# Patient Record
Sex: Female | Born: 1950 | Hispanic: Yes | Marital: Single | Smoking: Never smoker
Health system: Southern US, Community
[De-identification: ages and names within clinical notes are randomized; demographics above are authoritative.]

## PROBLEM LIST (undated history)

## (undated) DIAGNOSIS — E119 Type 2 diabetes mellitus without complications: Secondary | ICD-10-CM

## (undated) DIAGNOSIS — J9611 Chronic respiratory failure with hypoxia: Secondary | ICD-10-CM

## (undated) DIAGNOSIS — I776 Arteritis, unspecified: Secondary | ICD-10-CM

## (undated) DIAGNOSIS — I7782 Antineutrophilic cytoplasmic antibody (ANCA) vasculitis: Secondary | ICD-10-CM

## (undated) DIAGNOSIS — N189 Chronic kidney disease, unspecified: Secondary | ICD-10-CM

## (undated) HISTORY — DX: Chronic kidney disease, unspecified: N18.9

## (undated) HISTORY — DX: Arteritis, unspecified: I77.6

## (undated) HISTORY — DX: Antineutrophilic cytoplasmic antibody (ANCA) vasculitis: I77.82

## (undated) HISTORY — DX: Chronic respiratory failure with hypoxia: J96.11

## (undated) HISTORY — DX: Type 2 diabetes mellitus without complications: E11.9

---

## 2017-07-03 DIAGNOSIS — K625 Hemorrhage of anus and rectum: Secondary | ICD-10-CM | POA: Insufficient documentation

## 2017-07-03 DIAGNOSIS — K641 Second degree hemorrhoids: Secondary | ICD-10-CM

## 2017-07-03 HISTORY — DX: Second degree hemorrhoids: K64.1

## 2017-07-04 LAB — HM COLONOSCOPY

## 2018-02-09 DIAGNOSIS — R05 Cough: Secondary | ICD-10-CM | POA: Diagnosis not present

## 2018-03-06 DIAGNOSIS — Z9181 History of falling: Secondary | ICD-10-CM | POA: Diagnosis not present

## 2018-03-06 DIAGNOSIS — Z23 Encounter for immunization: Secondary | ICD-10-CM | POA: Diagnosis not present

## 2018-03-06 DIAGNOSIS — J019 Acute sinusitis, unspecified: Secondary | ICD-10-CM | POA: Diagnosis not present

## 2018-06-02 DIAGNOSIS — R188 Other ascites: Secondary | ICD-10-CM | POA: Diagnosis not present

## 2018-06-02 DIAGNOSIS — R10812 Left upper quadrant abdominal tenderness: Secondary | ICD-10-CM | POA: Diagnosis not present

## 2018-06-02 DIAGNOSIS — R10811 Right upper quadrant abdominal tenderness: Secondary | ICD-10-CM | POA: Diagnosis not present

## 2018-06-03 DIAGNOSIS — K760 Fatty (change of) liver, not elsewhere classified: Secondary | ICD-10-CM | POA: Diagnosis not present

## 2018-06-03 DIAGNOSIS — R188 Other ascites: Secondary | ICD-10-CM | POA: Diagnosis not present

## 2018-06-03 DIAGNOSIS — R109 Unspecified abdominal pain: Secondary | ICD-10-CM | POA: Diagnosis not present

## 2018-06-03 DIAGNOSIS — R10811 Right upper quadrant abdominal tenderness: Secondary | ICD-10-CM | POA: Diagnosis not present

## 2018-06-03 DIAGNOSIS — J90 Pleural effusion, not elsewhere classified: Secondary | ICD-10-CM | POA: Diagnosis not present

## 2018-06-03 DIAGNOSIS — R911 Solitary pulmonary nodule: Secondary | ICD-10-CM | POA: Diagnosis not present

## 2018-06-03 DIAGNOSIS — R10812 Left upper quadrant abdominal tenderness: Secondary | ICD-10-CM | POA: Diagnosis not present

## 2018-06-05 DIAGNOSIS — R109 Unspecified abdominal pain: Secondary | ICD-10-CM | POA: Diagnosis not present

## 2018-06-05 DIAGNOSIS — J90 Pleural effusion, not elsewhere classified: Secondary | ICD-10-CM | POA: Diagnosis not present

## 2018-06-05 DIAGNOSIS — R918 Other nonspecific abnormal finding of lung field: Secondary | ICD-10-CM | POA: Diagnosis not present

## 2018-06-05 DIAGNOSIS — R1084 Generalized abdominal pain: Secondary | ICD-10-CM | POA: Diagnosis not present

## 2018-06-05 DIAGNOSIS — R0789 Other chest pain: Secondary | ICD-10-CM | POA: Diagnosis not present

## 2018-06-05 DIAGNOSIS — D1809 Hemangioma of other sites: Secondary | ICD-10-CM | POA: Diagnosis not present

## 2018-06-05 DIAGNOSIS — R112 Nausea with vomiting, unspecified: Secondary | ICD-10-CM | POA: Diagnosis not present

## 2018-06-05 DIAGNOSIS — M546 Pain in thoracic spine: Secondary | ICD-10-CM | POA: Diagnosis not present

## 2018-06-05 DIAGNOSIS — R079 Chest pain, unspecified: Secondary | ICD-10-CM | POA: Diagnosis not present

## 2018-06-09 ENCOUNTER — Other Ambulatory Visit: Payer: Self-pay

## 2018-06-09 ENCOUNTER — Inpatient Hospital Stay (HOSPITAL_COMMUNITY)
Admission: AD | Admit: 2018-06-09 | Discharge: 2018-06-26 | DRG: 871 | Disposition: A | Discharge status: Home Health | Payer: Medicare HMO | Source: Other Acute Inpatient Hospital | Attending: Internal Medicine | Admitting: Internal Medicine

## 2018-06-09 ENCOUNTER — Encounter (HOSPITAL_COMMUNITY): Payer: Self-pay

## 2018-06-09 DIAGNOSIS — R609 Edema, unspecified: Secondary | ICD-10-CM | POA: Diagnosis not present

## 2018-06-09 DIAGNOSIS — R600 Localized edema: Secondary | ICD-10-CM

## 2018-06-09 DIAGNOSIS — R918 Other nonspecific abnormal finding of lung field: Secondary | ICD-10-CM | POA: Diagnosis not present

## 2018-06-09 DIAGNOSIS — J40 Bronchitis, not specified as acute or chronic: Secondary | ICD-10-CM | POA: Diagnosis not present

## 2018-06-09 DIAGNOSIS — K5900 Constipation, unspecified: Secondary | ICD-10-CM | POA: Diagnosis not present

## 2018-06-09 DIAGNOSIS — R52 Pain, unspecified: Secondary | ICD-10-CM | POA: Diagnosis not present

## 2018-06-09 DIAGNOSIS — Z880 Allergy status to penicillin: Secondary | ICD-10-CM

## 2018-06-09 DIAGNOSIS — Z6832 Body mass index (BMI) 32.0-32.9, adult: Secondary | ICD-10-CM | POA: Diagnosis not present

## 2018-06-09 DIAGNOSIS — K760 Fatty (change of) liver, not elsewhere classified: Secondary | ICD-10-CM | POA: Diagnosis present

## 2018-06-09 DIAGNOSIS — T502X5A Adverse effect of carbonic-anhydrase inhibitors, benzothiadiazides and other diuretics, initial encounter: Secondary | ICD-10-CM | POA: Diagnosis not present

## 2018-06-09 DIAGNOSIS — A419 Sepsis, unspecified organism: Principal | ICD-10-CM | POA: Diagnosis present

## 2018-06-09 DIAGNOSIS — I5031 Acute diastolic (congestive) heart failure: Secondary | ICD-10-CM | POA: Diagnosis not present

## 2018-06-09 DIAGNOSIS — K5730 Diverticulosis of large intestine without perforation or abscess without bleeding: Secondary | ICD-10-CM | POA: Diagnosis not present

## 2018-06-09 DIAGNOSIS — J181 Lobar pneumonia, unspecified organism: Secondary | ICD-10-CM | POA: Diagnosis present

## 2018-06-09 DIAGNOSIS — R590 Localized enlarged lymph nodes: Secondary | ICD-10-CM | POA: Diagnosis not present

## 2018-06-09 DIAGNOSIS — M5489 Other dorsalgia: Secondary | ICD-10-CM | POA: Diagnosis not present

## 2018-06-09 DIAGNOSIS — R140 Abdominal distension (gaseous): Secondary | ICD-10-CM | POA: Diagnosis not present

## 2018-06-09 DIAGNOSIS — J9601 Acute respiratory failure with hypoxia: Secondary | ICD-10-CM | POA: Diagnosis present

## 2018-06-09 DIAGNOSIS — R509 Fever, unspecified: Secondary | ICD-10-CM | POA: Diagnosis not present

## 2018-06-09 DIAGNOSIS — R911 Solitary pulmonary nodule: Secondary | ICD-10-CM | POA: Diagnosis present

## 2018-06-09 DIAGNOSIS — R042 Hemoptysis: Secondary | ICD-10-CM | POA: Diagnosis not present

## 2018-06-09 DIAGNOSIS — R51 Headache: Secondary | ICD-10-CM | POA: Diagnosis not present

## 2018-06-09 DIAGNOSIS — R21 Rash and other nonspecific skin eruption: Secondary | ICD-10-CM | POA: Diagnosis not present

## 2018-06-09 DIAGNOSIS — R222 Localized swelling, mass and lump, trunk: Secondary | ICD-10-CM

## 2018-06-09 DIAGNOSIS — E6609 Other obesity due to excess calories: Secondary | ICD-10-CM | POA: Diagnosis not present

## 2018-06-09 DIAGNOSIS — R0602 Shortness of breath: Secondary | ICD-10-CM | POA: Diagnosis not present

## 2018-06-09 DIAGNOSIS — G062 Extradural and subdural abscess, unspecified: Secondary | ICD-10-CM | POA: Diagnosis not present

## 2018-06-09 DIAGNOSIS — G959 Disease of spinal cord, unspecified: Secondary | ICD-10-CM | POA: Diagnosis not present

## 2018-06-09 DIAGNOSIS — M549 Dorsalgia, unspecified: Secondary | ICD-10-CM | POA: Diagnosis not present

## 2018-06-09 DIAGNOSIS — E669 Obesity, unspecified: Secondary | ICD-10-CM | POA: Diagnosis not present

## 2018-06-09 DIAGNOSIS — Z419 Encounter for procedure for purposes other than remedying health state, unspecified: Secondary | ICD-10-CM

## 2018-06-09 DIAGNOSIS — J209 Acute bronchitis, unspecified: Secondary | ICD-10-CM | POA: Diagnosis not present

## 2018-06-09 DIAGNOSIS — R0902 Hypoxemia: Secondary | ICD-10-CM | POA: Diagnosis not present

## 2018-06-09 DIAGNOSIS — R238 Other skin changes: Secondary | ICD-10-CM | POA: Diagnosis not present

## 2018-06-09 DIAGNOSIS — E079 Disorder of thyroid, unspecified: Secondary | ICD-10-CM | POA: Diagnosis not present

## 2018-06-09 DIAGNOSIS — Z789 Other specified health status: Secondary | ICD-10-CM | POA: Diagnosis not present

## 2018-06-09 DIAGNOSIS — K529 Noninfective gastroenteritis and colitis, unspecified: Secondary | ICD-10-CM | POA: Diagnosis not present

## 2018-06-09 DIAGNOSIS — R0600 Dyspnea, unspecified: Secondary | ICD-10-CM | POA: Diagnosis not present

## 2018-06-09 DIAGNOSIS — R197 Diarrhea, unspecified: Secondary | ICD-10-CM | POA: Diagnosis not present

## 2018-06-09 DIAGNOSIS — R6520 Severe sepsis without septic shock: Secondary | ICD-10-CM | POA: Diagnosis not present

## 2018-06-09 DIAGNOSIS — R740 Nonspecific elevation of levels of transaminase and lactic acid dehydrogenase [LDH]: Secondary | ICD-10-CM | POA: Diagnosis not present

## 2018-06-09 DIAGNOSIS — R1030 Lower abdominal pain, unspecified: Secondary | ICD-10-CM | POA: Diagnosis not present

## 2018-06-09 DIAGNOSIS — R011 Cardiac murmur, unspecified: Secondary | ICD-10-CM | POA: Diagnosis not present

## 2018-06-09 DIAGNOSIS — B371 Pulmonary candidiasis: Secondary | ICD-10-CM | POA: Diagnosis not present

## 2018-06-09 DIAGNOSIS — E873 Alkalosis: Secondary | ICD-10-CM | POA: Diagnosis not present

## 2018-06-09 DIAGNOSIS — G061 Intraspinal abscess and granuloma: Secondary | ICD-10-CM | POA: Diagnosis not present

## 2018-06-09 DIAGNOSIS — J918 Pleural effusion in other conditions classified elsewhere: Secondary | ICD-10-CM | POA: Diagnosis not present

## 2018-06-09 DIAGNOSIS — J9611 Chronic respiratory failure with hypoxia: Secondary | ICD-10-CM

## 2018-06-09 DIAGNOSIS — I5022 Chronic systolic (congestive) heart failure: Secondary | ICD-10-CM

## 2018-06-09 DIAGNOSIS — D72829 Elevated white blood cell count, unspecified: Secondary | ICD-10-CM | POA: Diagnosis not present

## 2018-06-09 DIAGNOSIS — R109 Unspecified abdominal pain: Secondary | ICD-10-CM

## 2018-06-09 DIAGNOSIS — R079 Chest pain, unspecified: Secondary | ICD-10-CM | POA: Diagnosis not present

## 2018-06-09 DIAGNOSIS — R846 Abnormal cytological findings in specimens from respiratory organs and thorax: Secondary | ICD-10-CM | POA: Diagnosis not present

## 2018-06-09 LAB — COMPREHENSIVE METABOLIC PANEL
ALT: 114 U/L — ABNORMAL HIGH (ref 0–44)
AST: 47 U/L — ABNORMAL HIGH (ref 15–41)
Albumin: 2.6 g/dL — ABNORMAL LOW (ref 3.5–5.0)
Alkaline Phosphatase: 126 U/L (ref 38–126)
Anion gap: 8 (ref 5–15)
BUN: <5 mg/dL — ABNORMAL LOW (ref 8–23)
CO2: 25 mmol/L (ref 22–32)
Calcium: 8.5 mg/dL — ABNORMAL LOW (ref 8.9–10.3)
Chloride: 102 mmol/L (ref 98–111)
Creatinine, Ser: 0.54 mg/dL (ref 0.44–1.00)
GFR calc Af Amer: >60 mL/min (ref >60)
GFR calc non Af Amer: >60 mL/min (ref >60)
Glucose, Bld: 150 mg/dL — ABNORMAL HIGH (ref 70–99)
Potassium: 3.9 mmol/L (ref 3.5–5.1)
Sodium: 135 mmol/L (ref 135–145)
Total Bilirubin: 0.4 mg/dL (ref 0.3–1.2)
Total Protein: 6.7 g/dL (ref 6.5–8.1)

## 2018-06-09 LAB — MRSA PCR SCREENING: MRSA by PCR: NEGATIVE

## 2018-06-09 MED ORDER — ZOLPIDEM TARTRATE 5 MG PO TABS
5.0000 mg | ORAL_TABLET | Freq: Every evening | ORAL | Status: DC | PRN
  Administered 2018-06-14 – 2018-06-17 (×2): 5 mg via ORAL
  Filled 2018-06-09 (×3): qty 1

## 2018-06-09 MED ORDER — ACETAMINOPHEN 325 MG PO TABS
650.0000 mg | ORAL_TABLET | Freq: Four times a day (QID) | ORAL | Status: DC | PRN
  Administered 2018-06-09 – 2018-06-22 (×8): 650 mg via ORAL
  Filled 2018-06-09 (×9): qty 2

## 2018-06-09 MED ORDER — LACTATED RINGERS IV SOLN
INTRAVENOUS | Status: DC
  Administered 2018-06-09 – 2018-06-12 (×4): via INTRAVENOUS

## 2018-06-09 MED ORDER — DOCUSATE SODIUM 100 MG PO CAPS
100.0000 mg | ORAL_CAPSULE | Freq: Two times a day (BID) | ORAL | Status: DC
  Administered 2018-06-09: 100 mg via ORAL
  Filled 2018-06-09: qty 1

## 2018-06-09 MED ORDER — SODIUM CHLORIDE 0.9% FLUSH
3.0000 mL | Freq: Two times a day (BID) | INTRAVENOUS | Status: DC
  Administered 2018-06-09 – 2018-06-26 (×32): 3 mL via INTRAVENOUS

## 2018-06-09 MED ORDER — ONDANSETRON HCL 4 MG/2ML IJ SOLN
4.0000 mg | Freq: Four times a day (QID) | INTRAMUSCULAR | Status: DC | PRN
  Administered 2018-06-10 – 2018-06-21 (×5): 4 mg via INTRAVENOUS
  Filled 2018-06-09 (×5): qty 2

## 2018-06-09 MED ORDER — ACETAMINOPHEN 650 MG RE SUPP: 650.0000 mg | Freq: Four times a day (QID) | RECTAL | Status: DC | PRN

## 2018-06-09 MED ORDER — ONDANSETRON HCL 4 MG PO TABS
4.0000 mg | ORAL_TABLET | Freq: Four times a day (QID) | ORAL | Status: DC | PRN
  Filled 2018-06-09: qty 1

## 2018-06-09 MED ORDER — OXYCODONE HCL 5 MG PO TABS
5.0000 mg | ORAL_TABLET | ORAL | Status: DC | PRN
  Administered 2018-06-09 – 2018-06-21 (×22): 5 mg via ORAL
  Filled 2018-06-09 (×25): qty 1

## 2018-06-09 NOTE — Progress Notes (Signed)
Dr. Kennon Holter on call for [facility] and text sent to see if a CMP can be done tonight because it is needed for the MRI.

## 2018-06-09 NOTE — H&P (Signed)
History and Physical    Julia Johns GYB:[id number] DOB: December 10, 1950 DOA: 06/09/2018  PCP: No primary care provider on file.  Patient coming from:   Home; NOK: Margarita Grizzle, [phone]  Chief Complaint: SOB  HPI: Julia Johns is a 68 y.o. female without significant medical history presenting with SOB, blood-tinged sputum, CP as a transfer from [facility].   She saw her PCP on last Monday.  She presented to [facility] 4 days ago for these symptoms and evaluation including MRI of the T-spine showed a paraspinal soft tissue thickening T6-10 concerning for malignancy.  Teleneurology was consulted and the patient was recommended to be admitted with plan for consults at that time with ortho, neurosurgery, and ID; MRI spine; PET scan.  The patient declined to stay and requested outpatient f/u.  Now with worsening SOB, does not feel like she can manage this outpatient and so she returned to [facility].  Vitals 89% on RA, improved to 93% on 2L [place] O2.  PE unremarkable at [facility].  Labs unremarkable other than CRP 180 (normal <10 on their scale).  Pre-BNP 142 -> 981.  Negative troponin, stable EKG.  CTA negative for PE but "prevertebral densities" - malignancy vs. infection - and LLL density that is 4.5 x 2.9 cm concerning for mass with surrounding infiltrate.  Hospitalist at [facility] was concerned about blood-tinged sputum and recommended transfer to [facility].  Repeat COVID test negative (initial was also negative).  The patient reports significant pain in the left thoracic back region and midsternal chest tightness.  She has SOB and cough with hemoptysis.  No apparent fever.  No tobacco history.   Review of Systems: As per HPI; otherwise review of systems reviewed and negative.   Ambulatory Status:  Ambulates without assistance  History reviewed. No pertinent past medical history.  History reviewed. No pertinent surgical history.  Social History   Socioeconomic History  . Marital status: Unknown    Spouse  name: Not on file  . Number of children: Not on file  . Years of education: Not on file  . Highest education level: Not on file  Occupational History  . Not on file  Social Needs  . Financial resource strain: Not on file  . Food insecurity:    Worry: Not on file    Inability: Not on file  . Transportation needs:    Medical: Not on file    Non-medical: Not on file  Tobacco Use  . Smoking status: Never Smoker  . Smokeless tobacco: Never Used  Substance and Sexual Activity  . Alcohol use: Never    Frequency: Never  . Drug use: Never  . Sexual activity: Not on file  Lifestyle  . Physical activity:    Days per week: Not on file    Minutes per session: Not on file  . Stress: Not on file  Relationships  . Social connections:    Talks on phone: Not on file    Gets together: Not on file    Attends religious service: Not on file    Active member of club or organization: Not on file    Attends meetings of clubs or organizations: Not on file    Relationship status: Not on file  . Intimate partner violence:    Fear of current or ex partner: Not on file    Emotionally abused: Not on file    Physically abused: Not on file    Forced sexual activity: Not on file  Other Topics Concern  .  Not on file  Social History Narrative  . Not on file    Allergies  Allergen Reactions  . Penicillins     History reviewed. No pertinent family history.  Prior to Admission medications   Not on File    Physical Exam: Vitals:   06/09/18 1545 06/09/18 1727  BP:  91/63  Pulse:  100  Resp:  20  Temp:  (!) 101.1 F (38.4 C)  TempSrc:  Axillary  SpO2:  90%  Weight: 93.1 kg   Height: 5\' 7"  (1.702 m)      . General:  Appears calm abut uncomfortable; she is obese . Eyes:   EOMI, normal lids, iris . ENT:  grossly normal hearing, lips & tongue, mmm . Neck:  no LAD, masses or thyromegaly . Cardiovascular:  RRR, no m/r/g. No LE edema.  Marland Kitchen Respiratory:   CTA bilaterally with no  wheezes/rales/rhonchi.  Normal respiratory effort. . Abdomen:  soft, NT, ND, NABS . Back:   normal alignment, no CVAT, no apparent deformity . Skin:  no rash or induration seen on limited exam . Musculoskeletal:  grossly normal tone BUE/BLE, good ROM, no bony abnormality . Psychiatric:  grossly normal mood and affect, speech fluent and appropriate, AOx3 . Neurologic:  CN 2-12 grossly intact, moves all extremities in coordinated fashion, sensation intact    Radiological Exams on Admission: No results found.  EKG: Independently reviewed.  NSR with no evidence of acute ischemia   Labs on Admission: I have personally reviewed the available labs and imaging studies at the time of the admission.  Pertinent labs from [facility]:   WBC 9.4 Hgb 12.2 Plt 405 Normal BMP Glucose 141 AST 88/ALT 172 INR 1.2 COVID negative x 2 CRP 180 (roughly 18 compared to our testing here) Pro-BNP 142 -> 981   Assessment/Plan Principal Problem:   Paravertebral mass Active Problems:   Mass of lower lobe of left lung    -Patient without known PMH or smoking history but who is Hispanic presenting with SOB, hemoptysis, and chest and back pain -Imaging showed a paravertebral mass from T6-10 which is concerning for malignancy vs. Infection (TB is particularly of concern) -She also has a 4.5 x 2.9 cm LLL density concerning for mass vs. Infection. -I personally reviewed the images from [facility] the MRI thoracic spine with Dr. Rory Percy from neurology - the paravertebral inflammation appears to be outside of spine itself.  Infection, malignancy are reasonable differentials.  The lesion appears to overlie the T6-10 ligament anterior to the vertebral column and may also surround the aorta.  There is also a clear lesion in the LLL that is concerning for malignancy vs. Infection - including TB. -I then spoke with Dr. Roxan Hockey from [place], who will see the patient.  However, he recommends that neurosurgery also be involved.  He  also suggested that ID might be a reasonable consult. -Next I spoke with NP Reinaldo Meeker and Dr. Trenton Gammon from neurosurgery Dr. Trenton Gammon said if there is not spinal canal involvement then it may be CT surgery or ID primarily.  He has reviewed the images and she does not need neurosurgery involvement at this time because there is no direct spinal involvement.   -I then called Dr. Tommy Medal from ID.  She will obviously need a biopsy - probably not tonight, likely by IR .  No antibiotics for now, blood cultures pending.  Sounds like malignancy vs. TB vs. Lung abscess. Quantiferon-gold and HIV can be added. -For now, will admit to  SDU for ongoing close monitoring. -Airborne precautions for TB for now. -Will place IR consult request. -Will hold antibiotics for now, even despite fever - need to obtain cultures first, if possible -MR C-spine and L-spine (T-spine was already done at [facility]) -Pain control with Oxy IR for now -Fever control with Tylenol  -Blood cultures pending     Note: This patient has been tested twice and is negative for the novel coronavirus COVID-19.  DVT prophylaxis:  SCDs Code Status:  Full  Family Communication: None present; I spoke with her son and sister-in-law by telephone  Disposition Plan:  Home once clinically improved Consults called: Neurology and neurosurgery by telephone; thoracic surgery, ID, and IR  Admission status: Admit - It is my clinical opinion that admission to INPATIENT is reasonable and necessary because of the expectation that this patient will require hospital care that crosses at least 2 midnights to treat this condition based on the medical complexity of the problems presented.  Given the aforementioned information, the predictability of an adverse outcome is felt to be significant.    Karmen Bongo MD [facility]   How to contact the [facility] Attending or Consulting provider [place] or covering provider during after hours [place], for this patient?  1. Check  the care team in [facility] and look for a) attending/consulting TRH provider listed and b) the [facility] team listed 2. Log into [email or web address] and use [place]'s universal password to access. If you do not have the password, please contact the hospital operator. 3. Locate the [facility] provider you are looking for under [facility] and page to a number that you can be directly reached. 4. If you still have difficulty reaching the provider, please page the [facility] (Director on Call) for the Hospitalists listed on amion for assistance.   06/09/2018, 5:45 PM

## 2018-06-09 NOTE — Consult Note (Signed)
Reason for Consult:paraspinal "mass" Referring Physician: Dr. Drucie Opitz Julia Johns is an 68 y.o. female.  HPI: 68 yo [name] presented to [place] on 5/15 with shortness of breath and hemoptysis. First became ill about 2 weeks before that. She had CT of the abdomen and chest and MR of the T spine. Found to have a prevertebral soft tissue density from t6-10 and a LLL lesion. Concerned for infection v malignancy. Refused admission. Today returned with worsening shortness of breath, and pain in left posterior chest wall. Noted to be febrile. Repeat CT showed no significant change in the paraspinous process but the left lower lobe "mass" was larger. Covid negative.   History reviewed. No pertinent past medical history.  History reviewed. No pertinent surgical history.  History reviewed. No pertinent family history.  Social History:  reports that she has never smoked. She has never used smokeless tobacco. She reports that she does not drink alcohol or use drugs.  Allergies:  Allergies  Allergen Reactions  . Penicillins     Medications:  Scheduled: . docusate sodium  100 mg Oral BID  . sodium chloride flush  3 mL Intravenous Q12H    Results for orders placed or performed during the hospital encounter of 06/09/18 (from the past 48 hour(s))  MRSA PCR Screening     Status: None   Collection Time: 06/09/18  3:45 PM  Result Value Ref Range   MRSA by PCR NEGATIVE NEGATIVE    Comment:        The GeneXpert MRSA Assay (FDA approved for NASAL specimens only), is one component of a comprehensive MRSA colonization surveillance program. It is not intended to diagnose MRSA infection nor to guide or monitor treatment for MRSA infections. Performed at [facility], [place] [address], [place], [place] [address]     No results found.  Review of Systems  Constitutional: Positive for fever and malaise/fatigue.  Respiratory: Positive for cough, hemoptysis and shortness of  breath.   Cardiovascular: Positive for chest pain.  Musculoskeletal: Positive for back pain (left lower chest).   Blood pressure 91/63, pulse 100, temperature (!) 101.1 F (38.4 C), temperature source Axillary, resp. rate 20, height 5\' 7"  (1.702 m), weight 93.1 kg, SpO2 90 %. Physical Exam  Vitals reviewed. Constitutional: She is oriented to person, place, and time.  Obese, anxious, no acute distress  HENT:  Head: Normocephalic and atraumatic.  Eyes: Conjunctivae and EOM are normal. No scleral icterus.  Neck: No tracheal deviation present.  Cardiovascular: Normal rate, regular rhythm and normal heart sounds.  Respiratory: Effort normal and breath sounds normal. No respiratory distress. She exhibits tenderness (left posterior).  GI: Soft. There is abdominal tenderness.  Neurological: She is alert and oriented to person, place, and time. No cranial nerve deficit.  Skin: Skin is warm and dry.    Assessment/Plan: 68 yo Hispanic woman presents with shortness of breath, hemoptysis and left sided posterior chest wall pain. She was evaluated at [place] 4 days ago but she declined admission. Today her shortness of breath worsened and she was transferred to [facility]. She also has a documented fever on admission.  I reviewed CT Abdomen/pelvis and chest from 5/15, MR from 5/15 and CT from 5/19 with Radiology. Reviewed reports as well, although they are not available in Epic currently. There is a soft tissue density mass v phlegmon along the anterior spine from T6-T10. The aorta is adjacent to this anatomically but is normal in appearance. There is no reason the  suspect the aorta is "involved." There is a left lower lobe lesion/ possible abscess that has increased in size significantly over the 4 days between CT scans.  Increased size of LLL lesion over a 4 day period strongly suggests infectious etiology. TB is a consideration and Quantiferon is pending.   There is no thoracic surgical issue currently.  Paravertebral findings are indeterminate for mass vs phlegmon, but there is not an abscess. That could potentially be approached by IR if necessary, but would not be the first option.   Could consider Pulmonology consult for possible bronch if Quantiferon doesn't provide an answer.  Melrose Nakayama 06/09/2018, 6:11 PM

## 2018-06-09 NOTE — Progress Notes (Signed)
Notified from MRI wanting to know how long the results take for the Quantative Feron Gold Study (TB test) and need for a CMP (specifically a Creat and GFR)

## 2018-06-09 NOTE — Progress Notes (Signed)
Dr.Blount wrote orders for CMP now. MRI notified.

## 2018-06-09 NOTE — Progress Notes (Signed)
MRI notified TB test is a send out so results not for a couple of days and that I was getting ready to text the doctor about the CMP

## 2018-06-10 ENCOUNTER — Inpatient Hospital Stay (HOSPITAL_COMMUNITY): Payer: Medicare HMO

## 2018-06-10 DIAGNOSIS — G062 Extradural and subdural abscess, unspecified: Secondary | ICD-10-CM

## 2018-06-10 DIAGNOSIS — Z6832 Body mass index (BMI) 32.0-32.9, adult: Secondary | ICD-10-CM

## 2018-06-10 DIAGNOSIS — R222 Localized swelling, mass and lump, trunk: Secondary | ICD-10-CM

## 2018-06-10 DIAGNOSIS — Z789 Other specified health status: Secondary | ICD-10-CM

## 2018-06-10 DIAGNOSIS — R918 Other nonspecific abnormal finding of lung field: Secondary | ICD-10-CM

## 2018-06-10 DIAGNOSIS — R079 Chest pain, unspecified: Secondary | ICD-10-CM

## 2018-06-10 DIAGNOSIS — R6520 Severe sepsis without septic shock: Secondary | ICD-10-CM

## 2018-06-10 DIAGNOSIS — E6609 Other obesity due to excess calories: Secondary | ICD-10-CM

## 2018-06-10 DIAGNOSIS — J181 Lobar pneumonia, unspecified organism: Secondary | ICD-10-CM

## 2018-06-10 DIAGNOSIS — J9601 Acute respiratory failure with hypoxia: Secondary | ICD-10-CM

## 2018-06-10 DIAGNOSIS — A419 Sepsis, unspecified organism: Principal | ICD-10-CM

## 2018-06-10 DIAGNOSIS — Z880 Allergy status to penicillin: Secondary | ICD-10-CM

## 2018-06-10 LAB — CBC WITH DIFFERENTIAL/PLATELET
Abs Immature Granulocytes: 0.07 10*3/uL (ref 0.00–0.07)
Basophils Absolute: 0 10*3/uL (ref 0.0–0.1)
Basophils Relative: 0 %
Eosinophils Absolute: 0.2 10*3/uL (ref 0.0–0.5)
Eosinophils Relative: 2 %
HCT: 38.2 % (ref 36.0–46.0)
Hemoglobin: 12.3 g/dL (ref 12.0–15.0)
Immature Granulocytes: 1 %
Lymphocytes Relative: 7 %
Lymphs Abs: 0.9 10*3/uL (ref 0.7–4.0)
MCH: 28.5 pg (ref 26.0–34.0)
MCHC: 32.2 g/dL (ref 30.0–36.0)
MCV: 88.4 fL (ref 80.0–100.0)
Monocytes Absolute: 0.5 10*3/uL (ref 0.1–1.0)
Monocytes Relative: 4 %
Neutro Abs: 10.5 10*3/uL — ABNORMAL HIGH (ref 1.7–7.7)
Neutrophils Relative %: 86 %
Platelets: 454 10*3/uL — ABNORMAL HIGH (ref 150–400)
RBC: 4.32 MIL/uL (ref 3.87–5.11)
RDW: 13.9 % (ref 11.5–15.5)
WBC: 12.2 10*3/uL — ABNORMAL HIGH (ref 4.0–10.5)
nRBC: 0 % (ref 0.0–0.2)

## 2018-06-10 LAB — HIV ANTIBODY (ROUTINE TESTING W REFLEX): HIV Screen 4th Generation wRfx: NONREACTIVE

## 2018-06-10 MED ORDER — SIMETHICONE 80 MG PO CHEW
80.0000 mg | CHEWABLE_TABLET | Freq: Four times a day (QID) | ORAL | Status: DC | PRN
  Administered 2018-06-10 – 2018-06-21 (×4): 80 mg via ORAL
  Filled 2018-06-10 (×4): qty 1

## 2018-06-10 MED ORDER — MORPHINE SULFATE (PF) 2 MG/ML IV SOLN
2.0000 mg | Freq: Once | INTRAVENOUS | Status: AC
  Administered 2018-06-10: 2 mg via INTRAVENOUS
  Filled 2018-06-10: qty 1

## 2018-06-10 MED ORDER — SENNOSIDES-DOCUSATE SODIUM 8.6-50 MG PO TABS
1.0000 | ORAL_TABLET | Freq: Two times a day (BID) | ORAL | Status: DC
  Administered 2018-06-10 – 2018-06-15 (×9): 1 via ORAL
  Filled 2018-06-10 (×10): qty 1

## 2018-06-10 MED ORDER — SODIUM CHLORIDE 0.9 % IV SOLN
1.0000 g | Freq: Three times a day (TID) | INTRAVENOUS | Status: DC
  Administered 2018-06-10 – 2018-06-20 (×31): 1 g via INTRAVENOUS
  Filled 2018-06-10 (×33): qty 1

## 2018-06-10 NOTE — Progress Notes (Signed)
Sent text to Dr.Blount earlier (approx 10 min ago) for pain meds since pt is NPO for BX today. Received order for Morphine 2mg  IV which was given at this time. Pt sitting up in a chair with call light at her side. Pt says she can't lay down r/t severe pain in her back. O2 at 2 L/M [place] intact, IVF resumed at 75 ml/hr, and pt connect to [facility] monitor. Tele notified earlier that pt was going back on the monitor and off tele and she was back in her room.

## 2018-06-10 NOTE — Consult Note (Signed)
[name] for Infectious Disease    Date of Admission:  06/09/2018   Total days of antibiotics 0               Reason for Consult: Pulmonary mass, suspect infectious etiology    Referring Provider: Dr. Berle Mull Primary Care Provider: N/A  Assessment: Given the history and imaging findings with the rapid increase in size over the interval demonstrated on CT/MRI chest, the mass is concerning for an infectious process. There is marked inflammation of the perivertebral pleura in addition to the mass. IR is unable to obtain a sample. Pulmonology has been consulted as per the primary team to obtain a sample. We appreciate their assistance with obtaining a tissue sample/culture. The patient remains febrile, tachycardic with soft BP requiring supplemental O2 at 3L via [place] to maintain O2 sats >92%. The patient endorses a severe pen allergy, unable to confirm. Given her recent treatment with ciprofloxacin, age and pen allergy we have opted for meropenum as treatment.  Blood cultures are pending from admission. I contacted [facility] and faxed a records request for the blood cultures to [phone], awaiting results in the [facility] inpatient office.   Plan: 1. AFB sputum smear/culture ordered 2. Meropenum empirically  3. Cultures pending   Principal Problem:   Mass of lower lobe of left lung Active Problems:   Pneumonia   Paravertebral mass   Scheduled Meds: . docusate sodium  100 mg Oral BID  . sodium chloride flush  3 mL Intravenous Q12H   Continuous Infusions: . lactated ringers 75 mL/hr at 06/10/18 0726  . meropenem (MERREM) IV     PRN Meds:.acetaminophen **OR** acetaminophen, ondansetron **OR** ondansetron (ZOFRAN) IV, oxyCODONE, zolpidem  A spanish interpreter was used in obtaining this history.   HPI: Julia Johns is a 68 y.o. female absent a notable PMHx who presented as a transfer from [facility] due to the presenting issues of SOB, blood  tinged sputum and central chest pain. Her symptoms began on Friday with a productive cough with the bloody sputum worsening the evening of 05/19. She initially visited the [facility] ER on the 15th for these symptoms where MRI spine indicated a paraspinal soft tissue thickening from T6-10 concerning for malignancy. Admission was advised but patient opted to return home. She returned again when her symptoms worsened and the pain became unbearable. On admission she was experiencing dyspnea with increased oxygen demand, negative troponins and EKG. CT was ordered 2/2 to concern for PE, which revealed enlargement of the LLL density and persistent prevertebral densities. COVID was reported as negative.  The patient denies recent travel after moving from [place] this past September. She does not recall any recent international travel.  She is a nonsmoker. Denied weight loss admitting to weight gain. Endorses a family history of cancers only, unspecified.   Review of Systems: Review of Systems  Constitutional: Positive for fever. Negative for chills and diaphoresis.  HENT: Negative for ear pain and sinus pain.   Respiratory: Negative for cough and shortness of breath.   Cardiovascular: Positive for chest pain ([place] since 06/05/18) and leg swelling.  Gastrointestinal: Negative for constipation, diarrhea, nausea and vomiting.  Genitourinary: Negative for flank pain and urgency.  Musculoskeletal: Negative for myalgias.  Skin: Negative for itching and rash.  Neurological: Negative for dizziness and headaches.  Psychiatric/Behavioral: Negative for depression. The patient is not nervous/anxious.     History reviewed. No pertinent past medical history.  Social  History   Tobacco Use  . Smoking status: Never Smoker  . Smokeless tobacco: Never Used  Substance Use Topics  . Alcohol use: Never    Frequency: Never  . Drug use: Never    History reviewed. No pertinent family history. Allergies  Allergen  Reactions  . Penicillins Anaphylaxis    Patient stated that within a few minutes of receiving a penicillin IM injection she rapidly became unconscious and required "additional" medication to recover. This occurred when she was ~68 years old and she does not recall what was given to assist in her recovery. She did not develop a rash.     OBJECTIVE: Blood pressure 101/78, pulse 87, temperature 100 F (37.8 C), temperature source Oral, resp. rate 18, height 5\' 7"  (1.702 m), weight 93.1 kg, SpO2 93 %.  Physical Exam Constitutional:      General: She is not in acute distress.    Appearance: She is well-developed. She is not diaphoretic.  HENT:     Head: Normocephalic and atraumatic.  Neck:     Musculoskeletal: Normal range of motion and neck supple.  Cardiovascular:     Rate and Rhythm: Normal rate and regular rhythm.     Heart sounds: No murmur.  Pulmonary:     Effort: Pulmonary effort is normal. No respiratory distress.     Breath sounds: Normal breath sounds. No rhonchi.  Abdominal:     General: Bowel sounds are normal. There is no distension.     Palpations: Abdomen is soft.     Tenderness: There is no abdominal tenderness.  Skin:    General: Skin is warm.     Capillary Refill: Capillary refill takes less than 2 seconds.  Neurological:     Mental Status: She is alert and oriented to person, place, and time.     Lab Results Lab Results  Component Value Date   WBC 12.2 (H) 06/10/2018   HGB 12.3 06/10/2018   HCT 38.2 06/10/2018   MCV 88.4 06/10/2018   PLT 454 (H) 06/10/2018    Lab Results  Component Value Date   CREATININE 0.54 06/09/2018   BUN <5 (L) 06/09/2018   NA 135 06/09/2018   K 3.9 06/09/2018   CL 102 06/09/2018   CO2 25 06/09/2018    Lab Results  Component Value Date   ALT 114 (H) 06/09/2018   AST 47 (H) 06/09/2018   ALKPHOS 126 06/09/2018   BILITOT 0.4 06/09/2018     Microbiology: Recent Results (from the past 240 hour(s))  MRSA PCR Screening      Status: None   Collection Time: 06/09/18  3:45 PM  Result Value Ref Range Status   MRSA by PCR NEGATIVE NEGATIVE Final    Comment:        The GeneXpert MRSA Assay (FDA approved for NASAL specimens only), is one component of a comprehensive MRSA colonization surveillance program. It is not intended to diagnose MRSA infection nor to guide or monitor treatment for MRSA infections. Performed at [facility], [place] [address], [place], [place] [address]     Kathi Ludwig, [place] [facility] [place] [phone] pager   [phone] cell 06/10/2018, 11:40 AM

## 2018-06-10 NOTE — Progress Notes (Signed)
NAME:  Julia Johns, MRN:  [id number], DOB:  [birth year], LOS: 1 ADMISSION DATE:  06/09/2018, CONSULTATION DATE:  06/10/2018  REFERRING MD:  Dr. Posey Pronto, CHIEF COMPLAINT:  Fever, abnormal CT    Brief History   This is a 68 year old female who presented as a transfer from [facility] with complaints of shortness of breath and blood-tinged sputum.  Patient underwent CT imaging which revealed a paraspinal soft tissue thickening from T6-T10 as well as a left lower lobe density within the CT which was enlarged from prior imaging concerning for an inflammatory/infectious etiology.  Patient was found to be febrile.  Patient was admitted to [facility] under airborne isolation, COVID negative, TB rule out pending, QuantiFERON pending.  Pulmonary was consulted for recommendations and management of the left lower lobe density.  History of present illness   68 year old female, she speaks Spanish however does seem to understand and speak English to obtain history.  She, as a transfer from [facility] presenting with hemoptysis chest pain and shortness of breath.  CT imaging revealed an abnormality within the paraspinal soft tissue in which surgery was consulted as well as interventional radiology for potential biopsy.  Pulmonary was consulted for recommendations regarding left lower lobe density.  CT review reveals a left lower lobe rounded area of atelectasis versus infiltrate.  There could very well be a mass within this.  Patient is a lifelong non-smoker nonalcohol use.  She denies weight loss.  Denies exposure to tuberculosis.  Patient was admitted to [facility] in a airborne isolation room, COVID negative, plans for TB rule out, QuantiFERON pending.  Past Medical History  Obesity  Significant Hospital Events   06/09/2018: Admission  Consults:  PCCM Cardiothoracic surgery Interventional radiology  Procedures:  06/09/2018 CT chest [facility] health Left lower lobe density, areas of  atelectasis, thickening along the anterior vertebra noted The patient's images have been independently reviewed by me.    Significant Diagnostic Tests:    Micro Data:  QuantiFERON: Pending AFB: Pending Blood cultures: No growth to date MRSA PCR: Negative  Antimicrobials:  Meropenem  Interim history/subjective:  Patient states that she feels better than she did when she presented to the hospital.  She does not really know what is going on with her.  She states that she has been feeling poorly for the past 4 to 5 days prior to coming to the hospital.  Objective   Blood pressure 101/78, pulse 87, temperature 100 F (37.8 C), temperature source Oral, resp. rate 18, height 5\' 7"  (1.702 m), weight 93.1 kg, SpO2 93 %.        Intake/Output Summary (Last 24 hours) at 06/10/2018 1716 Last data filed at 06/10/2018 1537 Gross per 24 hour  Intake 1704.54 ml  Output 2 ml  Net 1702.54 ml   Filed Weights   06/09/18 1545  Weight: 93.1 kg    Examination: General: Morbidly obese, Hispanic, elderly female, sitting up in chair, no acute distress HENT: NCAT, sclera clear, tracking appropriately Lungs: Diminished left lower lobe base, no crackles, no wheeze, some rhonchi in the left anterior chest cleared with coughing Cardiovascular: Regular rate and rhythm, S1-S2, no MRG Abdomen: Obese pannus, soft, nontender Extremities: No significant lower extremity edema, radial DP present Neuro: Alert, oriented, following commands, no focal deficit GU: Deferred  Resolved Hospital Problem list     Assessment & Plan:   Acute hypoxemic respiratory failure requiring nasal cannula O2 supplementation Left lower lobe rounded density, in the setting of fever cough  sputum production, scant hemoptysis, leukocytosis Therefore most presumed diagnosis would be community-acquired pneumonia - I believe she has a low risk for this being TB - However at this time will need to complete rule out -We will start  with sputum cultures as well as AFB smear, per infectious disease - I believe the best course of action would be to treat her with antimicrobials as she is already continuing to improve.  If she does not clinically improve with a course of antimicrobials we can consider bronchoscopy to the left lower lobe for cultures -However at this time will defer bronchoscopy and plan for repeat imaging in 4 to 6 weeks to ensure resolution of left lower lobe density -I agree with Dr. Megan Salon and our ID colleagues.  If we are unable to obtain a diagnosis and the patient has no clinical improvement we can proceed with bronchoscopy for cultures -Also if there is no resolution of the density on imaging after treatment she will need bronchoscopy  Right upper lobe pulmonary nodule She will need follow-up for the small subcentimeter right upper lobe pulmonary nodule, follow-up for this will be noncontrasted CT 9 to 12 months.  Pulmonary will follow peripherally, pending culture results.  Please call us if the decision has been made that bronchoscopy is desired for further culture and evaluation.  Labs   CBC: Recent Labs  Lab 06/10/18 0619  WBC 12.2*  NEUTROABS 10.5*  HGB 12.3  HCT 38.2  MCV 88.4  PLT 454*    Basic Metabolic Panel: Recent Labs  Lab 06/09/18 2121  NA 135  K 3.9  CL 102  CO2 25  GLUCOSE 150*  BUN <5*  CREATININE 0.54  CALCIUM 8.5*   GFR: Estimated Creatinine Clearance: 78.8 mL/min (by C-G formula based on SCr of 0.54 mg/dL). Recent Labs  Lab 06/10/18 0619  WBC 12.2*    Liver Function Tests: Recent Labs  Lab 06/09/18 2121  AST 47*  ALT 114*  ALKPHOS 126  BILITOT 0.4  PROT 6.7  ALBUMIN 2.6*   No results for input(s): LIPASE, AMYLASE in the last 168 hours. No results for input(s): AMMONIA in the last 168 hours.  ABG No results found for: PHART, PCO2ART, PO2ART, HCO3, TCO2, ACIDBASEDEF, O2SAT   Coagulation Profile: No results for input(s): INR, PROTIME in the  last 168 hours.  Cardiac Enzymes: No results for input(s): CKTOTAL, CKMB, CKMBINDEX, TROPONINI in the last 168 hours.  HbA1C: No results found for: HGBA1C  CBG: No results for input(s): GLUCAP in the last 168 hours.  Review of Systems:   Review of Systems  Constitutional: Negative for chills, fever, malaise/fatigue and weight loss.  HENT: Negative for hearing loss, sore throat and tinnitus.   Eyes: Negative for blurred vision and double vision.  Respiratory: Positive for cough, hemoptysis, sputum production and shortness of breath. Negative for wheezing and stridor.   Cardiovascular: Positive for chest pain. Negative for palpitations, orthopnea, leg swelling and PND.  Gastrointestinal: Negative for abdominal pain, constipation, diarrhea, heartburn, nausea and vomiting.  Genitourinary: Negative for dysuria, hematuria and urgency.  Musculoskeletal: Positive for back pain. Negative for joint pain and myalgias.  Skin: Negative for itching and rash.  Neurological: Negative for dizziness, tingling, weakness and headaches.  Endo/Heme/Allergies: Negative for environmental allergies. Does not bruise/bleed easily.  Psychiatric/Behavioral: Negative for depression. The patient is not nervous/anxious and does not have insomnia.   All other systems reviewed and are negative.    Past Medical History  She,  has no past  medical history on file.   Surgical History   History reviewed. No pertinent surgical history.   Social History   reports that she has never smoked. She has never used smokeless tobacco. She reports that she does not drink alcohol or use drugs.   Family History   Her family history is not on file.   Allergies Allergies  Allergen Reactions  . Penicillins Anaphylaxis    Patient stated that within a few minutes of receiving a penicillin IM injection she rapidly became unconscious and required "additional" medication to recover. This occurred when she was ~68 years old and she  does not recall what was given to assist in her recovery. She did not develop a rash.      Home Medications  Prior to Admission medications   Medication Sig Start Date End Date Taking? Authorizing Provider  ondansetron (ZOFRAN-ODT) 4 MG disintegrating tablet Take 4 mg by mouth every 6 (six) hours as needed for nausea or vomiting.   Yes [provider]  oxyCODONE (OXY IR/ROXICODONE) 5 MG immediate release tablet Take 5 mg by mouth every 4 (four) hours as needed for severe pain.   Yes [provider]      Garner Nash, DO [facility] 06/10/2018 5:17 PM  Personal pager: [phone] If unanswered, please page CCM On-call: [phone]

## 2018-06-10 NOTE — Progress Notes (Signed)
Approx at this time pt left per w/c with oxygen 2 L/M [place] for MRI. Pt spitting up scant amts of bld tinged mucus all the way down to MRI

## 2018-06-10 NOTE — Progress Notes (Signed)
MRI notified of pt ready to come to MRI and she has an established 22g in LFA. Was informed that was a large enough IV for IV Dye. Informed MRI that they would need to bring oxygen with them and this nurse would be going down with pt. Also that everybody would need to be wearing N95 masks

## 2018-06-10 NOTE — Progress Notes (Signed)
MRI never called back to say it was ok for pt to come down for MRI

## 2018-06-10 NOTE — Progress Notes (Signed)
Returned from MRI. Only approx 1/3 of procedure done and none of the MRI with dye was done r/t anxiety and severe back pain. Procedure stopped and pt brought back to unit.

## 2018-06-10 NOTE — Progress Notes (Signed)
IR requested by Dr. Lorin Mercy for possible image-guided lung mass versus paravertebral mass biopsy.  Images have been reviewed by Dr. Anselm Pancoast who does not recommend biopsy of either mass at this time. Regarding the lung mass- recommend pulmonary consultation for possible bronchoscopy.  Regarding the paravertebral mass- recommend GI consultation for possible EUS of paravertebral mass. Will delete order. Dr. Posey Pronto aware.  IR available in future if needed.  Bea Graff Sweet Jarvis, PA-C 06/10/2018, 8:33 AM

## 2018-06-10 NOTE — Progress Notes (Signed)
Unable to establish IV x2 by 2 different nurses. IV Team Consulted.

## 2018-06-10 NOTE — Progress Notes (Signed)
Pt changed to Tele for transport to MRI. Tele Tech [place] notified pt on Tele box 11 and going to MRI and when she returns I will put her back on Step Down monitor and will let her know.

## 2018-06-10 NOTE — Progress Notes (Signed)
MRI called and stated they are ready for pt. Msg sent to Dr.Blount to see if pt can travel off monitor and without a nurse. New IV was established at 0242 by IV Team.

## 2018-06-10 NOTE — Progress Notes (Signed)
[facility] Progress Note  Patient: Julia Johns [id number]:[id number]   PCP: Patient, No Pcp Per DOB: [birth year]   DOA: 06/09/2018   DOS: 06/10/2018   Date of Service: the patient was seen and examined on 06/10/2018  Brief hospital course: Pt. with no significant PMH; admitted on 06/09/2018, presented with complaint of cough and fever, was found to have CAP. Currently further plan is continue IV Antibiotics.  Subjective: reports chest pain and abdominal pain, about the same. Also has constipation and gas pain.   Assessment and Plan: Acute hypoxemic respiratory failure requiring nasal cannula O2 supplementation Left lower lobe rounded density, in the setting of fever cough sputum production, scant hemoptysis, leukocytosis community-acquired pneumonia start with sputum cultures as well as AFB smear, per infectious disease PCCM consulted, recommended defer bronchoscopy and plan for repeat imaging in 4 to 6 weeks to ensure resolution of left lower lobe density if there is no resolution of the density on imaging after treatment she will need bronchoscopy  Right upper lobe pulmonary nodule She will need follow-up for the small subcentimeter right upper lobe pulmonary nodule, follow-up for this will be noncontrasted CT 9 to 12 months.  Diet: Cardiac diet DVT Prophylaxis: subcutaneous Heparin  Advance goals of care discussion: Full code  Family Communication: no family was present at bedside, at the time of interview.   Disposition:  Discharge to be determined .  Consultants: Neurology, neurosurgery, pulmonary, cardiothoracic surgery, infectious disease Procedures: none  Scheduled Meds: . docusate sodium  100 mg Oral BID  . sodium chloride flush  3 mL Intravenous Q12H   Continuous Infusions: . lactated ringers 75 mL/hr at 06/10/18 0726   PRN Meds: acetaminophen **OR** acetaminophen, ondansetron **OR** ondansetron (ZOFRAN) IV, oxyCODONE, zolpidem Antibiotics:  Anti-infectives (From admission, onward)   None       Objective: Physical Exam: Vitals:   06/10/18 0400 06/10/18 0401 06/10/18 0402 06/10/18 0821  BP: (!) 107/44   101/78  Pulse: 87     Resp: 18     Temp: 99.3 F (37.4 C)   100 F (37.8 C)  TempSrc: Oral   Oral  SpO2: 94% 94% 94% 93%  Weight:      Height:        Intake/Output Summary (Last 24 hours) at 06/10/2018 5003 Last data filed at 06/10/2018 0130 Gross per 24 hour  Intake 1035 ml  Output 2 ml  Net 1033 ml   Filed Weights   06/09/18 1545  Weight: 93.1 kg   General: Alert, Awake and Oriented to Time, Place and Person. Appear in mild distress, affect appropriate Eyes: PERRL, Conjunctiva normal ENT: Oral Mucosa clear moist. Neck: no JVD, no Abnormal Mass Or lumps Cardiovascular: S1 and S2 Present, no Murmur, Peripheral Pulses Present Respiratory: normal respiratory effort, Bilateral Air entry equal and Decreased, no use of accessory muscle, Clear to Auscultation, no Crackles, no wheezes Abdomen: Bowel Sound present, Soft and no tenderness, no hernia Skin: no redness, no Rash, no induration Extremities: no Pedal edema, no calf tenderness Neurologic: Grossly no focal neuro deficit. Bilaterally Equal motor strength  Data Reviewed: CBC: Recent Labs  Lab 06/10/18 0619  WBC 12.2*  NEUTROABS 10.5*  HGB 12.3  HCT 38.2  MCV 88.4  PLT 704*   Basic Metabolic Panel: Recent Labs  Lab 06/09/18 2121  NA 135  K 3.9  CL 102  CO2 25  GLUCOSE 150*  BUN <5*  CREATININE 0.54  CALCIUM 8.5*    Liver Function Tests: Recent Labs  Lab 06/09/18 2121  AST 47*  ALT 114*  ALKPHOS 126  BILITOT 0.4  PROT 6.7  ALBUMIN 2.6*   No results for input(s): LIPASE, AMYLASE in the last 168 hours. No results for input(s): AMMONIA in the last 168 hours. Coagulation Profile: No results for input(s): INR, PROTIME in the last 168 hours. Cardiac Enzymes: No results for input(s): CKTOTAL, CKMB, CKMBINDEX, TROPONINI in the last  168 hours. BNP (last 3 results) No results for input(s): PROBNP in the last 8760 hours. CBG: No results for input(s): GLUCAP in the last 168 hours. Studies: No results found.   Time spent: 35 minutes  Author: Berle Mull, MD [facility] 06/10/2018 8:38 AM  To reach On-call, see care teams to locate the attending and reach out to them via [email or web address]. If 7PM-7AM, please contact night-coverage If you still have difficulty reaching the attending provider, please page the [facility] (Director on Call) for [facility] on amion for assistance.

## 2018-06-11 ENCOUNTER — Inpatient Hospital Stay (HOSPITAL_COMMUNITY): Payer: Medicare HMO

## 2018-06-11 LAB — BASIC METABOLIC PANEL
Anion gap: 8 (ref 5–15)
BUN: 11 mg/dL (ref 8–23)
CO2: 25 mmol/L (ref 22–32)
Calcium: 8.4 mg/dL — ABNORMAL LOW (ref 8.9–10.3)
Chloride: 103 mmol/L (ref 98–111)
Creatinine, Ser: 0.76 mg/dL (ref 0.44–1.00)
GFR calc Af Amer: >60 mL/min (ref >60)
GFR calc non Af Amer: >60 mL/min (ref >60)
Glucose, Bld: 165 mg/dL — ABNORMAL HIGH (ref 70–99)
Potassium: 4.2 mmol/L (ref 3.5–5.1)
Sodium: 136 mmol/L (ref 135–145)

## 2018-06-11 LAB — CBC WITH DIFFERENTIAL/PLATELET
Abs Immature Granulocytes: 0.08 10*3/uL — ABNORMAL HIGH (ref 0.00–0.07)
Basophils Absolute: 0 10*3/uL (ref 0.0–0.1)
Basophils Relative: 0 %
Eosinophils Absolute: 0.2 10*3/uL (ref 0.0–0.5)
Eosinophils Relative: 2 %
HCT: 36.6 % (ref 36.0–46.0)
Hemoglobin: 11.8 g/dL — ABNORMAL LOW (ref 12.0–15.0)
Immature Granulocytes: 1 %
Lymphocytes Relative: 6 %
Lymphs Abs: 0.6 10*3/uL — ABNORMAL LOW (ref 0.7–4.0)
MCH: 28.6 pg (ref 26.0–34.0)
MCHC: 32.2 g/dL (ref 30.0–36.0)
MCV: 88.8 fL (ref 80.0–100.0)
Monocytes Absolute: 0.5 10*3/uL (ref 0.1–1.0)
Monocytes Relative: 4 %
Neutro Abs: 10 10*3/uL — ABNORMAL HIGH (ref 1.7–7.7)
Neutrophils Relative %: 87 %
Platelets: 425 10*3/uL — ABNORMAL HIGH (ref 150–400)
RBC: 4.12 MIL/uL (ref 3.87–5.11)
RDW: 14 % (ref 11.5–15.5)
WBC: 11.4 10*3/uL — ABNORMAL HIGH (ref 4.0–10.5)
nRBC: 0 % (ref 0.0–0.2)

## 2018-06-11 LAB — ACID FAST SMEAR (AFB, MYCOBACTERIA): Acid Fast Smear: NEGATIVE

## 2018-06-11 MED ORDER — MORPHINE SULFATE (PF) 2 MG/ML IV SOLN
2.0000 mg | INTRAVENOUS | Status: DC | PRN
  Administered 2018-06-11 (×2): 2 mg via INTRAVENOUS
  Administered 2018-06-12 (×2): 4 mg via INTRAVENOUS
  Administered 2018-06-13 – 2018-06-19 (×8): 2 mg via INTRAVENOUS
  Filled 2018-06-11 (×2): qty 1
  Filled 2018-06-11: qty 2
  Filled 2018-06-11 (×2): qty 1
  Filled 2018-06-11: qty 2
  Filled 2018-06-11: qty 1
  Filled 2018-06-11: qty 2
  Filled 2018-06-11: qty 1
  Filled 2018-06-11: qty 2
  Filled 2018-06-11 (×4): qty 1

## 2018-06-11 MED ORDER — METHOCARBAMOL 500 MG PO TABS
500.0000 mg | ORAL_TABLET | Freq: Three times a day (TID) | ORAL | Status: DC
  Administered 2018-06-11 – 2018-06-24 (×39): 500 mg via ORAL
  Filled 2018-06-11 (×38): qty 1

## 2018-06-11 NOTE — Progress Notes (Signed)
[facility] Progress Note  Patient: Julia Johns KVQ:[id number]   PCP: Patient, No Pcp Per DOB: [birth year]   DOA: 06/09/2018   DOS: 06/11/2018   Date of Service: the patient was seen and examined on 06/11/2018  Brief hospital course: Pt. with no significant PMH; admitted on 06/09/2018, presented with complaint of cough and fever, was found to have CAP. Currently further plan is continue IV Antibiotics.  Subjective: Continues to report pain in the back as well as chest and abdomen.  No nausea no vomiting.  No diarrhea.  Spanish interpreter was used for interpretation  Assessment and Plan: Acute hypoxemic respiratory failure requiring nasal cannula O2 supplementation Left lower lobe rounded density, in the setting of fever cough sputum production, scant hemoptysis, leukocytosis community-acquired pneumonia start with sputum cultures as well as AFB smear, per infectious disease PCCM consulted, recommended defer bronchoscopy and plan for repeat imaging in 4 to 6 weeks to ensure resolution of left lower lobe density if there is no resolution of the density on imaging after treatment she will need bronchoscopy  Right upper lobe pulmonary nodule She will need follow-up for the small subcentimeter right upper lobe pulmonary nodule, follow-up for this will be noncontrasted CT 9 to 12 months.  Diet: Cardiac diet DVT Prophylaxis: subcutaneous Heparin  Advance goals of care discussion: Full code  Family Communication: no family was present at bedside, at the time of interview.   Disposition:  Discharge to be determined .  Consultants: Neurology, neurosurgery, pulmonary, cardiothoracic surgery, infectious disease Procedures: none  Scheduled Meds: . methocarbamol  500 mg Oral TID  . senna-docusate  1 tablet Oral BID  . sodium chloride flush  3 mL Intravenous Q12H   Continuous Infusions: . lactated ringers 75 mL/hr at 06/11/18 1146  . meropenem (MERREM) IV 1 g (06/11/18  1443)   PRN Meds: acetaminophen **OR** acetaminophen, morphine injection, ondansetron **OR** ondansetron (ZOFRAN) IV, oxyCODONE, simethicone, zolpidem Antibiotics: Anti-infectives (From admission, onward)   Start     Dose/Rate Route Frequency Ordered Stop   06/10/18 1400  meropenem (MERREM) 1 g in sodium chloride 0.9 % 100 mL IVPB     1 g 200 mL/hr over 30 Minutes Intravenous Every 8 hours 06/10/18 1120         Objective: Physical Exam: Vitals:   06/10/18 2338 06/11/18 0836 06/11/18 1354 06/11/18 1647  BP: 111/77 113/76 (!) 127/96 108/64  Pulse: (!) 122 (!) 106 (!) 115 96  Resp: 20     Temp: 98.7 F (37.1 C) 99.8 F (37.7 C) 100.2 F (37.9 C) 98.5 F (36.9 C)  TempSrc: Oral Oral Oral Oral  SpO2: 93% 94% 96% 95%  Weight:      Height:        Intake/Output Summary (Last 24 hours) at 06/11/2018 1921 Last data filed at 06/11/2018 1800 Gross per 24 hour  Intake 3066.14 ml  Output -  Net 3066.14 ml   Filed Weights   06/09/18 1545  Weight: 93.1 kg   General: Alert, Awake and Oriented to Time, Place and Person. Appear in mild distress, affect appropriate Eyes: PERRL, Conjunctiva normal ENT: Oral Mucosa clear moist. Neck: no JVD, no Abnormal Mass Or lumps Cardiovascular: S1 and S2 Present, no Murmur, Peripheral Pulses Present Respiratory: normal respiratory effort, Bilateral Air entry equal and Decreased, no use of accessory muscle, Clear to Auscultation, no Crackles, no wheezes Abdomen: Bowel Sound present, Soft and no tenderness, no hernia Skin: no redness, no Rash, no induration Extremities: no Pedal edema, no  calf tenderness Neurologic: Grossly no focal neuro deficit. Bilaterally Equal motor strength  Data Reviewed: CBC: Recent Labs  Lab 06/10/18 0619 06/11/18 0649  WBC 12.2* 11.4*  NEUTROABS 10.5* 10.0*  HGB 12.3 11.8*  HCT 38.2 36.6  MCV 88.4 88.8  PLT 454* 169*   Basic Metabolic Panel: Recent Labs  Lab 06/09/18 2121 06/11/18 0649  NA 135 136  K 3.9  4.2  CL 102 103  CO2 25 25  GLUCOSE 150* 165*  BUN <5* 11  CREATININE 0.54 0.76  CALCIUM 8.5* 8.4*    Liver Function Tests: Recent Labs  Lab 06/09/18 2121  AST 47*  ALT 114*  ALKPHOS 126  BILITOT 0.4  PROT 6.7  ALBUMIN 2.6*   No results for input(s): LIPASE, AMYLASE in the last 168 hours. No results for input(s): AMMONIA in the last 168 hours. Coagulation Profile: No results for input(s): INR, PROTIME in the last 168 hours. Cardiac Enzymes: No results for input(s): CKTOTAL, CKMB, CKMBINDEX, TROPONINI in the last 168 hours. BNP (last 3 results) No results for input(s): PROBNP in the last 8760 hours. CBG: No results for input(s): GLUCAP in the last 168 hours. Studies: Dg Abd Portable 1v  Result Date: 06/11/2018 CLINICAL DATA:  Abdominal pain and distension. EXAM: PORTABLE ABDOMEN - 1 VIEW COMPARISON:  CT abdomen and pelvis 06/03/2018 FINDINGS: Gas is present in scattered nondilated loops of bowel without evidence of obstruction. No gross intraperitoneal free air is identified on this supine study. Thoracolumbar spondylosis is noted. The lung bases are not well evaluated due to motion artifact. IMPRESSION: Nonobstructed bowel gas pattern. Electronically Signed   By: Logan Bores M.D.   On: 06/11/2018 11:38     Time spent: 35 minutes  Author: Berle Mull, MD [facility] 06/11/2018 7:21 PM  To reach On-call, see care teams to locate the attending and reach out to them via [email or web address]. If 7PM-7AM, please contact night-coverage If you still have difficulty reaching the attending provider, please page the [facility] (Director on Call) for [facility] on amion for assistance.

## 2018-06-11 NOTE — Progress Notes (Signed)
Text sent to [facility] about if pt is to continue the MRI that wasn't finished last night? If so she needs something for Anxiety.

## 2018-06-11 NOTE — Progress Notes (Signed)
Dr.Blount returned call and is unsure if the PCP on the case wants to continue the MRI started yesterday. Plan now is to defer the MRI until PCP comes in tomorrow to make the determination if he wants to continue the MRI. Debbie in MRI notified of discussion above.

## 2018-06-11 NOTE — Progress Notes (Signed)
[place] for Infectious Disease  Date of Admission:  06/09/2018   Total days of antibiotics 2        Day 2 of Meropenum         ASSESSMENT: Pneumonia with possible LLL pulmonary mass: Likely non healthcare associated pneumonia. Will need to complete TB rule out given risk factors but unlikely given the rapidly changing nature of the infiltrate on CT and complete history. Will continue current therapy until cultures return. Pulmonary team consulted for bronchoscopy who feel that if there is no clinical improvement and no diagnosis made they will consider bronchoscopy. For now, they agree with the antibiotics and sputum/blood cultures. If she improves they would recommend repeat CT chest in 4-6 weeks.   From [facility]: Blood culture from 05/15, no growth to date, final No sputum culture No cultures from 05/19  PLAN: 1. Continue Meropenum 2. Ordered sputum culture/stain 3. F/up AFB sputum culture/stain and blood culture 4. Blood cultures negative x 24 hours  Principal Problem:   Pneumonia Active Problems:   Mass of lower lobe of left lung   Paravertebral mass  Scheduled Meds: . senna-docusate  1 tablet Oral BID  . sodium chloride flush  3 mL Intravenous Q12H   Continuous Infusions: . lactated ringers 75 mL/hr at 06/11/18 0630  . meropenem (MERREM) IV 1 g (06/11/18 0519)   PRN Meds:.acetaminophen **OR** acetaminophen, ondansetron **OR** ondansetron (ZOFRAN) IV, oxyCODONE, simethicone, zolpidem   SUBJECTIVE: A Spanish interpreter system was used to assess the patient. The patient stated that she has feeling about the same as the prior day. Her pain continues and is worse with deep breaths. She endorses persistent blood tinged sputum. She is in agreement with the current treatment plan.  Review of Systems: Review of Systems  Constitutional: Negative for chills, diaphoresis and fever.  Respiratory: Positive for cough, hemoptysis, sputum  production and shortness of breath. Negative for wheezing.   Cardiovascular: Negative for chest pain and leg swelling.  Gastrointestinal: Negative for constipation, diarrhea, nausea and vomiting.  Genitourinary: Negative for flank pain and urgency.  Musculoskeletal: Negative for myalgias.  Neurological: Negative for dizziness and headaches.  Psychiatric/Behavioral: Negative for depression. The patient is nervous/anxious.     Allergies  Allergen Reactions  . Penicillins Anaphylaxis    Patient stated that within a few minutes of receiving a penicillin IM injection she rapidly became unconscious and required "additional" medication to recover. This occurred when she was ~68 years old and she does not recall what was given to assist in her recovery. She did not develop a rash.     OBJECTIVE: Vitals:   06/10/18 2041 06/10/18 2042 06/10/18 2338 06/11/18 0836  BP:   111/77 113/76  Pulse: (!) 128 (!) 129 (!) 122 (!) 106  Resp:   20   Temp:   98.7 F (37.1 C) 99.8 F (37.7 C)  TempSrc:   Oral Oral  SpO2: 93% 93% 93% 94%  Weight:      Height:       Body mass index is 32.15 kg/m.  Physical Exam Vitals signs reviewed.  Constitutional:      General: She is not in acute distress.    Appearance: She is well-developed. She is not diaphoretic.  Neck:     Musculoskeletal: Normal range of motion and neck supple.  Cardiovascular:     Rate and Rhythm: Normal rate and regular rhythm.     Heart sounds: No murmur.  Pulmonary:  Effort: Pulmonary effort is normal. No respiratory distress.     Breath sounds: No stridor. Wheezing present. No rhonchi.     Comments: Auscultation portion of exam limited by HEPA filtration system Abdominal:     General: Bowel sounds are normal. There is no distension.     Palpations: Abdomen is soft.     Tenderness: There is no abdominal tenderness.  Skin:    General: Skin is warm.     Capillary Refill: Capillary refill takes less than 2 seconds.   Neurological:     Mental Status: She is alert and oriented to person, place, and time.    Lab Results Lab Results  Component Value Date   WBC 11.4 (H) 06/11/2018   HGB 11.8 (L) 06/11/2018   HCT 36.6 06/11/2018   MCV 88.8 06/11/2018   PLT 425 (H) 06/11/2018    Lab Results  Component Value Date   CREATININE 0.76 06/11/2018   BUN 11 06/11/2018   NA 136 06/11/2018   K 4.2 06/11/2018   CL 103 06/11/2018   CO2 25 06/11/2018    Lab Results  Component Value Date   ALT 114 (H) 06/09/2018   AST 47 (H) 06/09/2018   ALKPHOS 126 06/09/2018   BILITOT 0.4 06/09/2018     Microbiology: Recent Results (from the past 240 hour(s))  MRSA PCR Screening     Status: None   Collection Time: 06/09/18  3:45 PM  Result Value Ref Range Status   MRSA by PCR NEGATIVE NEGATIVE Final    Comment:        The GeneXpert MRSA Assay (FDA approved for NASAL specimens only), is one component of a comprehensive MRSA colonization surveillance program. It is not intended to diagnose MRSA infection nor to guide or monitor treatment for MRSA infections. Performed at [facility], [place] [address] [place], [place] [address]   Culture, blood (routine x 2)     Status: None (Preliminary result)   Collection Time: 06/09/18  5:00 PM  Result Value Ref Range Status   Specimen Description BLOOD LEFT ANTECUBITAL  Final   Special Requests   Final    BOTTLES DRAWN AEROBIC ONLY Blood Culture adequate volume   Culture   Final    NO GROWTH < 24 HOURS Performed at [facility], [place] [address], [place], [place] [address]    Report Status PENDING  Incomplete  Culture, blood (routine x 2)     Status: None (Preliminary result)   Collection Time: 06/09/18  5:05 PM  Result Value Ref Range Status   Specimen Description BLOOD LEFT ANTECUBITAL  Final   Special Requests   Final    BOTTLES DRAWN AEROBIC ONLY Blood Culture adequate volume   Culture   Final    NO GROWTH < 24 HOURS Performed at [facility], [place] [address], [place], [place] [address]    Report Status PENDING  Incomplete    Kathi Ludwig, [place] for Infectious [place] [phone] pager   [phone] cell 06/11/2018, 10:40 AM

## 2018-06-11 NOTE — Progress Notes (Signed)
Went into pt's rm and she was rocking back and forth in the chair. Tearful and stated she hurt real bad. I attempted to explain to the pt a couple of times that she needed to call staff when pain first starts or starts to get bad so she can get pain meds before it gets this bad. Pt just kept saying 'yes'. Given Oxy IR per orders. Pt now grabbing at her lower right back, but still rubbing her upper left abd.

## 2018-06-12 DIAGNOSIS — G061 Intraspinal abscess and granuloma: Secondary | ICD-10-CM

## 2018-06-12 DIAGNOSIS — R0602 Shortness of breath: Secondary | ICD-10-CM

## 2018-06-12 DIAGNOSIS — R51 Headache: Secondary | ICD-10-CM

## 2018-06-12 LAB — QUANTIFERON-TB GOLD PLUS: QuantiFERON-TB Gold Plus: UNDETERMINED — AB

## 2018-06-12 LAB — EXPECTORATED SPUTUM ASSESSMENT W GRAM STAIN, RFLX TO RESP C

## 2018-06-12 LAB — QUANTIFERON-TB GOLD PLUS (RQFGPL)
QuantiFERON Mitogen Value: 0.34 IU/mL
QuantiFERON Nil Value: 0.02 IU/mL
QuantiFERON TB1 Ag Value: 0.03 IU/mL
QuantiFERON TB2 Ag Value: 0.02 IU/mL

## 2018-06-12 MED ORDER — HYDROCOD POLST-CPM POLST ER 10-8 MG/5ML PO SUER
5.0000 mL | Freq: Two times a day (BID) | ORAL | Status: DC
  Administered 2018-06-12 – 2018-06-23 (×22): 5 mL via ORAL
  Filled 2018-06-12 (×22): qty 5

## 2018-06-12 MED ORDER — BENZONATATE 100 MG PO CAPS
100.0000 mg | ORAL_CAPSULE | Freq: Three times a day (TID) | ORAL | Status: DC
  Administered 2018-06-12 – 2018-06-26 (×42): 100 mg via ORAL
  Filled 2018-06-12 (×41): qty 1

## 2018-06-12 MED ORDER — FUROSEMIDE 10 MG/ML IJ SOLN
40.0000 mg | Freq: Once | INTRAMUSCULAR | Status: AC
  Administered 2018-06-12: 40 mg via INTRAVENOUS
  Filled 2018-06-12: qty 4

## 2018-06-12 MED ORDER — GUAIFENESIN ER 600 MG PO TB12
600.0000 mg | ORAL_TABLET | Freq: Two times a day (BID) | ORAL | Status: DC
  Administered 2018-06-12 – 2018-06-26 (×28): 600 mg via ORAL
  Filled 2018-06-12 (×27): qty 1

## 2018-06-12 NOTE — Progress Notes (Signed)
AFB pending, PCCM will sign off, please call if a bronch is necessary  Rush Farmer, M.D. [facility] Pulmonary/Critical Care Medicine. Pager: [phone]. After hours pager: [phone].

## 2018-06-12 NOTE — Progress Notes (Signed)
[place] for Infectious Disease  Date of Admission:  06/09/2018   Total days of antibiotics 3        Day 3 of Meropenum         ASSESSMENT: The patient has remained slightly febrile with a T-max of 100.2 overnight and continues to require supplemental oxygen 2L via [place]. Vitals otherwise stable. CBC with improving. Pain continues but improved. Low probability of TB. Given the patients improvement on CAP coverage we would agree with discontinuing TB precautions if repeat AFB sputum negative x 2 and she continues to improve.  Would complete standard 5 days of antibiotic therapy for CAP coverage if she demonstrated continued improvement. Repeat Chest CT in 4-6 weeks for pulmonary LLL mass.  PLAN: 1. Continue Meropenum 2. Agree with repeat sputum AFB stain 3. Continue non-AFB sputum for gram stain and culture  Principal Problem:   Pneumonia Active Problems:   Mass of lower lobe of left lung   Paravertebral mass   Scheduled Meds: . methocarbamol  500 mg Oral TID  . senna-docusate  1 tablet Oral BID  . sodium chloride flush  3 mL Intravenous Q12H   Continuous Infusions: . lactated ringers 75 mL/hr at 06/12/18 0516  . meropenem (MERREM) IV 1 g (06/12/18 0519)   PRN Meds:.acetaminophen **OR** acetaminophen, morphine injection, ondansetron **OR** ondansetron (ZOFRAN) IV, oxyCODONE, simethicone, zolpidem   SUBJECTIVE: A spanish interpreter was used during the evaluation. The patient stated that she feels better with two thumbs up today. Has been going to the restroom regularly. She feels less short of breath with decreasing chest pain. Is in agreement with the plan to continue antibiotics over the weekend and likely discharge home early next week if she continues to improve.   Review of Systems: Review of Systems  Constitutional: Negative for fever.  Respiratory: Positive for cough, sputum production (blood tinged) and shortness of breath. Negative for wheezing.    Cardiovascular: Positive for chest pain (improving).  Musculoskeletal: Positive for back pain (left of center, ~T5-10).  Neurological: Positive for headaches.    Allergies  Allergen Reactions  . Penicillins Anaphylaxis    Patient stated that within a few minutes of receiving a penicillin IM injection she rapidly became unconscious and required "additional" medication to recover. This occurred when she was ~68 years old and she does not recall what was given to assist in her recovery. She did not develop a rash.     OBJECTIVE: Vitals:   06/11/18 0836 06/11/18 1354 06/11/18 1647 06/11/18 2239  BP: 113/76 (!) 127/96 108/64 131/90  Pulse: (!) 106 (!) 115 96 (!) 114  Resp:    (!) 24  Temp: 99.8 F (37.7 C) 100.2 F (37.9 C) 98.5 F (36.9 C) 99 F (37.2 C)  TempSrc: Oral Oral Oral Oral  SpO2: 94% 96% 95%   Weight:      Height:       Body mass index is 32.15 kg/m.  Physical Exam Constitutional:      General: She is not in acute distress.    Appearance: She is obese. She is not ill-appearing or diaphoretic.  Cardiovascular:     Rate and Rhythm: Regular rhythm. Tachycardia present.     Heart sounds: No murmur.  Pulmonary:     Effort: Pulmonary effort is normal. No respiratory distress.     Breath sounds: No stridor. Wheezing (faint upper airway) present.  Neurological:     Mental Status: She is alert and  oriented to person, place, and time.     Lab Results Lab Results  Component Value Date   WBC 11.4 (H) 06/11/2018   HGB 11.8 (L) 06/11/2018   HCT 36.6 06/11/2018   MCV 88.8 06/11/2018   PLT 425 (H) 06/11/2018    Lab Results  Component Value Date   CREATININE 0.76 06/11/2018   BUN 11 06/11/2018   NA 136 06/11/2018   K 4.2 06/11/2018   CL 103 06/11/2018   CO2 25 06/11/2018    Lab Results  Component Value Date   ALT 114 (H) 06/09/2018   AST 47 (H) 06/09/2018   ALKPHOS 126 06/09/2018   BILITOT 0.4 06/09/2018     Microbiology: Recent Results (from the past  240 hour(s))  MRSA PCR Screening     Status: None   Collection Time: 06/09/18  3:45 PM  Result Value Ref Range Status   MRSA by PCR NEGATIVE NEGATIVE Final    Comment:        The GeneXpert MRSA Assay (FDA approved for NASAL specimens only), is one component of a comprehensive MRSA colonization surveillance program. It is not intended to diagnose MRSA infection nor to guide or monitor treatment for MRSA infections. Performed at [facility], [place] [address] [place], [place] [address]   Culture, blood (routine x 2)     Status: None (Preliminary result)   Collection Time: 06/09/18  5:00 PM  Result Value Ref Range Status   Specimen Description BLOOD LEFT ANTECUBITAL  Final   Special Requests   Final    BOTTLES DRAWN AEROBIC ONLY Blood Culture adequate volume   Culture   Final    NO GROWTH 2 DAYS Performed at [facility], [place] [address], [place], [place] [address]    Report Status PENDING  Incomplete  Culture, blood (routine x 2)     Status: None (Preliminary result)   Collection Time: 06/09/18  5:05 PM  Result Value Ref Range Status   Specimen Description BLOOD LEFT ANTECUBITAL  Final   Special Requests   Final    BOTTLES DRAWN AEROBIC ONLY Blood Culture adequate volume   Culture   Final    NO GROWTH 2 DAYS Performed at [facility], [place] [address], [place], [place] [address]    Report Status PENDING  Incomplete  Acid Fast Smear (AFB)     Status: None   Collection Time: 06/10/18 10:38 AM  Result Value Ref Range Status   AFB Specimen Processing Concentration  Final   Acid Fast Smear Negative  Final    Comment: (NOTE) Performed At: [facility] [place], [place] [id number] Rush Farmer MD [id number]    Source (AFB) SPUTUM  Final    Comment: Performed at [facility], [place] [address] [place], [place] [address]    Kathi Ludwig, [place] for Infectious [place] [facility] [phone]  pager   [phone] cell 06/12/2018, 8:30 AM

## 2018-06-12 NOTE — Progress Notes (Addendum)
[facility] Progress Note  Patient: Julia Johns MSX:[id number]   PCP: Patient, No Pcp Per DOB: [birth year]   DOA: 06/09/2018   DOS: 06/12/2018   Date of Service: the patient was seen and examined on 06/12/2018  Brief hospital course: Pt. with no significant PMH; admitted on 06/09/2018, presented with complaint of cough and fever, was found to have CAP. Currently further plan is continue IV Antibiotics.  Subjective: Pain is somewhat improved with pain medication.  No nausea no vomiting.  Continues to have severe cough with blood.  Assessment and Plan: Acute hypoxemic respiratory failure requiring nasal cannula O2 supplementation Left lower lobe rounded density, in the setting of fever cough sputum production, scant hemoptysis, leukocytosis community-acquired pneumonia start with sputum cultures as well as AFB smear, per infectious disease PCCM consulted, recommended defer bronchoscopy and plan for repeat imaging in 4 to 6 weeks to ensure resolution of left lower lobe density if there is no resolution of the density on imaging after treatment she will need bronchoscopy QuantiFERON-TB gold is indeterminant. AFB smear x1-.  If negative x2 per ID patient can come off of isolation. Provide Tussionex and other cough suppressant.  Constipation. Provide stool softener.  Right upper lobe pulmonary nodule She will need follow-up for the small subcentimeter right upper lobe pulmonary nodule, follow-up for this will be noncontrasted CT 9 to 12 months.  Diet: Cardiac diet DVT Prophylaxis: subcutaneous Heparin  Advance goals of care discussion: Full code  Family Communication: no family was present at bedside, at the time of interview.   Disposition:  Discharge to be determined .  Consultants: Neurology, neurosurgery, pulmonary, cardiothoracic surgery, infectious disease Procedures: none  Scheduled Meds: . benzonatate  100 mg Oral TID  . chlorpheniramine-HYDROcodone  5 mL  Oral Q12H  . guaiFENesin  600 mg Oral BID  . methocarbamol  500 mg Oral TID  . senna-docusate  1 tablet Oral BID  . sodium chloride flush  3 mL Intravenous Q12H   Continuous Infusions: . meropenem (MERREM) IV 1 g (06/12/18 1623)   PRN Meds: acetaminophen **OR** acetaminophen, morphine injection, ondansetron **OR** ondansetron (ZOFRAN) IV, oxyCODONE, simethicone, zolpidem Antibiotics: Anti-infectives (From admission, onward)   Start     Dose/Rate Route Frequency Ordered Stop   06/10/18 1400  meropenem (MERREM) 1 g in sodium chloride 0.9 % 100 mL IVPB     1 g 200 mL/hr over 30 Minutes Intravenous Every 8 hours 06/10/18 1120         Objective: Physical Exam: Vitals:   06/11/18 1354 06/11/18 1647 06/11/18 2239 06/12/18 0849  BP: (!) 127/96 108/64 131/90 109/67  Pulse: (!) 115 96 (!) 114 (!) 106  Resp:   (!) 24   Temp: 100.2 F (37.9 C) 98.5 F (36.9 C) 99 F (37.2 C) 98.4 F (36.9 C)  TempSrc: Oral Oral Oral Oral  SpO2: 96% 95%  90%  Weight:      Height:        Intake/Output Summary (Last 24 hours) at 06/12/2018 1905 Last data filed at 06/12/2018 1500 Gross per 24 hour  Intake 1513.85 ml  Output -  Net 1513.85 ml   Filed Weights   06/09/18 1545  Weight: 93.1 kg   General: Alert, Awake and Oriented to Time, Place and Person. Appear in mild distress, affect appropriate Eyes: PERRL, Conjunctiva normal ENT: Oral Mucosa clear moist. Neck: no JVD, no Abnormal Mass Or lumps Cardiovascular: S1 and S2 Present, no Murmur, Peripheral Pulses Present Respiratory: normal respiratory effort, Bilateral Air  entry equal and Decreased, no use of accessory muscle, Clear to Auscultation, no Crackles, no wheezes Abdomen: Bowel Sound present, Soft and no tenderness, no hernia Skin: no redness, no Rash, no induration Extremities: no Pedal edema, no calf tenderness Neurologic: Grossly no focal neuro deficit. Bilaterally Equal motor strength  Data Reviewed: CBC: Recent Labs  Lab  06/10/18 0619 06/11/18 0649  WBC 12.2* 11.4*  NEUTROABS 10.5* 10.0*  HGB 12.3 11.8*  HCT 38.2 36.6  MCV 88.4 88.8  PLT 454* 102*   Basic Metabolic Panel: Recent Labs  Lab 06/09/18 2121 06/11/18 0649  NA 135 136  K 3.9 4.2  CL 102 103  CO2 25 25  GLUCOSE 150* 165*  BUN <5* 11  CREATININE 0.54 0.76  CALCIUM 8.5* 8.4*    Liver Function Tests: Recent Labs  Lab 06/09/18 2121  AST 47*  ALT 114*  ALKPHOS 126  BILITOT 0.4  PROT 6.7  ALBUMIN 2.6*   No results for input(s): LIPASE, AMYLASE in the last 168 hours. No results for input(s): AMMONIA in the last 168 hours. Coagulation Profile: No results for input(s): INR, PROTIME in the last 168 hours. Cardiac Enzymes: No results for input(s): CKTOTAL, CKMB, CKMBINDEX, TROPONINI in the last 168 hours. BNP (last 3 results) No results for input(s): PROBNP in the last 8760 hours. CBG: No results for input(s): GLUCAP in the last 168 hours. Studies: No results found.   Time spent: 35 minutes  Author: Berle Mull, MD [facility] 06/12/2018 7:05 PM  To reach On-call, see care teams to locate the attending and reach out to them via [email or web address]. If 7PM-7AM, please contact night-coverage If you still have difficulty reaching the attending provider, please page the [facility] (Director on Call) for [facility] on amion for assistance.

## 2018-06-13 LAB — BASIC METABOLIC PANEL
Anion gap: 12 (ref 5–15)
BUN: 12 mg/dL (ref 8–23)
CO2: 27 mmol/L (ref 22–32)
Calcium: 8.4 mg/dL — ABNORMAL LOW (ref 8.9–10.3)
Chloride: 95 mmol/L — ABNORMAL LOW (ref 98–111)
Creatinine, Ser: 0.59 mg/dL (ref 0.44–1.00)
GFR calc Af Amer: >60 mL/min (ref >60)
GFR calc non Af Amer: >60 mL/min (ref >60)
Glucose, Bld: 163 mg/dL — ABNORMAL HIGH (ref 70–99)
Potassium: 3.4 mmol/L — ABNORMAL LOW (ref 3.5–5.1)
Sodium: 134 mmol/L — ABNORMAL LOW (ref 135–145)

## 2018-06-13 LAB — CBC
HCT: 35.8 % — ABNORMAL LOW (ref 36.0–46.0)
Hemoglobin: 11.5 g/dL — ABNORMAL LOW (ref 12.0–15.0)
MCH: 28 pg (ref 26.0–34.0)
MCHC: 32.1 g/dL (ref 30.0–36.0)
MCV: 87.3 fL (ref 80.0–100.0)
Platelets: 466 10*3/uL — ABNORMAL HIGH (ref 150–400)
RBC: 4.1 MIL/uL (ref 3.87–5.11)
RDW: 13.7 % (ref 11.5–15.5)
WBC: 12.3 10*3/uL — ABNORMAL HIGH (ref 4.0–10.5)
nRBC: 0 % (ref 0.0–0.2)

## 2018-06-13 LAB — ACID FAST SMEAR (AFB, MYCOBACTERIA): Acid Fast Smear: NEGATIVE

## 2018-06-13 MED ORDER — SODIUM CHLORIDE 0.9 % IV BOLUS
500.0000 mL | Freq: Once | INTRAVENOUS | Status: AC
  Administered 2018-06-14: 500 mL via INTRAVENOUS

## 2018-06-13 NOTE — Progress Notes (Signed)
Placed patient on salter high flow cannula set at 8lpm. Sp02=95-97% at this time Breath Sounds diminished.

## 2018-06-13 NOTE — Progress Notes (Signed)
[facility] Progress Note  Patient: Julia Johns PPI:[id number]   PCP: Patient, No Pcp Per DOB: 07-Apr-1950   DOA: 06/09/2018   DOS: 06/13/2018   Date of Service: the patient was seen and examined on 06/13/2018  Brief hospital course: Pt. with no significant PMH; admitted on 06/09/2018, presented with complaint of cough and fever, was found to have CAP. Currently further plan is continue IV Antibiotics.  Subjective: Continues to report cough.  Also reports headache and abdominal pain with cough.  No nausea no vomiting no fever no chills.  Assessment and Plan: Acute hypoxemic respiratory failure requiring nasal cannula O2 supplementation Left lower lobe rounded density, in the setting of fever cough sputum production, scant hemoptysis, leukocytosis community-acquired pneumonia start with sputum cultures as well as AFB smear, per infectious disease PCCM consulted, recommended defer bronchoscopy and plan for repeat imaging in 4 to 6 weeks to ensure resolution of left lower lobe density if there is no resolution of the density on imaging after treatment she will need bronchoscopy QuantiFERON-TB gold is indeterminant. AFB smear x1-.  If negative x2 per ID patient can come off of isolation. Provide Tussionex and other cough suppressant.  Constipation. Provide stool softener.  Right upper lobe pulmonary nodule She will need follow-up for the small subcentimeter right upper lobe pulmonary nodule, follow-up for this will be noncontrasted CT 9 to 12 months.  Diet: Cardiac diet DVT Prophylaxis: subcutaneous Heparin  Advance goals of care discussion: Full code  Family Communication: no family was present at bedside, at the time of interview.   Disposition:  Discharge to be determined .  Consultants: Neurology, neurosurgery, pulmonary, cardiothoracic surgery, infectious disease Procedures: none  Scheduled Meds: . benzonatate  100 mg Oral TID  . chlorpheniramine-HYDROcodone   5 mL Oral Q12H  . guaiFENesin  600 mg Oral BID  . methocarbamol  500 mg Oral TID  . senna-docusate  1 tablet Oral BID  . sodium chloride flush  3 mL Intravenous Q12H   Continuous Infusions: . meropenem (MERREM) IV Stopped (06/13/18 1416)   PRN Meds: acetaminophen **OR** acetaminophen, morphine injection, ondansetron **OR** ondansetron (ZOFRAN) IV, oxyCODONE, simethicone, zolpidem Antibiotics: Anti-infectives (From admission, onward)   Start     Dose/Rate Route Frequency Ordered Stop   06/10/18 1400  meropenem (MERREM) 1 g in sodium chloride 0.9 % 100 mL IVPB     1 g 200 mL/hr over 30 Minutes Intravenous Every 8 hours 06/10/18 1120         Objective: Physical Exam: Vitals:   06/13/18 0649 06/13/18 0650 06/13/18 0811 06/13/18 1630  BP:   (!) 91/49 122/77  Pulse: (!) 131  (!) 125 (!) 129  Resp: 16     Temp: (!) 101.3 F (38.5 C)  98.9 F (37.2 C) 98.4 F (36.9 C)  TempSrc: Oral  Oral Oral  SpO2: (!) 89% 92% 93% 91%  Weight:      Height:        Intake/Output Summary (Last 24 hours) at 06/13/2018 1653 Last data filed at 06/13/2018 1423 Gross per 24 hour  Intake 560.07 ml  Output -  Net 560.07 ml   Filed Weights   06/09/18 1545  Weight: 93.1 kg   General: Alert, Awake and Oriented to Time, Place and Person. Appear in mild distress, affect appropriate Eyes: PERRL, Conjunctiva normal ENT: Oral Mucosa clear moist. Neck: no JVD, no Abnormal Mass Or lumps Cardiovascular: S1 and S2 Present, no Murmur, Peripheral Pulses Present Respiratory: normal respiratory effort, Bilateral Air entry  equal and Decreased, no use of accessory muscle, Clear to Auscultation, no Crackles, no wheezes Abdomen: Bowel Sound present, Soft and no tenderness, no hernia Skin: no redness, no Rash, no induration Extremities: no Pedal edema, no calf tenderness Neurologic: Grossly no focal neuro deficit. Bilaterally Equal motor strength  Data Reviewed: CBC: Recent Labs  Lab 06/10/18 0619 06/11/18  0649 06/13/18 0944  WBC 12.2* 11.4* 12.3*  NEUTROABS 10.5* 10.0*  --   HGB 12.3 11.8* 11.5*  HCT 38.2 36.6 35.8*  MCV 88.4 88.8 87.3  PLT 454* 425* 725*   Basic Metabolic Panel: Recent Labs  Lab 06/09/18 2121 06/11/18 0649 06/13/18 0944  NA 135 136 134*  K 3.9 4.2 3.4*  CL 102 103 95*  CO2 25 25 27   GLUCOSE 150* 165* 163*  BUN <5* 11 12  CREATININE 0.54 0.76 0.59  CALCIUM 8.5* 8.4* 8.4*    Liver Function Tests: Recent Labs  Lab 06/09/18 2121  AST 47*  ALT 114*  ALKPHOS 126  BILITOT 0.4  PROT 6.7  ALBUMIN 2.6*   No results for input(s): LIPASE, AMYLASE in the last 168 hours. No results for input(s): AMMONIA in the last 168 hours. Coagulation Profile: No results for input(s): INR, PROTIME in the last 168 hours. Cardiac Enzymes: No results for input(s): CKTOTAL, CKMB, CKMBINDEX, TROPONINI in the last 168 hours. BNP (last 3 results) No results for input(s): PROBNP in the last 8760 hours. CBG: No results for input(s): GLUCAP in the last 168 hours. Studies: No results found.   Time spent: 35 minutes  Author: Berle Mull, MD [facility] 06/13/2018 4:53 PM  To reach On-call, see care teams to locate the attending and reach out to them via [email or web address]. If 7PM-7AM, please contact night-coverage If you still have difficulty reaching the attending provider, please page the [facility] (Director on Call) for [facility] on amion for assistance.

## 2018-06-13 NOTE — Progress Notes (Signed)
MD paged to determine if any update needed of pt isolation status d/t speaking with lab and confirming only 1 quantiferon test resulting at this time as indeterminate and no other test noted as of this time. Pt remains on current precautions and staff aware.

## 2018-06-13 NOTE — Progress Notes (Signed)
MD paged:Pt hr elevated 120+ respirations-mid 20's-30's, temp 100.7.  No c/o pain. Respiratory called.

## 2018-06-14 ENCOUNTER — Inpatient Hospital Stay (HOSPITAL_COMMUNITY): Payer: Medicare HMO

## 2018-06-14 LAB — CBC
HCT: 38.1 % (ref 36.0–46.0)
Hemoglobin: 12 g/dL (ref 12.0–15.0)
MCH: 28.1 pg (ref 26.0–34.0)
MCHC: 31.5 g/dL (ref 30.0–36.0)
MCV: 89.2 fL (ref 80.0–100.0)
Platelets: 501 10*3/uL — ABNORMAL HIGH (ref 150–400)
RBC: 4.27 MIL/uL (ref 3.87–5.11)
RDW: 14 % (ref 11.5–15.5)
WBC: 14 10*3/uL — ABNORMAL HIGH (ref 4.0–10.5)
nRBC: 0 % (ref 0.0–0.2)

## 2018-06-14 LAB — CULTURE, BLOOD (ROUTINE X 2)
Culture: NO GROWTH
Culture: NO GROWTH
Special Requests: ADEQUATE
Special Requests: ADEQUATE

## 2018-06-14 LAB — CULTURE, RESPIRATORY W GRAM STAIN

## 2018-06-14 LAB — BASIC METABOLIC PANEL
Anion gap: 10 (ref 5–15)
BUN: 12 mg/dL (ref 8–23)
CO2: 27 mmol/L (ref 22–32)
Calcium: 8.5 mg/dL — ABNORMAL LOW (ref 8.9–10.3)
Chloride: 98 mmol/L (ref 98–111)
Creatinine, Ser: 0.53 mg/dL (ref 0.44–1.00)
GFR calc Af Amer: >60 mL/min (ref >60)
GFR calc non Af Amer: >60 mL/min (ref >60)
Glucose, Bld: 154 mg/dL — ABNORMAL HIGH (ref 70–99)
Potassium: 4.3 mmol/L (ref 3.5–5.1)
Sodium: 135 mmol/L (ref 135–145)

## 2018-06-14 MED ORDER — FUROSEMIDE 10 MG/ML IJ SOLN
40.0000 mg | Freq: Every day | INTRAMUSCULAR | Status: DC
  Administered 2018-06-14 – 2018-06-15 (×2): 40 mg via INTRAVENOUS
  Filled 2018-06-14 (×2): qty 4

## 2018-06-14 MED ORDER — IOHEXOL 300 MG/ML  SOLN
100.0000 mL | Freq: Once | INTRAMUSCULAR | Status: AC | PRN
  Administered 2018-06-14: 100 mL via INTRAVENOUS

## 2018-06-14 NOTE — Progress Notes (Signed)
[facility] Progress Note  Patient: Julia Johns AOZ:[id number]   PCP: Patient, No Pcp Per DOB: 31-Mar-1950   DOA: 06/09/2018   DOS: 06/14/2018   Date of Service: the patient was seen and examined on 06/14/2018  Brief hospital course: Pt. with no significant PMH; admitted on 06/09/2018, presented with complaint of cough and fever, was found to have CAP. Currently further plan is continue IV Antibiotics.  Subjective: Reports severe abdominal pain as well as nausea and episode of vomiting this morning.  Not passing gas.  Abdominal distention reported as well.  No fever no chills.  Continues to have right-sided back pain.  Assessment and Plan: Acute hypoxemic respiratory failure requiring nasal cannula O2 supplementation Left lower lobe rounded density, in the setting of fever cough sputum production, scant hemoptysis, leukocytosis community-acquired pneumonia start with sputum cultures as well as AFB smear, per infectious disease PCCM consulted, recommended defer bronchoscopy and plan for repeat imaging in 4 to 6 weeks to ensure resolution of left lower lobe density if there is no resolution of the density on imaging after treatment she will need bronchoscopy QuantiFERON-TB gold is indeterminant. AFB smear negative x2. per ID patient can come off of isolation. Provide Tussionex and other cough suppressant.  Constipation. Provide stool softener.  Right upper lobe pulmonary nodule She will need follow-up for the small subcentimeter right upper lobe pulmonary nodule, follow-up for this will be noncontrasted CT 9 to 12 months.  Abdominal pain. Colitis. Nausea and vomiting. Due to her worsening severe abdominal pain as well as nausea and vomiting this morning a CT abdomen pelvis with contrast was performed. Evidence of persistent colitis without any worsening. No other acute abnormality.  Diet: Cardiac diet DVT Prophylaxis: subcutaneous Heparin  Advance goals of care  discussion: Full code  Family Communication: no family was present at bedside, at the time of interview.   Disposition:  Discharge to be determined .  Consultants: Neurology, neurosurgery, pulmonary, cardiothoracic surgery, infectious disease Procedures: none  Scheduled Meds: . benzonatate  100 mg Oral TID  . chlorpheniramine-HYDROcodone  5 mL Oral Q12H  . furosemide  40 mg Intravenous Daily  . guaiFENesin  600 mg Oral BID  . methocarbamol  500 mg Oral TID  . senna-docusate  1 tablet Oral BID  . sodium chloride flush  3 mL Intravenous Q12H   Continuous Infusions: . meropenem (MERREM) IV Stopped (06/14/18 1411)   PRN Meds: acetaminophen **OR** acetaminophen, morphine injection, ondansetron **OR** ondansetron (ZOFRAN) IV, oxyCODONE, simethicone, zolpidem Antibiotics: Anti-infectives (From admission, onward)   Start     Dose/Rate Route Frequency Ordered Stop   06/10/18 1400  meropenem (MERREM) 1 g in sodium chloride 0.9 % 100 mL IVPB     1 g 200 mL/hr over 30 Minutes Intravenous Every 8 hours 06/10/18 1120         Objective: Physical Exam: Vitals:   06/13/18 2328 06/14/18 0008 06/14/18 0800 06/14/18 1558  BP: (!) 146/94  116/78 99/63  Pulse: (!) 125 (!) 127 (!) 104 (!) 106  Resp: (!) 30  20 16   Temp: (!) 100.7 F (38.2 C)  99.4 F (37.4 C) 100.1 F (37.8 C)  TempSrc: Oral  Oral Oral  SpO2: (!) 87% 97% 100% 97%  Weight:      Height:        Intake/Output Summary (Last 24 hours) at 06/14/2018 2007 Last data filed at 06/14/2018 1721 Gross per 24 hour  Intake 540 ml  Output -  Net 540 ml  Filed Weights   06/09/18 1545  Weight: 93.1 kg   General: Alert, Awake and Oriented to Time, Place and Person. Appear in mild distress, affect appropriate Eyes: PERRL, Conjunctiva normal ENT: Oral Mucosa clear moist. Neck: no JVD, no Abnormal Mass Or lumps Cardiovascular: S1 and S2 Present, no Murmur, Peripheral Pulses Present Respiratory: normal respiratory effort, Bilateral  Air entry equal and Decreased, no use of accessory muscle, Clear to Auscultation, no Crackles, no wheezes Abdomen: Bowel Sound present, Soft and no tenderness, no hernia Skin: no redness, no Rash, no induration Extremities: no Pedal edema, no calf tenderness Neurologic: Grossly no focal neuro deficit. Bilaterally Equal motor strength  Data Reviewed: CBC: Recent Labs  Lab 06/10/18 0619 06/11/18 0649 06/13/18 0944 06/14/18 0528  WBC 12.2* 11.4* 12.3* 14.0*  NEUTROABS 10.5* 10.0*  --   --   HGB 12.3 11.8* 11.5* 12.0  HCT 38.2 36.6 35.8* 38.1  MCV 88.4 88.8 87.3 89.2  PLT 454* 425* 466* 623*   Basic Metabolic Panel: Recent Labs  Lab 06/09/18 2121 06/11/18 0649 06/13/18 0944 06/14/18 0528  NA 135 136 134* 135  K 3.9 4.2 3.4* 4.3  CL 102 103 95* 98  CO2 25 25 27 27   GLUCOSE 150* 165* 163* 154*  BUN <5* 11 12 12   CREATININE 0.54 0.76 0.59 0.53  CALCIUM 8.5* 8.4* 8.4* 8.5*    Liver Function Tests: Recent Labs  Lab 06/09/18 2121  AST 47*  ALT 114*  ALKPHOS 126  BILITOT 0.4  PROT 6.7  ALBUMIN 2.6*   No results for input(s): LIPASE, AMYLASE in the last 168 hours. No results for input(s): AMMONIA in the last 168 hours. Coagulation Profile: No results for input(s): INR, PROTIME in the last 168 hours. Cardiac Enzymes: No results for input(s): CKTOTAL, CKMB, CKMBINDEX, TROPONINI in the last 168 hours. BNP (last 3 results) No results for input(s): PROBNP in the last 8760 hours. CBG: No results for input(s): GLUCAP in the last 168 hours. Studies: Ct Abdomen Pelvis W Contrast  Result Date: 06/14/2018 CLINICAL DATA:  Abdominal distention, nausea, vomiting, pain, colitis, paravertebral phlegmon EXAM: CT ABDOMEN AND PELVIS WITH CONTRAST TECHNIQUE: Multidetector CT imaging of the abdomen and pelvis was performed using the standard protocol following bolus administration of intravenous contrast. CONTRAST:  194mL OMNIPAQUE IOHEXOL 300 MG/ML SOLN, additional oral enteric contrast  COMPARISON:  CT abdomen pelvis, 06/03/2018, CT chest, 06/05/2018, MR thoracic spine, 06/05/2018 FINDINGS: Lower chest: There are bilateral pleural effusions with associated atelectasis or consolidation and a large, masslike consolidation of the dependent left lung base measuring at least 4.9 cm, which appears to be enlarged compared to prior CT of the chest dated 06/09/2018 (series 3, image 8). There is paravertebral fluid seen about the partially included anterior lower thoracic spine and diaphragmatic cruces (series 3, image 9). Hepatobiliary: No focal liver abnormality is seen. Hepatic steatosis. No gallstones, gallbladder wall thickening, or biliary dilatation. Pancreas: Unremarkable. No pancreatic ductal dilatation or surrounding inflammatory changes. Spleen: Normal in size without focal abnormality. Adrenals/Urinary Tract: Adrenal glands are unremarkable. Kidneys are normal, without renal calculi, focal lesion, or hydronephrosis. Bladder is unremarkable. Stomach/Bowel: Stomach is within normal limits. Appendix appears normal. Sigmoid diverticulosis. There is mild persistent fat stranding adjacent to the proximal sigmoid colon (series 3, image 60). Vascular/Lymphatic: No significant vascular findings are present. No enlarged abdominal or pelvic lymph nodes. Reproductive: Uterine fibroids. Other: No abdominal wall hernia or abnormality. No abdominopelvic ascites. Musculoskeletal: No acute or significant osseous findings. IMPRESSION: 1. Sigmoid diverticulosis. There  is mild persistent fat stranding adjacent to the proximal sigmoid colon (series 3, image 60). This may reflect persistent diverticulitis/colitis or alternately chronic sequelae. No evidence of worsening colitis or developing complication such as abscess or perforation. 2. No other CT findings of the abdomen or pelvis to explain abdominal distension, nausea, vomiting, or pain. No evidence of bowel obstruction. 3. There are bilateral pleural effusions  with associated atelectasis or consolidation and a large, masslike consolidation of the dependent left lung base measuring at least 4.9 cm, which appears to be enlarged compared to prior CT of the chest dated 06/09/2018 (series 3, image 8). 4. There is paravertebral fluid seen about the partially included anterior lower thoracic spine and diaphragmatic cruces (series 3, image 9). Consider dedicated chest imaging to further evaluate interval change in these findings. Electronically Signed   By: Eddie Candle M.D.   On: 06/14/2018 12:01     Time spent: 35 minutes  Author: Berle Mull, MD [facility] 06/14/2018 8:07 PM  To reach On-call, see care teams to locate the attending and reach out to them via [email or web address]. If 7PM-7AM, please contact night-coverage If you still have difficulty reaching the attending provider, please page the [facility] (Director on Call) for [facility] on amion for assistance.

## 2018-06-15 DIAGNOSIS — J918 Pleural effusion in other conditions classified elsewhere: Secondary | ICD-10-CM

## 2018-06-15 DIAGNOSIS — R140 Abdominal distension (gaseous): Secondary | ICD-10-CM

## 2018-06-15 DIAGNOSIS — K529 Noninfective gastroenteritis and colitis, unspecified: Secondary | ICD-10-CM

## 2018-06-15 DIAGNOSIS — R197 Diarrhea, unspecified: Secondary | ICD-10-CM

## 2018-06-15 DIAGNOSIS — B371 Pulmonary candidiasis: Secondary | ICD-10-CM

## 2018-06-15 LAB — CBC
HCT: 35.9 % — ABNORMAL LOW (ref 36.0–46.0)
Hemoglobin: 11.6 g/dL — ABNORMAL LOW (ref 12.0–15.0)
MCH: 28.3 pg (ref 26.0–34.0)
MCHC: 32.3 g/dL (ref 30.0–36.0)
MCV: 87.6 fL (ref 80.0–100.0)
Platelets: 507 10*3/uL — ABNORMAL HIGH (ref 150–400)
RBC: 4.1 MIL/uL (ref 3.87–5.11)
RDW: 14.1 % (ref 11.5–15.5)
WBC: 13.9 10*3/uL — ABNORMAL HIGH (ref 4.0–10.5)
nRBC: 0 % (ref 0.0–0.2)

## 2018-06-15 LAB — BASIC METABOLIC PANEL
Anion gap: 10 (ref 5–15)
BUN: 12 mg/dL (ref 8–23)
CO2: 30 mmol/L (ref 22–32)
Calcium: 8.4 mg/dL — ABNORMAL LOW (ref 8.9–10.3)
Chloride: 95 mmol/L — ABNORMAL LOW (ref 98–111)
Creatinine, Ser: 0.55 mg/dL (ref 0.44–1.00)
GFR calc Af Amer: >60 mL/min (ref >60)
GFR calc non Af Amer: >60 mL/min (ref >60)
Glucose, Bld: 138 mg/dL — ABNORMAL HIGH (ref 70–99)
Potassium: 4.1 mmol/L (ref 3.5–5.1)
Sodium: 135 mmol/L (ref 135–145)

## 2018-06-15 LAB — STREP PNEUMONIAE URINARY ANTIGEN: Strep Pneumo Urinary Antigen: NEGATIVE

## 2018-06-15 LAB — C-REACTIVE PROTEIN: CRP: 38.1 mg/dL — ABNORMAL HIGH (ref <1.0)

## 2018-06-15 LAB — SEDIMENTATION RATE: Sed Rate: 124 mm/hr — ABNORMAL HIGH (ref 0–22)

## 2018-06-15 MED ORDER — FUROSEMIDE 10 MG/ML IJ SOLN
40.0000 mg | Freq: Two times a day (BID) | INTRAMUSCULAR | Status: DC
  Administered 2018-06-15 – 2018-06-16 (×3): 40 mg via INTRAVENOUS
  Filled 2018-06-15 (×3): qty 4

## 2018-06-15 NOTE — Progress Notes (Signed)
Call from [facility] resident on ID service about bronch indication  - case reviewed- looks necrotic. Patient not immune supprssed  - bronch will have very low yield unless MTb, or PCP or fungal suspected  - recommend - checking RVP, urine strep, urine leg if clinically indicated + autoimmune and vasculitis (GBM, MPO, PR3, ESR)   PCCM will be available based on course and results     SIGNATURE    Dr. Brand Males, M.D., F.C.C.P,  Pulmonary and Critical Care Medicine Staff Physician, [place] Director - Interstitial Lung Disease  Program  Pulmonary [place] [facility] [place], [place], [address]  Pager: [phone], If no answer or between  15:00h - 7:00h: call [phone] Telephone: [phone]  9:29 AM 06/15/2018

## 2018-06-15 NOTE — Plan of Care (Signed)
  Problem: Pain Managment: Goal: General experience of comfort will improve Outcome: Progressing   Frequent pain assessments, pt states she does not want pain medication and using repositioning techniques is relieving her pain adequately.

## 2018-06-15 NOTE — Care Management Important Message (Signed)
Important Message  Patient Details  Name: Marysol Wellnitz MRN: [id number] Date of Birth: 07-Apr-1950   Medicare Important Message Given:  Yes    Delesha Pohlman 06/15/2018, 3:05 PM

## 2018-06-15 NOTE — Progress Notes (Addendum)
[place] for Infectious Disease  Date of Admission:  06/09/2018   Total days of antibiotics 6        Day 6 Meropenum         ASSESSMENT: Pneumonia and mass like consolidation of the LL lung base that has continued to increase in size since admission as viewed on CT. From a respiratory status she is clinically improving but has a persistent mild fever. I discussed bronchoscopy with pulmonary who feel this would be low yield for diagnostic purposes and recommended autoimmune evaluation as well as further investigation into a primary pulmonary infection. They feel the lesion appears necrotic which could result from CAP. Fungal infection less likely given lack of immunosuppression and improvement but given symptoms thus far should remain in consideration if she fails to improve. I feel an RVP would be low yield.  She reported loose watery brown stool with >4 bowel movements per day, abdominal distention and pain confirmed to b 6 days of duration. Started on simethicone x2 days prior but no recent laxative use. Now with colitis on CT. Recently on a course of ciprofloxacin. Patient stated she underwent colonoscopy in [place] approximately 1 year prior for rectal bleeding. She did not rememberl the results and could not assist family with locating the report. Will rule out C. Diff which could explain her persistent fevers while on appropriate CAP coverage with improvement in her respiratory status and overall symptoms.   PLAN: 1. Continue Meropenum 2. Ordered C. Diff testing 3. Ordered urinary strep/legionella   Principal Problem:   Pneumonia Active Problems:   Mass of lower lobe of left lung   Colitis   Paravertebral mass   Scheduled Meds: . benzonatate  100 mg Oral TID  . chlorpheniramine-HYDROcodone  5 mL Oral Q12H  . furosemide  40 mg Intravenous Daily  . guaiFENesin  600 mg Oral BID  . methocarbamol  500 mg Oral TID  . senna-docusate  1 tablet Oral BID  . sodium  chloride flush  3 mL Intravenous Q12H   Continuous Infusions: . meropenem (MERREM) IV 1 g (06/15/18 0654)   PRN Meds:.acetaminophen **OR** acetaminophen, morphine injection, ondansetron **OR** ondansetron (ZOFRAN) IV, oxyCODONE, simethicone, zolpidem   SUBJECTIVE: Patient stated that it is easier for her to breath now, she has less pain with deep inspiration and feels overall slightly better than when she first was admitted. Her most prominent concern today remains her abdominal pain and distention with her loose watery stool.  Review of Systems: Review of Systems  Constitutional: Negative for chills, diaphoresis and fever.  HENT: Negative for ear pain and sinus pain.   Eyes: Negative for redness.  Respiratory: Positive for cough, hemoptysis, sputum production and shortness of breath. Negative for wheezing.   Cardiovascular: Positive for chest pain (With deep inspiration only). Negative for leg swelling.  Gastrointestinal: Positive for abdominal pain, blood in stool (Does not think so), diarrhea (X6 days, confirmed twice with interpreter) and nausea. Negative for constipation (Prior history of constipation, non recently), melena and vomiting.  Genitourinary: Negative for flank pain and urgency.  Musculoskeletal: Negative for myalgias.  Neurological: Negative for dizziness and headaches.  Psychiatric/Behavioral: Negative for depression. The patient is not nervous/anxious.     Allergies  Allergen Reactions  . Penicillins Anaphylaxis    Patient stated that within a few minutes of receiving a penicillin IM injection she rapidly became unconscious and required "additional" medication to recover. This occurred when she was ~68 years  old and she does not recall what was given to assist in her recovery. She did not develop a rash.     OBJECTIVE: Vitals:   06/14/18 0800 06/14/18 1558 06/14/18 2355 06/15/18 0753  BP: 116/78 99/63 116/73 117/67  Pulse: (!) 104 (!) 106 98 94  Resp: 20 16 20  13   Temp: 99.4 F (37.4 C) 100.1 F (37.8 C) (!) 100.7 F (38.2 C) 98.7 F (37.1 C)  TempSrc: Oral Oral Oral Oral  SpO2: 100% 97% 97% 99%  Weight:      Height:       Body mass index is 32.15 kg/m.  Physical Exam Constitutional:      General: She is not in acute distress.    Appearance: She is well-developed. She is not diaphoretic.  Neck:     Musculoskeletal: Normal range of motion and neck supple.  Cardiovascular:     Rate and Rhythm: Regular rhythm. Tachycardia present.     Heart sounds: No murmur.  Pulmonary:     Effort: Pulmonary effort is normal. No respiratory distress.     Breath sounds: Normal breath sounds. No stridor.  Abdominal:     General: Bowel sounds are normal. There is distension.     Tenderness: There is no guarding or rebound.  Musculoskeletal:        General: No tenderness.  Skin:    General: Skin is warm.     Capillary Refill: Capillary refill takes less than 2 seconds.  Neurological:     Mental Status: She is alert and oriented to person, place, and time.     Lab Results Lab Results  Component Value Date   WBC 13.9 (H) 06/15/2018   HGB 11.6 (L) 06/15/2018   HCT 35.9 (L) 06/15/2018   MCV 87.6 06/15/2018   PLT 507 (H) 06/15/2018    Lab Results  Component Value Date   CREATININE 0.55 06/15/2018   BUN 12 06/15/2018   NA 135 06/15/2018   K 4.1 06/15/2018   CL 95 (L) 06/15/2018   CO2 30 06/15/2018    Lab Results  Component Value Date   ALT 114 (H) 06/09/2018   AST 47 (H) 06/09/2018   ALKPHOS 126 06/09/2018   BILITOT 0.4 06/09/2018     Microbiology: Recent Results (from the past 240 hour(s))  MRSA PCR Screening     Status: None   Collection Time: 06/09/18  3:45 PM  Result Value Ref Range Status   MRSA by PCR NEGATIVE NEGATIVE Final    Comment:        The GeneXpert MRSA Assay (FDA approved for NASAL specimens only), is one component of a comprehensive MRSA colonization surveillance program. It is not intended to diagnose MRSA  infection nor to guide or monitor treatment for MRSA infections. Performed at [facility], [place] [address], [place], [place] [address]   Culture, blood (routine x 2)     Status: None   Collection Time: 06/09/18  5:00 PM  Result Value Ref Range Status   Specimen Description BLOOD LEFT ANTECUBITAL  Final   Special Requests   Final    BOTTLES DRAWN AEROBIC ONLY Blood Culture adequate volume   Culture   Final    NO GROWTH 5 DAYS Performed at [facility], [place] [address], [place], [place] [address]    Report Status 06/14/2018 FINAL  Final  Culture, blood (routine x 2)     Status: None   Collection Time: 06/09/18  5:05 PM  Result Value Ref Range Status   Specimen Description BLOOD LEFT ANTECUBITAL  Final   Special Requests   Final    BOTTLES DRAWN AEROBIC ONLY Blood Culture adequate volume   Culture   Final    NO GROWTH 5 DAYS Performed at [facility], [address] [place], [place] [address]    Report Status 06/14/2018 FINAL  Final  Acid Fast Smear (AFB)     Status: None   Collection Time: 06/10/18 10:38 AM  Result Value Ref Range Status   AFB Specimen Processing Concentration  Final   Acid Fast Smear Negative  Final    Comment: (NOTE) Performed At: [facility] [place], [place] [id number] Rush Farmer MD [id number]    Source (AFB) SPUTUM  Final    Comment: Performed at [facility], [place] [address] [place], [place] [address]  Expectorated sputum assessment w rflx to resp cult     Status: None   Collection Time: 06/12/18  6:44 PM  Result Value Ref Range Status   Specimen Description EXPECTORATED SPUTUM  Final   Special Requests NONE  Final   Sputum evaluation   Final    THIS SPECIMEN IS ACCEPTABLE FOR SPUTUM CULTURE Performed at [facility], [place] [address], [place], [place] [address]    Report Status 06/12/2018 FINAL  Final  Acid Fast Smear (AFB)     Status: None   Collection Time: 06/12/18  6:44 PM  Result  Value Ref Range Status   AFB Specimen Processing Concentration  Final   Acid Fast Smear Negative  Final    Comment: (NOTE) Performed At: [facility] [place], [place] [id number] Rush Farmer MD [id number]    Source (AFB) SPUTUM  Final    Comment: Performed at [facility], [place] [address] [place], [place] [address]  Culture, respiratory     Status: None   Collection Time: 06/12/18  6:44 PM  Result Value Ref Range Status   Specimen Description EXPECTORATED SPUTUM  Final   Special Requests NONE Reflexed from Q1975  Final   Gram Stain   Final    ABUNDANT WBC PRESENT, PREDOMINANTLY PMN FEW GRAM POSITIVE RODS FEW YEAST Performed at [facility], [place] [address] [place], [place] [address]    Culture FEW CANDIDA ALBICANS  Final   Report Status 06/14/2018 FINAL  Final    Kathi Ludwig, [place] [facility] [phone] pager   [phone] cell 06/15/2018, 9:09 AM

## 2018-06-15 NOTE — Progress Notes (Signed)
[facility] Progress Note  Patient: Julia Johns SWN:[id number]   PCP: Patient, No Pcp Per DOB: [birth year]   DOA: 06/09/2018   DOS: 06/15/2018   Date of Service: the patient was seen and examined on 06/15/2018  Brief hospital course: Pt. with no significant PMH; admitted on 06/09/2018, presented with complaint of cough and fever, was found to have CAP. Currently further plan is continue IV Antibiotics.  Subjective: Continues to have cough.  No abdominal pain no nausea no vomiting.  Had some loose BM this morning but no further diarrhea.  No fever no chills.  Continues to have right-sided back pain.  Sputum still has blood in it.  Assessment and Plan: Acute hypoxemic respiratory failure requiring nasal cannula O2 supplementation Left lower lobe rounded density, in the setting of fever cough sputum production, scant hemoptysis, leukocytosis community-acquired pneumonia start with sputum cultures as well as AFB smear, per infectious disease PCCM consulted, recommended defer bronchoscopy and plan for repeat imaging in 4 to 6 weeks to ensure resolution of left lower lobe density if there is no resolution of the density on imaging after treatment she will need bronchoscopy QuantiFERON-TB gold is indeterminant. AFB smear negative x2. per ID patient can come off of isolation. Provide Tussionex and other cough suppressant. PCCM recommends no indication for bronc for now. Recommend further work-up for vasculitis which is currently ordered. ID currently following.  C. difficile ordered due to diarrhea.  Constipation. Provide stool softener.Hold for now.  Right upper lobe pulmonary nodule She will need follow-up for the small subcentimeter right upper lobe pulmonary nodule, follow-up for this will be noncontrasted CT 9 to 12 months.  Abdominal pain. Colitis. Nausea and vomiting. Due to her worsening severe abdominal pain as well as nausea and vomiting this morning a CT abdomen  pelvis with contrast was performed. Evidence of persistent colitis without any worsening. No other acute abnormality.  Diet: Cardiac diet DVT Prophylaxis: subcutaneous Heparin  Advance goals of care discussion: Full code  Family Communication: no family was present at bedside, at the time of interview.   Disposition:  Discharge to be determined .  Consultants: Neurology, neurosurgery, pulmonary, cardiothoracic surgery, infectious disease Procedures: none  Scheduled Meds: . benzonatate  100 mg Oral TID  . chlorpheniramine-HYDROcodone  5 mL Oral Q12H  . furosemide  40 mg Intravenous BID  . guaiFENesin  600 mg Oral BID  . methocarbamol  500 mg Oral TID  . senna-docusate  1 tablet Oral BID  . sodium chloride flush  3 mL Intravenous Q12H   Continuous Infusions: . meropenem (MERREM) IV 1 g (06/15/18 1645)   PRN Meds: acetaminophen **OR** acetaminophen, morphine injection, ondansetron **OR** ondansetron (ZOFRAN) IV, oxyCODONE, simethicone, zolpidem Antibiotics: Anti-infectives (From admission, onward)   Start     Dose/Rate Route Frequency Ordered Stop   06/10/18 1400  meropenem (MERREM) 1 g in sodium chloride 0.9 % 100 mL IVPB     1 g 200 mL/hr over 30 Minutes Intravenous Every 8 hours 06/10/18 1120         Objective: Physical Exam: Vitals:   06/14/18 1558 06/14/18 2355 06/15/18 0753 06/15/18 1610  BP: 99/63 116/73 117/67 103/62  Pulse: (!) 106 98 94 (!) 104  Resp: 16 20 13 16   Temp: 100.1 F (37.8 C) (!) 100.7 F (38.2 C) 98.7 F (37.1 C) 99.2 F (37.3 C)  TempSrc: Oral Oral Oral Oral  SpO2: 97% 97% 99% 98%  Weight:      Height:  Intake/Output Summary (Last 24 hours) at 06/15/2018 1829 Last data filed at 06/15/2018 1130 Gross per 24 hour  Intake 194.12 ml  Output -  Net 194.12 ml   Filed Weights   06/09/18 1545  Weight: 93.1 kg   General: Alert, Awake and Oriented to Time, Place and Person. Appear in mild distress, affect appropriate Eyes: PERRL,  Conjunctiva normal ENT: Oral Mucosa clear moist. Neck: no JVD, no Abnormal Mass Or lumps Cardiovascular: S1 and S2 Present, no Murmur, Peripheral Pulses Present Respiratory: normal respiratory effort, Bilateral Air entry equal and Decreased, no use of accessory muscle, Clear to Auscultation, no Crackles, no wheezes Abdomen: Bowel Sound present, Soft and no tenderness, no hernia Skin: no redness, no Rash, no induration Extremities: no Pedal edema, no calf tenderness Neurologic: Grossly no focal neuro deficit. Bilaterally Equal motor strength  Data Reviewed: CBC: Recent Labs  Lab 06/10/18 0619 06/11/18 0649 06/13/18 0944 06/14/18 0528 06/15/18 0540  WBC 12.2* 11.4* 12.3* 14.0* 13.9*  NEUTROABS 10.5* 10.0*  --   --   --   HGB 12.3 11.8* 11.5* 12.0 11.6*  HCT 38.2 36.6 35.8* 38.1 35.9*  MCV 88.4 88.8 87.3 89.2 87.6  PLT 454* 425* 466* 501* 761*   Basic Metabolic Panel: Recent Labs  Lab 06/09/18 2121 06/11/18 0649 06/13/18 0944 06/14/18 0528 06/15/18 0540  NA 135 136 134* 135 135  K 3.9 4.2 3.4* 4.3 4.1  CL 102 103 95* 98 95*  CO2 25 25 27 27 30   GLUCOSE 150* 165* 163* 154* 138*  BUN <5* 11 12 12 12   CREATININE 0.54 0.76 0.59 0.53 0.55  CALCIUM 8.5* 8.4* 8.4* 8.5* 8.4*    Liver Function Tests: Recent Labs  Lab 06/09/18 2121  AST 47*  ALT 114*  ALKPHOS 126  BILITOT 0.4  PROT 6.7  ALBUMIN 2.6*   No results for input(s): LIPASE, AMYLASE in the last 168 hours. No results for input(s): AMMONIA in the last 168 hours. Coagulation Profile: No results for input(s): INR, PROTIME in the last 168 hours. Cardiac Enzymes: No results for input(s): CKTOTAL, CKMB, CKMBINDEX, TROPONINI in the last 168 hours. BNP (last 3 results) No results for input(s): PROBNP in the last 8760 hours. CBG: No results for input(s): GLUCAP in the last 168 hours. Studies: No results found.   Time spent: 35 minutes  Author: Berle Mull, MD [facility] 06/15/2018 6:29 PM  To reach  On-call, see care teams to locate the attending and reach out to them via [email or web address]. If 7PM-7AM, please contact night-coverage If you still have difficulty reaching the attending provider, please page the [facility] (Director on Call) for [facility] on amion for assistance.

## 2018-06-16 ENCOUNTER — Inpatient Hospital Stay (HOSPITAL_COMMUNITY): Payer: Medicare HMO

## 2018-06-16 DIAGNOSIS — I5022 Chronic systolic (congestive) heart failure: Secondary | ICD-10-CM

## 2018-06-16 DIAGNOSIS — R011 Cardiac murmur, unspecified: Secondary | ICD-10-CM

## 2018-06-16 DIAGNOSIS — R1030 Lower abdominal pain, unspecified: Secondary | ICD-10-CM

## 2018-06-16 LAB — BLOOD GAS, ARTERIAL
Acid-Base Excess: 10.8 mmol/L — ABNORMAL HIGH (ref 0.0–2.0)
Bicarbonate: 35.3 mmol/L — ABNORMAL HIGH (ref 20.0–28.0)
Drawn by: 559801
O2 Content: 10 L/min
O2 Saturation: 96.1 %
Patient temperature: 98.4
pCO2 arterial: 51.4 mmHg — ABNORMAL HIGH (ref 32.0–48.0)
pH, Arterial: 7.451 — ABNORMAL HIGH (ref 7.350–7.450)
pO2, Arterial: 82.2 mmHg — ABNORMAL LOW (ref 83.0–108.0)

## 2018-06-16 LAB — BASIC METABOLIC PANEL
Anion gap: 11 (ref 5–15)
BUN: 13 mg/dL (ref 8–23)
CO2: 32 mmol/L (ref 22–32)
Calcium: 8.4 mg/dL — ABNORMAL LOW (ref 8.9–10.3)
Chloride: 92 mmol/L — ABNORMAL LOW (ref 98–111)
Creatinine, Ser: 0.53 mg/dL (ref 0.44–1.00)
GFR calc Af Amer: >60 mL/min (ref >60)
GFR calc non Af Amer: >60 mL/min (ref >60)
Glucose, Bld: 151 mg/dL — ABNORMAL HIGH (ref 70–99)
Potassium: 3.7 mmol/L (ref 3.5–5.1)
Sodium: 135 mmol/L (ref 135–145)

## 2018-06-16 LAB — ECHOCARDIOGRAM COMPLETE
Height: 67 in
Weight: 3283.97 oz

## 2018-06-16 LAB — LEGIONELLA PNEUMOPHILA SEROGP 1 UR AG: L. pneumophila Serogp 1 Ur Ag: NEGATIVE

## 2018-06-16 LAB — CBC
HCT: 35.1 % — ABNORMAL LOW (ref 36.0–46.0)
Hemoglobin: 11.3 g/dL — ABNORMAL LOW (ref 12.0–15.0)
MCH: 28.1 pg (ref 26.0–34.0)
MCHC: 32.2 g/dL (ref 30.0–36.0)
MCV: 87.3 fL (ref 80.0–100.0)
Platelets: 554 10*3/uL — ABNORMAL HIGH (ref 150–400)
RBC: 4.02 MIL/uL (ref 3.87–5.11)
RDW: 14 % (ref 11.5–15.5)
WBC: 14.9 10*3/uL — ABNORMAL HIGH (ref 4.0–10.5)
nRBC: 0.2 % (ref 0.0–0.2)

## 2018-06-16 MED ORDER — IOHEXOL 350 MG/ML SOLN
80.0000 mL | Freq: Once | INTRAVENOUS | Status: AC | PRN
  Administered 2018-06-16: 80 mL via INTRAVENOUS

## 2018-06-16 MED ORDER — PHENOL 1.4 % MT LIQD
1.0000 | OROMUCOSAL | Status: DC | PRN
  Administered 2018-06-16: 1 via OROMUCOSAL
  Filled 2018-06-16: qty 177

## 2018-06-16 MED ORDER — IPRATROPIUM-ALBUTEROL 0.5-2.5 (3) MG/3ML IN SOLN: 3.0000 mL | Freq: Four times a day (QID) | RESPIRATORY_TRACT | Status: DC | PRN

## 2018-06-16 MED ORDER — MENTHOL 3 MG MT LOZG
1.0000 | LOZENGE | OROMUCOSAL | Status: DC | PRN
  Administered 2018-06-16 – 2018-06-18 (×3): 3 mg via ORAL
  Filled 2018-06-16 (×2): qty 9

## 2018-06-16 MED ORDER — FUROSEMIDE 10 MG/ML IJ SOLN
40.0000 mg | Freq: Once | INTRAMUSCULAR | Status: AC
  Administered 2018-06-16: 40 mg via INTRAVENOUS
  Filled 2018-06-16: qty 4

## 2018-06-16 MED ORDER — FUROSEMIDE 10 MG/ML IJ SOLN
80.0000 mg | Freq: Two times a day (BID) | INTRAMUSCULAR | Status: DC
  Administered 2018-06-17 – 2018-06-26 (×20): 80 mg via INTRAVENOUS
  Filled 2018-06-16 (×21): qty 8

## 2018-06-16 NOTE — Progress Notes (Signed)
PT Cancellation Note  Patient Details Name: Julia Johns MRN: [id number] DOB: 13-Feb-1950   Cancelled Treatment:    Reason Eval/Treat Not Completed: Patient at procedure or test/unavailable(echo, will follow)   Duncan Dull 06/16/2018, 12:40 PM

## 2018-06-16 NOTE — Progress Notes (Signed)
Patient ID: Kynley Metzger, female   DOB: 07/16/1950, 68 y.o.   MRN: [id number] TCTS  I was asked to evaluate her enlarging LLL lesion seen on her abdominal and pelvic CT from 06/14/2018. This has increased I size on successive CT scans from 1.7 cm on 5/13 to 4.9 cm now. It is within the lower lobe and looks like a developing lung abscess but there is no air/fluid level yet. There is a small pleural effusion bilaterally. There is no surgical treatment indicated for this. I would not do a lobectomy in this situation. Surgical drainage of this is not indicated. The effusion is small. I would continue antibiotics for pneumonia/lung abscess and if she develops an air/fluid level then percutaneous drainage would be indicated. If it spontaneously drained into the pleural space and caused an enlarging empyema then thoracotomy may be indicated to drain that but there is no indication of that at this time. We will follow her progress. Probably worthwhile repeating a chest CT to examine the whole chest.

## 2018-06-16 NOTE — Progress Notes (Signed)
Racheal, daughter in law of Ms. Sorian called in regards to patients colonoscopy for diverticulitis that was done in [place]. States that she spoke with Dr. Posey Pronto and was told to give the facilities information to Ms Sorianos Nurse. Information received. Information is below:   Facility:  [facility] [address] [place] [address]  Phone: [phone]  Daughter in law [place] Phone Number: [phone]

## 2018-06-16 NOTE — Progress Notes (Signed)
  Echocardiogram 2D Echocardiogram has been performed.  Keir Viernes G Shaneta Cervenka 06/16/2018, 1:41 PM

## 2018-06-16 NOTE — Progress Notes (Signed)
NAME:  Julia Johns, MRN:  [id number], DOB:  1950-05-06, LOS: 7 ADMISSION DATE:  06/09/2018, CONSULTATION DATE:  06/16/2018  REFERRING MD:  Dr. Posey Pronto, CHIEF COMPLAINT:  Fever, abnormal CT    Brief History   This is a 68 year old female who presented as a transfer from [facility] with complaints of shortness of breath and blood-tinged sputum.  Patient underwent CT imaging which revealed a paraspinal soft tissue thickening from T6-T10 as well as a left lower lobe density within the CT which was enlarged from prior imaging concerning for an inflammatory/infectious etiology.  Patient was found to be febrile.  Patient was admitted to [facility] under airborne isolation, COVID negative, pending workup AFB/ quantiferon.  Pulmonary was consulted for recommendations and management of the left lower lobe density.    Since AFB x 2 negative, quantiferon indeterminate, blood cultures negative, sputum pending.  TCTS consulted who did not recommended any surgical evaluation at that point. IR did not feel that it was safe to to try needle aspiration of the paravertebral fluid.  ID has been following and patient has been on meropenum.  Despite broad spectrum abx, she has continued to have an unclear definitive diagnosis.  Despite meropenum, she has persistent ongoing leukocytosis, cough, SOB, persistent blood-tinged sputum (stable Hgb), and severe back pain.  PCCM re-consulted 5/26 given worsening hypoxia and enlarging LLL masslike consolidation.    Past Medical History  Obesity  Significant Hospital Events   06/09/2018: Admission; tx from [facility]:  PCCM Cardiothoracic surgery-  Consult 5/19 Interventional radiology ID   Procedures:   Significant Diagnostic Tests:  06/09/2018 CTA chest [facility] >> - No definite evidence of large central pulmonary embolus is noted, but smaller peripheral pulmonary emboli in the lower branches of the pulmonary arteries cannot be excluded on  the basis of this exam due to respiratory motion artifact.  - continued presence of prevertebral soft tissue density concerning for possible malignancy or infection - 4.5 x 2.9 cm lobulated density noted in the left lower posteriorly which is slightly enlarged compared to prior exam.  This is concerning for possible mass or neoplasm with surrounding atelectasis or infiltrate. - right lower atelectasis or pneumonia is noted with minimal associated pleural effusion - probable thyroid goiter again noted - hepatic steatosis   5/24 CT abd/ pelvis >> 1. Sigmoid diverticulosis. There is mild persistent fat stranding adjacent to the proximal sigmoid colon (series 3, image 60). This may reflect persistent diverticulitis/colitis or alternately chronic sequelae. No evidence of worsening colitis or developing complication such as abscess or perforation. 2. No other CT findings of the abdomen or pelvis to explain abdominal distension, nausea, vomiting, or pain. No evidence of bowel obstruction. 3. There are bilateral pleural effusions with associated atelectasis or consolidation and a large, masslike consolidation of the dependent left lung base measuring at least 4.9 cm, which appears to be enlarged compared to prior CT of the chest dated 06/09/2018 (series 3, image 8). 4. There is paravertebral fluid seen about the partially included anterior lower thoracic spine and diaphragmatic cruces (series 3, image 9). Consider dedicated chest imaging to further evaluate interval change in these findings.  TTE 5/26 >> Normal EF 60-65.  No wall motion abnormalities.  Normal RV function.  No effusion.  Micro Data:  5/19 Blood cultures: Negative 5/19 MRSA PCR: Negative 5/20 AFB: negative 5/22 AFB: negative 5/22 expectorated sputum << few candida albicans 5/25 Cdiff >> canceled  5/25 BC x 1 >> ngtd  5/19 QuantiFERON:  indeterminate 5/19 HIV neg 5/25 urine strep and legionella negative  Antimicrobials:  5/20  Meropenem >>  Interim history/subjective:  Asked to see again in reference to enlarged LLL masslike consolidation and worsening hypoxia (since 5/24) now on 10 L salter.   WBC 12-> 11.4-> 14-> 13.9 -> 14.9 tmax 100.7 Net +7.5L   Video interpreter used.  Patient with ongoing cough, persistent blood-tinged sputum (stable Hgb), and back pain.  Does not prefer to lay in bed because laying down makes her cough.  Prefers sitting in chair.  Reports her cough is white and blood-tinged.    Objective   Blood pressure 122/86, pulse 98, temperature 98.4 F (36.9 C), temperature source Oral, resp. rate 16, height 5\' 7"  (1.702 m), weight 93.1 kg, SpO2 99 %.       No intake or output data in the 24 hours ending 06/16/18 1434 Filed Weights   06/09/18 1545  Weight: 93.1 kg    Examination: General:  Short (not 5'7), morbidly obese Hispanic / non English speaking female sitting in  Bedside chair in no distress.  Was seen ambulating in room without much distress.  Upon laying down in bed, begins to cough- nonproductive while I am there. HEENT: MM pink/moist Neuro: Alert/ oriented, MAE CV: rr, no murmur PULM: even/non-labored, left posterior with wheezes/ rhonchi, diminished in bases, on salter 10L taken down to 7 L with lowest saturation 93%, speaking full sentences without SOB, but coughing GI: obese, soft, non-tender, bsactive  Extremities: warm/dry, 3+ LE edema  Skin: no rashes    Resolved Hospital Problem list    Assessment & Plan:   Acute hypoxemic respiratory failure  Left lower lobe rounded density, in the setting of fever cough sputum production, persistent blood tinged sputum, leukocytosis Small bilateral pleural effusions - unclear definitive diagnosis.  - r/o TB- AFBs neg x 2, Quantiferon TB indeterminate  - enlarging masslike consolidation in LLL despite meropenum since 5/20 - net +7.5 L  P:  Supplemental O2 for sat goal > 92% Would be better monitored in PCU status Discussed  with attending, at this point, given rapid expansion of consolidation-> likely infectious.  Given her O2 requirements, >4 liters, she would require intubation and mechanical ventilation for bronchoscopy. She is currently in no acute distress/ WOB.  Dr. [name] has placed a call into IR to speak with Radiologist for consideration of possible needle aspiration.  ID following, today recommended re-consulting TCTS for VATS  ABG shows compensation- 7.451/51/82/35.3 on 10L TTE normal Diuresis per primary team as BP/ renal tolerated Pending BLE venous doppler studies (CTA chest 5/19 limited study- no acute evidence of PE) Pending repeat BC duoneb prn   Right upper lobe pulmonary nodule She will need follow-up for the small subcentimeter right upper lobe pulmonary nodule, follow-up for this will be noncontrasted CT 9 to 12 months.  PCCM will continue to follow  Labs   CBC: Recent Labs  Lab 06/10/18 0619 06/11/18 0649 06/13/18 0944 06/14/18 0528 06/15/18 0540 06/16/18 0436  WBC 12.2* 11.4* 12.3* 14.0* 13.9* 14.9*  NEUTROABS 10.5* 10.0*  --   --   --   --   HGB 12.3 11.8* 11.5* 12.0 11.6* 11.3*  HCT 38.2 36.6 35.8* 38.1 35.9* 35.1*  MCV 88.4 88.8 87.3 89.2 87.6 87.3  PLT 454* 425* 466* 501* 507* 554*    Basic Metabolic Panel: Recent Labs  Lab 06/11/18 0649 06/13/18 0944 06/14/18 0528 06/15/18 0540 06/16/18 0436  NA 136 134* 135 135 135  K 4.2 3.4* 4.3 4.1 3.7  CL 103 95* 98 95* 92*  CO2 25 27 27 30  32  GLUCOSE 165* 163* 154* 138* 151*  BUN 11 12 12 12 13   CREATININE 0.76 0.59 0.53 0.55 0.53  CALCIUM 8.4* 8.4* 8.5* 8.4* 8.4*   GFR: Estimated Creatinine Clearance: 78.8 mL/min (by C-G formula based on SCr of 0.53 mg/dL). Recent Labs  Lab 06/13/18 0944 06/14/18 0528 06/15/18 0540 06/16/18 0436  WBC 12.3* 14.0* 13.9* 14.9*    Liver Function Tests: Recent Labs  Lab 06/09/18 2121  AST 47*  ALT 114*  ALKPHOS 126  BILITOT 0.4  PROT 6.7  ALBUMIN 2.6*   No results for  input(s): LIPASE, AMYLASE in the last 168 hours. No results for input(s): AMMONIA in the last 168 hours.  ABG No results found for: PHART, PCO2ART, PO2ART, HCO3, TCO2, ACIDBASEDEF, O2SAT   Coagulation Profile: No results for input(s): INR, PROTIME in the last 168 hours.  Cardiac Enzymes: No results for input(s): CKTOTAL, CKMB, CKMBINDEX, TROPONINI in the last 168 hours.  HbA1C: No results found for: HGBA1C  CBG: No results for input(s): GLUCAP in the last 168 hours.     Kennieth Rad, MSN, AGACNP-BC [facility] Pgr: [phone] or if no answer [phone] 06/16/2018, 2:35 PM

## 2018-06-16 NOTE — Progress Notes (Signed)
[facility] Progress Note  Patient: Julia Johns XUX:[id number]   PCP: Patient, No Pcp Per DOB: Jul 16, 1950   DOA: 06/09/2018   DOS: 06/16/2018   Date of Service: the patient was seen and examined on 06/16/2018  Brief hospital course: Pt. with no significant PMH; admitted on 06/09/2018, presented with complaint of cough and fever, was found to have CAP. Currently further plan is continue IV Antibiotics.  Subjective: Reports cough with sore throat. Also reports abdominal pain. No nausea no vomiting. Also reports lower chest pain. No dizziness no lightheadedness. Interpreter was used.  Assessment and Plan: Acute hypoxemic respiratory failure Left lower lobe rounded density, in the setting of fever cough sputum production, scant hemoptysis, leukocytosis community-acquired pneumonia Acute diastolic heart failure Paraspinal fluid collection Neurosurgery recommends no intervention for now.  Neurology feels that the patient does not require any intervention since it is not affecting spine. Sputum cultures as well as AFB smear, per infectious disease CT surgery recommends no intervention for now. PCCM consulted, recommended defer bronchoscopy and plan for repeat imaging in 4 to 6 weeks to ensure resolution of left lower lobe density. If there is no resolution of the density on imaging after treatment she will need bronchoscopy. QuantiFERON-TB gold is indeterminant. AFB smear negative x2. per ID patient can come off of isolation. ESR 124, CRP 38.1 ANCA titers, GBM titers currently pending. HIV antibody negative. MRSA PCR negative COVID x2 negative in the other facility Provide Tussionex and other cough suppressant. PCCM recommends no indication for bronc for now. Recommend further work-up for vasculitis which is currently ordered. ID currently following. We will continue with IV diuresis aggressively. Patient's in and out are not accurate at all.  Recommend purvick for now.   Bilateral proximal muscle weakness both upper and lower extremity. Patient reports this is ongoing for last 7 days. ESR is elevated. May represent PMR or other inflammatory condition. May also represent association with sepsis. Monitor for now.  Constipation. Provide stool softener.Hold for now.  Right upper lobe pulmonary nodule She will need follow-up for the small subcentimeter right upper lobe pulmonary nodule, follow-up for this will be noncontrasted CT 9 to 12 months.  Abdominal pain. Colitis. Nausea and vomiting. Due to her worsening severe abdominal pain as well as nausea and vomiting a CT abdomen pelvis with contrast was performed. Evidence of persistent colitis without any worsening. No other acute abnormality. C. difficile was ordered but not sent as the patient did not have any further diarrhea.  Diet: Cardiac diet DVT Prophylaxis: subcutaneous Heparin  Advance goals of care discussion: Full code  Family Communication: no family was present at bedside, at the time of interview.  Discussed with daughter-in-law on 06/16/2018.  Disposition:  Discharge to be determined .  Consultants: Neurology, neurosurgery, pulmonary, cardiothoracic surgery, infectious disease Procedures: none  Scheduled Meds: . benzonatate  100 mg Oral TID  . chlorpheniramine-HYDROcodone  5 mL Oral Q12H  . [START ON 06/17/2018] furosemide  80 mg Intravenous BID  . guaiFENesin  600 mg Oral BID  . methocarbamol  500 mg Oral TID  . sodium chloride flush  3 mL Intravenous Q12H   Continuous Infusions: . meropenem (MERREM) IV 1 g (06/16/18 1354)   PRN Meds: acetaminophen **OR** acetaminophen, ipratropium-albuterol, menthol-cetylpyridinium, morphine injection, ondansetron **OR** ondansetron (ZOFRAN) IV, oxyCODONE, phenol, simethicone, zolpidem Antibiotics: Anti-infectives (From admission, onward)   Start     Dose/Rate Route Frequency Ordered Stop   06/10/18 1400  meropenem (MERREM) 1 g in sodium  chloride 0.9 %  100 mL IVPB     1 g 200 mL/hr over 30 Minutes Intravenous Every 8 hours 06/10/18 1120         Objective: Physical Exam: Vitals:   06/16/18 0759 06/16/18 1445 06/16/18 1525 06/16/18 1950  BP: 122/86   100/66  Pulse: 98 97  (!) 107  Resp:  18  18  Temp: 98.4 F (36.9 C)   98 F (36.7 C)  TempSrc: Oral   Oral  SpO2: 99% 96% 94% 97%  Weight:      Height:        Intake/Output Summary (Last 24 hours) at 06/16/2018 1958 Last data filed at 06/16/2018 1800 Gross per 24 hour  Intake 230.19 ml  Output 200 ml  Net 30.19 ml   Filed Weights   06/09/18 1545  Weight: 93.1 kg   General: Alert, Awake and Oriented to Time, Place and Person. Appear in mild distress, affect appropriate Eyes: PERRL, Conjunctiva normal ENT: Oral Mucosa clear moist. Neck: no JVD, no Abnormal Mass Or lumps Cardiovascular: S1 and S2 Present, no Murmur, Peripheral Pulses Present Respiratory: normal respiratory effort, Bilateral Air entry equal and Decreased, no use of accessory muscle, Clear to Auscultation, no Crackles, no wheezes Abdomen: Bowel Sound present, Soft and no tenderness, no hernia Skin: no redness, no Rash, no induration Extremities: no Pedal edema, no calf tenderness Neurologic: Grossly no focal neuro deficit. Bilaterally Equal motor strength  Data Reviewed: CBC: Recent Labs  Lab 06/10/18 0619 06/11/18 0649 06/13/18 0944 06/14/18 0528 06/15/18 0540 06/16/18 0436  WBC 12.2* 11.4* 12.3* 14.0* 13.9* 14.9*  NEUTROABS 10.5* 10.0*  --   --   --   --   HGB 12.3 11.8* 11.5* 12.0 11.6* 11.3*  HCT 38.2 36.6 35.8* 38.1 35.9* 35.1*  MCV 88.4 88.8 87.3 89.2 87.6 87.3  PLT 454* 425* 466* 501* 507* 203*   Basic Metabolic Panel: Recent Labs  Lab 06/11/18 0649 06/13/18 0944 06/14/18 0528 06/15/18 0540 06/16/18 0436  NA 136 134* 135 135 135  K 4.2 3.4* 4.3 4.1 3.7  CL 103 95* 98 95* 92*  CO2 25 27 27 30  32  GLUCOSE 165* 163* 154* 138* 151*  BUN 11 12 12 12 13   CREATININE 0.76  0.59 0.53 0.55 0.53  CALCIUM 8.4* 8.4* 8.5* 8.4* 8.4*    Liver Function Tests: Recent Labs  Lab 06/09/18 2121  AST 47*  ALT 114*  ALKPHOS 126  BILITOT 0.4  PROT 6.7  ALBUMIN 2.6*   No results for input(s): LIPASE, AMYLASE in the last 168 hours. No results for input(s): AMMONIA in the last 168 hours. Coagulation Profile: No results for input(s): INR, PROTIME in the last 168 hours. Cardiac Enzymes: No results for input(s): CKTOTAL, CKMB, CKMBINDEX, TROPONINI in the last 168 hours. BNP (last 3 results) No results for input(s): PROBNP in the last 8760 hours. CBG: No results for input(s): GLUCAP in the last 168 hours. Studies: Ct Angio Chest Pe W Or Wo Contrast  Result Date: 06/16/2018 CLINICAL DATA:  Shortness of breath EXAM: CT ANGIOGRAPHY CHEST WITH CONTRAST TECHNIQUE: Multidetector CT imaging of the chest was performed using the standard protocol during bolus administration of intravenous contrast. Multiplanar CT image reconstructions and MIPs were obtained to evaluate the vascular anatomy. CONTRAST:  27m OMNIPAQUE IOHEXOL 350 MG/ML SOLN COMPARISON:  Jun 09, 2018 CT angiogram chest; chest CT in thoracic MRI Jun 05, 2018 FINDINGS: Cardiovascular: There is no demonstrable pulmonary embolus. There is no thoracic aortic aneurysm or dissection. Visualized great  vessels appear normal. No pericardial effusion or pericardial thickening. Mediastinum/Nodes: There remains diffuse enlargement of the left lobe of the thyroid causing deviation of the trachea in the upper thoracic region toward the right. There is a mildly enhancing mass in the left lobe of the thyroid measuring 2.8 x 2.7 cm. Multiple subcentimeter lymph nodes are evident. No frank thoracic adenopathy evident. The prevertebral soft tissue thickening extending from T6-T10 is better seen on thoracic MR and prior CT with bolus timing not targeted for arterial phase imaging. No new soft tissue lesion evident. No esophageal lesions evident.  Lungs/Pleura: There are there are fairly small pleural effusions bilaterally. There is airspace consolidation in both lower lobes. There is a masslike area in the left lower lobe posteriorly measuring 4.8 x 4.3 cm which is increased in size compared to 11 days prior and likely represents either progression of pneumonia or consolidation surrounding a mas areas of consolidation in both upper lobes and lingular regions have increased. Upper abdomen: There is mild reflux of contrast into the inferior vena cava and hepatic veins. There is hepatic steatosis. Visualized upper abdominal structures otherwise appear unremarkable. Musculoskeletal: There are no blastic or lytic bone lesions. No chest wall lesions evident. IMPRESSION: 1. No demonstrable pulmonary embolus. No thoracic aortic aneurysm or dissection. 2. Fairly small right pleural effusions, slightly larger than on recent studies. Multifocal airspace consolidation, progressed. Suspect consolidation surrounding mass in left lower lobe posteriorly. 3.  Subcentimeter lymph nodes without frank adenopathy. 4. Enlarged left lobe of thyroid with dominant thyroid mass measuring 2.8 x 2.7 cm in the left lobe. It may be prudent to correlate with ultrasound given dominant thyroid mass on the left. 5. The paraspinous lesion from T6-T10 noted on recent studies is better seen on recent thoracic MR and chest CT not bolus timed to optimize arterial vascular visualization. Neoplastic etiology in this paraspinous lesion must be of concern as noted recent thoracic MR. 6. Mild reflux of contrast into the inferior vena cava and hepatic veins may indicate a degree of increase in right heart pressure. 7.  Hepatic steatosis. Electronically Signed   By: Lowella Grip III M.D.   On: 06/16/2018 17:16     Time spent: 35 minutes  Author: Berle Mull, MD [facility] 06/16/2018 7:58 PM  To reach On-call, see care teams to locate the attending and reach out to them via  [email or web address]. If 7PM-7AM, please contact night-coverage If you still have difficulty reaching the attending provider, please page the [facility] (Director on Call) for [facility] on amion for assistance.

## 2018-06-16 NOTE — Progress Notes (Signed)
Julia Johns for Infectious Disease  Date of Admission:  06/09/2018   Total days of antibiotics 7        Day 7 of meropenem                ASSESSMENT: Pneumonia with consolidation of the left lower lung base and associated fluid collection of the paravertebral thoracic spine.  I am uncertain of how the latter contributes. Patient continues to endorse severe pain.  She continues to be short of breath requiring a high rate of supplemental oxygen.  She again demonstrated a temperature of 100.7 overnight and has persistent blood-tinged sputum production. WBC count persistently elevated.  Urinary strep negative with Legionella urinary antigen in process. AFB smear negative x2.  Sputum culture with few gram-positive rods and Candida albicans used likely representing a noncontributory colonizer. This seems unlikely to be a fungal process.  C. difficile sample not collected, patient stated this am that her stool are now described as soft and no longer watery or loose.  PLAN: 1. Continue meropenem 2. Recommend consulting CT Surgery to evaluate the mass and paravertebral fluid for possible VATS 3. Discontinue C. Diff testing and enteric precautions  4. Would consider broadening antibiotic therapy with vancomycin if not continued improvement  Principal Problem:   Pneumonia Active Problems:   Mass of lower lobe of left lung   Colitis   Paravertebral mass   Scheduled Meds: . benzonatate  100 mg Oral TID  . chlorpheniramine-HYDROcodone  5 mL Oral Q12H  . furosemide  40 mg Intravenous BID  . guaiFENesin  600 mg Oral BID  . methocarbamol  500 mg Oral TID  . sodium chloride flush  3 mL Intravenous Q12H   Continuous Infusions: . meropenem (MERREM) IV 1 g (06/16/18 0605)   PRN Meds:.acetaminophen **OR** acetaminophen, morphine injection, ondansetron **OR** ondansetron (ZOFRAN) IV, oxyCODONE, phenol, simethicone, zolpidem  SUBJECTIVE: The patient stated that she feels better  overall each day. Yet she continues to endorse dyspnea without supplemental O2 and pain while lying flat or sitting in a slumped position made worse with deep breaths. The pain is improved when she stands or sits upright and can become severe if she does not rapidly adjust her position. This has prevented adequate sleep.   Review of Systems: Review of Systems  Constitutional: Positive for malaise/fatigue. Negative for chills, diaphoresis and fever.  Respiratory: Positive for cough, hemoptysis, sputum production and shortness of breath. Negative for wheezing.   Cardiovascular: Positive for chest pain (Central, associated with deep inspiration). Negative for palpitations and leg swelling.  Gastrointestinal: Positive for abdominal pain. Negative for diarrhea, nausea and vomiting.  Genitourinary: Negative for dysuria and frequency.  Musculoskeletal: Positive for joint pain and myalgias.  Neurological: Positive for headaches (When coughing). Negative for dizziness.  Psychiatric/Behavioral: Negative for depression.    Allergies  Allergen Reactions  . Penicillins Anaphylaxis    Patient stated that within a few minutes of receiving a penicillin IM injection she rapidly became unconscious and required "additional" medication to recover. This occurred when she was ~68 years old and she does not recall what was given to assist in her recovery. She did not develop a rash.     OBJECTIVE: Vitals:   06/15/18 0753 06/15/18 1610 06/16/18 0006 06/16/18 0759  BP: 117/67 103/62 90/65 122/86  Pulse: 94 (!) 104 91 98  Resp: 13 16    Temp: 98.7 F (37.1 C) 99.2 F (37.3 C) 98.8 F (37.1 C)  98.4 F (36.9 C)  TempSrc: Oral Oral Oral Oral  SpO2: 99% 98% 100% 99%  Weight:      Height:       Body mass index is 32.15 kg/m.  Physical Exam General: A/O x4, in no acute distress, afebrile, nondiaphoretic, appears fatigued Cardio: RRR, no mrg's  Pulmonary: CTA bilaterally, no wheezing or crackles  Abdomen:  Bowel sounds normal, abd distended, tender to palpation of the RUQ and central lower abdomen MSK: BLE nontender, nonedematous Neuro: Alert, conversational, strength 4/5 in the upper and lower extremities bilaterally Psych: Appropriate affect, not depressed in appearance, engages well  Lab Results Lab Results  Component Value Date   WBC 14.9 (H) 06/16/2018   HGB 11.3 (L) 06/16/2018   HCT 35.1 (L) 06/16/2018   MCV 87.3 06/16/2018   PLT 554 (H) 06/16/2018    Lab Results  Component Value Date   CREATININE 0.53 06/16/2018   BUN 13 06/16/2018   NA 135 06/16/2018   K 3.7 06/16/2018   CL 92 (L) 06/16/2018   CO2 32 06/16/2018    Lab Results  Component Value Date   ALT 114 (H) 06/09/2018   AST 47 (H) 06/09/2018   ALKPHOS 126 06/09/2018   BILITOT 0.4 06/09/2018     Microbiology: Recent Results (from the past 240 hour(s))  MRSA PCR Screening     Status: None   Collection Time: 06/09/18  3:45 PM  Result Value Ref Range Status   MRSA by PCR NEGATIVE NEGATIVE Final    Comment:        The GeneXpert MRSA Assay (FDA approved for NASAL specimens only), is one component of a comprehensive MRSA colonization surveillance program. It is not intended to diagnose MRSA infection nor to guide or monitor treatment for MRSA infections. Performed at [facility], [place] [address], [place], [place] [address]   Culture, blood (routine x 2)     Status: None   Collection Time: 06/09/18  5:00 PM  Result Value Ref Range Status   Specimen Description BLOOD LEFT ANTECUBITAL  Final   Special Requests   Final    BOTTLES DRAWN AEROBIC ONLY Blood Culture adequate volume   Culture   Final    NO GROWTH 5 DAYS Performed at [facility], [place] [address] [place], [place] [address]    Report Status 06/14/2018 FINAL  Final  Culture, blood (routine x 2)     Status: None   Collection Time: 06/09/18  5:05 PM  Result Value Ref Range Status   Specimen Description BLOOD LEFT ANTECUBITAL  Final    Special Requests   Final    BOTTLES DRAWN AEROBIC ONLY Blood Culture adequate volume   Culture   Final    NO GROWTH 5 DAYS Performed at [facility], [place] [address] [place], [place] [address]    Report Status 06/14/2018 FINAL  Final  Acid Fast Smear (AFB)     Status: None   Collection Time: 06/10/18 10:38 AM  Result Value Ref Range Status   AFB Specimen Processing Concentration  Final   Acid Fast Smear Negative  Final    Comment: (NOTE) Performed At: [facility] [place], [place] [id number] Rush Farmer MD [id number]    Source (AFB) SPUTUM  Final    Comment: Performed at [facility], [place] [address] [place], [place] [address]  Expectorated sputum assessment w rflx to resp cult     Status: None   Collection  Time: 06/12/18  6:44 PM  Result Value Ref Range Status   Specimen Description EXPECTORATED SPUTUM  Final   Special Requests NONE  Final   Sputum evaluation   Final    THIS SPECIMEN IS ACCEPTABLE FOR SPUTUM CULTURE Performed at [facility], [address], [place], [place] [address]    Report Status 06/12/2018 FINAL  Final  Acid Fast Smear (AFB)     Status: None   Collection Time: 06/12/18  6:44 PM  Result Value Ref Range Status   AFB Specimen Processing Concentration  Final   Acid Fast Smear Negative  Final    Comment: (NOTE) Performed At: [facility] [place], [place] [id number] Rush Farmer MD [id number]    Source (AFB) SPUTUM  Final    Comment: Performed at [facility], [place] [address] [place], [place] [address]  Culture, respiratory     Status: None   Collection Time: 06/12/18  6:44 PM  Result Value Ref Range Status   Specimen Description EXPECTORATED SPUTUM  Final   Special Requests NONE Reflexed from [id number]  Final   Gram Stain   Final    ABUNDANT WBC PRESENT, PREDOMINANTLY PMN FEW GRAM POSITIVE RODS FEW YEAST Performed at [facility], [place] [address], [place], [place]  [address]    Culture FEW CANDIDA ALBICANS  Final   Report Status 06/14/2018 FINAL  Final  Culture, blood (single)     Status: None (Preliminary result)   Collection Time: 06/15/18  5:40 AM  Result Value Ref Range Status   Specimen Description BLOOD RIGHT HAND  Final   Special Requests   Final    BOTTLES DRAWN AEROBIC ONLY Blood Culture adequate volume   Culture   Final    NO GROWTH 1 DAY Performed at [facility], [place] [address], [place], [place] [address]    Report Status PENDING  Incomplete    Kathi Ludwig, [place] [facility] [phone] pager   [phone] cell 06/16/2018, 10:20 AM

## 2018-06-17 ENCOUNTER — Inpatient Hospital Stay (HOSPITAL_COMMUNITY): Payer: Medicare HMO

## 2018-06-17 DIAGNOSIS — K529 Noninfective gastroenteritis and colitis, unspecified: Secondary | ICD-10-CM

## 2018-06-17 DIAGNOSIS — R600 Localized edema: Secondary | ICD-10-CM

## 2018-06-17 DIAGNOSIS — E669 Obesity, unspecified: Secondary | ICD-10-CM

## 2018-06-17 DIAGNOSIS — J9611 Chronic respiratory failure with hypoxia: Secondary | ICD-10-CM

## 2018-06-17 DIAGNOSIS — R609 Edema, unspecified: Secondary | ICD-10-CM

## 2018-06-17 LAB — CBC
HCT: 34 % — ABNORMAL LOW (ref 36.0–46.0)
Hemoglobin: 10.9 g/dL — ABNORMAL LOW (ref 12.0–15.0)
MCH: 28.1 pg (ref 26.0–34.0)
MCHC: 32.1 g/dL (ref 30.0–36.0)
MCV: 87.6 fL (ref 80.0–100.0)
Platelets: 514 10*3/uL — ABNORMAL HIGH (ref 150–400)
RBC: 3.88 MIL/uL (ref 3.87–5.11)
RDW: 14 % (ref 11.5–15.5)
WBC: 14 10*3/uL — ABNORMAL HIGH (ref 4.0–10.5)
nRBC: 0.3 % — ABNORMAL HIGH (ref 0.0–0.2)

## 2018-06-17 LAB — HEPATIC FUNCTION PANEL
ALT: 37 U/L (ref 0–44)
AST: 44 U/L — ABNORMAL HIGH (ref 15–41)
Albumin: 1.8 g/dL — ABNORMAL LOW (ref 3.5–5.0)
Alkaline Phosphatase: 130 U/L — ABNORMAL HIGH (ref 38–126)
Bilirubin, Direct: 0.1 mg/dL (ref 0.0–0.2)
Indirect Bilirubin: 0.6 mg/dL (ref 0.3–0.9)
Total Bilirubin: 0.7 mg/dL (ref 0.3–1.2)
Total Protein: 6.3 g/dL — ABNORMAL LOW (ref 6.5–8.1)

## 2018-06-17 LAB — EXPECTORATED SPUTUM ASSESSMENT W GRAM STAIN, RFLX TO RESP C

## 2018-06-17 LAB — BASIC METABOLIC PANEL
Anion gap: 12 (ref 5–15)
BUN: 17 mg/dL (ref 8–23)
CO2: 33 mmol/L — ABNORMAL HIGH (ref 22–32)
Calcium: 8.2 mg/dL — ABNORMAL LOW (ref 8.9–10.3)
Chloride: 89 mmol/L — ABNORMAL LOW (ref 98–111)
Creatinine, Ser: 0.63 mg/dL (ref 0.44–1.00)
GFR calc Af Amer: >60 mL/min (ref >60)
GFR calc non Af Amer: >60 mL/min (ref >60)
Glucose, Bld: 177 mg/dL — ABNORMAL HIGH (ref 70–99)
Potassium: 3.7 mmol/L (ref 3.5–5.1)
Sodium: 134 mmol/L — ABNORMAL LOW (ref 135–145)

## 2018-06-17 LAB — GLOMERULAR BASEMENT MEMBRANE ANTIBODIES: GBM Ab: 3 units (ref 0–20)

## 2018-06-17 LAB — MAGNESIUM: Magnesium: 2.2 mg/dL (ref 1.7–2.4)

## 2018-06-17 MED ORDER — VANCOMYCIN HCL 10 G IV SOLR
1250.0000 mg | Freq: Two times a day (BID) | INTRAVENOUS | Status: DC
  Administered 2018-06-17 – 2018-06-20 (×6): 1250 mg via INTRAVENOUS
  Filled 2018-06-17 (×6): qty 1250

## 2018-06-17 MED ORDER — LIP MEDEX EX OINT
TOPICAL_OINTMENT | CUTANEOUS | Status: DC | PRN
  Filled 2018-06-17: qty 7

## 2018-06-17 MED ORDER — VANCOMYCIN HCL 10 G IV SOLR
2000.0000 mg | Freq: Once | INTRAVENOUS | Status: AC
  Administered 2018-06-17: 2000 mg via INTRAVENOUS
  Filled 2018-06-17: qty 2000

## 2018-06-17 NOTE — Progress Notes (Signed)
Pt fatigues easily and dyspneic on exertion (when pivoting to bsc).  O2 sats remain stable 97% 2.5L [place].

## 2018-06-17 NOTE — Progress Notes (Signed)
Physical Therapy Evaluation Patient Details Name: Julia Johns MRN: [id number] DOB: 10/27/1950 Today's Date: 06/17/2018   History of Present Illness  This is a 68 year old female who presented as a transfer from [facility] with complaints of shortness of breath and blood-tinged sputum.  Patient underwent CT imaging which revealed a paraspinal soft tissue thickening from T6-T10 as well as a left lower lobe density within the CT which was enlarged from prior imaging concerning for an inflammatory/infectious etiology.  Patient was found to be febrile.  Patient was admitted to [facility] under airborne isolation, COVID negative, pending workup AFB/ quantiferon.  Pulmonary was consulted for recommendations and management of the left lower lobe density.     Clinical Impression  Pt admitted with above diagnosis. Pt currently with functional limitations due to the deficits listed below (see PT Problem List). PTA, pt reports independence with all mobility, living with her family without stairs in home. Today, pt reports pain, weakness, fatigue, and requires hand on assistance to walk short distance to [facility]. Will cont to follow and update recs if appropriate.  Pt will benefit from skilled PT to increase their independence and safety with mobility to allow discharge to the venue listed below.       Follow Up Recommendations SNF    Equipment Recommendations  (TBD next venue)    Recommendations for Other Services       Precautions / Restrictions Precautions Precautions: Fall Restrictions Weight Bearing Restrictions: No      Mobility  Bed Mobility               General bed mobility comments: in chair at entry  Transfers Overall transfer level: Needs assistance Equipment used: Rolling walker (2 wheeled) Transfers: Sit to/from Stand Sit to Stand: Min assist         General transfer comment: able to stand and take 1-3 steps to [facility] with RW and hands on guarding.    Ambulation/Gait                Stairs            Wheelchair Mobility    Modified Rankin (Stroke Patients Only)       Balance Overall balance assessment: Needs assistance   Sitting balance-Leahy Scale: Good       Standing balance-Leahy Scale: Poor                               Pertinent Vitals/Pain Pain Assessment: Faces Faces Pain Scale: Hurts little more Pain Location: Chest pain  Pain Descriptors / Indicators: Aching Pain Intervention(s): Limited activity within patient's tolerance    Home Living Family/patient expects to be discharged to:: Private residence Living Arrangements: Children Available Help at Discharge: Family;Available 24 hours/day Type of Home: Apartment Home Access: Level entry     Home Layout: One level Home Equipment: None      Prior Function Level of Independence: Independent         Comments: drives, denies falls, no home O2.      Hand Dominance        Extremity/Trunk Assessment   Upper Extremity Assessment Upper Extremity Assessment: Generalized weakness    Lower Extremity Assessment Lower Extremity Assessment: Generalized weakness       Communication   Communication: No difficulties  Cognition Arousal/Alertness: Awake/alert Behavior During Therapy: WFL for tasks assessed/performed Overall Cognitive Status: Within Functional Limits for tasks assessed  General Comments      Exercises     Assessment/Plan    PT Assessment Patient needs continued PT services  PT Problem List Decreased strength       PT Treatment Interventions DME instruction;Gait training;Stair training;Functional mobility training;Therapeutic activities;Therapeutic exercise    PT Goals (Current goals can be found in the Care Plan section)  Acute Rehab PT Goals Patient Stated Goal: get stronger PT Goal Formulation: With patient Time For Goal Achievement:  07/01/18 Potential to Achieve Goals: Good    Frequency Min 3X/week   Barriers to discharge        Co-evaluation               AM-PAC PT "6 Clicks" Mobility  Outcome Measure Help needed turning from your back to your side while in a flat bed without using bedrails?: A Little Help needed moving from lying on your back to sitting on the side of a flat bed without using bedrails?: A Little Help needed moving to and from a bed to a chair (including a wheelchair)?: A Lot Help needed standing up from a chair using your arms (e.g., wheelchair or bedside chair)?: A Lot Help needed to walk in hospital room?: A Lot Help needed climbing 3-5 steps with a railing? : Total 6 Click Score: 13    End of Session Equipment Utilized During Treatment: Gait belt Activity Tolerance: Patient tolerated treatment well Patient left: in bed Nurse Communication: Mobility status PT Visit Diagnosis: Unsteadiness on feet (R26.81)    Time: 1230-1300 PT Time Calculation (min) (ACUTE ONLY): 30 min   Charges:   PT Evaluation $PT Eval Moderate Complexity: 1 Mod PT Treatments $Therapeutic Activity: 8-22 mins        Reinaldo Berber, PT, DPT Acute Rehabilitation Services Pager: [phone] Office: [phone]    Reinaldo Berber 06/17/2018, 1:33 PM

## 2018-06-17 NOTE — Progress Notes (Signed)
Lower extremity venous has been completed.   Preliminary results in CV Proc.   Abram Sander 06/17/2018 10:17 AM

## 2018-06-17 NOTE — Progress Notes (Signed)
NAME:  Julia Johns, MRN:  [id number], DOB:  10/10/1950, LOS: 8 ADMISSION DATE:  06/09/2018, CONSULTATION DATE:  06/17/2018  REFERRING MD:  Dr. Posey Pronto, CHIEF COMPLAINT:  Fever, abnormal CT    Brief History   This is a 68 year old female who presented as a transfer from [facility] with complaints of shortness of breath and blood-tinged sputum.  Patient underwent CT imaging which revealed a paraspinal soft tissue thickening from T6-T10 as well as a left lower lobe density within the CT which was enlarged from prior imaging concerning for an inflammatory/infectious etiology.  Patient was found to be febrile.  Patient was admitted to [facility] under airborne isolation, COVID negative, pending workup AFB/ quantiferon.  Pulmonary was consulted for recommendations and management of the left lower lobe density.    Since AFB x 2 negative, quantiferon indeterminate, blood cultures negative, sputum pending.  TCTS consulted who did not recommended any surgical evaluation at that point. IR did not feel that it was safe to to try needle aspiration of the paravertebral fluid.  ID has been following and patient has been on meropenum.  Despite broad spectrum abx, she has continued to have an unclear definitive diagnosis.  Despite meropenum, she has persistent ongoing leukocytosis, cough, SOB, persistent blood-tinged sputum (stable Hgb), and severe back pain.  PCCM re-consulted 5/26 given worsening hypoxia and enlarging LLL masslike consolidation.    Past Medical History  Obesity  Significant Hospital Events   06/09/2018: Admission; tx from [place]:  PCCM Cardiothoracic surgery-  Consult 5/19 Interventional radiology ID   Procedures:   Significant Diagnostic Tests:  06/09/2018 CTA chest [facility] health >> - No definite evidence of large central pulmonary embolus is noted, but smaller peripheral pulmonary emboli in the lower branches of the pulmonary arteries cannot be excluded on  the basis of this exam due to respiratory motion artifact.  - continued presence of prevertebral soft tissue density concerning for possible malignancy or infection - 4.5 x 2.9 cm lobulated density noted in the left lower posteriorly which is slightly enlarged compared to prior exam.  This is concerning for possible mass or neoplasm with surrounding atelectasis or infiltrate. - right lower atelectasis or pneumonia is noted with minimal associated pleural effusion - probable thyroid goiter again noted - hepatic steatosis   5/24 CT abd/ pelvis >> 1. Sigmoid diverticulosis. There is mild persistent fat stranding adjacent to the proximal sigmoid colon (series 3, image 60). This may reflect persistent diverticulitis/colitis or alternately chronic sequelae. No evidence of worsening colitis or developing complication such as abscess or perforation. 2. No other CT findings of the abdomen or pelvis to explain abdominal distension, nausea, vomiting, or pain. No evidence of bowel obstruction. 3. There are bilateral pleural effusions with associated atelectasis or consolidation and a large, masslike consolidation of the dependent left lung base measuring at least 4.9 cm, which appears to be enlarged compared to prior CT of the chest dated 06/09/2018 (series 3, image 8). 4. There is paravertebral fluid seen about the partially included anterior lower thoracic spine and diaphragmatic cruces (series 3, image 9). Consider dedicated chest imaging to further evaluate interval change in these findings.  TTE 5/26 >> Normal EF 60-65.  No wall motion abnormalities.  Normal RV function.  No effusion.   5/26 CTA PE >> 1. No demonstrable pulmonary embolus. No thoracic aortic aneurysm or dissection. 2. Fairly small right pleural effusions, slightly larger than on recent studies. Multifocal airspace consolidation, progressed. Suspect consolidation surrounding mass  in left lower lobe posteriorly. 3.  Subcentimeter lymph  nodes without frank adenopathy. 4. Enlarged left lobe of thyroid with dominant thyroid mass measuring 2.8 x 2.7 cm in the left lobe. It may be prudent to correlate with ultrasound given dominant thyroid mass on the left. 5. The paraspinous lesion from T6-T10 noted on recent studies is better seen on recent thoracic MR and chest CT not bolus timed to optimize arterial vascular visualization. Neoplastic etiology in this paraspinous lesion must be of concern as noted recent thoracic MR. 6. Mild reflux of contrast into the inferior vena cava and hepatic veins may indicate a degree of increase in right heart pressure. 7.  Hepatic steatosis.  Micro Data:  5/19 Blood cultures: Negative 5/19 MRSA PCR: Negative 5/20 AFB: negative 5/22 AFB: negative 5/22 expectorated sputum << few candida albicans 5/25 Cdiff >> canceled  5/25 BC x 1 >> ngtd  5/19 QuantiFERON:  indeterminate 5/19 HIV neg 5/25 urine strep and legionella negative  Antimicrobials:  5/20 Meropenem >> 5/27 vanc >>  Interim history/subjective:  Video interpreter used:   Patient reports she feels better today than she has.  Ongoing blood tinged sputum with cough.  Told me that she had a lot of back pain with laying flat yesterday but they gave her medicines for that.  Denies more SOB, rather pain with lying flat.   ID added vancomycin today CTA chest performed yesterday inaccurate I/O's  Stable WBC ~14k, tmax 99.2 Was still on 7 L HFNC-salter but during my exam was able to wean down to 2L .   Objective   Blood pressure 91/66, pulse 95, temperature 99 F (37.2 C), temperature source Oral, resp. rate 20, height 5\' 7"  (1.702 m), weight 93.1 kg, SpO2 93 %.        Intake/Output Summary (Last 24 hours) at 06/17/2018 1152 Last data filed at 06/17/2018 1112 Gross per 24 hour  Intake 230.19 ml  Output 1200 ml  Net -969.81 ml   Filed Weights   06/09/18 1545  Weight: 93.1 kg    Examination: General:  [name]  sitting in bedside recliner in NAD HEENT: MM pink/moist Neuro:  A/O x 3, MAE CV:  Rr, no murmur PULM: even/non-labored, mildly tachpneic, faint bibasilar rales/ diminished, intermittent left post wheeze  GI: obese, soft, non-tender, bs active  Extremities: warm/dry  Skin: no rashes   Resolved Hospital Problem list    Assessment & Plan:   Acute hypoxemic respiratory failure  Left lower lobe rounded density, in the setting of fever cough sputum production, persistent blood tinged sputum, leukocytosis Small bilateral pleural effusions - unclear definitive diagnosis.  - r/o TB- AFBs neg x 2, Quantiferon TB indeterminate  P:  Supplemental O2 for sat goal > 92% CTA PE as above  If remains on O2 <4l will plan for bronchscopy tomorrow with Dr. [name].  NPO after midnight duoneb prn  abx per ID  Right upper lobe pulmonary nodule She will need follow-up for the small subcentimeter right upper lobe pulmonary nodule, follow-up for this will be noncontrasted CT 9 to 12 months.  Remainder per primary.  PCCM will continue to follow  Labs   CBC: Recent Labs  Lab 06/11/18 0649 06/13/18 0944 06/14/18 0528 06/15/18 0540 06/16/18 0436 06/17/18 0423  WBC 11.4* 12.3* 14.0* 13.9* 14.9* 14.0*  NEUTROABS 10.0*  --   --   --   --   --   HGB 11.8* 11.5* 12.0 11.6* 11.3* 10.9*  HCT 36.6 35.8* 38.1 35.9*  35.1* 34.0*  MCV 88.8 87.3 89.2 87.6 87.3 87.6  PLT 425* 466* 501* 507* 554* 514*    Basic Metabolic Panel: Recent Labs  Lab 06/13/18 0944 06/14/18 0528 06/15/18 0540 06/16/18 0436 06/17/18 0423  NA 134* 135 135 135 134*  K 3.4* 4.3 4.1 3.7 3.7  CL 95* 98 95* 92* 89*  CO2 27 27 30  32 33*  GLUCOSE 163* 154* 138* 151* 177*  BUN 12 12 12 13 17   CREATININE 0.59 0.53 0.55 0.53 0.63  CALCIUM 8.4* 8.5* 8.4* 8.4* 8.2*  MG  --   --   --   --  2.2   GFR: Estimated Creatinine Clearance: 78.8 mL/min (by C-G formula based on SCr of 0.63 mg/dL). Recent Labs  Lab 06/14/18 0528 06/15/18  0540 06/16/18 0436 06/17/18 0423  WBC 14.0* 13.9* 14.9* 14.0*    Liver Function Tests: Recent Labs  Lab 06/17/18 0423  AST 44*  ALT 37  ALKPHOS 130*  BILITOT 0.7  PROT 6.3*  ALBUMIN 1.8*   No results for input(s): LIPASE, AMYLASE in the last 168 hours. No results for input(s): AMMONIA in the last 168 hours.  ABG    Component Value Date/Time   PHART 7.451 (H) 06/16/2018 1456   PCO2ART 51.4 (H) 06/16/2018 1456   PO2ART 82.2 (L) 06/16/2018 1456   HCO3 35.3 (H) 06/16/2018 1456   O2SAT 96.1 06/16/2018 1456     Coagulation Profile: No results for input(s): INR, PROTIME in the last 168 hours.  Cardiac Enzymes: No results for input(s): CKTOTAL, CKMB, CKMBINDEX, TROPONINI in the last 168 hours.  HbA1C: No results found for: HGBA1C  CBG: No results for input(s): GLUCAP in the last 168 hours.     Kennieth Rad, MSN, AGACNP-BC [facility] Pgr: [phone] or if no answer [phone] 06/17/2018, 11:52 AM

## 2018-06-17 NOTE — Progress Notes (Signed)
[facility] for Infectious Disease  Date of Admission:  06/09/2018   Total days of antibiotics 8        Day 8 Meropenum         ASSESSMENT: Afebrile overnight. The patients respiratory status continues to decline slowly despite broad spectrum antibiotic coverage with meropenum for CAP. Per chart review she is on 7L HFNC with declining Oxygen saturations and increasing dyspnea. She is intermittently tachycardic with soft BP.  Unfortunately, strict I/O are only now being recorded supporting the supposition that her fluid count is not accurate. She has been regularly micturating in large volumes per report and this MD's observation daily. I have ordered daily weights again to better guide volume therapy and personally recorded her weight today at 93.8 kgs, representing no significant increase from admission. ABG with metabolic alkalosis most likely the result of her loop diuretic as no other clear alternative observed. Stable normal SCr again today at 0.63. Echo with preserved LVEF of 60-65% and no evidence of diastolic dysfunction. No valvular abnormalities noted on echo. IVC noted to be normal in size and with >50% variability contributing to the low likelihood of volume overload. Bilateral peripheral edema is present and chronic as per patient with no notable change since admission that she can recall.   The progression of her LL lung mass, paravertebral fluid collection and increasing supplemental oxygen requirements is concerning for a progressing infectious process vs neoplasm with obstruction. The latter of which seems less likely given the rate of progression. This seems unlikely to be a MRSA pneumonia but this has been considered daily. Fungal processes seem unlikely based on the imaging findings, labs, history and physical exam. TB ruled out.  We will broaden antibiotic therapy to include MRSA coverage as she continues to worsen on broad gram negative, anaerobic and strep coverage.  Legionella and strep urinary antigens are negative. HIV neg. COVID neg. Will order repeat sputum smear/culture. Patient will need tissue to diagnosis and definitive treatment.   Consulted TCTS's Dr. Cyndia Bent who feels that there is no current indication to pursue a surgical intervention at this time. Pulmonology has deferred bronchoscopy given predicted low likelihood of optimal sample collection.    PLAN: 1. Continue Meropenum 2. Start vancomycin for empiric MRSA coverage 3. Repeat sputum culture/smear  Principal Problem:   Pneumonia Active Problems:   Mass of lower lobe of left lung   Colitis   Paravertebral mass  Scheduled Meds: . benzonatate  100 mg Oral TID  . chlorpheniramine-HYDROcodone  5 mL Oral Q12H  . furosemide  80 mg Intravenous BID  . guaiFENesin  600 mg Oral BID  . methocarbamol  500 mg Oral TID  . sodium chloride flush  3 mL Intravenous Q12H   Continuous Infusions: . meropenem (MERREM) IV 1 g (06/17/18 0518)   PRN Meds:.acetaminophen **OR** acetaminophen, ipratropium-albuterol, menthol-cetylpyridinium, morphine injection, ondansetron **OR** ondansetron (ZOFRAN) IV, oxyCODONE, phenol, simethicone, zolpidem   SUBJECTIVE: The patient stated that she moved to [place] from [place] in September 2019.  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  She denies traveling to desert areas, denied contact with individual's with similar illnesses or ever having prior similar symptoms.  She moved to [place] to be with her son in September and decided to stay as she liked the weather.  Review of Systems: Review of Systems  Constitutional: Positive for malaise/fatigue. Negative for chills, diaphoresis  and fever.  Respiratory: Positive for cough, hemoptysis, sputum production and shortness of breath.   Cardiovascular: Positive for chest pain (With coughing, lying flat and sitting in the wrong positions) and leg swelling.   Genitourinary: Negative for flank pain and urgency.  Musculoskeletal: Negative for myalgias.  Neurological: Positive for headaches (With coughing).    Allergies  Allergen Reactions  . Penicillins Anaphylaxis    Patient stated that within a few minutes of receiving a penicillin IM injection she rapidly became unconscious and required "additional" medication to recover. This occurred when she was ~68 years old and she does not recall what was given to assist in her recovery. She did not develop a rash.     OBJECTIVE: Vitals:   06/16/18 2045 06/16/18 2300 06/17/18 0300 06/17/18 0743  BP:  102/69 99/67   Pulse:  98 (!) 109 (!) 122  Resp:  (!) 21 18 (!) 22  Temp:  98 F (36.7 C) 98.2 F (36.8 C)   TempSrc:  Oral Oral   SpO2: 96% 96% 94% 93%  Weight:      Height:       Body mass index is 32.15 kg/m.  Physical Exam General: A/O x4, in no acute distress, afebrile, nondiaphoretic HEENT: Oropharynx clear, no notable plaques, erythema or bleeding ulcers orally Cardio: RRR, no mrg's  Pulmonary: Mild crackles in the lower lung fields bilaterally Abdomen: Bowel sounds normal, abdomen is mildly distended MSK: BLE nontender, nonedematous Neuro: Alert, conversational,  Psych: Appropriate affect, not depressed in appearance, engages well  Lab Results Lab Results  Component Value Date   WBC 14.0 (H) 06/17/2018   HGB 10.9 (L) 06/17/2018   HCT 34.0 (L) 06/17/2018   MCV 87.6 06/17/2018   PLT 514 (H) 06/17/2018    Lab Results  Component Value Date   CREATININE 0.63 06/17/2018   BUN 17 06/17/2018   NA 134 (L) 06/17/2018   K 3.7 06/17/2018   CL 89 (L) 06/17/2018   CO2 33 (H) 06/17/2018    Lab Results  Component Value Date   ALT 37 06/17/2018   AST 44 (H) 06/17/2018   ALKPHOS 130 (H) 06/17/2018   BILITOT 0.7 06/17/2018     Microbiology: Recent Results (from the past 240 hour(s))  MRSA PCR Screening     Status: None   Collection Time: 06/09/18  3:45 PM  Result Value Ref  Range Status   MRSA by PCR NEGATIVE NEGATIVE Final    Comment:        The GeneXpert MRSA Assay (FDA approved for NASAL specimens only), is one component of a comprehensive MRSA colonization surveillance program. It is not intended to diagnose MRSA infection nor to guide or monitor treatment for MRSA infections. Performed at [facility], [place] [address], [place], [place] [address]   Culture, blood (routine x 2)     Status: None   Collection Time: 06/09/18  5:00 PM  Result Value Ref Range Status   Specimen Description BLOOD LEFT ANTECUBITAL  Final   Special Requests   Final    BOTTLES DRAWN AEROBIC ONLY Blood Culture adequate volume   Culture   Final    NO GROWTH 5 DAYS Performed at [facility], [place] [address], [place],  [address]    Report Status 06/14/2018 FINAL  Final  Culture, blood (routine x 2)     Status: None   Collection Time: 06/09/18  5:05 PM  Result Value Ref Range Status   Specimen Description BLOOD  LEFT ANTECUBITAL  Final   Special Requests   Final    BOTTLES DRAWN AEROBIC ONLY Blood Culture adequate volume   Culture   Final    NO GROWTH 5 DAYS Performed at [facility], [address] [place], [place] [address]    Report Status 06/14/2018 FINAL  Final  Acid Fast Smear (AFB)     Status: None   Collection Time: 06/10/18 10:38 AM  Result Value Ref Range Status   AFB Specimen Processing Concentration  Final   Acid Fast Smear Negative  Final    Comment: (NOTE) Performed At: [facility] [place], [place] [id number] Rush Farmer MD QQ:[id number]    Source (AFB) SPUTUM  Final    Comment: Performed at [facility], [place] [address], [place], [place] [address]  Expectorated sputum assessment w rflx to resp cult     Status: None   Collection Time: 06/12/18  6:44 PM  Result Value Ref Range Status   Specimen Description EXPECTORATED SPUTUM  Final   Special Requests NONE  Final   Sputum evaluation   Final     THIS SPECIMEN IS ACCEPTABLE FOR SPUTUM CULTURE Performed at [facility], [place] [address] [place], [place] [address]    Report Status 06/12/2018 FINAL  Final  Acid Fast Smear (AFB)     Status: None   Collection Time: 06/12/18  6:44 PM  Result Value Ref Range Status   AFB Specimen Processing Concentration  Final   Acid Fast Smear Negative  Final    Comment: (NOTE) Performed At: [facility] [place], [place] [id number] Rush Farmer MD FU:[id number]    Source (AFB) SPUTUM  Final    Comment: Performed at [facility], [place] [address], [place], [place] [address]  Culture, respiratory     Status: None   Collection Time: 06/12/18  6:44 PM  Result Value Ref Range Status   Specimen Description EXPECTORATED SPUTUM  Final   Special Requests NONE Reflexed from [id number]  Final   Gram Stain   Final    ABUNDANT WBC PRESENT, PREDOMINANTLY PMN FEW GRAM POSITIVE RODS FEW YEAST Performed at [facility], [place] [address] [place], [place] [address]    Culture FEW CANDIDA ALBICANS  Final   Report Status 06/14/2018 FINAL  Final  Culture, blood (single)     Status: None (Preliminary result)   Collection Time: 06/15/18  5:40 AM  Result Value Ref Range Status   Specimen Description BLOOD RIGHT HAND  Final   Special Requests   Final    BOTTLES DRAWN AEROBIC ONLY Blood Culture adequate volume   Culture   Final    NO GROWTH 1 DAY Performed at [facility], [place] [address], [place], [place] [address]    Report Status PENDING  Incomplete    Kathi Ludwig, [facility] [phone] pager   [phone] cell 06/17/2018, 8:07 AM

## 2018-06-17 NOTE — Progress Notes (Signed)
Pharmacy Antibiotic Note  Julia Johns is a 68 y.o. female admitted on 06/09/2018 with pneumonia.  Pharmacy has been consulted for vancomycin dosing. Patient is currently on day 8 of antibiotic treatment with meropenem. CT chest from 5/26 shows LLL mass that has increased in size despite antibiotic treatment. Patient's blood cultures have remained negative. Today patient is afebrile, but continues to have leukocytosis (WBC 14) and is requiring 7L HFNC. Patient's Scr stable.  Plan: Vancomycin 2000 mg IV x 1, followed by vancomycin 1250 mg IV q12h (expected AUC 494, Cmax ss 39.9, Cmin ss 11.8 using SCr of 0.63) Obtain vancomycin peak and trough once at steady state for further dose adjustments Monitor Scr, CBC, and clinical progression F/u plans for LOT  Height: 5\' 7"  (170.2 cm) Weight: (93.8) IBW/kg (Calculated) : 61.6  Temp (24hrs), Avg:98.3 F (36.8 C), Min:98 F (36.7 C), Max:99 F (37.2 C)  Recent Labs  Lab 06/13/18 0944 06/14/18 0528 06/15/18 0540 06/16/18 0436 06/17/18 0423  WBC 12.3* 14.0* 13.9* 14.9* 14.0*  CREATININE 0.59 0.53 0.55 0.53 0.63    Estimated Creatinine Clearance: 78.8 mL/min (by C-G formula based on SCr of 0.63 mg/dL).    Allergies  Allergen Reactions  . Penicillins Anaphylaxis    Patient stated that within a few minutes of receiving a penicillin IM injection she rapidly became unconscious and required "additional" medication to recover. This occurred when she was ~68 years old and she does not recall what was given to assist in her recovery. She did not develop a rash.     Antimicrobials this admission: Meropenem 5/20 >>  Vancomycin 5/27 >>   Dose adjustments this admission: N/A  Microbiology results: 5/25 BCx: NG x 2 days 5/19 BCx: NG - final 5/22 AFB: negative 5/20 AFB: negative 5/22 Sputum: few candida albicans  5/19 MRSA PCR: negative  5/25 Strep pneumo urinary antigen: negative 5/25 L. Pneumophila urinary antigen:  negative  Thank you for allowing pharmacy to be a part of this patient's care.  Leron Croak, PharmD PGY1 Pharmacy Resident Phone: [phone]  Please check AMION for all [place] phone numbers 06/17/2018 10:19 AM

## 2018-06-17 NOTE — Progress Notes (Signed)
[facility] Progress Note  Patient: Julia Johns WHQ:[id number]   PCP: Patient, No Pcp Per DOB: [birth year]   DOA: 06/09/2018   DOS: 06/17/2018   Date of Service: the patient was seen and examined on 06/17/2018  Brief hospital course: Pt. with no significant PMH; admitted on 06/09/2018, presented with complaint of cough and fever, was found to have CAP. Currently further plan is continue IV Antibiotics.  Subjective:   Continue to have congested cough with minimal sputum production, she is on three liter o2, does not appear to be in respiratory distress at rest, she is sitting up in chair  She has bilateral lower extremity edema No fever No nausea no vomiting.  Reports feeling sleepy   Assessment and Plan: Acute hypoxemic respiratory failure Left lower lobe rounded density, in the setting of fever cough sputum production, scant hemoptysis, leukocytosis community-acquired pneumonia Acute diastolic heart failure Paraspinal fluid collection Neurosurgery recommends no intervention for now.  Neurology feels that the patient does not require any intervention since it is not affecting spine. Sputum cultures as well as AFB smear, per infectious disease CT surgery recommends no intervention for now. PCCM consulted, recommended defer bronchoscopy and plan for repeat imaging in 4 to 6 weeks to ensure resolution of left lower lobe density. If there is no resolution of the density on imaging after treatment she will need bronchoscopy. QuantiFERON-TB gold is indeterminant. AFB smear negative x2. per ID patient can come off of isolation. ESR 124, CRP 38.1 ANCA titers, GBM titers currently pending. HIV antibody negative. MRSA PCR negative COVID x2 negative in the other facility Provide Tussionex and other cough suppressant. PCCM recommends no indication for bronc for now. Recommend further work-up for vasculitis which is currently ordered. ID currently following. We will continue  with IV diuresis aggressively. Patient's in and out are not accurate at all.  Recommend purvick for now.  Bronchoscopy planned for tomorrow, will follow pulm/ID recommendations  Bilateral proximal muscle weakness both upper and lower extremity. Patient reports this is ongoing for last 7 days. ESR is elevated. May represent PMR or other inflammatory condition. May also represent association with sepsis. Monitor for now.   Right upper lobe pulmonary nodule She will need follow-up for the small subcentimeter right upper lobe pulmonary nodule, follow-up for this will be noncontrasted CT 9 to 12 months.  Bilateral lower extremity edema Echo with preserved EF, venous doppler negative for DVT She is on iv lasix Close monitor intake and output  Abdominal pain. Colitis. Nausea and vomiting. Due to her worsening severe abdominal pain as well as nausea and vomiting a CT abdomen pelvis with contrast was performed. Evidence of persistent colitis without any worsening. No other acute abnormality. C. difficile was ordered but not sent as the patient did not have any further diarrhea.  Constipation. Provide stool softener.Hold for now.  Obesity: Body mass index is 32.15 kg/m.   Diet: Cardiac diet DVT Prophylaxis: subcutaneous Heparin  Advance goals of care discussion: Full code  Family Communication: no family was present at bedside, at the time of interview.  Discussed with daughter-in-law on 06/16/2018.  Disposition:  Discharge to be determined . Need to be cleared by pulm and ID  Consultants: Neurology, neurosurgery, pulmonary, cardiothoracic surgery, infectious disease Procedures: none  Scheduled Meds: . benzonatate  100 mg Oral TID  . chlorpheniramine-HYDROcodone  5 mL Oral Q12H  . furosemide  80 mg Intravenous BID  . guaiFENesin  600 mg Oral BID  . methocarbamol  500 mg Oral  TID  . sodium chloride flush  3 mL Intravenous Q12H   Continuous Infusions: . meropenem  (MERREM) IV 1 g (06/17/18 1418)  . vancomycin     PRN Meds: acetaminophen **OR** acetaminophen, ipratropium-albuterol, lip balm, menthol-cetylpyridinium, morphine injection, ondansetron **OR** ondansetron (ZOFRAN) IV, oxyCODONE, phenol, simethicone, zolpidem Antibiotics: Anti-infectives (From admission, onward)   Start     Dose/Rate Route Frequency Ordered Stop   06/17/18 2300  vancomycin (VANCOCIN) 1,250 mg in sodium chloride 0.9 % 250 mL IVPB     1,250 mg 166.7 mL/hr over 90 Minutes Intravenous Every 12 hours 06/17/18 1027     06/17/18 1100  vancomycin (VANCOCIN) 2,000 mg in sodium chloride 0.9 % 500 mL IVPB     2,000 mg 250 mL/hr over 120 Minutes Intravenous  Once 06/17/18 1027 06/17/18 1618   06/10/18 1400  meropenem (MERREM) 1 g in sodium chloride 0.9 % 100 mL IVPB     1 g 200 mL/hr over 30 Minutes Intravenous Every 8 hours 06/10/18 1120         Objective: Physical Exam: Vitals:   06/17/18 1300 06/17/18 1448 06/17/18 1500 06/17/18 1719  BP:    111/68  Pulse: 98 85 87 (!) 103  Resp: (!) 23  19 18   Temp:      TempSrc:      SpO2: 96%  97% 91%  Weight:      Height:        Intake/Output Summary (Last 24 hours) at 06/17/2018 1832 Last data filed at 06/17/2018 1418 Gross per 24 hour  Intake 600 ml  Output 1000 ml  Net -400 ml   Filed Weights   06/09/18 1545  Weight: 93.1 kg   General: Alert, Awake and Oriented to Time, Place and Person. Appear in mild distress, affect appropriate Eyes: PERRL, Conjunctiva normal ENT: Oral Mucosa clear moist. Neck: no JVD, no Abnormal Mass Or lumps Cardiovascular: S1 and S2 Present, no Murmur, Peripheral Pulses Present Respiratory: normal respiratory effort, Bilateral Air entry equal and Decreased, no use of accessory muscle, Clear to Auscultation, no Crackles, no wheezes Abdomen: Bowel Sound present, Soft and no tenderness, no hernia Skin: no redness, no Rash, no induration Extremities: bilateral lower extremity pitting  edema, no calf  tenderness Neurologic: Grossly no focal neuro deficit. Bilaterally Equal motor strength  Data Reviewed: CBC: Recent Labs  Lab 06/11/18 0649 06/13/18 0944 06/14/18 0528 06/15/18 0540 06/16/18 0436 06/17/18 0423  WBC 11.4* 12.3* 14.0* 13.9* 14.9* 14.0*  NEUTROABS 10.0*  --   --   --   --   --   HGB 11.8* 11.5* 12.0 11.6* 11.3* 10.9*  HCT 36.6 35.8* 38.1 35.9* 35.1* 34.0*  MCV 88.8 87.3 89.2 87.6 87.3 87.6  PLT 425* 466* 501* 507* 554* 628*   Basic Metabolic Panel: Recent Labs  Lab 06/13/18 0944 06/14/18 0528 06/15/18 0540 06/16/18 0436 06/17/18 0423  NA 134* 135 135 135 134*  K 3.4* 4.3 4.1 3.7 3.7  CL 95* 98 95* 92* 89*  CO2 27 27 30  32 33*  GLUCOSE 163* 154* 138* 151* 177*  BUN 12 12 12 13 17   CREATININE 0.59 0.53 0.55 0.53 0.63  CALCIUM 8.4* 8.5* 8.4* 8.4* 8.2*  MG  --   --   --   --  2.2    Liver Function Tests: Recent Labs  Lab 06/17/18 0423  AST 44*  ALT 37  ALKPHOS 130*  BILITOT 0.7  PROT 6.3*  ALBUMIN 1.8*   No results for input(s): LIPASE,  AMYLASE in the last 168 hours. No results for input(s): AMMONIA in the last 168 hours. Coagulation Profile: No results for input(s): INR, PROTIME in the last 168 hours. Cardiac Enzymes: No results for input(s): CKTOTAL, CKMB, CKMBINDEX, TROPONINI in the last 168 hours. BNP (last 3 results) No results for input(s): PROBNP in the last 8760 hours. CBG: No results for input(s): GLUCAP in the last 168 hours. Studies: Vas [place] Lower Extremity Venous (dvt)  Result Date: 06/17/2018  Lower Venous Study Indications: Edema.  Performing Technologist: Abram Sander RVS  Examination Guidelines: A complete evaluation includes B-mode imaging, spectral Doppler, color Doppler, and power Doppler as needed of all accessible portions of each vessel. Bilateral testing is considered an integral part of a complete examination. Limited examinations for reoccurring indications may be performed as noted.   +---------+---------------+---------+-----------+----------+--------------+ RIGHT    CompressibilityPhasicitySpontaneityPropertiesSummary        +---------+---------------+---------+-----------+----------+--------------+ CFV      Full           Yes      Yes                                 +---------+---------------+---------+-----------+----------+--------------+ SFJ      Full                                                        +---------+---------------+---------+-----------+----------+--------------+ FV Prox  Full                                                        +---------+---------------+---------+-----------+----------+--------------+ FV Mid   Full                                                        +---------+---------------+---------+-----------+----------+--------------+ FV DistalFull                                                        +---------+---------------+---------+-----------+----------+--------------+ PFV      Full                                                        +---------+---------------+---------+-----------+----------+--------------+ POP      Full           Yes      Yes                                 +---------+---------------+---------+-----------+----------+--------------+ PTV      Full                                                        +---------+---------------+---------+-----------+----------+--------------+  PERO                                                  Not visualized +---------+---------------+---------+-----------+----------+--------------+   +---------+---------------+---------+-----------+----------+--------------+ LEFT     CompressibilityPhasicitySpontaneityPropertiesSummary        +---------+---------------+---------+-----------+----------+--------------+ CFV      Full           Yes      Yes                                  +---------+---------------+---------+-----------+----------+--------------+ SFJ      Full                                                        +---------+---------------+---------+-----------+----------+--------------+ FV Prox  Full                                                        +---------+---------------+---------+-----------+----------+--------------+ FV Mid   Full                                                        +---------+---------------+---------+-----------+----------+--------------+ FV DistalFull                                                        +---------+---------------+---------+-----------+----------+--------------+ PFV      Full                                                        +---------+---------------+---------+-----------+----------+--------------+ POP      Full           Yes      Yes                                 +---------+---------------+---------+-----------+----------+--------------+ PTV      Full                                                        +---------+---------------+---------+-----------+----------+--------------+ PERO  Not visualized +---------+---------------+---------+-----------+----------+--------------+     Summary: Right: There is no evidence of deep vein thrombosis in the lower extremity. No cystic structure found in the popliteal fossa. Left: There is no evidence of deep vein thrombosis in the lower extremity. No cystic structure found in the popliteal fossa.  *See table(s) above for measurements and observations. Electronically signed by Harold Barban MD on 06/17/2018 at 1:08:38 PM.    Final      Time spent: 35 minutes  Author:  Florencia Reasons, MD PhD [facility] 06/17/2018 6:32 PM  To reach On-call, see care teams to locate the attending and reach out to them via [email or web address]. If 7PM-7AM, please contact night-coverage If you still  have difficulty reaching the attending provider, please page the [facility] (Director on Call) for [facility] on amion for assistance.

## 2018-06-17 NOTE — Progress Notes (Signed)
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 Pt reports no pain only discomfort from sitting in chair.  Asked RN for massage, pt gave brief massage.  When asked pt if working hard to breathe or feeling sob- pt stated she felt like she was not getting as much oxygen & requesting more (pt O2 sat 95%). RN increased O2 from 2.5 to 3L [place].  Patient stated that felt better.

## 2018-06-17 NOTE — Progress Notes (Deleted)
93.8 

## 2018-06-18 ENCOUNTER — Inpatient Hospital Stay (HOSPITAL_COMMUNITY): Payer: Medicare HMO

## 2018-06-18 ENCOUNTER — Encounter (HOSPITAL_COMMUNITY)
Admission: AD | Disposition: A | Discharge status: Home Health | Payer: Self-pay | Source: Other Acute Inpatient Hospital | Attending: Internal Medicine

## 2018-06-18 ENCOUNTER — Encounter (HOSPITAL_COMMUNITY): Payer: Self-pay | Admitting: Respiratory Therapy

## 2018-06-18 DIAGNOSIS — G959 Disease of spinal cord, unspecified: Secondary | ICD-10-CM

## 2018-06-18 DIAGNOSIS — E079 Disorder of thyroid, unspecified: Secondary | ICD-10-CM

## 2018-06-18 HISTORY — PX: VIDEO BRONCHOSCOPY: SHX5072

## 2018-06-18 LAB — ANCA TITERS
Atypical P-ANCA titer: <1:20 {titer}
C-ANCA: 1:80 {titer} — ABNORMAL HIGH
P-ANCA: <1:20 {titer}

## 2018-06-18 LAB — ACID FAST SMEAR (AFB, MYCOBACTERIA): Acid Fast Smear: NEGATIVE

## 2018-06-18 SURGERY — BRONCHOSCOPY, WITH FLUOROSCOPY
Anesthesia: Moderate Sedation | Laterality: Bilateral

## 2018-06-18 MED ORDER — FENTANYL CITRATE (PF) 100 MCG/2ML IJ SOLN
INTRAMUSCULAR | Status: AC
  Filled 2018-06-18: qty 4

## 2018-06-18 MED ORDER — LIDOCAINE HCL 2 % EX GEL
1.0000 "application " | Freq: Once | CUTANEOUS | Status: DC
  Filled 2018-06-18: qty 4250

## 2018-06-18 MED ORDER — LIDOCAINE HCL URETHRAL/MUCOSAL 2 % EX GEL
CUTANEOUS | Status: DC | PRN
  Administered 2018-06-18: 1

## 2018-06-18 MED ORDER — MIDAZOLAM HCL (PF) 10 MG/2ML IJ SOLN
INTRAMUSCULAR | Status: DC | PRN
  Administered 2018-06-18 (×3): 1 mg via INTRAVENOUS

## 2018-06-18 MED ORDER — PHENYLEPHRINE HCL 0.25 % NA SOLN
1.0000 | Freq: Four times a day (QID) | NASAL | Status: DC | PRN
  Filled 2018-06-18: qty 15

## 2018-06-18 MED ORDER — MIDAZOLAM HCL 2 MG/2ML IJ SOLN
2.0000 mg | Freq: Once | INTRAMUSCULAR | Status: DC
  Filled 2018-06-18: qty 2

## 2018-06-18 MED ORDER — PHENYLEPHRINE HCL 0.25 % NA SOLN
NASAL | Status: DC | PRN
  Administered 2018-06-18: 2 via NASAL

## 2018-06-18 MED ORDER — SODIUM CHLORIDE 0.9 % IV SOLN
INTRAVENOUS | Status: DC
  Administered 2018-06-18: 19:00:00 via INTRAVENOUS

## 2018-06-18 MED ORDER — MIDAZOLAM HCL (PF) 5 MG/ML IJ SOLN
INTRAMUSCULAR | Status: AC
  Filled 2018-06-18: qty 2

## 2018-06-18 MED ORDER — FENTANYL CITRATE (PF) 100 MCG/2ML IJ SOLN
150.0000 ug | Freq: Once | INTRAMUSCULAR | Status: AC
  Administered 2018-06-18: 100 ug via INTRAVENOUS

## 2018-06-18 MED ORDER — LIDOCAINE HCL 1 % IJ SOLN
INTRAMUSCULAR | Status: DC | PRN
  Administered 2018-06-18: 6 mL via RESPIRATORY_TRACT

## 2018-06-18 NOTE — Care Management Important Message (Signed)
Important Message  Patient Details  Name: Julia Johns MRN: [id number] Date of Birth: [birth year]   Medicare Important Message Given:  Yes    [name] 06/18/2018, 2:18 PM

## 2018-06-18 NOTE — Progress Notes (Deleted)
Plan for bronchoscopy 2 pm tomorrow w/ Dr [name].  Will Place NPO order.   Erick Colace ACNP-BC V[facility] Pager # [phone] OR # [phone] if no answer

## 2018-06-18 NOTE — Progress Notes (Signed)
Video Bronchoscopy done Interventional bronchial washing Interventional bronciail biopsy done Interventional bronchial brushings done

## 2018-06-18 NOTE — Progress Notes (Signed)
NAME:  Julia Johns, MRN:  [id number], DOB:  07-Jan-1951, LOS: 9 ADMISSION DATE:  06/09/2018, CONSULTATION DATE:  06/18/2018  REFERRING MD:  Dr. Posey Pronto, CHIEF COMPLAINT:  Fever, abnormal CT    Brief History   This is a 68 year old female who presented as a transfer from [facility] with complaints of shortness of breath and blood-tinged sputum.  Patient underwent CT imaging which revealed a paraspinal soft tissue thickening from T6-T10 as well as a left lower lobe density within the CT which was enlarged from prior imaging concerning for an inflammatory/infectious etiology.  Patient was found to be febrile.  Patient was admitted to [facility] under airborne isolation, COVID negative, pending workup AFB/ quantiferon.  Pulmonary was consulted for recommendations and management of the left lower lobe density.    Since AFB x 2 negative, quantiferon indeterminate, blood cultures negative, sputum pending.  TCTS consulted who did not recommended any surgical evaluation at that point. IR did not feel that it was safe to to try needle aspiration of the paravertebral fluid.  ID has been following and patient has been on meropenum.  Despite broad spectrum abx, she has continued to have an unclear definitive diagnosis.  Despite meropenum, she has persistent ongoing leukocytosis, cough, SOB, persistent blood-tinged sputum (stable Hgb), and severe back pain.  PCCM re-consulted 5/26 given worsening hypoxia and enlarging LLL masslike consolidation.    Past Medical History  Obesity  Significant Hospital Events   06/09/2018: Admission; tx from [facility]:  PCCM Cardiothoracic surgery-  Consult 5/19 Interventional radiology ID   Procedures:   Significant Diagnostic Tests:  06/09/2018 CTA chest [facility] health >> - No definite evidence of large central pulmonary embolus is noted, but smaller peripheral pulmonary emboli in the lower branches of the pulmonary arteries cannot be excluded on  the basis of this exam due to respiratory motion artifact.  - continued presence of prevertebral soft tissue density concerning for possible malignancy or infection - 4.5 x 2.9 cm lobulated density noted in the left lower posteriorly which is slightly enlarged compared to prior exam.  This is concerning for possible mass or neoplasm with surrounding atelectasis or infiltrate. - right lower atelectasis or pneumonia is noted with minimal associated pleural effusion - probable thyroid goiter again noted - hepatic steatosis   5/24 CT abd/ pelvis >> 1. Sigmoid diverticulosis. There is mild persistent fat stranding adjacent to the proximal sigmoid colon (series 3, image 60). This may reflect persistent diverticulitis/colitis or alternately chronic sequelae. No evidence of worsening colitis or developing complication such as abscess or perforation. 2. No other CT findings of the abdomen or pelvis to explain abdominal distension, nausea, vomiting, or pain. No evidence of bowel obstruction. 3. There are bilateral pleural effusions with associated atelectasis or consolidation and a large, masslike consolidation of the dependent left lung base measuring at least 4.9 cm, which appears to be enlarged compared to prior CT of the chest dated 06/09/2018 (series 3, image 8). 4. There is paravertebral fluid seen about the partially included anterior lower thoracic spine and diaphragmatic cruces (series 3, image 9). Consider dedicated chest imaging to further evaluate interval change in these findings.  TTE 5/26 >> Normal EF 60-65.  No wall motion abnormalities.  Normal RV function.  No effusion.   5/26 CTA PE >> 1. No demonstrable pulmonary embolus. No thoracic aortic aneurysm or dissection. 2. Fairly small right pleural effusions, slightly larger than on recent studies. Multifocal airspace consolidation, progressed. Suspect consolidation surrounding mass  in left lower lobe posteriorly. 3.  Subcentimeter lymph  nodes without frank adenopathy. 4. Enlarged left lobe of thyroid with dominant thyroid mass measuring 2.8 x 2.7 cm in the left lobe. It may be prudent to correlate with ultrasound given dominant thyroid mass on the left. 5. The paraspinous lesion from T6-T10 noted on recent studies is better seen on recent thoracic MR and chest CT not bolus timed to optimize arterial vascular visualization. Neoplastic etiology in this paraspinous lesion must be of concern as noted recent thoracic MR. 6. Mild reflux of contrast into the inferior vena cava and hepatic veins may indicate a degree of increase in right heart pressure. 7.  Hepatic steatosis.  Micro Data:  5/19 Blood cultures: Negative 5/19 MRSA PCR: Negative 5/20 AFB: negative 5/22 AFB: negative 5/22 expectorated sputum << few candida albicans 5/25 Cdiff >> canceled  5/25 BC x 1 >> ngtd  5/19 QuantiFERON:  indeterminate 5/19 HIV neg 5/25 urine strep and legionella negative  Antimicrobials:  5/20 Meropenem >> 5/27 vanc >>  Interim history/subjective:   Still coughing Afebrile On 3 L nasal cannula 400 cc urine output documented   Objective   Blood pressure (!) 84/45, pulse 93, temperature 98.2 F (36.8 C), temperature source Oral, resp. rate 19, height 5\' 7"  (1.702 m), weight 94.7 kg, SpO2 92 %.        Intake/Output Summary (Last 24 hours) at 06/18/2018 1114 Last data filed at 06/18/2018 0600 Gross per 24 hour  Intake 1050 ml  Output -  Net 1050 ml   Filed Weights   06/09/18 1545 06/18/18 0453  Weight: 93.1 kg 94.7 kg    Examination: General:  [name] sitting in bedside recliner in NAD HEENT: MM pink/moist Neuro:  A/O x 3, MAE CV:  Rr, no murmur PULM: even/non-labored, decreased breath sounds bilateral GI: obese, soft, non-tender, bs active  Extremities: 1+ edema Skin: no rashes   Resolved Hospital Problem list    Assessment & Plan:   Acute hypoxemic respiratory failure  -improving Left lower lobe  rounded density, in the setting of fever cough sputum production, persistent blood tinged sputum, leukocytosis -started off as a 1.7 cm nodule on CT abdomen 5/13 and has increased to current size of 4 cm-more likely to be infectious or inflammatory although no cavitation noted.   Small bilateral pleural effusions - unclear definitive diagnosis.  - r/o TB- AFBs neg x 2, Quantiferon TB indeterminate  P:  Bronchoscopy today, discussed risks and benefits with the patient   Right upper lobe pulmonary nodule She will need follow-up for the small subcentimeter right upper lobe pulmonary nodule, follow-up for this will be noncontrasted CT 9 to 12 months.  T6-T10 prevertebral collection -clarified on MRI from Lenice Pressman MD. Shade Flood. [facility] Pager [phone] If no response call [phone]     06/18/2018, 11:14 AM

## 2018-06-18 NOTE — Progress Notes (Signed)
Pt arrived back to unit. No distress noted.

## 2018-06-18 NOTE — Progress Notes (Addendum)
INFECTIOUS DISEASE PROGRESS NOTE  ID: Julia Johns is a 68 y.o. female with  Principal Problem:   Pneumonia Active Problems:   Paravertebral mass   Mass of lower lobe of left lung   Colitis   Acute respiratory failure with hypoxia (HCC)   Bilateral lower extremity edema   Obesity (BMI 30.0-34.9)  Subjective: Continued cough, denies CP. occas hemoptysis. Denies fever She has not been hypoxic  Abtx:  Anti-infectives (From admission, onward)   Start     Dose/Rate Route Frequency Ordered Stop   06/17/18 2300  vancomycin (VANCOCIN) 1,250 mg in sodium chloride 0.9 % 250 mL IVPB     1,250 mg 166.7 mL/hr over 90 Minutes Intravenous Every 12 hours 06/17/18 1027     06/17/18 1100  vancomycin (VANCOCIN) 2,000 mg in sodium chloride 0.9 % 500 mL IVPB     2,000 mg 250 mL/hr over 120 Minutes Intravenous  Once 06/17/18 1027 06/17/18 2320   06/10/18 1400  meropenem (MERREM) 1 g in sodium chloride 0.9 % 100 mL IVPB     1 g 200 mL/hr over 30 Minutes Intravenous Every 8 hours 06/10/18 1120        Medications:  Scheduled:  benzonatate  100 mg Oral TID   chlorpheniramine-HYDROcodone  5 mL Oral Q12H   furosemide  80 mg Intravenous BID   guaiFENesin  600 mg Oral BID   methocarbamol  500 mg Oral TID   sodium chloride flush  3 mL Intravenous Q12H    Objective: Vital signs in last 24 hours: Temp:  [98.9 F (37.2 C)-100.3 F (37.9 C)] 98.9 F (37.2 C) (05/28 0822) Pulse Rate:  [85-108] 92 (05/28 0822) Resp:  [18-24] 23 (05/28 0822) BP: (91-117)/(50-68) 99/54 (05/28 0822) SpO2:  [91 %-100 %] 94 % (05/28 0822) Weight:  [94.7 kg] 94.7 kg (05/28 0453)   General appearance: alert, cooperative, fatigued and no distress Resp: diminished breath sounds bilaterally Cardio: regular rate and rhythm GI: normal findings: bowel sounds normal and soft, non-tender  Lab Results Recent Labs    06/16/18 0436 06/17/18 0423  WBC 14.9* 14.0*  HGB 11.3* 10.9*  HCT 35.1* 34.0*    NA 135 134*  K 3.7 3.7  CL 92* 89*  CO2 32 33*  BUN 13 17  CREATININE 0.53 0.63   Liver Panel Recent Labs    06/17/18 0423  PROT 6.3*  ALBUMIN 1.8*  AST 44*  ALT 37  ALKPHOS 130*  BILITOT 0.7  BILIDIR 0.1  IBILI 0.6   Sedimentation Rate No results for input(s): ESRSEDRATE in the last 72 hours. C-Reactive Protein No results for input(s): CRP in the last 72 hours.  Microbiology: Recent Results (from the past 240 hour(s))  MRSA PCR Screening     Status: None   Collection Time: 06/09/18  3:45 PM  Result Value Ref Range Status   MRSA by PCR NEGATIVE NEGATIVE Final    Comment:        The GeneXpert MRSA Assay (FDA approved for NASAL specimens only), is one component of a comprehensive MRSA colonization surveillance program. It is not intended to diagnose MRSA infection nor to guide or monitor treatment for MRSA infections. Performed at [facility], [place] [address] [place], [place] [address]   Culture, blood (routine x 2)     Status: None   Collection Time: 06/09/18  5:00 PM  Result Value Ref Range Status   Specimen Description BLOOD LEFT ANTECUBITAL  Final   Special Requests  Final    BOTTLES DRAWN AEROBIC ONLY Blood Culture adequate volume   Culture   Final    NO GROWTH 5 DAYS Performed at [facility], [place] [address], [place], [place] [address]    Report Status 06/14/2018 FINAL  Final  Culture, blood (routine x 2)     Status: None   Collection Time: 06/09/18  5:05 PM  Result Value Ref Range Status   Specimen Description BLOOD LEFT ANTECUBITAL  Final   Special Requests   Final    BOTTLES DRAWN AEROBIC ONLY Blood Culture adequate volume   Culture   Final    NO GROWTH 5 DAYS Performed at [facility], [place] [address], [place], [place] [address]    Report Status 06/14/2018 FINAL  Final  Acid Fast Smear (AFB)     Status: None   Collection Time: 06/10/18 10:38 AM  Result Value Ref Range Status   AFB Specimen Processing Concentration  Final    Acid Fast Smear Negative  Final    Comment: (NOTE) Performed At: [facility] [place], [place] [id number] Rush Farmer MD PQ:[id number]    Source (AFB) SPUTUM  Final    Comment: Performed at [facility], [place] [address], [place], [place] [address]  Expectorated sputum assessment w rflx to resp cult     Status: None   Collection Time: 06/12/18  6:44 PM  Result Value Ref Range Status   Specimen Description EXPECTORATED SPUTUM  Final   Special Requests NONE  Final   Sputum evaluation   Final    THIS SPECIMEN IS ACCEPTABLE FOR SPUTUM CULTURE Performed at [facility], [place] [address] [place], [place] [address]    Report Status 06/12/2018 FINAL  Final  Acid Fast Smear (AFB)     Status: None   Collection Time: 06/12/18  6:44 PM  Result Value Ref Range Status   AFB Specimen Processing Concentration  Final   Acid Fast Smear Negative  Final    Comment: (NOTE) Performed At: [facility] [place], [place] [id number] Rush Farmer MD GO:[id number]    Source (AFB) SPUTUM  Final    Comment: Performed at [facility], [place] [address] [place], [place] [address]  Culture, respiratory     Status: None   Collection Time: 06/12/18  6:44 PM  Result Value Ref Range Status   Specimen Description EXPECTORATED SPUTUM  Final   Special Requests NONE Reflexed from [id number]  Final   Gram Stain   Final    ABUNDANT WBC PRESENT, PREDOMINANTLY PMN FEW GRAM POSITIVE RODS FEW YEAST Performed at [facility], [place] [address], [place], [place] [address]    Culture FEW CANDIDA ALBICANS  Final   Report Status 06/14/2018 FINAL  Final  Culture, blood (single)     Status: None (Preliminary result)   Collection Time: 06/15/18  5:40 AM  Result Value Ref Range Status   Specimen Description BLOOD RIGHT HAND  Final   Special Requests   Final    BOTTLES DRAWN AEROBIC ONLY Blood Culture adequate volume   Culture   Final    NO GROWTH 2  DAYS Performed at [facility], [place] [address] [place], [place] [address]    Report Status PENDING  Incomplete  Expectorated sputum assessment w rflx to resp cult     Status: None   Collection Time: 06/17/18  3:37 PM  Result Value Ref Range Status  Specimen Description EXPECTORATED SPUTUM  Final   Special Requests NONE  Final   Sputum evaluation   Final    THIS SPECIMEN IS ACCEPTABLE FOR SPUTUM CULTURE Performed at [facility], [address], [place], [place] [address]    Report Status 06/17/2018 FINAL  Final  Culture, respiratory     Status: None (Preliminary result)   Collection Time: 06/17/18  3:37 PM  Result Value Ref Range Status   Specimen Description EXPECTORATED SPUTUM  Final   Special Requests NONE Reflexed from [id number]  Final   Gram Stain   Final    FEW WBC PRESENT,BOTH PMN AND MONONUCLEAR NO ORGANISMS SEEN    Culture   Final    CULTURE REINCUBATED FOR BETTER GROWTH Performed at [facility], [place] [address] [place],  [address]    Report Status PENDING  Incomplete    Studies/Results: Ct Angio Chest Pe W Or Wo Contrast  Result Date: 06/16/2018 CLINICAL DATA:  Shortness of breath EXAM: CT ANGIOGRAPHY CHEST WITH CONTRAST TECHNIQUE: Multidetector CT imaging of the chest was performed using the standard protocol during bolus administration of intravenous contrast. Multiplanar CT image reconstructions and MIPs were obtained to evaluate the vascular anatomy. CONTRAST:  66mL OMNIPAQUE IOHEXOL 350 MG/ML SOLN COMPARISON:  Jun 09, 2018 CT angiogram chest; chest CT in thoracic MRI Jun 05, 2018 FINDINGS: Cardiovascular: There is no demonstrable pulmonary embolus. There is no thoracic aortic aneurysm or dissection. Visualized great vessels appear normal. No pericardial effusion or pericardial thickening. Mediastinum/Nodes: There remains diffuse enlargement of the left lobe of the thyroid causing deviation of the trachea in the upper thoracic region toward the  right. There is a mildly enhancing mass in the left lobe of the thyroid measuring 2.8 x 2.7 cm. Multiple subcentimeter lymph nodes are evident. No frank thoracic adenopathy evident. The prevertebral soft tissue thickening extending from T6-T10 is better seen on thoracic MR and prior CT with bolus timing not targeted for arterial phase imaging. No new soft tissue lesion evident. No esophageal lesions evident. Lungs/Pleura: There are there are fairly small pleural effusions bilaterally. There is airspace consolidation in both lower lobes. There is a masslike area in the left lower lobe posteriorly measuring 4.8 x 4.3 cm which is increased in size compared to 11 days prior and likely represents either progression of pneumonia or consolidation surrounding a mas areas of consolidation in both upper lobes and lingular regions have increased. Upper abdomen: There is mild reflux of contrast into the inferior vena cava and hepatic veins. There is hepatic steatosis. Visualized upper abdominal structures otherwise appear unremarkable. Musculoskeletal: There are no blastic or lytic bone lesions. No chest wall lesions evident. IMPRESSION: 1. No demonstrable pulmonary embolus. No thoracic aortic aneurysm or dissection. 2. Fairly small right pleural effusions, slightly larger than on recent studies. Multifocal airspace consolidation, progressed. Suspect consolidation surrounding mass in left lower lobe posteriorly. 3.  Subcentimeter lymph nodes without frank adenopathy. 4. Enlarged left lobe of thyroid with dominant thyroid mass measuring 2.8 x 2.7 cm in the left lobe. It may be prudent to correlate with ultrasound given dominant thyroid mass on the left. 5. The paraspinous lesion from T6-T10 noted on recent studies is better seen on recent thoracic MR and chest CT not bolus timed to optimize arterial vascular visualization. Neoplastic etiology in this paraspinous lesion must be of concern as noted recent thoracic MR. 6. Mild  reflux of contrast into the inferior vena cava and hepatic veins may indicate a  degree of increase in right heart pressure. 7.  Hepatic steatosis. Electronically Signed   By: Lowella Grip III M.D.   On: 06/16/2018 17:16   Vas [place] Lower Extremity Venous (dvt)  Result Date: 06/17/2018  Lower Venous Study Indications: Edema.  Performing Technologist: Abram Sander RVS  Examination Guidelines: A complete evaluation includes B-mode imaging, spectral Doppler, color Doppler, and power Doppler as needed of all accessible portions of each vessel. Bilateral testing is considered an integral part of a complete examination. Limited examinations for reoccurring indications may be performed as noted.  +---------+---------------+---------+-----------+----------+--------------+  RIGHT     Compressibility Phasicity Spontaneity Properties Summary         +---------+---------------+---------+-----------+----------+--------------+  CFV       Full            Yes       Yes                                    +---------+---------------+---------+-----------+----------+--------------+  SFJ       Full                                                             +---------+---------------+---------+-----------+----------+--------------+  FV Prox   Full                                                             +---------+---------------+---------+-----------+----------+--------------+  FV Mid    Full                                                             +---------+---------------+---------+-----------+----------+--------------+  FV Distal Full                                                             +---------+---------------+---------+-----------+----------+--------------+  PFV       Full                                                             +---------+---------------+---------+-----------+----------+--------------+  POP       Full            Yes       Yes                                     +---------+---------------+---------+-----------+----------+--------------+  PTV       Full                                                             +---------+---------------+---------+-----------+----------+--------------+  PERO                                                       Not visualized  +---------+---------------+---------+-----------+----------+--------------+   +---------+---------------+---------+-----------+----------+--------------+  LEFT      Compressibility Phasicity Spontaneity Properties Summary         +---------+---------------+---------+-----------+----------+--------------+  CFV       Full            Yes       Yes                                    +---------+---------------+---------+-----------+----------+--------------+  SFJ       Full                                                             +---------+---------------+---------+-----------+----------+--------------+  FV Prox   Full                                                             +---------+---------------+---------+-----------+----------+--------------+  FV Mid    Full                                                             +---------+---------------+---------+-----------+----------+--------------+  FV Distal Full                                                             +---------+---------------+---------+-----------+----------+--------------+  PFV       Full                                                             +---------+---------------+---------+-----------+----------+--------------+  POP       Full            Yes       Yes                                    +---------+---------------+---------+-----------+----------+--------------+  PTV       Full                                                             +---------+---------------+---------+-----------+----------+--------------+  PERO                                                       Not visualized   +---------+---------------+---------+-----------+----------+--------------+     Summary: Right: There is no evidence of deep vein thrombosis in the lower extremity. No cystic structure found in the popliteal fossa. Left: There is no evidence of deep vein thrombosis in the lower extremity. No cystic structure found in the popliteal fossa.  *See table(s) above for measurements and observations. Electronically signed by Harold Barban MD on 06/17/2018 at 1:08:38 PM.    Final      Assessment/Plan: LLL mass/infiltrate T6-10 lesion Thyroid mass  Total days of antibiotics: 8 merrem    1 vanco   Would continue her current anbx She has AFB smears (-), BCx (-) Await Bronch today (and hopefully bx) ? [place] Consider MRI of spine Consider thyroid bx         Bobby Rumpf MD, FACP Infectious Diseases (pager) [phone] [email or web address] 06/18/2018, 10:01 AM  LOS: 9 days

## 2018-06-18 NOTE — Procedures (Signed)
Bronchoscopy Procedure Note Donnie Gedeon [id number] May 18, 1950  Procedure: Bronchoscopy Indications: Diagnostic evaluation of the airways and Obtain specimens for culture and/or other diagnostic studies  Procedure Details Consent: Risks of procedure as well as the alternatives and risks of each were explained to the (patient/caregiver).  Consent for procedure obtained. Time Out: Verified patient identification, verified procedure, site/side was marked, verified correct patient position, special equipment/implants available, medications/allergies/relevent history reviewed, required imaging and test results available.  Performed  In preparation for procedure, bronchoscope lubricated. Sedation: 2 mg Versed and 150 mcg of fentanyl in divided doses  Airway entered and the following bronchi were examined: Bronchi.   Findings -upper airway appeared normal, vocal cord showed normal appearance and motion, there was irregularity around distal trachea and carina extending into right and left mainstem bronchi.  Right-sided airways appeared inflamed, no endobronchial lesion noted on left-sided airways either.  Minimal secretions Brushings were obtained from the distal trachea, carina and mainstem bronchi.  Endobronchial biopsies were also obtained from this area with bleeding about 5 to 10 cc. BAL was obtained from the left lower lobe  Procedures performed: BAL, Brushings and endobronchial biopsies performed from trachea and mainstem bronchi Bronchoscope removed.  , Patient placed back on 100% FiO2 at conclusion of procedure.    Evaluation Hemodynamic Status: BP stable throughout; O2 sats: transiently fell during during procedure Patient's Current Condition: stable Specimens: Brushings, BAL from left lower lobe, endobronchial biopsies Complications: No apparent complications Patient did tolerate procedure well.   Leanna Sato [name] 06/18/2018

## 2018-06-18 NOTE — Progress Notes (Signed)
[facility] Progress Note  Patient: Julia Johns [id number]   PCP: Patient, No Pcp Per DOB: [birth year]   DOA: 06/09/2018   DOS: 06/18/2018   Date of Service: the patient was seen and examined on 06/18/2018  Brief hospital course: Pt. with no significant PMH; admitted on 06/09/2018, presented with complaint of cough and fever, was found to have CAP. Currently further plan is continue IV Antibiotics.  Subjective:    tmax 100.3, rr ranged from 22-27 Continue to have congested cough with minimal sputum production, she is on 3 liter o2, does not appear to be in respiratory distress at rest, she is sitting up in chair  She continue to have  bilateral lower extremity edema No fever No nausea no vomiting.  She is to have bronchoscopy today   Assessment and Plan: Acute hypoxemic respiratory failure Left lower lobe rounded density, in the setting of fever cough sputum production, scant hemoptysis, leukocytosis community-acquired pneumonia Acute diastolic heart failure Paraspinal fluid collection Neurosurgery recommends no intervention for now.  Neurology feels that the patient does not require any intervention since it is not affecting spine. Sputum cultures as well as AFB smear, per infectious disease CT surgery recommends no intervention for now. PCCM consulted, recommended defer bronchoscopy and plan for repeat imaging in 4 to 6 weeks to ensure resolution of left lower lobe density. If there is no resolution of the density on imaging after treatment she will need bronchoscopy. QuantiFERON-TB gold is indeterminant. AFB smear negative x2. per ID patient can come off of isolation. ESR 124, CRP 38.1 ANCA titers, GBM titers currently pending. HIV antibody negative. MRSA PCR negative COVID x2 negative in the other facility Provide Tussionex and other cough suppressant. PCCM recommends no indication for bronc for now. Recommend further work-up for vasculitis which is  currently ordered. ID currently following. We will continue with IV diuresis aggressively. Patient's in and out are not accurate at all.  Recommend purvick for now.  Bronchoscopy planned for tomorrow, will follow pulm/ID recommendations  Bilateral proximal muscle weakness both upper and lower extremity. Patient reports this is ongoing for last 7 days. ESR is elevated. May represent PMR or other inflammatory condition. May also represent association with sepsis. Monitor for now.   Right upper lobe pulmonary nodule She will need follow-up for the small subcentimeter right upper lobe pulmonary nodule, follow-up for this will be noncontrasted CT 9 to 12 months.  Bilateral lower extremity edema Echo with preserved EF, venous doppler negative for DVT She is on iv lasix Close monitor intake and output  Abdominal pain. Colitis. Nausea and vomiting. Due to her worsening severe abdominal pain as well as nausea and vomiting a CT abdomen pelvis with contrast was performed. Evidence of persistent colitis without any worsening. No other acute abnormality. C. difficile was ordered but not sent as the patient did not have any further diarrhea.  Constipation. Provide stool softener.Hold for now.  Obesity: Body mass index is 32.7 kg/m.   Diet: Cardiac diet DVT Prophylaxis: subcutaneous Heparin  Advance goals of care discussion: Full code  Family Communication: no family was present at bedside, at the time of interview.  Discussed with daughter-in-law on 06/16/2018.  Disposition:  Discharge to be determined . Need to be cleared by pulm and ID  Consultants: Neurology, neurosurgery, pulmonary, cardiothoracic surgery, infectious disease Procedures: none  Scheduled Meds: . benzonatate  100 mg Oral TID  . chlorpheniramine-HYDROcodone  5 mL Oral Q12H  . furosemide  80 mg Intravenous BID  .  guaiFENesin  600 mg Oral BID  . methocarbamol  500 mg Oral TID  . sodium chloride flush  3 mL  Intravenous Q12H   Continuous Infusions: . meropenem (MERREM) IV 1 g (06/18/18 0517)  . vancomycin 1,250 mg (06/17/18 2320)   PRN Meds: acetaminophen **OR** acetaminophen, ipratropium-albuterol, lip balm, menthol-cetylpyridinium, morphine injection, ondansetron **OR** ondansetron (ZOFRAN) IV, oxyCODONE, phenol, simethicone, zolpidem Antibiotics: Anti-infectives (From admission, onward)   Start     Dose/Rate Route Frequency Ordered Stop   06/17/18 2300  vancomycin (VANCOCIN) 1,250 mg in sodium chloride 0.9 % 250 mL IVPB     1,250 mg 166.7 mL/hr over 90 Minutes Intravenous Every 12 hours 06/17/18 1027     06/17/18 1100  vancomycin (VANCOCIN) 2,000 mg in sodium chloride 0.9 % 500 mL IVPB     2,000 mg 250 mL/hr over 120 Minutes Intravenous  Once 06/17/18 1027 06/17/18 2320   06/10/18 1400  meropenem (MERREM) 1 g in sodium chloride 0.9 % 100 mL IVPB     1 g 200 mL/hr over 30 Minutes Intravenous Every 8 hours 06/10/18 1120         Objective: Physical Exam: Vitals:   06/17/18 1918 06/17/18 2323 06/18/18 0254 06/18/18 0453  BP: (!) 102/50 117/64 (!) 96/52   Pulse: (!) 108 (!) 104 96   Resp: (!) 24 19 (!) 22   Temp: 99.1 F (37.3 C) 100 F (37.8 C) 100.3 F (37.9 C)   TempSrc: Oral Oral Oral   SpO2: 100% 94% 95%   Weight:    94.7 kg  Height:        Intake/Output Summary (Last 24 hours) at 06/18/2018 0725 Last data filed at 06/18/2018 0600 Gross per 24 hour  Intake 1050 ml  Output 400 ml  Net 650 ml   Filed Weights   06/09/18 1545 06/18/18 0453  Weight: 93.1 kg 94.7 kg   General: Alert, Awake and Oriented to Time, Place and Person. Appear in mild distress, affect appropriate Eyes: PERRL, Conjunctiva normal ENT: Oral Mucosa clear moist. Neck: no JVD, no Abnormal Mass Or lumps Cardiovascular: S1 and S2 Present, no Murmur, Peripheral Pulses Present Respiratory: normal respiratory effort, Bilateral Air entry equal and Decreased, no use of accessory muscle, Clear to  Auscultation, no Crackles, no wheezes Abdomen: Bowel Sound present, Soft and no tenderness, no hernia Skin: no redness, no Rash, no induration Extremities: bilateral lower extremity pitting  edema, no calf tenderness Neurologic: Grossly no focal neuro deficit. Bilaterally Equal motor strength  Data Reviewed: CBC: Recent Labs  Lab 06/13/18 0944 06/14/18 0528 06/15/18 0540 06/16/18 0436 06/17/18 0423  WBC 12.3* 14.0* 13.9* 14.9* 14.0*  HGB 11.5* 12.0 11.6* 11.3* 10.9*  HCT 35.8* 38.1 35.9* 35.1* 34.0*  MCV 87.3 89.2 87.6 87.3 87.6  PLT 466* 501* 507* 554* 144*   Basic Metabolic Panel: Recent Labs  Lab 06/13/18 0944 06/14/18 0528 06/15/18 0540 06/16/18 0436 06/17/18 0423  NA 134* 135 135 135 134*  K 3.4* 4.3 4.1 3.7 3.7  CL 95* 98 95* 92* 89*  CO2 _0 32 33*  GLUCOSE 163* 154* 138* 151* 177*  BUN _1 CREATININE 0.59 0.53 0.55 0.53 0.63  CALCIUM 8.4* 8.5* 8.4* 8.4* 8.2*  MG  --   --   --   --  2.2    Liver Function Tests: Recent Labs  Lab 06/17/18 0423  AST 44*  ALT 37  ALKPHOS 130*  BILITOT 0.7  PROT 6.3*  ALBUMIN  1.8*   No results for input(s): LIPASE, AMYLASE in the last 168 hours. No results for input(s): AMMONIA in the last 168 hours. Coagulation Profile: No results for input(s): INR, PROTIME in the last 168 hours. Cardiac Enzymes: No results for input(s): CKTOTAL, CKMB, CKMBINDEX, TROPONINI in the last 168 hours. BNP (last 3 results) No results for input(s): PROBNP in the last 8760 hours. CBG: No results for input(s): GLUCAP in the last 168 hours. Studies: Vas [place] Lower Extremity Venous (dvt)  Result Date: 06/17/2018  Lower Venous Study Indications: Edema.  Performing Technologist: Abram Sander RVS  Examination Guidelines: A complete evaluation includes B-mode imaging, spectral Doppler, color Doppler, and power Doppler as needed of all accessible portions of each vessel. Bilateral testing is considered an integral part of a complete  examination. Limited examinations for reoccurring indications may be performed as noted.  +---------+---------------+---------+-----------+----------+--------------+ RIGHT    CompressibilityPhasicitySpontaneityPropertiesSummary        +---------+---------------+---------+-----------+----------+--------------+ CFV      Full           Yes      Yes                                 +---------+---------------+---------+-----------+----------+--------------+ SFJ      Full                                                        +---------+---------------+---------+-----------+----------+--------------+ FV Prox  Full                                                        +---------+---------------+---------+-----------+----------+--------------+ FV Mid   Full                                                        +---------+---------------+---------+-----------+----------+--------------+ FV DistalFull                                                        +---------+---------------+---------+-----------+----------+--------------+ PFV      Full                                                        +---------+---------------+---------+-----------+----------+--------------+ POP      Full           Yes      Yes                                 +---------+---------------+---------+-----------+----------+--------------+ PTV      Full                                                        +---------+---------------+---------+-----------+----------+--------------+  PERO                                                  Not visualized +---------+---------------+---------+-----------+----------+--------------+   +---------+---------------+---------+-----------+----------+--------------+ LEFT     CompressibilityPhasicitySpontaneityPropertiesSummary        +---------+---------------+---------+-----------+----------+--------------+ CFV      Full           Yes       Yes                                 +---------+---------------+---------+-----------+----------+--------------+ SFJ      Full                                                        +---------+---------------+---------+-----------+----------+--------------+ FV Prox  Full                                                        +---------+---------------+---------+-----------+----------+--------------+ FV Mid   Full                                                        +---------+---------------+---------+-----------+----------+--------------+ FV DistalFull                                                        +---------+---------------+---------+-----------+----------+--------------+ PFV      Full                                                        +---------+---------------+---------+-----------+----------+--------------+ POP      Full           Yes      Yes                                 +---------+---------------+---------+-----------+----------+--------------+ PTV      Full                                                        +---------+---------------+---------+-----------+----------+--------------+ PERO  Not visualized +---------+---------------+---------+-----------+----------+--------------+     Summary: Right: There is no evidence of deep vein thrombosis in the lower extremity. No cystic structure found in the popliteal fossa. Left: There is no evidence of deep vein thrombosis in the lower extremity. No cystic structure found in the popliteal fossa.  *See table(s) above for measurements and observations. Electronically signed by Harold Barban MD on 06/17/2018 at 1:08:38 PM.    Final      Time spent: 25 minutes  Author:  Florencia Reasons, MD PhD [facility] 06/18/2018 7:25 AM  To reach On-call, see care teams to locate the attending and reach out to them via [email or web address]. If 7PM-7AM,  please contact night-coverage If you still have difficulty reaching the attending provider, please page the [facility] (Director on Call) for [facility] on amion for assistance.

## 2018-06-19 ENCOUNTER — Encounter (HOSPITAL_COMMUNITY): Payer: Self-pay | Admitting: Pulmonary Disease

## 2018-06-19 DIAGNOSIS — R509 Fever, unspecified: Secondary | ICD-10-CM

## 2018-06-19 DIAGNOSIS — R740 Nonspecific elevation of levels of transaminase and lactic acid dehydrogenase [LDH]: Secondary | ICD-10-CM

## 2018-06-19 LAB — BASIC METABOLIC PANEL
Anion gap: 14 (ref 5–15)
BUN: 18 mg/dL (ref 8–23)
CO2: 35 mmol/L — ABNORMAL HIGH (ref 22–32)
Calcium: 8.2 mg/dL — ABNORMAL LOW (ref 8.9–10.3)
Chloride: 88 mmol/L — ABNORMAL LOW (ref 98–111)
Creatinine, Ser: 0.52 mg/dL (ref 0.44–1.00)
GFR calc Af Amer: >60 mL/min (ref >60)
GFR calc non Af Amer: >60 mL/min (ref >60)
Glucose, Bld: 212 mg/dL — ABNORMAL HIGH (ref 70–99)
Potassium: 3.4 mmol/L — ABNORMAL LOW (ref 3.5–5.1)
Sodium: 137 mmol/L (ref 135–145)

## 2018-06-19 LAB — CULTURE, RESPIRATORY W GRAM STAIN

## 2018-06-19 LAB — HEPATIC FUNCTION PANEL
ALT: 71 U/L — ABNORMAL HIGH (ref 0–44)
AST: 104 U/L — ABNORMAL HIGH (ref 15–41)
Albumin: 1.6 g/dL — ABNORMAL LOW (ref 3.5–5.0)
Alkaline Phosphatase: 206 U/L — ABNORMAL HIGH (ref 38–126)
Bilirubin, Direct: 0.1 mg/dL (ref 0.0–0.2)
Indirect Bilirubin: 0.5 mg/dL (ref 0.3–0.9)
Total Bilirubin: 0.6 mg/dL (ref 0.3–1.2)
Total Protein: 6.5 g/dL (ref 6.5–8.1)

## 2018-06-19 LAB — CBC WITH DIFFERENTIAL/PLATELET
Abs Immature Granulocytes: 0.12 10*3/uL — ABNORMAL HIGH (ref 0.00–0.07)
Basophils Absolute: 0 10*3/uL (ref 0.0–0.1)
Basophils Relative: 0 %
Eosinophils Absolute: 0.2 10*3/uL (ref 0.0–0.5)
Eosinophils Relative: 1 %
HCT: 32.9 % — ABNORMAL LOW (ref 36.0–46.0)
Hemoglobin: 10.5 g/dL — ABNORMAL LOW (ref 12.0–15.0)
Immature Granulocytes: 1 %
Lymphocytes Relative: 7 %
Lymphs Abs: 0.9 10*3/uL (ref 0.7–4.0)
MCH: 27.8 pg (ref 26.0–34.0)
MCHC: 31.9 g/dL (ref 30.0–36.0)
MCV: 87 fL (ref 80.0–100.0)
Monocytes Absolute: 0.4 10*3/uL (ref 0.1–1.0)
Monocytes Relative: 3 %
Neutro Abs: 12.2 10*3/uL — ABNORMAL HIGH (ref 1.7–7.7)
Neutrophils Relative %: 88 %
Platelets: 633 10*3/uL — ABNORMAL HIGH (ref 150–400)
RBC: 3.78 MIL/uL — ABNORMAL LOW (ref 3.87–5.11)
RDW: 14.1 % (ref 11.5–15.5)
WBC: 13.8 10*3/uL — ABNORMAL HIGH (ref 4.0–10.5)
nRBC: 0 % (ref 0.0–0.2)

## 2018-06-19 LAB — ACID FAST SMEAR (AFB, MYCOBACTERIA): Acid Fast Smear: NEGATIVE

## 2018-06-19 MED ORDER — SENNOSIDES-DOCUSATE SODIUM 8.6-50 MG PO TABS
1.0000 | ORAL_TABLET | Freq: Two times a day (BID) | ORAL | Status: DC
  Administered 2018-06-19 – 2018-06-26 (×14): 1 via ORAL
  Filled 2018-06-19 (×14): qty 1

## 2018-06-19 NOTE — Progress Notes (Signed)
[facility] Progress Note  Patient: Julia Johns GYI:[id number]   PCP: Patient, No Pcp Per DOB: Jul 29, 1950   DOA: 06/09/2018   DOS: 06/19/2018   Date of Service: the patient was seen and examined on 06/19/2018  Brief hospital course: Pt. with no significant PMH; admitted on 06/09/2018, presented with complaint of cough and fever, was found to have CAP. Currently further plan is continue IV Antibiotics.  Subjective:   She underwent bronchoscopy yesterday tmax 100.7, rr ranged from 18-30  Appear to have less  congested cough  Today,  she is on 2 liter o2, does not appear to be in respiratory distress at rest, she is sitting up in chair  She continue to have  bilateral lower extremity edema, now she has compression stocking on No fever No nausea no vomiting.     Assessment and Plan: Acute hypoxemic respiratory failure Left lower lobe rounded density, in the setting of fever cough sputum production, scant hemoptysis, leukocytosis community-acquired pneumonia Acute diastolic heart failure Paraspinal fluid collection Neurosurgery recommends no intervention for now.  Neurology feels that the patient does not require any intervention since it is not affecting spine. Sputum cultures as well as AFB smear, per infectious disease CT surgery recommends no intervention for now. PCCM consulted, recommended defer bronchoscopy and plan for repeat imaging in 4 to 6 weeks to ensure resolution of left lower lobe density. If there is no resolution of the density on imaging after treatment she will need bronchoscopy. QuantiFERON-TB gold is indeterminant. AFB smear negative x2. per ID patient can come off of isolation. ESR 124, CRP 38.1 ANCA titers, GBM titers currently pending. HIV antibody negative. MRSA PCR negative COVID x2 negative in the other facility Provide Tussionex and other cough suppressant. PCCM recommends no indication for bronc for now. Recommend further work-up for  vasculitis which is currently ordered. ID currently following. We will continue with IV diuresis aggressively.  Bronchoscopy on 5/28, will follow pulm/ID recommendations  Bilateral proximal muscle weakness both upper and lower extremity. Patient reports this is ongoing for last 7 days. ESR is elevated. May represent PMR or other inflammatory condition. May also represent association with sepsis. Monitor for now.   Right upper lobe pulmonary nodule She will need follow-up for the small subcentimeter right upper lobe pulmonary nodule, follow-up for this will be noncontrasted CT 9 to 12 months.  Bilateral lower extremity edema Echo with preserved EF, venous doppler negative for DVT She is on iv lasix Close monitor intake and output  Abdominal pain. Colitis. Nausea and vomiting. Due to her worsening severe abdominal pain as well as nausea and vomiting a CT abdomen pelvis with contrast was performed. Evidence of persistent colitis without any worsening. No other acute abnormality. C. difficile was ordered but not sent as the patient did not have any further diarrhea.  Constipation. Provide stool softener.Hold for now.  Obesity: Body mass index is 32.45 kg/m.   Diet: Cardiac diet DVT Prophylaxis: subcutaneous Heparin  Advance goals of care discussion: Full code  Family Communication: no family was present at bedside, at the time of interview.  Discussed with daughter-in-law on 06/16/2018.  Disposition:  Discharge to be determined . Need to be cleared by pulm and ID  Consultants: Neurology, neurosurgery, pulmonary, cardiothoracic surgery, infectious disease Procedures: none  Scheduled Meds: . benzonatate  100 mg Oral TID  . chlorpheniramine-HYDROcodone  5 mL Oral Q12H  . furosemide  80 mg Intravenous BID  . guaiFENesin  600 mg Oral BID  . methocarbamol  500 mg Oral TID  . midazolam  2 mg Intravenous Once  . sodium chloride flush  3 mL Intravenous Q12H    Continuous Infusions: . sodium chloride 10 mL/hr at 06/18/18 1838  . meropenem (MERREM) IV 1 g (06/19/18 5397)  . vancomycin 1,250 mg (06/18/18 2246)   PRN Meds: acetaminophen **OR** acetaminophen, ipratropium-albuterol, lip balm, menthol-cetylpyridinium, morphine injection, ondansetron **OR** ondansetron (ZOFRAN) IV, oxyCODONE, phenol, simethicone, zolpidem Antibiotics: Anti-infectives (From admission, onward)   Start     Dose/Rate Route Frequency Ordered Stop   06/17/18 2300  vancomycin (VANCOCIN) 1,250 mg in sodium chloride 0.9 % 250 mL IVPB     1,250 mg 166.7 mL/hr over 90 Minutes Intravenous Every 12 hours 06/17/18 1027     06/17/18 1100  vancomycin (VANCOCIN) 2,000 mg in sodium chloride 0.9 % 500 mL IVPB     2,000 mg 250 mL/hr over 120 Minutes Intravenous  Once 06/17/18 1027 06/17/18 2320   06/10/18 1400  meropenem (MERREM) 1 g in sodium chloride 0.9 % 100 mL IVPB     1 g 200 mL/hr over 30 Minutes Intravenous Every 8 hours 06/10/18 1120         Objective: Physical Exam: Vitals:   06/18/18 2054 06/18/18 2351 06/19/18 0314 06/19/18 0808  BP: 117/86 (!) 100/49 (!) 101/53 (!) 115/59  Pulse: (!) 124 (!) 108 97 96  Resp:  (!) 23 (!) 25 (!) 26  Temp:  99.4 F (37.4 C) (!) 100.7 F (38.2 C) 98.6 F (37 C)  TempSrc:  Oral Oral Oral  SpO2: (!) 88% 94% 98% 93%  Weight:   94 kg   Height:        Intake/Output Summary (Last 24 hours) at 06/19/2018 1247 Last data filed at 06/19/2018 0657 Gross per 24 hour  Intake 1059.47 ml  Output 1000 ml  Net 59.47 ml   Filed Weights   06/09/18 1545 06/18/18 0453 06/19/18 0314  Weight: 93.1 kg 94.7 kg 94 kg   General: Alert, Awake and Oriented to Time, Place and Person. Appear in mild distress, affect appropriate Eyes: PERRL, Conjunctiva normal ENT: Oral Mucosa clear moist. Neck: no JVD, no Abnormal Mass Or lumps Cardiovascular: S1 and S2 Present, no Murmur, Peripheral Pulses Present Respiratory: normal respiratory effort, Bilateral Air  entry equal and Decreased, no use of accessory muscle, Clear to Auscultation, no Crackles, no wheezes Abdomen: Bowel Sound present, Soft and no tenderness, no hernia Skin: no redness, no Rash, no induration Extremities: bilateral lower extremity pitting  edema, no calf tenderness Neurologic: Grossly no focal neuro deficit. Bilaterally Equal motor strength  Data Reviewed: CBC: Recent Labs  Lab 06/14/18 0528 06/15/18 0540 06/16/18 0436 06/17/18 0423 06/19/18 0650  WBC 14.0* 13.9* 14.9* 14.0* 13.8*  NEUTROABS  --   --   --   --  12.2*  HGB 12.0 11.6* 11.3* 10.9* 10.5*  HCT 38.1 35.9* 35.1* 34.0* 32.9*  MCV 89.2 87.6 87.3 87.6 87.0  PLT 501* 507* 554* 514* 673*   Basic Metabolic Panel: Recent Labs  Lab 06/14/18 0528 06/15/18 0540 06/16/18 0436 06/17/18 0423 06/19/18 0650  NA 135 135 135 134* 137  K 4.3 4.1 3.7 3.7 3.4*  CL 98 95* 92* 89* 88*  CO2 27 30 32 33* 35*  GLUCOSE 154* 138* 151* 177* 212*  BUN 12 12 13 17 18   CREATININE 0.53 0.55 0.53 0.63 0.52  CALCIUM 8.5* 8.4* 8.4* 8.2* 8.2*  MG  --   --   --  2.2  --  Liver Function Tests: Recent Labs  Lab 06/17/18 0423 06/19/18 0650  AST 44* 104*  ALT 37 71*  ALKPHOS 130* 206*  BILITOT 0.7 0.6  PROT 6.3* 6.5  ALBUMIN 1.8* 1.6*   No results for input(s): LIPASE, AMYLASE in the last 168 hours. No results for input(s): AMMONIA in the last 168 hours. Coagulation Profile: No results for input(s): INR, PROTIME in the last 168 hours. Cardiac Enzymes: No results for input(s): CKTOTAL, CKMB, CKMBINDEX, TROPONINI in the last 168 hours. BNP (last 3 results) No results for input(s): PROBNP in the last 8760 hours. CBG: No results for input(s): GLUCAP in the last 168 hours. Studies: Dg C-arm 1-60 Min-no Report  Result Date: 06/18/2018 Fluoroscopy was utilized by the requesting physician.  No radiographic interpretation.     Time spent: 25 minutes  Author:  Florencia Reasons, MD PhD [facility] 06/19/2018 12:47 PM   To reach On-call, see care teams to locate the attending and reach out to them via [email or web address]. If 7PM-7AM, please contact night-coverage If you still have difficulty reaching the attending provider, please page the [facility] (Director on Call) for [facility] on amion for assistance.

## 2018-06-19 NOTE — Progress Notes (Signed)
Physical Therapy Treatment Patient Details Name: Julia Johns MRN: [id number] DOB: Mar 19, 1950 Today's Date: 06/19/2018    History of Present Illness This is a 68 year old female who presented as a transfer from [facility] with complaints of shortness of breath and blood-tinged sputum.  Patient underwent CT imaging which revealed a paraspinal soft tissue thickening from T6-T10 as well as a left lower lobe density within the CT which was enlarged from prior imaging concerning for an inflammatory/infectious etiology.  Patient was found to be febrile.  Patient was admitted to [facility] under airborne isolation, COVID negative, pending workup AFB/ quantiferon.  Pulmonary was consulted for recommendations and management of the left lower lobe density.     PT Comments    Patient doing well today, ambulating in room without physical assistance, use of RW, desat quickly on RA to 81%, placed back on 3L satting well. Improved independence with mobility, will work on tolerance and update recs to HHPT if appropriate.   Follow Up Recommendations  SNF(progressing well, may be able to progress to HHPT )     Equipment Recommendations  (TBD next venue)    Recommendations for Other Services       Precautions / Restrictions Precautions Precautions: Fall Restrictions Weight Bearing Restrictions: No    Mobility  Bed Mobility               General bed mobility comments: in chair at entry  Transfers Overall transfer level: Needs assistance Equipment used: Rolling walker (2 wheeled) Transfers: Sit to/from Stand Sit to Stand: Supervision            Ambulation/Gait Ambulation/Gait assistance: Min guard Gait Distance (Feet): 20 Feet Assistive device: Rolling walker (2 wheeled) Gait Pattern/deviations: Step-to pattern Gait velocity: decreased   General Gait Details: ambulating 20 feeet, de sat to 81% on RA, returned to 3L on wall quickly rose back to 95%   Stairs              Wheelchair Mobility    Modified Rankin (Stroke Patients Only)       Balance Overall balance assessment: Needs assistance   Sitting balance-Leahy Scale: Good       Standing balance-Leahy Scale: Poor                              Cognition Arousal/Alertness: Awake/alert Behavior During Therapy: WFL for tasks assessed/performed Overall Cognitive Status: Within Functional Limits for tasks assessed                                        Exercises      General Comments        Pertinent Vitals/Pain Faces Pain Scale: Hurts little more Pain Location: Chest pain  Pain Descriptors / Indicators: Aching    Home Living                      Prior Function            PT Goals (current goals can now be found in the care plan section) Acute Rehab PT Goals Patient Stated Goal: get stronger PT Goal Formulation: With patient Time For Goal Achievement: 07/01/18 Potential to Achieve Goals: Good Progress towards PT goals: Progressing toward goals    Frequency    Min 3X/week      PT Plan  Co-evaluation              AM-PAC PT "6 Clicks" Mobility   Outcome Measure  Help needed turning from your back to your side while in a flat bed without using bedrails?: A Little Help needed moving from lying on your back to sitting on the side of a flat bed without using bedrails?: A Little Help needed moving to and from a bed to a chair (including a wheelchair)?: A Lot Help needed standing up from a chair using your arms (e.g., wheelchair or bedside chair)?: A Lot Help needed to walk in hospital room?: A Lot Help needed climbing 3-5 steps with a railing? : Total 6 Click Score: 13    End of Session Equipment Utilized During Treatment: Gait belt Activity Tolerance: Patient tolerated treatment well Patient left: in bed Nurse Communication: Mobility status PT Visit Diagnosis: Unsteadiness on feet (R26.81)     Time:  5789-7847 PT Time Calculation (min) (ACUTE ONLY): 20 min  Charges:  $Gait Training: 8-22 mins                     Reinaldo Berber, PT, DPT Acute Rehabilitation Services Pager: [phone] Office: [place] 06/19/2018, 11:31 AM

## 2018-06-19 NOTE — Progress Notes (Signed)
PCCM continues to follow bronchoscopy results.  Will follow up with patient once results returned. Please call sooner if new needs arise.    Noe Gens, NP-C [facility] Pgr: [phone] or if no answer [phone] 06/19/2018, 10:39 AM

## 2018-06-19 NOTE — Progress Notes (Signed)
Spoke with path- cytology consistent with cancer.  Biopsy still pending.  Notified CCM.   I have NOT discussed with patient.

## 2018-06-19 NOTE — Progress Notes (Signed)
[facility] for Infectious Disease  Date of Admission:  06/09/2018   Total days of antibiotics 10        Day 10 Meropenum        Day 3 Vancomycin         ASSESSMENT: Pneumonia with enlarging LLL pulmonary mass likely representing infectious etiology vs inflammatory. Underwent bronchoscopy with washing and biopsies taken, awaiting results.  Patient continues to have intermittent fevers overnight with the highest recorded at 100.7. Supplemental Oxygen use decreasing.  Mild transaminitis and alk phos, will need to continue to trend this in the setting of meropenem use.   PLAN: 1. Continue meropenum  2. Continue vancomycin 3. F/up results of bronch, AFB, cytology, fungal collected and in or in process  Principal Problem:   Pneumonia Active Problems:   Mass of lower lobe of left lung   Colitis   Paravertebral mass   Acute respiratory failure with hypoxia (HCC)   Bilateral lower extremity edema   Obesity (BMI 30.0-34.9)   Scheduled Meds: . benzonatate  100 mg Oral TID  . chlorpheniramine-HYDROcodone  5 mL Oral Q12H  . furosemide  80 mg Intravenous BID  . guaiFENesin  600 mg Oral BID  . methocarbamol  500 mg Oral TID  . midazolam  2 mg Intravenous Once  . sodium chloride flush  3 mL Intravenous Q12H   Continuous Infusions: . sodium chloride 10 mL/hr at 06/18/18 1838  . meropenem (MERREM) IV 1 g (06/19/18 7253)  . vancomycin 1,250 mg (06/18/18 2246)   PRN Meds:.acetaminophen **OR** acetaminophen, ipratropium-albuterol, lip balm, menthol-cetylpyridinium, morphine injection, ondansetron **OR** ondansetron (ZOFRAN) IV, oxyCODONE, phenol, simethicone, zolpidem   SUBJECTIVE: The patient stated again today that she feels as if she is improving. She denied fever, chills but continues to endorse mild hemoptysis. She is in agreement with the plan to continue antibiotics until a more definitive diagnosis can be made.  Review of Systems: Review of Systems   Constitutional: Negative for chills, fever and malaise/fatigue.  Respiratory: Positive for cough, hemoptysis, sputum production and shortness of breath (But improving). Negative for wheezing.   Cardiovascular: Positive for chest pain (Left lower rib pain, worse with coughing). Negative for claudication.  Gastrointestinal: Negative for abdominal pain, diarrhea and vomiting.  Musculoskeletal: Negative for myalgias.  Skin: Negative for itching and rash.    Allergies  Allergen Reactions  . Penicillins Anaphylaxis    Patient stated that within a few minutes of receiving a penicillin IM injection she rapidly became unconscious and required "additional" medication to recover. This occurred when she was ~68 years old and she does not recall what was given to assist in her recovery. She did not develop a rash.     OBJECTIVE: Vitals:   06/18/18 2054 06/18/18 2351 06/19/18 0314 06/19/18 0808  BP: 117/86 (!) 100/49 (!) 101/53 (!) 115/59  Pulse: (!) 124 (!) 108 97 96  Resp:  (!) 23 (!) 25 (!) 26  Temp:  99.4 F (37.4 C) (!) 100.7 F (38.2 C) 98.6 F (37 C)  TempSrc:  Oral Oral Oral  SpO2: (!) 88% 94% 98% 93%  Weight:   94 kg   Height:       Body mass index is 32.45 kg/m.  Physical Exam Constitutional:      General: She is not in acute distress.    Appearance: Normal appearance. She is not ill-appearing, toxic-appearing or diaphoretic.  HENT:     Head: Normocephalic and atraumatic.  Eyes:     Conjunctiva/sclera:     Right eye: Right conjunctiva is injected.  Cardiovascular:     Rate and Rhythm: Normal rate and regular rhythm.     Heart sounds: No murmur.  Pulmonary:     Effort: Pulmonary effort is normal.     Breath sounds: Normal breath sounds. No wheezing or rhonchi.  Neurological:     Mental Status: She is alert and oriented to person, place, and time.     Lab Results Lab Results  Component Value Date   WBC 13.8 (H) 06/19/2018   HGB 10.5 (L) 06/19/2018   HCT 32.9 (L)  06/19/2018   MCV 87.0 06/19/2018   PLT 633 (H) 06/19/2018    Lab Results  Component Value Date   CREATININE 0.52 06/19/2018   BUN 18 06/19/2018   NA 137 06/19/2018   K 3.4 (L) 06/19/2018   CL 88 (L) 06/19/2018   CO2 35 (H) 06/19/2018    Lab Results  Component Value Date   ALT 71 (H) 06/19/2018   AST 104 (H) 06/19/2018   ALKPHOS 206 (H) 06/19/2018   BILITOT 0.6 06/19/2018     Microbiology: Recent Results (from the past 240 hour(s))  MRSA PCR Screening     Status: None   Collection Time: 06/09/18  3:45 PM  Result Value Ref Range Status   MRSA by PCR NEGATIVE NEGATIVE Final    Comment:        The GeneXpert MRSA Assay (FDA approved for NASAL specimens only), is one component of a comprehensive MRSA colonization surveillance program. It is not intended to diagnose MRSA infection nor to guide or monitor treatment for MRSA infections. Performed at [facility], [place] [address] [place], [place] [address]   Culture, blood (routine x 2)     Status: None   Collection Time: 06/09/18  5:00 PM  Result Value Ref Range Status   Specimen Description BLOOD LEFT ANTECUBITAL  Final   Special Requests   Final    BOTTLES DRAWN AEROBIC ONLY Blood Culture adequate volume   Culture   Final    NO GROWTH 5 DAYS Performed at [facility], [place] [address], [place], [place] [address]    Report Status 06/14/2018 FINAL  Final  Culture, blood (routine x 2)     Status: None   Collection Time: 06/09/18  5:05 PM  Result Value Ref Range Status   Specimen Description BLOOD LEFT ANTECUBITAL  Final   Special Requests   Final    BOTTLES DRAWN AEROBIC ONLY Blood Culture adequate volume   Culture   Final    NO GROWTH 5 DAYS Performed at [facility], [place] [address] [place], [place] [address]    Report Status 06/14/2018 FINAL  Final  Acid Fast Smear (AFB)     Status: None   Collection Time: 06/10/18 10:38 AM  Result Value Ref Range Status   AFB Specimen Processing Concentration   Final   Acid Fast Smear Negative  Final    Comment: (NOTE) Performed At: [facility] [place], [place] [id number] Rush Farmer MD JS:[id number]    Source (AFB) SPUTUM  Final    Comment: Performed at [facility], [place] [address], [place],  [address]  Expectorated sputum assessment w rflx to resp cult     Status: None   Collection Time: 06/12/18  6:44 PM  Result Value Ref Range Status   Specimen Description EXPECTORATED SPUTUM  Final   Special Requests NONE  Final   Sputum evaluation   Final    THIS SPECIMEN IS ACCEPTABLE FOR SPUTUM CULTURE Performed at [facility], [address], [place], [place] [address]    Report Status 06/12/2018 FINAL  Final  Acid Fast Smear (AFB)     Status: None   Collection Time: 06/12/18  6:44 PM  Result Value Ref Range Status   AFB Specimen Processing Concentration  Final   Acid Fast Smear Negative  Final    Comment: (NOTE) Performed At: [facility] [place], [place] [id number] Rush Farmer MD [id number]    Source (AFB) SPUTUM  Final    Comment: Performed at [facility], [place] [address], [place], [place] [address]  Culture, respiratory     Status: None   Collection Time: 06/12/18  6:44 PM  Result Value Ref Range Status   Specimen Description EXPECTORATED SPUTUM  Final   Special Requests NONE Reflexed from [id number]  Final   Gram Stain   Final    ABUNDANT WBC PRESENT, PREDOMINANTLY PMN FEW GRAM POSITIVE RODS FEW YEAST Performed at [facility], [place] [address] [place], [place] [address]    Culture FEW CANDIDA ALBICANS  Final   Report Status 06/14/2018 FINAL  Final  Culture, blood (single)     Status: None (Preliminary result)   Collection Time: 06/15/18  5:40 AM  Result Value Ref Range Status   Specimen Description BLOOD RIGHT HAND  Final   Special Requests   Final    BOTTLES DRAWN AEROBIC ONLY Blood Culture adequate volume   Culture   Final    NO GROWTH 3 DAYS  Performed at [facility], [place] [address], [place], [place] [address]    Report Status PENDING  Incomplete  Acid Fast Smear (AFB)     Status: None   Collection Time: 06/17/18  3:37 PM  Result Value Ref Range Status   AFB Specimen Processing Concentration  Final   Acid Fast Smear Negative  Final    Comment: (NOTE) Performed At: [facility] [place], [place] [id number] Rush Farmer MD [id number]    Source (AFB) SPUTUM  Final    Comment: Performed at [facility], [place] [address], [place], [place] [address]  Expectorated sputum assessment w rflx to resp cult     Status: None   Collection Time: 06/17/18  3:37 PM  Result Value Ref Range Status   Specimen Description EXPECTORATED SPUTUM  Final   Special Requests NONE  Final   Sputum evaluation   Final    THIS SPECIMEN IS ACCEPTABLE FOR SPUTUM CULTURE Performed at [facility], [place] [address] [place], [place] [address]    Report Status 06/17/2018 FINAL  Final  Culture, respiratory     Status: None (Preliminary result)   Collection Time: 06/17/18  3:37 PM  Result Value Ref Range Status   Specimen Description EXPECTORATED SPUTUM  Final   Special Requests NONE Reflexed from [id number]  Final   Gram Stain   Final    FEW WBC PRESENT,BOTH PMN AND MONONUCLEAR NO ORGANISMS SEEN    Culture   Final    CULTURE REINCUBATED FOR BETTER GROWTH Performed at [facility], [place] [address] [place],  [address]    Report Status PENDING  Incomplete  Culture, bal-quantitative     Status: None (Preliminary result)   Collection Time: 06/18/18  4:40 PM  Result Value Ref Range Status   Specimen Description BRONCHIAL ALVEOLAR LAVAGE LLL  Final   Special Requests NONE  Final   Gram Stain   Final    FEW WBC PRESENT, PREDOMINANTLY PMN NO ORGANISMS SEEN Performed at [facility], [address], [place], [place] [address]    Culture PENDING  Incomplete   Report Status PENDING  Incomplete     Kathi Ludwig, [place] [facility] for Infectious [facility] [phone] pager   [phone] cell 06/19/2018, 8:40 AM

## 2018-06-20 LAB — CULTURE, BLOOD (SINGLE)
Culture: NO GROWTH
Special Requests: ADEQUATE

## 2018-06-20 NOTE — Progress Notes (Signed)
[facility] Progress Note  Patient: Julia Johns GUY:[id number]   PCP: Patient, No Pcp Per DOB: [birth year]   DOA: 06/09/2018   DOS: 06/20/2018   Date of Service: the patient was seen and examined on 06/20/2018  Brief hospital course: Pt. with no significant PMH; admitted on 06/09/2018, presented with complaint of cough and fever, was found to have CAP. Currently further plan is continue IV Antibiotics.  Subjective:    tmax 99.9, on 2liter o2 1.1 liter urine output last 24hrs, She continue to have  bilateral lower extremity edema, now she has compression stocking on, but she does not elevate her leg No fever No nausea no vomiting.     Assessment and Plan: Acute hypoxemic respiratory failure Left lower lobe rounded density, in the setting of fever cough sputum production, scant hemoptysis, leukocytosis community-acquired pneumonia Acute diastolic heart failure Paraspinal fluid collection Neurosurgery recommends no intervention for now.  Neurology feels that the patient does not require any intervention since it is not affecting spine. CT surgery recommends no intervention for now.  QuantiFERON-TB gold is indeterminant. Sputum cultures as well as AFB smear negative , per infectious disease  ESR 124, CRP 38.1 ANCA titers  c-ANCA1:80, p ANCA negative, GBM titers negative HIV antibody negative. MRSA PCR negative COVID x2 negative in the other facility Provide Tussionex and other cough suppressant. Underwent Bronchoscopy on 5/28, per prelim result concerns for malignancy, awaiting official pathology report, will follow pulm/ID recommendations We will continue with IV diuresis aggressively.    Bilateral proximal muscle weakness both upper and lower extremity. Patient reports this is ongoing for last 7 days. ESR is elevated. May represent PMR or other inflammatory condition. May also represent association with sepsis. Monitor for now.   Right upper lobe  pulmonary nodule She will need follow-up for the small subcentimeter right upper lobe pulmonary nodule, follow-up for this will be noncontrasted CT 9 to 12 months.  Bilateral lower extremity edema Echo with preserved EF, venous doppler negative for DVT She is on iv lasix Close monitor intake and output  Abdominal pain. Colitis. Nausea and vomiting. Due to her worsening severe abdominal pain as well as nausea and vomiting a CT abdomen pelvis with contrast was performed. Evidence of persistent colitis without any worsening. No other acute abnormality. C. difficile was ordered but not sent as the patient did not have any further diarrhea.  Constipation. Provide stool softener.Hold for now.  Obesity: Body mass index is 32.47 kg/m.   Diet: Cardiac diet DVT Prophylaxis: subcutaneous Heparin  Advance goals of care discussion: Full code  Family Communication: no family was present at bedside, at the time of interview.  Discussed with daughter-in-law on 06/16/2018.  Disposition:  Discharge to be determined . Need to be cleared by pulm and ID  Consultants: Neurology, neurosurgery, pulmonary, cardiothoracic surgery, infectious disease Procedures: none  Scheduled Meds: . benzonatate  100 mg Oral TID  . chlorpheniramine-HYDROcodone  5 mL Oral Q12H  . furosemide  80 mg Intravenous BID  . guaiFENesin  600 mg Oral BID  . methocarbamol  500 mg Oral TID  . midazolam  2 mg Intravenous Once  . senna-docusate  1 tablet Oral BID  . sodium chloride flush  3 mL Intravenous Q12H   Continuous Infusions: . sodium chloride 10 mL/hr at 06/18/18 1838   PRN Meds: acetaminophen **OR** acetaminophen, ipratropium-albuterol, lip balm, menthol-cetylpyridinium, morphine injection, ondansetron **OR** ondansetron (ZOFRAN) IV, oxyCODONE, phenol, simethicone, zolpidem Antibiotics: Anti-infectives (From admission, onward)   Start  Dose/Rate Route Frequency Ordered Stop   06/17/18 2300  vancomycin  (VANCOCIN) 1,250 mg in sodium chloride 0.9 % 250 mL IVPB  Status:  Discontinued     1,250 mg 166.7 mL/hr over 90 Minutes Intravenous Every 12 hours 06/17/18 1027 06/20/18 1019   06/17/18 1100  vancomycin (VANCOCIN) 2,000 mg in sodium chloride 0.9 % 500 mL IVPB     2,000 mg 250 mL/hr over 120 Minutes Intravenous  Once 06/17/18 1027 06/17/18 2320   06/10/18 1400  meropenem (MERREM) 1 g in sodium chloride 0.9 % 100 mL IVPB  Status:  Discontinued     1 g 200 mL/hr over 30 Minutes Intravenous Every 8 hours 06/10/18 1120 06/20/18 1019       Objective: Physical Exam: Vitals:   06/19/18 1949 06/19/18 2313 06/20/18 0429 06/20/18 0735  BP: 100/83 92/74 (!) 117/50 (!) 102/57  Pulse: 85  99 98  Resp: (!) 21 (!) 23 13 (!) 21  Temp:  99.9 F (37.7 C) 99.3 F (37.4 C) 99.2 F (37.3 C)  TempSrc:  Oral Oral Oral  SpO2: 97% 96% 92% 94%  Weight:   94 kg   Height:        Intake/Output Summary (Last 24 hours) at 06/20/2018 1202 Last data filed at 06/20/2018 0600 Gross per 24 hour  Intake 450 ml  Output 1100 ml  Net -650 ml   Filed Weights   06/18/18 0453 06/19/18 0314 06/20/18 0429  Weight: 94.7 kg 94 kg 94 kg   General: Alert, Awake and Oriented to Time, Place and Person. Appear in mild distress, affect appropriate Eyes: PERRL, Conjunctiva normal ENT: Oral Mucosa clear moist. Neck: no JVD, no Abnormal Mass Or lumps Cardiovascular: S1 and S2 Present, no Murmur, Peripheral Pulses Present Respiratory: normal respiratory effort, Bilateral Air entry equal and Decreased, no use of accessory muscle, Clear to Auscultation, no Crackles, no wheezes Abdomen: Bowel Sound present, Soft and no tenderness, no hernia Skin: no redness, no Rash, no induration Extremities: bilateral lower extremity pitting  edema, no calf tenderness Neurologic: Grossly no focal neuro deficit. Bilaterally Equal motor strength  Data Reviewed: CBC: Recent Labs  Lab 06/14/18 0528 06/15/18 0540 06/16/18 0436 06/17/18  0423 06/19/18 0650  WBC 14.0* 13.9* 14.9* 14.0* 13.8*  NEUTROABS  --   --   --   --  12.2*  HGB 12.0 11.6* 11.3* 10.9* 10.5*  HCT 38.1 35.9* 35.1* 34.0* 32.9*  MCV 89.2 87.6 87.3 87.6 87.0  PLT 501* 507* 554* 514* 347*   Basic Metabolic Panel: Recent Labs  Lab 06/14/18 0528 06/15/18 0540 06/16/18 0436 06/17/18 0423 06/19/18 0650  NA 135 135 135 134* 137  K 4.3 4.1 3.7 3.7 3.4*  CL 98 95* 92* 89* 88*  CO2 27 30 32 33* 35*  GLUCOSE 154* 138* 151* 177* 212*  BUN 12 12 13 17 18   CREATININE 0.53 0.55 0.53 0.63 0.52  CALCIUM 8.5* 8.4* 8.4* 8.2* 8.2*  MG  --   --   --  2.2  --     Liver Function Tests: Recent Labs  Lab 06/17/18 0423 06/19/18 0650  AST 44* 104*  ALT 37 71*  ALKPHOS 130* 206*  BILITOT 0.7 0.6  PROT 6.3* 6.5  ALBUMIN 1.8* 1.6*   No results for input(s): LIPASE, AMYLASE in the last 168 hours. No results for input(s): AMMONIA in the last 168 hours. Coagulation Profile: No results for input(s): INR, PROTIME in the last 168 hours. Cardiac Enzymes: No results for input(s):  CKTOTAL, CKMB, CKMBINDEX, TROPONINI in the last 168 hours. BNP (last 3 results) No results for input(s): PROBNP in the last 8760 hours. CBG: No results for input(s): GLUCAP in the last 168 hours. Studies: No results found.   Time spent: 25 minutes  Author:  Florencia Reasons, MD PhD [facility] 06/20/2018 12:02 PM  To reach On-call, see care teams to locate the attending and reach out to them via [email or web address]. If 7PM-7AM, please contact night-coverage If you still have difficulty reaching the attending provider, please page the [facility] (Director on Call) for [facility] on amion for assistance.

## 2018-06-20 NOTE — Progress Notes (Signed)
Physical Therapy Treatment Patient Details Name: Julia Johns MRN: [id number] DOB: 20-May-1950 Today's Date: 06/20/2018    History of Present Illness This is a 68 year old female who presented as a transfer from [facility] with complaints of shortness of breath and blood-tinged sputum.  Patient underwent CT imaging which revealed a paraspinal soft tissue thickening from T6-T10 as well as a left lower lobe density within the CT which was enlarged from prior imaging concerning for an inflammatory/infectious etiology.  Patient was found to be febrile.  Patient was admitted to [facility] under airborne isolation, COVID negative, pending workup AFB/ quantiferon.  Pulmonary was consulted for recommendations and management of the left lower lobe density.     PT Comments    Pt progressing now standing without RW, ambulating in room with supervision. Limited session due to patients c/o of pain at EKG electrode site after spilling water on her chest, discussed with RN who is changing them out.  Will follow, anticipate that patient may be abel to progress to [place] if family support can be confirmed.     Follow Up Recommendations  SNF(with progress, HHPT)     Equipment Recommendations  (TBD)    Recommendations for Other Services       Precautions / Restrictions Precautions Precautions: Fall Restrictions Weight Bearing Restrictions: No    Mobility  Bed Mobility               General bed mobility comments: in chair at entry  Transfers Overall transfer level: Needs assistance Equipment used: None Transfers: Sit to/from Stand Sit to Stand: Supervision            Ambulation/Gait Ambulation/Gait assistance: Supervision Gait Distance (Feet): 20 Feet Assistive device: None Gait Pattern/deviations: Step-to pattern Gait velocity: decreased   General Gait Details: satting well on 3L, pt limited session due to pain from electrodes    Stairs              Wheelchair Mobility    Modified Rankin (Stroke Patients Only)       Balance Overall balance assessment: Needs assistance   Sitting balance-Leahy Scale: Good       Standing balance-Leahy Scale: Poor                              Cognition Arousal/Alertness: Awake/alert Behavior During Therapy: WFL for tasks assessed/performed Overall Cognitive Status: Within Functional Limits for tasks assessed                                        Exercises      General Comments        Pertinent Vitals/Pain Pain Assessment: Faces Pain Score: 4  Faces Pain Scale: Hurts little more Pain Location: Chest pain  Pain Descriptors / Indicators: Aching Pain Intervention(s): Limited activity within patient's tolerance;Monitored during session    Home Living                      Prior Function            PT Goals (current goals can now be found in the care plan section) Acute Rehab PT Goals Patient Stated Goal: get stronger PT Goal Formulation: With patient Time For Goal Achievement: 07/01/18 Potential to Achieve Goals: Good Progress towards PT goals: Progressing toward goals    Frequency  Min 3X/week      PT Plan      Co-evaluation              AM-PAC PT "6 Clicks" Mobility   Outcome Measure  Help needed turning from your back to your side while in a flat bed without using bedrails?: A Little Help needed moving from lying on your back to sitting on the side of a flat bed without using bedrails?: A Little Help needed moving to and from a bed to a chair (including a wheelchair)?: A Lot Help needed standing up from a chair using your arms (e.g., wheelchair or bedside chair)?: A Lot Help needed to walk in hospital room?: A Lot Help needed climbing 3-5 steps with a railing? : Total 6 Click Score: 13    End of Session Equipment Utilized During Treatment: Gait belt Activity Tolerance: Patient tolerated treatment  well Patient left: in bed Nurse Communication: Mobility status PT Visit Diagnosis: Unsteadiness on feet (R26.81)     Time: 1630-1650 PT Time Calculation (min) (ACUTE ONLY): 20 min  Charges:  $Gait Training: 8-22 mins                     Reinaldo Berber, PT, DPT Acute Rehabilitation Services Pager: [phone] Office: [place] 06/20/2018, 4:59 PM

## 2018-06-20 NOTE — Progress Notes (Signed)
INFECTIOUS DISEASE PROGRESS NOTE  ID: Julia Johns is a 68 y.o. female with  Principal Problem:   Lobar pneumonia ([place]) Active Problems:   Paravertebral mass   Mass of lower lobe of left lung   Colitis   Acute respiratory failure with hypoxia (HCC)   Bilateral lower extremity edema   Obesity (BMI 30.0-34.9)  Subjective: Up in chair with O2 on, comfortable.   Abtx:  Anti-infectives (From admission, onward)   Start     Dose/Rate Route Frequency Ordered Stop   06/17/18 2300  vancomycin (VANCOCIN) 1,250 mg in sodium chloride 0.9 % 250 mL IVPB     1,250 mg 166.7 mL/hr over 90 Minutes Intravenous Every 12 hours 06/17/18 1027     06/17/18 1100  vancomycin (VANCOCIN) 2,000 mg in sodium chloride 0.9 % 500 mL IVPB     2,000 mg 250 mL/hr over 120 Minutes Intravenous  Once 06/17/18 1027 06/17/18 2320   06/10/18 1400  meropenem (MERREM) 1 g in sodium chloride 0.9 % 100 mL IVPB     1 g 200 mL/hr over 30 Minutes Intravenous Every 8 hours 06/10/18 1120        Medications:  Scheduled: . benzonatate  100 mg Oral TID  . chlorpheniramine-HYDROcodone  5 mL Oral Q12H  . furosemide  80 mg Intravenous BID  . guaiFENesin  600 mg Oral BID  . methocarbamol  500 mg Oral TID  . midazolam  2 mg Intravenous Once  . senna-docusate  1 tablet Oral BID  . sodium chloride flush  3 mL Intravenous Q12H    Objective: Vital signs in last 24 hours: Temp:  [98.6 F (37 C)-99.9 F (37.7 C)] 99.2 F (37.3 C) (05/30 0735) Pulse Rate:  [85-99] 98 (05/30 0735) Resp:  [13-28] 21 (05/30 0735) BP: (92-117)/(50-83) 102/57 (05/30 0735) SpO2:  [92 %-97 %] 94 % (05/30 0735) Weight:  [94 kg] 94 kg (05/30 0429)   General appearance: alert and no distress  Lab Results Recent Labs    06/19/18 0650  WBC 13.8*  HGB 10.5*  HCT 32.9*  NA 137  K 3.4*  CL 88*  CO2 35*  BUN 18  CREATININE 0.52   Liver Panel Recent Labs    06/19/18 0650  PROT 6.5  ALBUMIN 1.6*  AST 104*  ALT 71*   ALKPHOS 206*  BILITOT 0.6  BILIDIR 0.1  IBILI 0.5   Sedimentation Rate No results for input(s): ESRSEDRATE in the last 72 hours. C-Reactive Protein No results for input(s): CRP in the last 72 hours.  Microbiology: Recent Results (from the past 240 hour(s))  Acid Fast Smear (AFB)     Status: None   Collection Time: 06/10/18 10:38 AM  Result Value Ref Range Status   AFB Specimen Processing Concentration  Final   Acid Fast Smear Negative  Final    Comment: (NOTE) Performed At: [facility] [place], [place] [id number] Rush Farmer MD [id number]    Source (AFB) SPUTUM  Final    Comment: Performed at [facility], [place] [address] [place], [place] [address]  Expectorated sputum assessment w rflx to resp cult     Status: None   Collection Time: 06/12/18  6:44 PM  Result Value Ref Range Status   Specimen Description EXPECTORATED SPUTUM  Final   Special Requests NONE  Final   Sputum evaluation   Final    THIS SPECIMEN IS ACCEPTABLE FOR SPUTUM CULTURE Performed at [facility], [place]  [address] [place], [place] [address]    Report Status 06/12/2018 FINAL  Final  Acid Fast Smear (AFB)     Status: None   Collection Time: 06/12/18  6:44 PM  Result Value Ref Range Status   AFB Specimen Processing Concentration  Final   Acid Fast Smear Negative  Final    Comment: (NOTE) Performed At: [facility] [place], [place] [id number] Rush Farmer MD [id number]    Source (AFB) SPUTUM  Final    Comment: Performed at [facility], [place] [address] [place], [place] [address]  Culture, respiratory     Status: None   Collection Time: 06/12/18  6:44 PM  Result Value Ref Range Status   Specimen Description EXPECTORATED SPUTUM  Final   Special Requests NONE Reflexed from [id number]  Final   Gram Stain   Final    ABUNDANT WBC PRESENT, PREDOMINANTLY PMN FEW GRAM POSITIVE RODS FEW YEAST Performed at [facility], [place]  [address] [place], [place] [address]    Culture FEW CANDIDA ALBICANS  Final   Report Status 06/14/2018 FINAL  Final  Culture, blood (single)     Status: None (Preliminary result)   Collection Time: 06/15/18  5:40 AM  Result Value Ref Range Status   Specimen Description BLOOD RIGHT HAND  Final   Special Requests   Final    BOTTLES DRAWN AEROBIC ONLY Blood Culture adequate volume   Culture   Final    NO GROWTH 4 DAYS Performed at [facility], [place] [address] [place], [place] [address]    Report Status PENDING  Incomplete  Acid Fast Smear (AFB)     Status: None   Collection Time: 06/17/18  3:37 PM  Result Value Ref Range Status   AFB Specimen Processing Concentration  Final   Acid Fast Smear Negative  Final    Comment: (NOTE) Performed At: [facility] [place], [place] [id number] Rush Farmer MD [id number]    Source (AFB) SPUTUM  Final    Comment: Performed at [facility], [place] [address] [place], [place] [address]  Expectorated sputum assessment w rflx to resp cult     Status: None   Collection Time: 06/17/18  3:37 PM  Result Value Ref Range Status   Specimen Description EXPECTORATED SPUTUM  Final   Special Requests NONE  Final   Sputum evaluation   Final    THIS SPECIMEN IS ACCEPTABLE FOR SPUTUM CULTURE Performed at [facility], [place] [address], [place],  [address]    Report Status 06/17/2018 FINAL  Final  Culture, respiratory     Status: None   Collection Time: 06/17/18  3:37 PM  Result Value Ref Range Status   Specimen Description EXPECTORATED SPUTUM  Final   Special Requests NONE Reflexed from [id number]  Final   Gram Stain   Final    FEW WBC PRESENT,BOTH PMN AND MONONUCLEAR NO ORGANISMS SEEN Performed at [facility], [place] [address], [place],  [address]    Culture FEW CANDIDA ALBICANS  Final   Report Status 06/19/2018 FINAL  Final  Acid Fast Smear (AFB)     Status: None   Collection Time: 06/18/18  4:40 PM   Result Value Ref Range Status   AFB Specimen Processing Concentration  Final   Acid Fast Smear Negative  Final    Comment: (NOTE) Performed At: [facility] [place], [place] [id number] Rush Farmer MD  UJ:[id number]    Source (AFB) BRONCHIAL ALVEOLAR LAVAGE  Corrected    Comment: LLL Performed at [facility], [place] [address] [place], [place] [address] CORRECTED ON 05/28 AT 1908: PREVIOUSLY REPORTED AS BRONCHIAL WASHINGS LLL   Culture, bal-quantitative     Status: None (Preliminary result)   Collection Time: 06/18/18  4:40 PM  Result Value Ref Range Status   Specimen Description BRONCHIAL ALVEOLAR LAVAGE LLL  Final   Special Requests NONE  Final   Gram Stain   Final    FEW WBC PRESENT, PREDOMINANTLY PMN NO ORGANISMS SEEN    Culture   Final    NO GROWTH < 24 HOURS Performed at [facility], [place] [address] [place], [place] [address]    Report Status PENDING  Incomplete    Studies/Results: Dg C-arm 1-60 Min-no Report  Result Date: 06/18/2018 Fluoroscopy was utilized by the requesting physician.  No radiographic interpretation.     Assessment/Plan: LLL mass Pneumonia Suspected lung Cancer  Total days of antibiotics: 11 merrem/4 vanco  Will stop her anbx- she is afebrile, her WBC is stable.  Suspect this is lung cancer after discussing with pathology yesterday however still need final result.  I have not discussed this with patient.  Available as needed.          Bobby Rumpf MD, FACP Infectious Diseases (pager) [phone] [email or web address] 06/20/2018, 9:28 AM  LOS: 11 days

## 2018-06-21 LAB — FUNGAL ANTIBODIES PANEL, ID-BLOOD
Aspergillus flavus: NEGATIVE
Aspergillus fumigatus, IgG: NEGATIVE
Aspergillus niger: NEGATIVE
Blastomyces Abs, Qn, DID: NEGATIVE
Histoplasma Ab, Immunodiffusion: NEGATIVE

## 2018-06-21 LAB — CULTURE, BAL-QUANTITATIVE W GRAM STAIN: Culture: NO GROWTH

## 2018-06-21 MED ORDER — POTASSIUM CHLORIDE CRYS ER 20 MEQ PO TBCR
40.0000 meq | EXTENDED_RELEASE_TABLET | Freq: Once | ORAL | Status: AC
  Administered 2018-06-21: 40 meq via ORAL
  Filled 2018-06-21: qty 2

## 2018-06-21 NOTE — Progress Notes (Signed)
[facility] Progress Note  Patient: Julia Johns MHW:[id number]   PCP: Patient, No Pcp Per DOB: Jun 12, 1950   DOA: 06/09/2018   DOS: 06/21/2018   Date of Service: the patient was seen and examined on 06/21/2018  Brief hospital course: Pt. with no significant PMH; admitted on 06/09/2018, presented with complaint of cough and fever,  Found to habe Paravertebral and lung masses , transferred from [facility].  s/p bronchoscopy,   F/u on final pathology result, will need to Call oncology if pathology confirmed malignancy    Subjective:    tmax 98.3, on 2liter o2, cough appear less frequent  urine output was no documented  last 24hrs, bilateral lower extremity edema has improved some,  she has compression stocking on, but she does not elevate her leg No fever No nausea no vomiting.     Assessment and Plan:  Acute hypoxemic respiratory failure Left lower lobe rounded density, community-acquired pneumonia Right upper lobe pulmonary nodule QuantiFERON-TB gold is indeterminant. Sputum cultures as well as AFB smear negative   ESR 124, CRP 38.1 ANCA titers  c-ANCA1:80, p ANCA negative, GBM titers negative HIV antibody negative. MRSA PCR negative COVID x2 negative in the other facility Provide Tussionex and other cough suppressant.  infectious disease and pulmonology input appreciated ,  Underwent Bronchoscopy on 5/28, per prelim result concerns for malignancy,abx discontinued after bronc initial path reports. awaiting official pathology report.   Paraspinal fluid collection Neurosurgery recommends no intervention for now.  Neurology feels that the patient does not require any intervention since it is not affecting spine. CT surgery recommends no intervention for now.  Bilateral proximal muscle weakness both upper and lower extremity. Patient reports this is ongoing for last 7 days. ESR is elevated. May represent PMR or other inflammatory condition., paraneoplastic syndrome?  Supportive care, PT/OT  Acute diastolic heart failure with bilateral lower extremity edema Echo with preserved EF, venous doppler negative for DVT She is on iv lasix Close monitor intake and output  Abdominal pain. N/v, sigmoid Colitis.  CT abdomen pelvis with contrast on 5/24 with Evidence of sigmoid colitis without perforation or abscess No other acute abnormality. C. difficile was ordered but not sent as the patient did not have any further diarrhea. Abdomen symptom has resolved   Obesity: Body mass index is 32.51 kg/m.   Diet: Cardiac diet DVT Prophylaxis: subcutaneous Heparin  Advance goals of care discussion: Full code  Family Communication: no family was present at bedside, at the time of interview.  Discussed with daughter-in-law on 06/16/2018.  Disposition:  Awaiting formal pathology reports, will likely need oncology consult, then snf placement     Consultants: Neurology, neurosurgery, pulmonary, cardiothoracic surgery, infectious disease Procedures: Bronchoscopy on 5/28  Scheduled Meds: . benzonatate  100 mg Oral TID  . chlorpheniramine-HYDROcodone  5 mL Oral Q12H  . furosemide  80 mg Intravenous BID  . guaiFENesin  600 mg Oral BID  . methocarbamol  500 mg Oral TID  . midazolam  2 mg Intravenous Once  . senna-docusate  1 tablet Oral BID  . sodium chloride flush  3 mL Intravenous Q12H   Continuous Infusions: . sodium chloride 10 mL/hr at 06/18/18 1838   PRN Meds: acetaminophen **OR** acetaminophen, ipratropium-albuterol, lip balm, menthol-cetylpyridinium, morphine injection, ondansetron **OR** ondansetron (ZOFRAN) IV, oxyCODONE, phenol, simethicone, zolpidem Antibiotics: Anti-infectives (From admission, onward)   Start     Dose/Rate Route Frequency Ordered Stop   06/17/18 2300  vancomycin (VANCOCIN) 1,250 mg in sodium chloride 0.9 % 250 mL IVPB  Status:  Discontinued     1,250 mg 166.7 mL/hr over 90 Minutes Intravenous Every 12 hours 06/17/18 1027  06/20/18 1019   06/17/18 1100  vancomycin (VANCOCIN) 2,000 mg in sodium chloride 0.9 % 500 mL IVPB     2,000 mg 250 mL/hr over 120 Minutes Intravenous  Once 06/17/18 1027 06/17/18 2320   06/10/18 1400  meropenem (MERREM) 1 g in sodium chloride 0.9 % 100 mL IVPB  Status:  Discontinued     1 g 200 mL/hr over 30 Minutes Intravenous Every 8 hours 06/10/18 1120 06/20/18 1019       Objective: Physical Exam: Vitals:   06/21/18 0422 06/21/18 0530 06/21/18 0721 06/21/18 1152  BP: 108/64  113/73 (!) 96/51  Pulse: 95  88 91  Resp: (!) _0 Temp: 98.5 F (36.9 C)  98.2 F (36.8 C) 98.1 F (36.7 C)  TempSrc: Oral  Oral Oral  SpO2: 95%  96% 91%  Weight:  94.2 kg    Height:       No intake or output data in the 24 hours ending 06/21/18 1357 Filed Weights   06/19/18 0314 06/20/18 0429 06/21/18 0530  Weight: 94 kg 94 kg 94.2 kg   General: Alert, Awake and Oriented to Time, Place and Person. Appear in mild distress, affect appropriate Eyes: PERRL, Conjunctiva normal ENT: Oral Mucosa clear moist. Neck: no JVD, no Abnormal Mass Or lumps Cardiovascular: S1 and S2 Present, no Murmur, Peripheral Pulses Present Respiratory: normal respiratory effort, Bilateral Air entry equal and Decreased, no use of accessory muscle, Clear to Auscultation, no Crackles, no wheezes Abdomen: Bowel Sound present, Soft and no tenderness, no hernia Skin: no redness, no Rash, no induration Extremities: bilateral lower extremity pitting  edema, no calf tenderness Neurologic: Grossly no focal neuro deficit. Bilaterally Equal motor strength  Data Reviewed: CBC: Recent Labs  Lab 06/15/18 0540 06/16/18 0436 06/17/18 0423 06/19/18 0650  WBC 13.9* 14.9* 14.0* 13.8*  NEUTROABS  --   --   --  12.2*  HGB 11.6* 11.3* 10.9* 10.5*  HCT 35.9* 35.1* 34.0* 32.9*  MCV 87.6 87.3 87.6 87.0  PLT 507* 554* 514* 072*   Basic Metabolic Panel: Recent Labs  Lab 06/15/18 0540 06/16/18 0436 06/17/18 0423 06/19/18 0650   NA 135 135 134* 137  K 4.1 3.7 3.7 3.4*  CL 95* 92* 89* 88*  CO2 30 32 33* 35*  GLUCOSE 138* 151* 177* 212*  BUN _1 CREATININE 0.55 0.53 0.63 0.52  CALCIUM 8.4* 8.4* 8.2* 8.2*  MG  --   --  2.2  --     Liver Function Tests: Recent Labs  Lab 06/17/18 0423 06/19/18 0650  AST 44* 104*  ALT 37 71*  ALKPHOS 130* 206*  BILITOT 0.7 0.6  PROT 6.3* 6.5  ALBUMIN 1.8* 1.6*   No results for input(s): LIPASE, AMYLASE in the last 168 hours. No results for input(s): AMMONIA in the last 168 hours. Coagulation Profile: No results for input(s): INR, PROTIME in the last 168 hours. Cardiac Enzymes: No results for input(s): CKTOTAL, CKMB, CKMBINDEX, TROPONINI in the last 168 hours. BNP (last 3 results) No results for input(s): PROBNP in the last 8760 hours. CBG: No results for input(s): GLUCAP in the last 168 hours. Studies: No results found.   Time spent: 35 minutes  Author:  Florencia Reasons, MD PhD [facility] 06/21/2018 1:57 PM  To reach On-call, see care teams to locate the attending and reach out  to them via [email or web address]. If 7PM-7AM, please contact night-coverage If you still have difficulty reaching the attending provider, please page the [facility] (Director on Call) for [facility] on amion for assistance.

## 2018-06-21 NOTE — TOC Initial Note (Signed)
Transition of Care [facility]) - Initial/Assessment Note    Patient Details  Name: Julia Johns MRN: [id number] Date of Birth: 1950/12/18  Transition of Care [facility]) CM/SW Contact:    Gelene Mink, [place] Phone Number: 06/21/2018, 2:54 PM  Clinical Narrative:                  CSW met with the patient at bedside along with the interpretor. CSW introduced herself and explained her role. CSW informed the patient that the recommendation from the physical therapist was SNF. The patient declined SNF. The patient is from [place] and is visiting her son. She stated that she is staying with her son for now and may move to [place] permanently. She would be agreeable to having home health at the patient's son home. She chose [place]. CSW explained that once the patient was medically ready to discharge that the home health agency would be calling the patient to set up an evaluation. CSW stated that the [facility] would coordinate appropriate equipment to be delivered.   CSW will continue to assist with disposition planning.    Expected Discharge Plan: [place] Barriers to Discharge: Continued Medical Work up   Patient Goals and CMS Choice Patient states their goals for this hospitalization and ongoing recovery are:: Pt would like to go to her son's home and complete rehab   Choice offered to / list presented to : NA  Expected Discharge Plan and Services Expected Discharge Plan: [place] In-house Referral: Clinical Social Work Discharge Planning Services: NA   Living arrangements for the past 2 months: Single Family Home                 DME Arranged: N/A DME Agency: NA         [place] Agency: [place] ([facility])        Prior Living Arrangements/Services Living arrangements for the past 2 months: [place] Lives with:: Adult Children Patient language and need for interpreter reviewed:: Yes Do you feel safe  going back to the place where you live?: Yes      Need for Family Participation in Patient Care: Yes (Comment) Care giver support system in place?: Yes (comment)   Criminal Activity/Legal Involvement Pertinent to Current Situation/Hospitalization: No - Comment as needed  Activities of Daily Living Home Assistive Devices/Equipment: None ADL Screening (condition at time of admission) Patient's cognitive ability adequate to safely complete daily activities?: Yes Is the patient deaf or have difficulty hearing?: No Does the patient have difficulty seeing, even when wearing glasses/contacts?: No Does the patient have difficulty concentrating, remembering, or making decisions?: No Patient able to express need for assistance with ADLs?: Yes Does the patient have difficulty dressing or bathing?: No Independently performs ADLs?: Yes (appropriate for developmental age) Does the patient have difficulty walking or climbing stairs?: No Weakness of Legs: None Weakness of Arms/Hands: None  Permission Sought/Granted Permission sought to share information with : Case Manager Permission granted to share information with : Yes, Verbal Permission Granted  Share Information with NAME: [name]  Permission granted to share info w AGENCY: [place] granted to share info w Relationship: Son     Emotional Assessment Appearance:: Appears stated age Attitude/Demeanor/Rapport: Engaged Affect (typically observed): Calm Orientation: : Oriented to Self, Oriented to  Time, Oriented to Place, Oriented to Situation Alcohol / Substance Use: Not Applicable Psych Involvement: No (comment)  Admission diagnosis:  RESPIRATORY DIFFICULTY CHEST PAIN Patient Active Problem List   Diagnosis Date Noted  . Acute respiratory failure with hypoxia ([place])   . Bilateral lower extremity edema   . Obesity (BMI 30.0-34.9)   . Colitis 06/15/2018  . Lobar pneumonia ([place]) 06/10/2018  . Paravertebral  mass 06/09/2018  . Mass of lower lobe of left lung 06/09/2018   PCP:  Patient, No Pcp Per Pharmacy:   [facility] [address] [place] [address] [place] [address] Phone: [phone] Fax: [place], [place] - [address] [place] [address] Phone: [phone] Fax: [phone]     Social Determinants of Health (SDOH) Interventions    Readmission Risk Interventions No flowsheet data found.

## 2018-06-22 LAB — CBC WITH DIFFERENTIAL/PLATELET
Abs Immature Granulocytes: 0.18 10*3/uL — ABNORMAL HIGH (ref 0.00–0.07)
Basophils Absolute: 0 10*3/uL (ref 0.0–0.1)
Basophils Relative: 0 %
Eosinophils Absolute: 0.2 10*3/uL (ref 0.0–0.5)
Eosinophils Relative: 1 %
HCT: 33.7 % — ABNORMAL LOW (ref 36.0–46.0)
Hemoglobin: 10.7 g/dL — ABNORMAL LOW (ref 12.0–15.0)
Immature Granulocytes: 1 %
Lymphocytes Relative: 6 %
Lymphs Abs: 1.1 10*3/uL (ref 0.7–4.0)
MCH: 27.6 pg (ref 26.0–34.0)
MCHC: 31.8 g/dL (ref 30.0–36.0)
MCV: 86.9 fL (ref 80.0–100.0)
Monocytes Absolute: 0.4 10*3/uL (ref 0.1–1.0)
Monocytes Relative: 2 %
Neutro Abs: 17.1 10*3/uL — ABNORMAL HIGH (ref 1.7–7.7)
Neutrophils Relative %: 90 %
Platelets: 683 10*3/uL — ABNORMAL HIGH (ref 150–400)
RBC: 3.88 MIL/uL (ref 3.87–5.11)
RDW: 14.2 % (ref 11.5–15.5)
WBC: 19.1 10*3/uL — ABNORMAL HIGH (ref 4.0–10.5)
nRBC: 0 % (ref 0.0–0.2)

## 2018-06-22 LAB — COMPREHENSIVE METABOLIC PANEL
ALT: 119 U/L — ABNORMAL HIGH (ref 0–44)
AST: 101 U/L — ABNORMAL HIGH (ref 15–41)
Albumin: 1.7 g/dL — ABNORMAL LOW (ref 3.5–5.0)
Alkaline Phosphatase: 255 U/L — ABNORMAL HIGH (ref 38–126)
Anion gap: 13 (ref 5–15)
BUN: 23 mg/dL (ref 8–23)
CO2: 35 mmol/L — ABNORMAL HIGH (ref 22–32)
Calcium: 8.3 mg/dL — ABNORMAL LOW (ref 8.9–10.3)
Chloride: 83 mmol/L — ABNORMAL LOW (ref 98–111)
Creatinine, Ser: 0.8 mg/dL (ref 0.44–1.00)
GFR calc Af Amer: >60 mL/min (ref >60)
GFR calc non Af Amer: >60 mL/min (ref >60)
Glucose, Bld: 202 mg/dL — ABNORMAL HIGH (ref 70–99)
Potassium: 3.8 mmol/L (ref 3.5–5.1)
Sodium: 131 mmol/L — ABNORMAL LOW (ref 135–145)
Total Bilirubin: 0.8 mg/dL (ref 0.3–1.2)
Total Protein: 7.1 g/dL (ref 6.5–8.1)

## 2018-06-22 NOTE — Progress Notes (Signed)
PROGRESS NOTE    Julia Johns  VZD:[id number] DOB: [birth year] DOA: 06/09/2018 PCP: Patient, No Pcp Per   Brief Narrative:  HPI on 06/09/2018 by Dr. Karmen Bongo Julia Johns is a 68 y.o. female without significant medical history presenting with SOB, blood-tinged sputum, CP as a transfer from [facility].   She saw her PCP on last Monday.  She presented to [facility] 4 days ago for these symptoms and evaluation including MRI of the T-spine showed a paraspinal soft tissue thickening T6-10 concerning for malignancy.  Teleneurology was consulted and the patient was recommended to be admitted with plan for consults at that time with ortho, neurosurgery, and ID; MRI spine; PET scan.  The patient declined to stay and requested outpatient f/u.  Now with worsening SOB, does not feel like she can manage this outpatient and so she returned to [facility].  Vitals 89% on RA, improved to 93% on 2L [place] O2.  PE unremarkable at [facility].  Labs unremarkable other than CRP 180 (normal <10 on their scale).  Pre-BNP 142 -> 981.  Negative troponin, stable EKG.  CTA negative for PE but "prevertebral densities" - malignancy vs. infection - and LLL density that is 4.5 x 2.9 cm concerning for mass with surrounding infiltrate.  Hospitalist at [facility] was concerned about blood-tinged sputum and recommended transfer to [facility].  Repeat COVID test negative (initial was also negative).  The patient reports significant pain in the left thoracic back region and midsternal chest tightness.  She has SOB and cough with hemoptysis.  No apparent fever.  No tobacco history.  Interim history Admitted with hypoxic resp failure. Found to have paravertebral and lung masses.  Patient was transferred from [facility].  She has had bronchoscopy.  Currently pending pathology results. Assessment & Plan   Acute hypoxemic respiratory failure/left lower lobe rounded density, community-acquired pneumonia, right upper lobe pulmonary nodule -COVID was negative at  the outside facility x2 -QuantiFERON-TB gold is indeterminate -Sputum cultures as well as AFB smear negative -Pulmonology and infectious disease consulted and appreciated -ESR 124, CRP 38.1 -ANCA titers  c-ANCA1:80, p ANCA negative, GBM titers negative -HIV antibody negative -MRSA PCR negative -Status post bronchoscopy on 06/18/2018, preliminary results are concerning for malignancy -Continue antitussives -Antibiotics (vancomycin, merrem) were discontinued after initial bronc pathology reports -Pending official pathology report  Paraspinal fluid collection -Neurosurgery, neurology, CT surgery, consulted and appreciated, currently no intervention  Bilateral proximal muscle weakness -Upper and lower extremities -Has been ongoing for the last several days -Above ESR is elevated -?  PMR or other inflammatory condition, paraneoplastic syndrome -Supportive care -Physical therapy SNF- with progress HHPT  Acute diastolic heart failure -Patient with bilateral lower extremity edema -Echocardiogram EF 29-51%, LV diastolic parameters normal, no RWMA -Extremity Dopplers negative for DVT -Currently on IV Lasix -Continue to monitor intake and output, weights  Abdominal pain, with nausea and vomiting, sigmoid colitis -CT abdomen pelvis on 06/14/2018: evidence of sigmoid colitis without perforation or abscess -C. difficile was ordered but not sent as patient was not having any further diarrhea -Abdominal symptoms seem to have resolved  Obesity -BMI 32.51 -Patient to follow-up with PCP regarding lifestyle modifications  DVT Prophylaxis  SCDs  Code Status: Full  Family Communication: None at bedside  Disposition Plan: Admitted. Pending improvement in respiratory status and pathology reports. Dispo TBD  Consultants Infectious disease PCCM Cardiothoracic surgery Neurology Neurosurgery  Procedures  Echocardiogram LE doppler  Antibiotics   Anti-infectives (From admission, onward)    Start  Dose/Rate Route Frequency Ordered Stop   06/17/18 2300  vancomycin (VANCOCIN) 1,250 mg in sodium chloride 0.9 % 250 mL IVPB  Status:  Discontinued     1,250 mg 166.7 mL/hr over 90 Minutes Intravenous Every 12 hours 06/17/18 1027 06/20/18 1019   06/17/18 1100  vancomycin (VANCOCIN) 2,000 mg in sodium chloride 0.9 % 500 mL IVPB     2,000 mg 250 mL/hr over 120 Minutes Intravenous  Once 06/17/18 1027 06/17/18 2320   06/10/18 1400  meropenem (MERREM) 1 g in sodium chloride 0.9 % 100 mL IVPB  Status:  Discontinued     1 g 200 mL/hr over 30 Minutes Intravenous Every 8 hours 06/10/18 1120 06/20/18 1019      Subjective:   Julia Johns seen and examined today.  Feels breathing is about the same. Feels leg swelling is improving. No chest pain, abdominal pain, N/V/D/C, dizziness, headache.   Objective:   Vitals:   06/21/18 2333 06/22/18 0300 06/22/18 0655 06/22/18 0757  BP: (!) 95/58 103/71  114/68  Pulse: 88 100  96  Resp: (!) 22 17  (!) 21  Temp: 99.2 F (37.3 C) 98.4 F (36.9 C)  99 F (37.2 C)  TempSrc: Oral Oral  Oral  SpO2: 96% 93%    Weight:   95 kg   Height:       No intake or output data in the 24 hours ending 06/22/18 1012 Filed Weights   06/20/18 0429 06/21/18 0530 06/22/18 0655  Weight: 94 kg 94.2 kg 95 kg    Exam  General: Well developed, well nourished, NAD, appears stated age  61: NCAT, mucous membranes moist.   Neck: Supple  Cardiovascular: S1 S2 auscultated, RRR, no murmur  Respiratory: Diminished but clear  Abdomen: Soft, obese, nontender, nondistended, + bowel sounds  Extremities: warm dry without cyanosis clubbing, LE edema. Compression stockings on   Neuro: AAOx3, nonfocal  Psych: Normal affect and demeanor, pleasant    Data Reviewed: I have personally reviewed following labs and imaging studies  CBC: Recent Labs  Lab 06/16/18 0436 06/17/18 0423 06/19/18 0650 06/22/18 0648  WBC 14.9* 14.0* 13.8* 19.1*  NEUTROABS   --   --  12.2* 17.1*  HGB 11.3* 10.9* 10.5* 10.7*  HCT 35.1* 34.0* 32.9* 33.7*  MCV 87.3 87.6 87.0 86.9  PLT 554* 514* 633* 094*   Basic Metabolic Panel: Recent Labs  Lab 06/16/18 0436 06/17/18 0423 06/19/18 0650 06/22/18 0648  NA 135 134* 137 131*  K 3.7 3.7 3.4* 3.8  CL 92* 89* 88* 83*  CO2 32 33* 35* 35*  GLUCOSE 151* 177* 212* 202*  BUN _0 CREATININE 0.53 0.63 0.52 0.80  CALCIUM 8.4* 8.2* 8.2* 8.3*  MG  --  2.2  --   --    GFR: Estimated Creatinine Clearance: 79.7 mL/min (by C-G formula based on SCr of 0.8 mg/dL). Liver Function Tests: Recent Labs  Lab 06/17/18 0423 06/19/18 0650 06/22/18 0648  AST 44* 104* 101*  ALT 37 71* 119*  ALKPHOS 130* 206* 255*  BILITOT 0.7 0.6 0.8  PROT 6.3* 6.5 7.1  ALBUMIN 1.8* 1.6* 1.7*   No results for input(s): LIPASE, AMYLASE in the last 168 hours. No results for input(s): AMMONIA in the last 168 hours. Coagulation Profile: No results for input(s): INR, PROTIME in the last 168 hours. Cardiac Enzymes: No results for input(s): CKTOTAL, CKMB, CKMBINDEX, TROPONINI in the last 168 hours. BNP (last 3 results) No results for input(s): PROBNP  in the last 8760 hours. HbA1C: No results for input(s): HGBA1C in the last 72 hours. CBG: No results for input(s): GLUCAP in the last 168 hours. Lipid Profile: No results for input(s): CHOL, HDL, LDLCALC, TRIG, CHOLHDL, LDLDIRECT in the last 72 hours. Thyroid Function Tests: No results for input(s): TSH, T4TOTAL, FREET4, T3FREE, THYROIDAB in the last 72 hours. Anemia Panel: No results for input(s): VITAMINB12, FOLATE, FERRITIN, TIBC, IRON, RETICCTPCT in the last 72 hours. Urine analysis: No results found for: COLORURINE, APPEARANCEUR, [place], [place], GLUCOSEU, HGBUR, BILIRUBINUR, KETONESUR, PROTEINUR, UROBILINOGEN, NITRITE, LEUKOCYTESUR Sepsis Labs: _0 (procalcitonin:4,lacticidven:4)  ) Recent Results (from the past 240 hour(s))  Expectorated sputum assessment w rflx  to resp cult     Status: None   Collection Time: 06/12/18  6:44 PM  Result Value Ref Range Status   Specimen Description EXPECTORATED SPUTUM  Final   Special Requests NONE  Final   Sputum evaluation   Final    THIS SPECIMEN IS ACCEPTABLE FOR SPUTUM CULTURE Performed at [facility], [address] [place], [place] [address]    Report Status 06/12/2018 FINAL  Final  Acid Fast Smear (AFB)     Status: None   Collection Time: 06/12/18  6:44 PM  Result Value Ref Range Status   AFB Specimen Processing Concentration  Final   Acid Fast Smear Negative  Final    Comment: (NOTE) Performed At: [facility] [place], [place] [id number] Rush Farmer MD FF:[id number]    Source (AFB) SPUTUM  Final    Comment: Performed at [facility], [place] [address], [place], [place] [address]  Culture, respiratory     Status: None   Collection Time: 06/12/18  6:44 PM  Result Value Ref Range Status   Specimen Description EXPECTORATED SPUTUM  Final   Special Requests NONE Reflexed from [id number]  Final   Gram Stain   Final    ABUNDANT WBC PRESENT, PREDOMINANTLY PMN FEW GRAM POSITIVE RODS FEW YEAST Performed at [facility], [place] [address], [place], [place] [address]    Culture FEW CANDIDA ALBICANS  Final   Report Status 06/14/2018 FINAL  Final  Culture, blood (single)     Status: None   Collection Time: 06/15/18  5:40 AM  Result Value Ref Range Status   Specimen Description BLOOD RIGHT HAND  Final   Special Requests   Final    BOTTLES DRAWN AEROBIC ONLY Blood Culture adequate volume   Culture   Final    NO GROWTH 5 DAYS Performed at [facility], [place] [address], [place], [place] [address]    Report Status 06/20/2018 FINAL  Final  Acid Fast Smear (AFB)     Status: None   Collection Time: 06/17/18  3:37 PM  Result Value Ref Range Status   AFB Specimen Processing Concentration  Final   Acid Fast Smear Negative  Final    Comment: (NOTE) Performed At: [facility] [place], [place] [id number] Rush Farmer MD [id number]    Source (AFB) SPUTUM  Final    Comment: Performed at [facility], [place] [address] [place], [place] [address]  Expectorated sputum assessment w rflx to resp cult     Status: None   Collection Time: 06/17/18  3:37 PM  Result Value Ref Range Status   Specimen Description EXPECTORATED SPUTUM  Final   Special Requests NONE  Final   Sputum evaluation   Final    THIS SPECIMEN IS  ACCEPTABLE FOR SPUTUM CULTURE Performed at [facility], [place] [address], [place], [place] [address]    Report Status 06/17/2018 FINAL  Final  Culture, respiratory     Status: None   Collection Time: 06/17/18  3:37 PM  Result Value Ref Range Status   Specimen Description EXPECTORATED SPUTUM  Final   Special Requests NONE Reflexed from [id number]  Final   Gram Stain   Final    FEW WBC PRESENT,BOTH PMN AND MONONUCLEAR NO ORGANISMS SEEN Performed at [facility], [place] [address], [place], [place] [address]    Culture FEW CANDIDA ALBICANS  Final   Report Status 06/19/2018 FINAL  Final  Acid Fast Smear (AFB)     Status: None   Collection Time: 06/18/18  4:40 PM  Result Value Ref Range Status   AFB Specimen Processing Concentration  Final   Acid Fast Smear Negative  Final    Comment: (NOTE) Performed At: [facility] [place], [place] [id number] Rush Farmer MD FB:[id number]    Source (AFB) BRONCHIAL ALVEOLAR LAVAGE  Corrected    Comment: LLL Performed at [facility], [place] [name] [place], [place] [address] CORRECTED ON 05/28 AT 1908: PREVIOUSLY REPORTED AS BRONCHIAL WASHINGS LLL   Culture, bal-quantitative     Status: None   Collection Time: 06/18/18  4:40 PM  Result Value Ref Range Status   Specimen Description BRONCHIAL ALVEOLAR LAVAGE LLL  Final   Special Requests NONE  Final   Gram Stain   Final    FEW WBC PRESENT, PREDOMINANTLY PMN NO ORGANISMS SEEN     Culture   Final    NO GROWTH Performed at [facility], [place] [address] [place], [place] [address]    Report Status 06/21/2018 FINAL  Final      Radiology Studies: No results found.   Scheduled Meds: . benzonatate  100 mg Oral TID  . chlorpheniramine-HYDROcodone  5 mL Oral Q12H  . furosemide  80 mg Intravenous BID  . guaiFENesin  600 mg Oral BID  . methocarbamol  500 mg Oral TID  . midazolam  2 mg Intravenous Once  . senna-docusate  1 tablet Oral BID  . sodium chloride flush  3 mL Intravenous Q12H   Continuous Infusions:   LOS: 13 days   Time Spent in minutes   30 minutes  Zeki Bedrosian D.O. on 06/22/2018 at 10:12 AM  Between 7am to 7pm - Please see pager noted on [email or web address]  After 7pm go to [email or web address]  And look for the night coverage person covering for me after hours  [facility] Office  [phone]

## 2018-06-22 NOTE — Progress Notes (Signed)
Physical Therapy Note  (full treatment note to follow)  SATURATION QUALIFICATIONS: (This note is used to comply with regulatory documentation for home oxygen)  Patient Saturations on Room Air at Rest = 91%  Patient Saturations on Room Air while Ambulating = 86%  Patient Saturations on 2 Liters of oxygen while Ambulating = 93%  Please briefly explain why patient needs home oxygen: Patient requires supplemental oxygen to maintain oxygen saturations at acceptable, safe levels with physical activity.  Roney Marion, [place]  Acute Rehabilitation Services Pager [phone] Office [phone]

## 2018-06-22 NOTE — Care Management Important Message (Signed)
Important Message  Patient Details  Name: Rebbeca Sheperd MRN: [id number] Date of Birth: 1950/09/11   Medicare Important Message Given:  Yes    Hildur Bayer 06/22/2018, 4:01 PM

## 2018-06-22 NOTE — Progress Notes (Signed)
Physical Therapy Treatment Patient Details Name: Julia Johns MRN: [id number] DOB: January 23, 1950 Today's Date: 06/22/2018    History of Present Illness This is a 68 year old female who presented as a transfer from [facility] with complaints of shortness of breath and blood-tinged sputum.  Patient underwent CT imaging which revealed a paraspinal soft tissue thickening from T6-T10 as well as a left lower lobe density within the CT which was enlarged from prior imaging concerning for an inflammatory/infectious etiology.  Patient was found to be febrile.  Patient was admitted to [facility] under airborne isolation, COVID negative, pending workup AFB/ quantiferon.  Pulmonary was consulted for recommendations and management of the left lower lobe density.     PT Comments    Continuing work on functional mobility and activity tolerance;  Pt very much wanting to dc home, and she is making the improvements to be able to; Currently Patient requires supplemental oxygen to maintain oxygen saturations at acceptable, safe levels with physical activity.   Follow Up Recommendations  Home health PT     Equipment Recommendations  Rolling walker with 5" wheels;3in1 (PT);Other (comment)(youth-sized RW)    Recommendations for Other Services       Precautions / Restrictions Precautions Precautions: Fall Precaution Comments: Fall risk low and greatly reduced with UE support while ambulatiign Restrictions Weight Bearing Restrictions: No    Mobility  Bed Mobility               General bed mobility comments: in chair at entry  Transfers Overall transfer level: Needs assistance Equipment used: None Transfers: Sit to/from Stand Sit to Stand: Supervision         General transfer comment: No gross difficuly with sit to stand  Ambulation/Gait Ambulation/Gait assistance: Min guard(wit and without physical contact) Gait Distance (Feet): 150 Feet Assistive device: (pushing  Dinamap) Gait Pattern/deviations: Step-through pattern;Decreased step length - right;Decreased step length - left Gait velocity: decreased   General Gait Details: Very slow cadence; tired, and at times closing her eyes -- still pt wanting to walk the circle, and she did it   Chief Strategy Officer    Modified Rankin (Stroke Patients Only)       Balance     Sitting balance-Leahy Scale: Good       Standing balance-Leahy Scale: Fair                              Cognition Arousal/Alertness: Awake/alert Behavior During Therapy: WFL for tasks assessed/performed Overall Cognitive Status: Within Functional Limits for tasks assessed                                        Exercises      General Comments General comments (skin integrity, edema, etc.): See other PT note of this date      Pertinent Vitals/Pain Pain Assessment: 0-10 Pain Score: 2  Faces Pain Scale: Hurts a little bit Pain Location: L upper abdominal pain Pain Descriptors / Indicators: Aching Pain Intervention(s): Monitored during session    Home Living                      Prior Function            PT Goals (current goals can now be found in  the care plan section) Acute Rehab PT Goals Patient Stated Goal: get stronger PT Goal Formulation: With patient Time For Goal Achievement: 07/06/18 Potential to Achieve Goals: Good Progress towards PT goals: Goals met and updated - see care plan    Frequency    Min 3X/week      PT Plan Discharge plan needs to be updated    Co-evaluation              AM-PAC PT "6 Clicks" Mobility   Outcome Measure  Help needed turning from your back to your side while in a flat bed without using bedrails?: None Help needed moving from lying on your back to sitting on the side of a flat bed without using bedrails?: None Help needed moving to and from a bed to a chair (including a wheelchair)?: A  Little Help needed standing up from a chair using your arms (e.g., wheelchair or bedside chair)?: None Help needed to walk in hospital room?: A Little Help needed climbing 3-5 steps with a railing? : A Lot 6 Click Score: 20    End of Session Equipment Utilized During Treatment: Gait belt Activity Tolerance: Patient tolerated treatment well Patient left: in chair;with call bell/phone within reach Nurse Communication: Mobility status PT Visit Diagnosis: Unsteadiness on feet (R26.81)     Time: 1595-3967 PT Time Calculation (min) (ACUTE ONLY): 27 min  Charges:  $Gait Training: 23-37 mins                     Roney Marion, [place]  Acute Rehabilitation Services Pager [phone] Office [place] 06/22/2018, 4:04 PM

## 2018-06-23 LAB — CBC
HCT: 34.1 % — ABNORMAL LOW (ref 36.0–46.0)
Hemoglobin: 10.9 g/dL — ABNORMAL LOW (ref 12.0–15.0)
MCH: 27.7 pg (ref 26.0–34.0)
MCHC: 32 g/dL (ref 30.0–36.0)
MCV: 86.8 fL (ref 80.0–100.0)
Platelets: 639 10*3/uL — ABNORMAL HIGH (ref 150–400)
RBC: 3.93 MIL/uL (ref 3.87–5.11)
RDW: 13.9 % (ref 11.5–15.5)
WBC: 19.1 10*3/uL — ABNORMAL HIGH (ref 4.0–10.5)
nRBC: 0 % (ref 0.0–0.2)

## 2018-06-23 LAB — BASIC METABOLIC PANEL
Anion gap: 14 (ref 5–15)
BUN: 29 mg/dL — ABNORMAL HIGH (ref 8–23)
CO2: 35 mmol/L — ABNORMAL HIGH (ref 22–32)
Calcium: 8.3 mg/dL — ABNORMAL LOW (ref 8.9–10.3)
Chloride: 83 mmol/L — ABNORMAL LOW (ref 98–111)
Creatinine, Ser: 0.83 mg/dL (ref 0.44–1.00)
GFR calc Af Amer: >60 mL/min (ref >60)
GFR calc non Af Amer: >60 mL/min (ref >60)
Glucose, Bld: 175 mg/dL — ABNORMAL HIGH (ref 70–99)
Potassium: 3.7 mmol/L (ref 3.5–5.1)
Sodium: 132 mmol/L — ABNORMAL LOW (ref 135–145)

## 2018-06-23 MED ORDER — SODIUM CHLORIDE 0.9 % IV SOLN: INTRAVENOUS | Status: DC

## 2018-06-23 MED ORDER — BISACODYL 10 MG RE SUPP: 10.0000 mg | Freq: Every day | RECTAL | Status: DC | PRN

## 2018-06-23 MED ORDER — HYDROCOD POLST-CPM POLST ER 10-8 MG/5ML PO SUER: 5.0000 mL | Freq: Two times a day (BID) | ORAL | Status: DC | PRN

## 2018-06-23 NOTE — Progress Notes (Signed)
Received report from Shakiera, RN. Patient was resting comfortably in room when I arrived. Patient reported head and chest pain, I was told the patient had trouble sleeping the past few days. A full assessment revealed that the patient had bilateral blisters on the anterior aspect of both feet. She reported she did not know how long she had them. Both blisters were oozing serous drainage, she had not noticed this either. Patient had bilateral expiratory wheezing. Patient has what seems to be a pressure wound on her right inner thigh. Patient experienced a nose bleed around 0930. Patient was able to ambulate to the bedside commode. All patient needs were met throughout the day.  Note created by Holly Robb, Nurse Extern 

## 2018-06-23 NOTE — Progress Notes (Addendum)
PROGRESS NOTE    Julia Johns  DUK:[id number] DOB: Mar 08, 1950 DOA: 06/09/2018 PCP: Patient, No Pcp Per   Brief Narrative:  HPI on 06/09/2018 by Dr. Karmen Bongo Brooks Stotz is a 68 y.o. female without significant medical history presenting with SOB, blood-tinged sputum, CP as a transfer from [facility].   She saw her PCP on last Monday.  She presented to [facility] 4 days ago for these symptoms and evaluation including MRI of the T-spine showed a paraspinal soft tissue thickening T6-10 concerning for malignancy.  Teleneurology was consulted and the patient was recommended to be admitted with plan for consults at that time with ortho, neurosurgery, and ID; MRI spine; PET scan.  The patient declined to stay and requested outpatient f/u.  Now with worsening SOB, does not feel like she can manage this outpatient and so she returned to [facility].  Vitals 89% on RA, improved to 93% on 2L [place] O2.  PE unremarkable at [facility].  Labs unremarkable other than CRP 180 (normal <10 on their scale).  Pre-BNP 142 -> 981.  Negative troponin, stable EKG.  CTA negative for PE but "prevertebral densities" - malignancy vs. infection - and LLL density that is 4.5 x 2.9 cm concerning for mass with surrounding infiltrate.  Hospitalist at [facility] was concerned about blood-tinged sputum and recommended transfer to [facility].  Repeat COVID test negative (initial was also negative).  The patient reports significant pain in the left thoracic back region and midsternal chest tightness.  She has SOB and cough with hemoptysis.  No apparent fever.  No tobacco history.  Interim history Admitted with hypoxic resp failure. Found to have paravertebral and lung masses.  Patient was transferred from [facility].  She has had bronchoscopy.  Currently pending pathology results. Assessment & Plan   Acute hypoxemic respiratory failure/left lower lobe rounded density, community-acquired pneumonia, right upper lobe pulmonary nodule -COVID was negative at  the outside facility x2 -QuantiFERON-TB gold is indeterminate -Sputum cultures as well as AFB smear negative -Pulmonology and infectious disease consulted and appreciated -ESR 124, CRP 38.1 -ANCA titers  c-ANCA1:80, p ANCA negative, GBM titers negative -HIV antibody negative -MRSA PCR negative -Status post bronchoscopy on 06/18/2018, preliminary results are concerning for malignancy -Continue antitussives -Antibiotics (vancomycin, merrem) were discontinued after initial bronc pathology reports -Pending official pathology report -will add on tussionex as patient continues to complain of painful cough -oxygen saturations at rest 91% on room air, with ambulation 86%. Patient will need home oxygen.  Addendum: bronchial washing showed atypical cells. "Scattered atypical cells are present. Together with the changes seen on this patient's concurrent lung biopsy, the findings are not diagnostic of malignancy in my opinion."   Surgical pathology of bronchial biopsy- negative for carcinoma  Paraspinal fluid collection -Neurosurgery, neurology, CT surgery, consulted and appreciated, currently no intervention  Bilateral proximal muscle weakness -Upper and lower extremities -Has been ongoing for the last several days -Above ESR is elevated -?  PMR or other inflammatory condition, paraneoplastic syndrome -Supportive care -Physical therapy now recommending HHPT  Acute diastolic heart failure/ Lower extremity edema -Echocardiogram EF 75-43%, LV diastolic parameters normal, no RWMA -Extremity Dopplers negative for DVT -Currently on IV Lasix -Continue to monitor intake and output, weights -Will consult would care- TED hose removed today, small blistering noted -patient cannot elevated her legs due to increased pressure and feels she cannot breathe  Abdominal pain, with nausea and vomiting, sigmoid colitis -CT abdomen pelvis on 06/14/2018: evidence of sigmoid colitis without perforation or abscess  -  C. difficile was ordered but not sent as patient was not having any further diarrhea -Abdominal symptoms seem to have resolved  Obesity -BMI 32.51 -Patient to follow-up with PCP regarding lifestyle modifications  Generalized weakness -PT recommending HH - rolling walker, 3in 1  Constipation -suppository ordered  DVT Prophylaxis  SCDs  Code Status: Full  Family Communication: None at bedside  Disposition Plan: Admitted. Pending improvement in respiratory status and pathology reports. Dispo home with home health.  Consultants Infectious disease PCCM Cardiothoracic surgery Neurology Neurosurgery  Procedures  Echocardiogram LE doppler  Antibiotics   Anti-infectives (From admission, onward)   Start     Dose/Rate Route Frequency Ordered Stop   06/17/18 2300  vancomycin (VANCOCIN) 1,250 mg in sodium chloride 0.9 % 250 mL IVPB  Status:  Discontinued     1,250 mg 166.7 mL/hr over 90 Minutes Intravenous Every 12 hours 06/17/18 1027 06/20/18 1019   06/17/18 1100  vancomycin (VANCOCIN) 2,000 mg in sodium chloride 0.9 % 500 mL IVPB     2,000 mg 250 mL/hr over 120 Minutes Intravenous  Once 06/17/18 1027 06/17/18 2320   06/10/18 1400  meropenem (MERREM) 1 g in sodium chloride 0.9 % 100 mL IVPB  Status:  Discontinued     1 g 200 mL/hr over 30 Minutes Intravenous Every 8 hours 06/10/18 1120 06/20/18 1019      Subjective:   [name] seen and examined today.  Interpreter used. Complains of continued cough and feels a pain her abdomen when she coughs. Continues to have leg swelling with blistering now. Feels lightheaded today and feels it is due to medications. Denies current nausea, vomiting. Complains of constipation.   Objective:   Vitals:   06/22/18 2120 06/22/18 2357 06/23/18 0655 06/23/18 0722  BP: (!) 106/55 90/72 (!) 91/56 (!) 105/56  Pulse: (!) 102 81 90 93  Resp:   20 (!) 26  Temp: 97.8 F (36.6 C) 98.1 F (36.7 C) 98.2 F (36.8 C) 98.2 F (36.8  C)  TempSrc: Oral Oral Oral Oral  SpO2: 100% 99% 96% 97%  Weight:      Height:        Intake/Output Summary (Last 24 hours) at 06/23/2018 1049 Last data filed at 06/22/2018 1800 Gross per 24 hour  Intake 720 ml  Output 1200 ml  Net -480 ml   Filed Weights   06/20/18 0429 06/21/18 0530 06/22/18 0655  Weight: 94 kg 94.2 kg 95 kg   Exam  General: Well developed, well nourished, NAD, appears stated age  HEENT: NCAT, mucous membranes moist.   Neck: Supple  Cardiovascular: S1 S2 auscultated, RRR, no murmur  Respiratory: Clear to auscultation bilaterally with equal chest rise  Abdomen: Soft, obese, nontender, nondistended, + bowel sounds  Extremities: warm dry without cyanosis clubbing. ++LE edema with blistering  Neuro: AAOx3, nonfocal  Psych: pleasant, appropriate mood and affect   Data Reviewed: I have personally reviewed following labs and imaging studies  CBC: Recent Labs  Lab 06/17/18 0423 06/19/18 0650 06/22/18 0648 06/23/18 0451  WBC 14.0* 13.8* 19.1* 19.1*  NEUTROABS  --  12.2* 17.1*  --   HGB 10.9* 10.5* 10.7* 10.9*  HCT 34.0* 32.9* 33.7* 34.1*  MCV 87.6 87.0 86.9 86.8  PLT 514* 633* 683* 992*   Basic Metabolic Panel: Recent Labs  Lab 06/17/18 0423 06/19/18 0650 06/22/18 0648 06/23/18 0451  NA 134* 137 131* 132*  K 3.7 3.4* 3.8 3.7  CL 89* 88* 83* 83*  CO2 33* 35* 35*  35*  GLUCOSE 177* 212* 202* 175*  BUN _0 29*  CREATININE 0.63 0.52 0.80 0.83  CALCIUM 8.2* 8.2* 8.3* 8.3*  MG 2.2  --   --   --    GFR: Estimated Creatinine Clearance: 76.8 mL/min (by C-G formula based on SCr of 0.83 mg/dL). Liver Function Tests: Recent Labs  Lab 06/17/18 0423 06/19/18 0650 06/22/18 0648  AST 44* 104* 101*  ALT 37 71* 119*  ALKPHOS 130* 206* 255*  BILITOT 0.7 0.6 0.8  PROT 6.3* 6.5 7.1  ALBUMIN 1.8* 1.6* 1.7*   No results for input(s): LIPASE, AMYLASE in the last 168 hours. No results for input(s): AMMONIA in the last 168 hours. Coagulation  Profile: No results for input(s): INR, PROTIME in the last 168 hours. Cardiac Enzymes: No results for input(s): CKTOTAL, CKMB, CKMBINDEX, TROPONINI in the last 168 hours. BNP (last 3 results) No results for input(s): PROBNP in the last 8760 hours. HbA1C: No results for input(s): HGBA1C in the last 72 hours. CBG: No results for input(s): GLUCAP in the last 168 hours. Lipid Profile: No results for input(s): CHOL, HDL, LDLCALC, TRIG, CHOLHDL, LDLDIRECT in the last 72 hours. Thyroid Function Tests: No results for input(s): TSH, T4TOTAL, FREET4, T3FREE, THYROIDAB in the last 72 hours. Anemia Panel: No results for input(s): VITAMINB12, FOLATE, FERRITIN, TIBC, IRON, RETICCTPCT in the last 72 hours. Urine analysis: No results found for: COLORURINE, APPEARANCEUR, LABSPEC, [place], GLUCOSEU, HGBUR, BILIRUBINUR, KETONESUR, PROTEINUR, UROBILINOGEN, NITRITE, LEUKOCYTESUR Sepsis Labs: _1 (procalcitonin:4,lacticidven:4)  ) Recent Results (from the past 240 hour(s))  Culture, blood (single)     Status: None   Collection Time: 06/15/18  5:40 AM  Result Value Ref Range Status   Specimen Description BLOOD RIGHT HAND  Final   Special Requests   Final    BOTTLES DRAWN AEROBIC ONLY Blood Culture adequate volume   Culture   Final    NO GROWTH 5 DAYS Performed at [facility], [address], [place], [place] [address]    Report Status 06/20/2018 FINAL  Final  Acid Fast Smear (AFB)     Status: None   Collection Time: 06/17/18  3:37 PM  Result Value Ref Range Status   AFB Specimen Processing Concentration  Final   Acid Fast Smear Negative  Final    Comment: (NOTE) Performed At: [facility] [place], [place] [id number] Rush Farmer MD DZ:[id number]    Source (AFB) SPUTUM  Final    Comment: Performed at [facility], [place] [address] [place], [place] [address]  Expectorated sputum assessment w rflx to resp cult     Status: None   Collection Time:  06/17/18  3:37 PM  Result Value Ref Range Status   Specimen Description EXPECTORATED SPUTUM  Final   Special Requests NONE  Final   Sputum evaluation   Final    THIS SPECIMEN IS ACCEPTABLE FOR SPUTUM CULTURE Performed at [facility], [place] [address], [place], [place] [address]    Report Status 06/17/2018 FINAL  Final  Culture, respiratory     Status: None   Collection Time: 06/17/18  3:37 PM  Result Value Ref Range Status   Specimen Description EXPECTORATED SPUTUM  Final   Special Requests NONE Reflexed from [id number]  Final   Gram Stain   Final    FEW WBC PRESENT,BOTH PMN AND MONONUCLEAR NO ORGANISMS SEEN Performed at [facility], [place] [address], [place], [place] [address]    Culture FEW  CANDIDA ALBICANS  Final   Report Status 06/19/2018 FINAL  Final  Acid Fast Smear (AFB)     Status: None   Collection Time: 06/18/18  4:40 PM  Result Value Ref Range Status   AFB Specimen Processing Concentration  Final   Acid Fast Smear Negative  Final    Comment: (NOTE) Performed At: [facility] [place], [place] [id number] Rush Farmer MD [id number]    Source (AFB) BRONCHIAL ALVEOLAR LAVAGE  Corrected    Comment: LLL Performed at [facility], [address], [place], [place] [address] CORRECTED ON 05/28 AT 1908: PREVIOUSLY REPORTED AS BRONCHIAL WASHINGS LLL   Fungus Culture With Stain     Status: None   Collection Time: 06/18/18  4:40 PM  Result Value Ref Range Status   Fungus Stain Final report  Final    Comment: (NOTE) Performed At: [facility] [place], [place] [id number] Rush Farmer MD [id number]    Fungus (Mycology) Culture PENDING  Incomplete   Fungal Source BRONCHIAL ALVEOLAR LAVAGE  Corrected    Comment: LLL Performed at [facility], [place] [address], [place], [place] [address] CORRECTED ON 05/28 AT 1908: PREVIOUSLY REPORTED AS BRONCHIAL WASHINGS LLL   Culture, bal-quantitative     Status: None    Collection Time: 06/18/18  4:40 PM  Result Value Ref Range Status   Specimen Description BRONCHIAL ALVEOLAR LAVAGE LLL  Final   Special Requests NONE  Final   Gram Stain   Final    FEW WBC PRESENT, PREDOMINANTLY PMN NO ORGANISMS SEEN    Culture   Final    NO GROWTH Performed at [facility], [place] [address], [place],  [address]    Report Status 06/21/2018 FINAL  Final  Fungus Culture Result     Status: None   Collection Time: 06/18/18  4:40 PM  Result Value Ref Range Status   Result 1 Comment  Final    Comment: (NOTE) KOH/Calcofluor preparation:  no fungus observed. Performed At: [facility] [place], [place] [id number] Rush Farmer MD SJ:[id number]       Radiology Studies: No results found.   Scheduled Meds: . benzonatate  100 mg Oral TID  . chlorpheniramine-HYDROcodone  5 mL Oral Q12H  . furosemide  80 mg Intravenous BID  . guaiFENesin  600 mg Oral BID  . methocarbamol  500 mg Oral TID  . midazolam  2 mg Intravenous Once  . senna-docusate  1 tablet Oral BID  . sodium chloride flush  3 mL Intravenous Q12H   Continuous Infusions:   LOS: 14 days   Time Spent in minutes   30 minutes  Maryann Mikhail D.O. on 06/23/2018 at 10:49 AM  Between 7am to 7pm - Please see pager noted on [email or web address]  After 7pm go to [email or web address]  And look for the night coverage person covering for me after hours  [facility] Office  [phone]

## 2018-06-23 NOTE — Plan of Care (Signed)
  Problem: Pain Managment: Goal: General experience of comfort will improve Outcome: Progressing   Problem: Safety: Goal: Ability to remain free from injury will improve Outcome: Progressing   Problem: Skin Integrity: Goal: Risk for impaired skin integrity will decrease Outcome: Progressing   

## 2018-06-24 ENCOUNTER — Inpatient Hospital Stay (HOSPITAL_COMMUNITY): Payer: Medicare HMO

## 2018-06-24 DIAGNOSIS — R21 Rash and other nonspecific skin eruption: Secondary | ICD-10-CM

## 2018-06-24 DIAGNOSIS — D72829 Elevated white blood cell count, unspecified: Secondary | ICD-10-CM

## 2018-06-24 DIAGNOSIS — R600 Localized edema: Secondary | ICD-10-CM

## 2018-06-24 DIAGNOSIS — R238 Other skin changes: Secondary | ICD-10-CM

## 2018-06-24 LAB — BASIC METABOLIC PANEL
Anion gap: 15 (ref 5–15)
BUN: 32 mg/dL — ABNORMAL HIGH (ref 8–23)
CO2: 35 mmol/L — ABNORMAL HIGH (ref 22–32)
Calcium: 8.5 mg/dL — ABNORMAL LOW (ref 8.9–10.3)
Chloride: 81 mmol/L — ABNORMAL LOW (ref 98–111)
Creatinine, Ser: 0.91 mg/dL (ref 0.44–1.00)
GFR calc Af Amer: >60 mL/min (ref >60)
GFR calc non Af Amer: >60 mL/min (ref >60)
Glucose, Bld: 161 mg/dL — ABNORMAL HIGH (ref 70–99)
Potassium: 3.6 mmol/L (ref 3.5–5.1)
Sodium: 131 mmol/L — ABNORMAL LOW (ref 135–145)

## 2018-06-24 LAB — CBC
HCT: 32.3 % — ABNORMAL LOW (ref 36.0–46.0)
Hemoglobin: 10.5 g/dL — ABNORMAL LOW (ref 12.0–15.0)
MCH: 27.7 pg (ref 26.0–34.0)
MCHC: 32.5 g/dL (ref 30.0–36.0)
MCV: 85.2 fL (ref 80.0–100.0)
Platelets: 684 10*3/uL — ABNORMAL HIGH (ref 150–400)
RBC: 3.79 MIL/uL — ABNORMAL LOW (ref 3.87–5.11)
RDW: 14.1 % (ref 11.5–15.5)
WBC: 17.5 10*3/uL — ABNORMAL HIGH (ref 4.0–10.5)
nRBC: 0 % (ref 0.0–0.2)

## 2018-06-24 MED ORDER — AQUAPHOR EX OINT
TOPICAL_OINTMENT | Freq: Two times a day (BID) | CUTANEOUS | Status: DC
  Administered 2018-06-24 – 2018-06-26 (×5): via TOPICAL
  Filled 2018-06-24: qty 50

## 2018-06-24 MED ORDER — METHOCARBAMOL 500 MG PO TABS: 500.0000 mg | ORAL_TABLET | Freq: Three times a day (TID) | ORAL | Status: DC | PRN

## 2018-06-24 NOTE — Progress Notes (Signed)
Modified Barium Swallow Progress Note  Patient Details  Name: Kerstie Agent MRN: [id number] Date of Birth: May 11, 1950  Today's Date: 06/24/2018  Modified Barium Swallow completed.  Full report located under Chart Review in the Imaging Section.  Brief recommendations include the following:  Clinical Impression  Pt demonstrates normal swallow function. She could not swallow a barium tablet, could not orally transit despite liquid and puree boluses given, said it was too big. No SLP f/u needed. Continue current diet.    Swallow Evaluation Recommendations       SLP Diet Recommendations: Regular solids;Thin liquid   Liquid Administration via: Cup;Straw   Medication Administration: Whole meds with liquid   Supervision: Patient able to self feed                  Herbie Baltimore, MA CCC-SLP  Acute Rehabilitation Services Pager [phone] Office [phone]   Nirav Sweda, Katherene Ponto 06/24/2018,2:47 PM

## 2018-06-24 NOTE — Progress Notes (Addendum)
Daily Nursing Note:  Received report from [place], Therapist, sports. Upon arrival to the room, patient was resting comfortably. After an assessment from wound care, the suspected cause of lower exteremly blistering was not identified. Will follow up with infectious disease and vascular. Applied socks to patients feet while ambulating, will remove at rest due to risk of maceration from serous blister drainage on feet. Patient consented to another biopsy tomorrow (05/24/2018). Infectious diseases was unsure of the etiology of her lower extremity wounds. All patient needs were met throughout the day.  Note completed by Griffin Basil, Nurse Extern

## 2018-06-24 NOTE — Progress Notes (Signed)
Physical Therapy Note  SATURATION QUALIFICATIONS: (This note is used to comply with regulatory documentation for home oxygen)  Patient Saturations on Room Air at Rest = 88%  Patient Saturations on Room Air while Ambulating = 83%  Patient Saturations on 3 Liters of oxygen while Ambulating = 92%  Please briefly explain why patient needs home oxygen: Patient requires supplemental oxygen to maintain oxygen saturations at acceptable, safe levels with physical activity.  Roney [place], [place]  Acute Rehabilitation Services Pager [phone] Office [phone]

## 2018-06-24 NOTE — H&P (View-Only) (Signed)
° °NAME:  Julia Johns, MRN:  [id number], DOB:  11/02/1950, LOS: 15 °ADMISSION DATE:  06/09/2018, CONSULTATION DATE:  06/24/2018  °REFERRING MD:  Dr. Patel, CHIEF COMPLAINT:  Fever, abnormal CT   ° °Brief History   °This is a 68-year-old female who presented as a transfer from [facility] with complaints of shortness of breath and blood-tinged sputum.  Patient underwent CT imaging which revealed a paraspinal soft tissue thickening from T6-T10 as well as a left lower lobe density within the CT which was enlarged from prior imaging concerning for an inflammatory/infectious etiology.  Patient was found to be febrile.  Patient was admitted to [facility] under airborne isolation, COVID negative, pending workup AFB/ quantiferon.  Pulmonary was consulted for recommendations and management of the left lower lobe density.   ° °Since AFB x 2 negative, quantiferon indeterminate, blood cultures negative, sputum pending.  TCTS consulted who did not recommended any surgical evaluation at that point. IR did not feel that it was safe to to try needle aspiration of the paravertebral fluid.  ID has been following and patient has been on meropenum.  Despite broad spectrum abx, she has continued to have an unclear definitive diagnosis.  Despite meropenum, she has persistent ongoing leukocytosis, cough, SOB, persistent blood-tinged sputum (stable Hgb), and severe back pain.  PCCM re-consulted 5/26 given worsening hypoxia and enlarging LLL masslike consolidation.   ° °Past Medical History  °Obesity ° °Significant Hospital Events   °06/09/2018: Admission; tx from [facility]  ° °Consults:  °PCCM °Cardiothoracic surgery-  Consult 5/19 °Interventional radiology °ID  ° °Procedures:  ° °Significant Diagnostic Tests:  °06/09/2018 CTA chest  health >> °- No definite evidence of large central pulmonary embolus is noted, but smaller peripheral pulmonary emboli in the lower branches of the pulmonary arteries cannot be excluded on  the basis of this exam due to respiratory motion artifact.  °- continued presence of prevertebral soft tissue density concerning for possible malignancy or infection °- 4.5 x 2.9 cm lobulated density noted in the left lower posteriorly which is slightly enlarged compared to prior exam.  This is concerning for possible mass or neoplasm with surrounding atelectasis or infiltrate. °- right lower atelectasis or pneumonia is noted with minimal associated pleural effusion °- probable thyroid goiter again noted °- hepatic steatosis  ° °5/24 CT abd/ pelvis >> °1. Sigmoid diverticulosis. There is mild persistent fat stranding adjacent to the proximal sigmoid colon (series 3, image 60). This may reflect persistent diverticulitis/colitis or alternately chronic sequelae. No evidence of worsening colitis or developing complication such as abscess or perforation. °2. No other CT findings of the abdomen or pelvis to explain abdominal distension, nausea, vomiting, or pain. No evidence of bowel obstruction. °3. There are bilateral pleural effusions with associated atelectasis °or consolidation and a large, masslike consolidation of the dependent left lung base measuring at least 4.9 cm, which appears to be enlarged compared to prior CT of the chest dated 06/09/2018 (series 3, image 8). °4. There is paravertebral fluid seen about the partially included anterior lower thoracic spine and diaphragmatic cruces (series 3, image 9). Consider dedicated chest imaging to further evaluate interval change in these findings. ° °TTE 5/26 >> °Normal EF 60-65.  No wall motion abnormalities.  Normal RV function.  No effusion.  ° °5/26 CTA PE >> °1. No demonstrable pulmonary embolus. No thoracic aortic aneurysm or °dissection. °2. Fairly small right pleural effusions, slightly larger than on °recent studies. Multifocal airspace consolidation, progressed. °Suspect consolidation surrounding mass   in left lower lobe °posteriorly. °3.  Subcentimeter lymph  nodes without frank adenopathy. °4. Enlarged left lobe of thyroid with dominant thyroid mass °measuring 2.8 x 2.7 cm in the left lobe. It may be prudent to °correlate with ultrasound given dominant thyroid mass on the left. °5. The paraspinous lesion from T6-T10 noted on recent studies is °better seen on recent thoracic MR and chest CT not bolus timed to °optimize arterial vascular visualization. Neoplastic etiology in °this paraspinous lesion must be of concern as noted recent thoracic °MR. °6. Mild reflux of contrast into the inferior vena cava and hepatic °veins may indicate a degree of increase in right heart pressure. °7.  Hepatic steatosis. ° °Micro Data:  °5/19 Blood cultures: Negative °5/19 MRSA PCR: Negative °5/20 AFB: negative °5/22 AFB: negative °5/22 expectorated sputum << few candida albicans °5/25 Cdiff >> canceled  °5/25 BC x 1 >> ngtd ° °5/19 QuantiFERON:  indeterminate °5/19 HIV neg °5/25 urine strep and legionella negative ° °5/27 AFB > neg °5/27 expectorated sputum > few candida albicans.  °5/28 BAL > negative °5/28 Fungus culture >>> °5/28 AFB BAL > negative ° °Antimicrobials:  °5/20 Meropenem >> °5/27 vanc >> ° °Interim history/subjective:  °Continues on 3L nasal cannula, Still having some dyspnea with ambulation. No complaints otherwise.  ° ° °Objective   °Blood pressure (!) 99/53, pulse 92, temperature 98.4 °F (36.9 °C), temperature source Oral, resp. rate (!) 27, height 5' 7" (1.702 m), weight 90.9 kg, SpO2 97 %. °   °   ° °Intake/Output Summary (Last 24 hours) at 06/24/2018 1037 °Last data filed at 06/24/2018 0804 °Gross per 24 hour  °Intake 10 ml  °Output --  °Net 10 ml  ° °Filed Weights  ° 06/21/18 0530 06/22/18 0655 06/24/18 0345  °Weight: 94.2 kg 95 kg 90.9 kg  ° ° °Examination: °General:  Middle aged female seated in bedside chair.  °HEENT: [place]/AT, No appreciable JVD °Neuro: Alert, oriented, non-focal °CV:  RRR, no murmur °PULM: even/non-labored, clear bilateral breath sounds. 3L [place] °GI:  obese, soft, non-tender, bs active  °Extremities:1+ edema °Skin: no rashes ° ° °Resolved Hospital Problem list   ° ° °Assessment & Plan:  ° °Acute hypoxemic respiratory failure  - improving °Left lower lobe rounded density, in the setting of fever cough sputum production, persistent blood tinged sputum, leukocytosis -started off as a 1.7 cm nodule on CT abdomen 5/13 and has increased to current size of 4 cm-more likely to be infectious or inflammatory although no cavitation noted.   °Small bilateral pleural effusions °- unclear definitive diagnosis.  °- r/o TB- AFBs neg x 2, Quantiferon TB indeterminate  °- Bronchoscopy 5/28. No culture growth. Cytology with atypical cells. No malignancy identified.  °P:  °Continue supplemental oxygen PRN for O2 sat goal 90-95% °S/p course of antibiotics °May need home O2 at discharge.  °Will ask GI to evaluate CT scan to see if any benefit from esophagus biopsy vs repeat bronchoscopy. Either of these could potentially be done as outpatient if she is deemed a candidate for discharge.   ° °Right upper lobe pulmonary nodule °P: °She will need follow-up for the small subcentimeter right upper lobe pulmonary nodule, follow-up for this will be noncontrasted CT 9 to 12 months. ° °T6-T10 prevertebral collection -clarified on MRI from . As above. ° °Julia Johns, AGACNP-BC °[facility] °Pager [phone] or [phone] ° °06/24/2018 10:39 AM  ° ° °Update: have discussed with GI. Wish for us to evaluate her swallowing via   history and possibly barium swallow, if any of this is abnormal. We should call [facility] back, who could potentially see her for endoscopy and potentially esophageal ultrasound on 6/5.  ° ° °Julia Johns, AGACNP-BC °[facility] °Pager [phone] or [phone] ° °06/24/2018 11:27 AM ° ° ° ° ° ° ° ° ° ° ° °

## 2018-06-24 NOTE — Anesthesia Preprocedure Evaluation (Addendum)
Anesthesia Evaluation  Patient identified by MRN, date of birth, ID band Patient awake    Reviewed: Allergy & Precautions, NPO status , Patient's Chart, lab work & pertinent test results  History of Anesthesia Complications Negative for: history of anesthetic complications  Airway Mallampati: II  TM Distance: >3 FB Neck ROM: Full    Dental  (+) Edentulous Upper, Edentulous Lower   Pulmonary pneumonia, unresolved,  Hemoptysis, LLL mass concerning for malignancy   Pulmonary exam normal        Cardiovascular negative cardio ROS Normal cardiovascular exam     Neuro/Psych negative neurological ROS     GI/Hepatic negative GI ROS, Neg liver ROS,   Endo/Other  negative endocrine ROS  Renal/GU negative Renal ROS     Musculoskeletal negative musculoskeletal ROS (+)   Abdominal   Peds  Hematology  (+) anemia , Hgb 10.5   Anesthesia Other Findings Day of surgery medications reviewed with the patient.  Reproductive/Obstetrics                            Anesthesia Physical Anesthesia Plan  ASA: III  Anesthesia Plan: General   Post-op Pain Management:    Induction: Intravenous  PONV Risk Score and Plan: 3 and Treatment may vary due to age or medical condition, Ondansetron and Propofol infusion  Airway Management Planned: Oral ETT  Additional Equipment:   Intra-op Plan:   Post-operative Plan: Extubation in OR  Informed Consent: I have reviewed the patients History and Physical, chart, labs and discussed the procedure including the risks, benefits and alternatives for the proposed anesthesia with the patient or authorized representative who has indicated his/her understanding and acceptance.     Dental advisory given  Plan Discussed with: CRNA  Anesthesia Plan Comments:        Anesthesia Quick Evaluation

## 2018-06-24 NOTE — Progress Notes (Signed)
[facility] for Infectious Disease  Date of Admission:  06/09/2018            ASSESSMENT/PLAN  Ms. Julia Johns has a left lower lobe lung mass that does not appear to be infectious and with concern for malignancy, however first biopsy obtained without evidence of malignancy. Awaiting repeat bronchoscopy tomorrow. Course now complicated with development of a rash/skin lesions located on her bilateral distal lower extremities.  Mass of lower lobe of left lung - Concern remains for malignancy with video bronchoscopy with endobronchial ultrasound pending for tomorrow to obtain further biopsy results to rule out malignancy. She has been afebrile and maintained moderate leukocytosis while off antibiotics. Recommend continuing to monitor pending bronchoscopy.  Rash - Newly found rash of unclear duration. She does have edema of her bilateral legs which is likely a contributing factor. No clear evidence of infection at present. Differentials at this point include pyoderma gangranosum, Sweet's syndrome, IgA bullus dermatosis, or possible venous/arterial disease. Certainly underlying potential malignancy may be a source as well. Recommend biopsy by general surgery for pathology identification and continue to elevate legs to decrease edema. Continue with wound care recommendations for now. No antibiotics are needed at the present time.   Principal Problem:   Lobar pneumonia ([place]) Active Problems:   Paravertebral mass   Mass of lower lobe of left lung   Colitis   Acute respiratory failure with hypoxia (HCC)   Bilateral lower extremity edema   Obesity (BMI 30.0-34.9)   . benzonatate  100 mg Oral TID  . furosemide  80 mg Intravenous BID  . guaiFENesin  600 mg Oral BID  . methocarbamol  500 mg Oral TID  . midazolam  2 mg Intravenous Once  . mineral oil-hydrophilic petrolatum   Topical BID  . senna-docusate  1 tablet Oral BID  . sodium chloride flush  3 mL Intravenous Q12H     SUBJECTIVE:  Ms. Julia Johns was last seen by ID on 06/20/18 with suspected left lower lung mass complicated with pneumonia with concern for malignancy. Bronchoscopy with biopsy performed with cytology showing atypical cells with no malignancy identified through immunostains. Pulmonology recommending repeat biopsy to clarify or possible repeat bronchoscopy. AFB smears were negative with AFB cultures pending. Nursing noted bilateral blisters on the anterior aspect of both feet of uncertain duration. Wound RN evaluated with unclear etiology. THere was also patches of purpuric rash to the posterior legs that appeared with black eschar over it on the posterior right inner/upper thigh possibly vasculitis.   Allergies  Allergen Reactions  . Penicillins Anaphylaxis    Patient stated that within a few minutes of receiving a penicillin IM injection she rapidly became unconscious and required "additional" medication to recover. This occurred when she was ~68 years old and she does not recall what was given to assist in her recovery. She did not develop a rash.      Review of Systems: Review of Systems  Constitutional: Negative for chills, fever, malaise/fatigue and weight loss.  Respiratory: Negative for cough, shortness of breath and wheezing.   Cardiovascular: Positive for leg swelling. Negative for chest pain.  Gastrointestinal: Negative for abdominal pain, constipation, diarrhea, nausea and vomiting.  Skin: Positive for rash.    OBJECTIVE: Vitals:   06/24/18 0327 06/24/18 0343 06/24/18 0345 06/24/18 0729  BP: (!) 82/64 97/63  (!) 99/53  Pulse: 93   92  Resp: (!) 22   (!) 27  Temp: 98 F (36.7 C)   98.4 F (  36.9 C)  TempSrc: Oral   Oral  SpO2: 100%   97%  Weight:   90.9 kg   Height:       Body mass index is 31.39 kg/m.  Physical Exam Constitutional:      General: She is not in acute distress.    Appearance: She is well-developed. She is obese. She is not ill-appearing.  Cardiovascular:      Rate and Rhythm: Normal rate and regular rhythm.     Heart sounds: Normal heart sounds.  Pulmonary:     Effort: Pulmonary effort is normal.     Breath sounds: Normal breath sounds.  Skin:    General: Skin is warm and dry.     Comments: Bilateral lower extremities initially wrapped in Kerlex. There is bilateral lower extremity edema with vesicles and bullae located on the anterior shins with moisture present. Bilateral feet with dark, purplish color as well as ulcerations located between toes and on the posterior legs as pictured.   Neurological:     General: No focal deficit present.     Mental Status: She is alert.  Psychiatric:        Mood and Affect: Mood normal.          Posterior left knee/leg       Right foot   Right tibia/shin  Lab Results Lab Results  Component Value Date   WBC 17.5 (H) 06/24/2018   HGB 10.5 (L) 06/24/2018   HCT 32.3 (L) 06/24/2018   MCV 85.2 06/24/2018   PLT 684 (H) 06/24/2018    Lab Results  Component Value Date   CREATININE 0.91 06/24/2018   BUN 32 (H) 06/24/2018   NA 131 (L) 06/24/2018   K 3.6 06/24/2018   CL 81 (L) 06/24/2018   CO2 35 (H) 06/24/2018    Lab Results  Component Value Date   ALT 119 (H) 06/22/2018   AST 101 (H) 06/22/2018   ALKPHOS 255 (H) 06/22/2018   BILITOT 0.8 06/22/2018     Microbiology: Recent Results (from the past 240 hour(s))  Culture, blood (single)     Status: None   Collection Time: 06/15/18  5:40 AM  Result Value Ref Range Status   Specimen Description BLOOD RIGHT HAND  Final   Special Requests   Final    BOTTLES DRAWN AEROBIC ONLY Blood Culture adequate volume   Culture   Final    NO GROWTH 5 DAYS Performed at [facility], [address], [place], [place] [address]    Report Status 06/20/2018 FINAL  Final  Acid Fast Smear (AFB)     Status: None   Collection Time: 06/17/18  3:37 PM  Result Value Ref Range Status   AFB Specimen Processing Concentration  Final   Acid Fast Smear  Negative  Final    Comment: (NOTE) Performed At: [facility] [place], [place] [id number] Rush Farmer MD [id number]    Source (AFB) SPUTUM  Final    Comment: Performed at [facility], [place] [address] [place], [place] [address]  Expectorated sputum assessment w rflx to resp cult     Status: None   Collection Time: 06/17/18  3:37 PM  Result Value Ref Range Status   Specimen Description EXPECTORATED SPUTUM  Final   Special Requests NONE  Final   Sputum evaluation   Final    THIS SPECIMEN IS ACCEPTABLE FOR SPUTUM CULTURE Performed at [facility], [place] [address], [place], [place]  [address]    Report Status 06/17/2018 FINAL  Final  Culture, respiratory     Status: None   Collection Time: 06/17/18  3:37 PM  Result Value Ref Range Status   Specimen Description EXPECTORATED SPUTUM  Final   Special Requests NONE Reflexed from [id number]  Final   Gram Stain   Final    FEW WBC PRESENT,BOTH PMN AND MONONUCLEAR NO ORGANISMS SEEN Performed at [facility], [address], [place], [place] [address]    Culture FEW CANDIDA ALBICANS  Final   Report Status 06/19/2018 FINAL  Final  Acid Fast Smear (AFB)     Status: None   Collection Time: 06/18/18  4:40 PM  Result Value Ref Range Status   AFB Specimen Processing Concentration  Final   Acid Fast Smear Negative  Final    Comment: (NOTE) Performed At: [facility] [place], [place] [id number] Rush Farmer MD CV:[id number]    Source (AFB) BRONCHIAL ALVEOLAR LAVAGE  Corrected    Comment: LLL Performed at [facility], [place] [address] [place], [place] [address] CORRECTED ON 05/28 AT 1908: PREVIOUSLY REPORTED AS BRONCHIAL WASHINGS LLL   Fungus Culture With Stain     Status: None   Collection Time: 06/18/18  4:40 PM  Result Value Ref Range Status   Fungus Stain Final report  Final    Comment: (NOTE) Performed At: [facility] [place], [place]  [id number] Rush Farmer MD [id number]    Fungus (Mycology) Culture PENDING  Incomplete   Fungal Source BRONCHIAL ALVEOLAR LAVAGE  Corrected    Comment: LLL Performed at [facility], [place] [address] [place], [place] [address] CORRECTED ON 05/28 AT 1908: PREVIOUSLY REPORTED AS BRONCHIAL WASHINGS LLL   Culture, bal-quantitative     Status: None   Collection Time: 06/18/18  4:40 PM  Result Value Ref Range Status   Specimen Description BRONCHIAL ALVEOLAR LAVAGE LLL  Final   Special Requests NONE  Final   Gram Stain   Final    FEW WBC PRESENT, PREDOMINANTLY PMN NO ORGANISMS SEEN    Culture   Final    NO GROWTH Performed at [facility], [place] [address] [place], [place] [address]    Report Status 06/21/2018 FINAL  Final  Fungus Culture Result     Status: None   Collection Time: 06/18/18  4:40 PM  Result Value Ref Range Status   Result 1 Comment  Final    Comment: (NOTE) KOH/Calcofluor preparation:  no fungus observed. Performed At: [facility] [address] [place], [place] [id number] Rush Farmer MD XI:[id number]      Terri Piedra, [place] [facility] [phone] Pager  06/24/2018  12:01 PM

## 2018-06-24 NOTE — Progress Notes (Signed)
NAME:  Julia Johns, MRN:  [id number], DOB:  [birth year], LOS: 64 ADMISSION DATE:  06/09/2018, CONSULTATION DATE:  06/24/2018  REFERRING MD:  Dr. Posey Pronto, CHIEF COMPLAINT:  Fever, abnormal CT    Brief History   This is a 68 year old female who presented as a transfer from [facility] with complaints of shortness of breath and blood-tinged sputum.  Patient underwent CT imaging which revealed a paraspinal soft tissue thickening from T6-T10 as well as a left lower lobe density within the CT which was enlarged from prior imaging concerning for an inflammatory/infectious etiology.  Patient was found to be febrile.  Patient was admitted to [facility] under airborne isolation, COVID negative, pending workup AFB/ quantiferon.  Pulmonary was consulted for recommendations and management of the left lower lobe density.    Since AFB x 2 negative, quantiferon indeterminate, blood cultures negative, sputum pending.  TCTS consulted who did not recommended any surgical evaluation at that point. IR did not feel that it was safe to to try needle aspiration of the paravertebral fluid.  ID has been following and patient has been on meropenum.  Despite broad spectrum abx, she has continued to have an unclear definitive diagnosis.  Despite meropenum, she has persistent ongoing leukocytosis, cough, SOB, persistent blood-tinged sputum (stable Hgb), and severe back pain.  PCCM re-consulted 5/26 given worsening hypoxia and enlarging LLL masslike consolidation.    Past Medical History  Obesity  Significant Hospital Events   06/09/2018: Admission; tx from [facility]:  PCCM Cardiothoracic surgery-  Consult 5/19 Interventional radiology ID   Procedures:   Significant Diagnostic Tests:  06/09/2018 CTA chest [facility] health >> - No definite evidence of large central pulmonary embolus is noted, but smaller peripheral pulmonary emboli in the lower branches of the pulmonary arteries cannot be excluded on  the basis of this exam due to respiratory motion artifact.  - continued presence of prevertebral soft tissue density concerning for possible malignancy or infection - 4.5 x 2.9 cm lobulated density noted in the left lower posteriorly which is slightly enlarged compared to prior exam.  This is concerning for possible mass or neoplasm with surrounding atelectasis or infiltrate. - right lower atelectasis or pneumonia is noted with minimal associated pleural effusion - probable thyroid goiter again noted - hepatic steatosis   5/24 CT abd/ pelvis >> 1. Sigmoid diverticulosis. There is mild persistent fat stranding adjacent to the proximal sigmoid colon (series 3, image 60). This may reflect persistent diverticulitis/colitis or alternately chronic sequelae. No evidence of worsening colitis or developing complication such as abscess or perforation. 2. No other CT findings of the abdomen or pelvis to explain abdominal distension, nausea, vomiting, or pain. No evidence of bowel obstruction. 3. There are bilateral pleural effusions with associated atelectasis or consolidation and a large, masslike consolidation of the dependent left lung base measuring at least 4.9 cm, which appears to be enlarged compared to prior CT of the chest dated 06/09/2018 (series 3, image 8). 4. There is paravertebral fluid seen about the partially included anterior lower thoracic spine and diaphragmatic cruces (series 3, image 9). Consider dedicated chest imaging to further evaluate interval change in these findings.  TTE 5/26 >> Normal EF 60-65.  No wall motion abnormalities.  Normal RV function.  No effusion.   5/26 CTA PE >> 1. No demonstrable pulmonary embolus. No thoracic aortic aneurysm or dissection. 2. Fairly small right pleural effusions, slightly larger than on recent studies. Multifocal airspace consolidation, progressed. Suspect consolidation surrounding mass  in left lower lobe posteriorly. 3.  Subcentimeter lymph  nodes without frank adenopathy. 4. Enlarged left lobe of thyroid with dominant thyroid mass measuring 2.8 x 2.7 cm in the left lobe. It may be prudent to correlate with ultrasound given dominant thyroid mass on the left. 5. The paraspinous lesion from T6-T10 noted on recent studies is better seen on recent thoracic MR and chest CT not bolus timed to optimize arterial vascular visualization. Neoplastic etiology in this paraspinous lesion must be of concern as noted recent thoracic MR. 6. Mild reflux of contrast into the inferior vena cava and hepatic veins may indicate a degree of increase in right heart pressure. 7.  Hepatic steatosis.  Micro Data:  5/19 Blood cultures: Negative 5/19 MRSA PCR: Negative 5/20 AFB: negative 5/22 AFB: negative 5/22 expectorated sputum << few candida albicans 5/25 Cdiff >> canceled  5/25 BC x 1 >> ngtd  5/19 QuantiFERON:  indeterminate 5/19 HIV neg 5/25 urine strep and legionella negative  5/27 AFB > neg 5/27 expectorated sputum > few candida albicans.  5/28 BAL > negative 5/28 Fungus culture >>> 5/28 AFB BAL > negative  Antimicrobials:  5/20 Meropenem >> 5/27 vanc >>  Interim history/subjective:  Continues on 3L nasal cannula, Still having some dyspnea with ambulation. No complaints otherwise.    Objective   Blood pressure (!) 99/53, pulse 92, temperature 98.4 F (36.9 C), temperature source Oral, resp. rate (!) 27, height _0  (1.702 m), weight 90.9 kg, SpO2 97 %.        Intake/Output Summary (Last 24 hours) at 06/24/2018 1037 Last data filed at 06/24/2018 0804 Gross per 24 hour  Intake 10 ml  Output --  Net 10 ml   Filed Weights   06/21/18 0530 06/22/18 0655 06/24/18 0345  Weight: 94.2 kg 95 kg 90.9 kg    Examination: General:  Middle aged female seated in bedside chair.  HEENT: [place]/AT, No appreciable JVD Neuro: Alert, oriented, non-focal CV:  RRR, no murmur PULM: even/non-labored, clear bilateral breath sounds. 3L [place] GI:  obese, soft, non-tender, bs active  Extremities:1+ edema Skin: no rashes   Resolved Hospital Problem list     Assessment & Plan:   Acute hypoxemic respiratory failure  - improving Left lower lobe rounded density, in the setting of fever cough sputum production, persistent blood tinged sputum, leukocytosis -started off as a 1.7 cm nodule on CT abdomen 5/13 and has increased to current size of 4 cm-more likely to be infectious or inflammatory although no cavitation noted.   Small bilateral pleural effusions - unclear definitive diagnosis.  - r/o TB- AFBs neg x 2, Quantiferon TB indeterminate  - Bronchoscopy 5/28. No culture growth. Cytology with atypical cells. No malignancy identified.  P:  Continue supplemental oxygen PRN for O2 sat goal 90-95% S/p course of antibiotics May need home O2 at discharge.  Will ask GI to evaluate CT scan to see if any benefit from esophagus biopsy vs repeat bronchoscopy. Either of these could potentially be done as outpatient if she is deemed a candidate for discharge.    Right upper lobe pulmonary nodule P: She will need follow-up for the small subcentimeter right upper lobe pulmonary nodule, follow-up for this will be noncontrasted CT 9 to 12 months.  T6-T10 prevertebral collection -clarified on MRI from [place]. As above.  Georgann Housekeeper, AGACNP-BC [place] Pager [phone] or [phone]  06/24/2018 10:39 AM    Update: have discussed with GI. Wish for [place] to evaluate her swallowing via  history and possibly barium swallow, if any of this is abnormal. We should call [facility] back, who could potentially see her for endoscopy and potentially esophageal ultrasound on 6/5.    Georgann Housekeeper, AGACNP-BC [place] Pager [phone] or [phone]  06/24/2018 11:27 AM

## 2018-06-24 NOTE — Consult Note (Signed)
[facility] Nurse wound consult note A second page sent to Dr. Coralee Pesa via [facility] text pager to request a phone call for recommendations.  Awaiting a response. Val Riles, RN, MSN, CWOCN, CNS-BC, pager [phone]

## 2018-06-24 NOTE — Consult Note (Signed)
[facility] Nurse wound consult note Secure Chat message sent to Dr. Coralee Pesa requesting her to read my consult note from this morning. Dressing procedure/placement/frequency:  Cleanse the blackened area on the inner, upper thigh with soap and water. Pat dry. Apply a hydrocolloid dressing Kellie Simmering [phone]) over the area. Change daily. Monitor the wound area(s) for worsening of condition such as: Signs/symptoms of infection,  Increase in size,  Development of or worsening of odor, Development of pain, or increased pain at the affected locations.  Notify the medical team if any of these develop.  Thank you for the consult.  Discussed plan of care with the patient and bedside nurse.  [place] nurse will not follow at this time.  Please re-consult the [facility] team if needed.  Val Riles, RN, MSN, CWOCN, CNS-BC, pager [phone]

## 2018-06-24 NOTE — Consult Note (Signed)
[place] Nurse wound consult note Patient receiving care in [facility] 2W20.  A remote teleinterpreter device was used during the assessment and consult.  NT also present in the room. Reason for Consult: Ruptured blisters to LEs  Wound type: Unclear etiology at this time. Possibly related to pitting edema (in the presence of high dose lasix); possibly due to a malignant process or systemic infectious process.  In addition to the ruptured bullae on the feet and inner/upper thighs, she has patches of purpuric "rash" to the posterior legs.  There is one larger purpuric area that appears with black eschar over it on the posterior right inner/upper thigh.  These could represent some type of vasculitis.  Wound bed: discolored purple on the posterior legs; eschar on the one area as described above, and wet, macerated ruptured bullae scattered over the dorsum of both feet.  Also, from the base of the toes to the toe tips bilaterally, the feet are discolored purple.  Dressing procedure/placement/frequency: Gently cleanse legs, feet with warm water and soap. Do NOT soak the feet.    Cover ruptured blister areas with Xeroform gauze Kellie Simmering [phone]). Secure by wrapping with kerlex. Change each shift. feet. Pat dry, then apply Aquaphor to dry skin areas and follow topical wound care instructions.   An order for a low air loss mattress has been entered.  PLEASE CONSIDER a consult to infectious diseases for dermatologic management, and consider a biopsy of the lesions. Topical therapy to the areas will not solve the underlying condition(s) contributing to what is seen clinically from a cutaneous perspective. Monitor the wound area(s) for worsening of condition such as: Signs/symptoms of infection,  Increase in size,  Development of or worsening of odor, Development of pain, or increased pain at the affected locations.  Notify the medical team if any of these develop.  Thank you for the consult.  Discussed plan of care with the  patient and bedside nurse.  [place] nurse will not follow at this time.  Please re-consult the [place] team if needed.  Val Riles, RN, MSN, CWOCN, CNS-BC, pager [phone]

## 2018-06-24 NOTE — Progress Notes (Signed)
Physical Therapy Treatment Patient Details Name: Julia Johns MRN: [id number] DOB: 05-19-1950 Today's Date: 06/24/2018    History of Present Illness This is a 68 year old female who presented as a transfer from [facility] with complaints of shortness of breath and blood-tinged sputum.  Patient underwent CT imaging which revealed a paraspinal soft tissue thickening from T6-T10 as well as a left lower lobe density within the CT which was enlarged from prior imaging concerning for an inflammatory/infectious etiology.  Patient was found to be febrile.  Patient was admitted to [facility] under airborne isolation, COVID negative, pending workup AFB/ quantiferon.  Pulmonary was consulted for recommendations and management of the left lower lobe density.     PT Comments    Continuing work on functional mobility and activity tolerance;  More tired today, and didn't walk as far as last session; worth considering a Rollator RW to allow for seated rest breaks as needed  Follow Up Recommendations  Home health PT     Equipment Recommendations  Other (comment)(small rollator RW and Oxygen)    Recommendations for Other Services       Precautions / Restrictions Precautions Precautions: Fall Precaution Comments: Fall risk low and greatly reduced with UE support while ambulatiign    Mobility  Bed Mobility                  Transfers Overall transfer level: Needs assistance Equipment used: None Transfers: Sit to/from Stand Sit to Stand: Supervision         General transfer comment: No gross difficuly with sit to stand  Ambulation/Gait Ambulation/Gait assistance: Min guard(wit and without physical contact) Gait Distance (Feet): 70 Feet Assistive device: Rolling walker (2 wheeled) Gait Pattern/deviations: Step-through pattern;Decreased step length - right;Decreased step length - left     General Gait Details: Very slow cadence; tired, and at times closing her eyes;  Walked less distance today; used Conservator, museum/gallery [place] (Stroke Patients Only)       Balance     Sitting balance-Leahy Scale: Good       Standing balance-Leahy Scale: Fair                              Cognition Arousal/Alertness: Awake/alert(though sleepy) Behavior During Therapy: WFL for tasks assessed/performed Overall Cognitive Status: Within Functional Limits for tasks assessed                                        Exercises      General Comments General comments (skin integrity, edema, etc.): See other PT note of this date      Pertinent Vitals/Pain Pain Assessment: No/denies pain    Home Living                      Prior Function            PT Goals (current goals can now be found in the care plan section) Acute Rehab PT Goals Patient Stated Goal: Wants to know more about her sores PT Goal Formulation: With patient Time For Goal Achievement: 07/06/18 Potential to Achieve Goals: Good Progress towards PT goals: Not progressing toward goals - comment(very tired today)    Frequency  Min 3X/week      PT Plan Current plan remains appropriate;Equipment recommendations need to be updated    Co-evaluation              AM-PAC PT "6 Clicks" Mobility   Outcome Measure  Help needed turning from your back to your side while in a flat bed without using bedrails?: None Help needed moving from lying on your back to sitting on the side of a flat bed without using bedrails?: None Help needed moving to and from a bed to a chair (including a wheelchair)?: A Little Help needed standing up from a chair using your arms (e.g., wheelchair or bedside chair)?: None Help needed to walk in hospital room?: A Little Help needed climbing 3-5 steps with a railing? : A Lot 6 Click Score: 20    End of Session Equipment Utilized During Treatment: Gait belt;Oxygen Activity  Tolerance: Patient tolerated treatment well Patient left: in chair;with call bell/phone within reach Nurse Communication: Mobility status PT Visit Diagnosis: Unsteadiness on feet (R26.81)     Time: 1601-0932 PT Time Calculation (min) (ACUTE ONLY): 34 min  Charges:  $Gait Training: 23-37 mins                     Roney [place], [place]  Acute Rehabilitation Services Pager [phone] Office [place] 06/24/2018, 1:21 PM

## 2018-06-24 NOTE — Progress Notes (Signed)
SATURATION QUALIFICATIONS: (This note is used to comply with regulatory documentation for home oxygen)  Patient Saturations on Room Air at Rest = 94%  Patient Saturations on Room Air while Ambulating = 83-85*%  Patient Saturations on 2 Liters of oxygen while Ambulating = 91-95%

## 2018-06-24 NOTE — Progress Notes (Addendum)
PROGRESS NOTE    Julia Johns  LXB:[id number] DOB: 1950-09-16 DOA: 06/09/2018 PCP: Patient, No Pcp Per  Brief Narrative:Julia Henriquez Sorianois a 68 y.o.femalewithout significantmedical history presenting withSOB, blood-tinged sputum, CPas a [facility].She saw her PCP on last Monday. She presented to [facility] 4 days ago for these symptoms and evaluation including MRI of the T-spine showed aparaspinal soft tissue thickening T6-10 concerning for malignancy. Teleneurology was consulted and the patient was recommended to be admitted with plan for consultsat that time with ortho, neurosurgery, andID; MRI spine; PET scan. The patient declined to stay and requested outpatient f/u. Now with worsening SOB, does not feel like she can manage this outpatient and so she returned to [facility]. Vitals 89% on RA, improved to 93% on 2L [place] O2.PE unremarkable at [facility]. Labs unremarkable other than CRP 180 (normal <10 on their scale). Pre-BNP 142 ->981. Negative troponin, stable EKG. CTA negative for PE but "prevertebral densities" - malignancy vs. infection - and LLL density that is 4.5 x 2.9 cm concerning for mass with surrounding infiltrate.[facility] blood-tinged sputum and [facility]. Repeat COVID test negative(initial was alsonegative).The patient reports significant pain in the left thoracic back region and midsternal chest tightness. She has SOB and cough with hemoptysis. No apparent fever. No tobacco history.   I assumed care of the patient 06/24/2018 patient sitting up in chair on 3 L of oxygen updated family members.  Used interpreter system. Assessment & Plan:   Principal Problem:   Lobar pneumonia ([place]) Active Problems:   Paravertebral mass   Mass of lower lobe of left lung   Colitis   Acute respiratory failure with hypoxia (HCC)   Bilateral lower extremity edema   Obesity (BMI 30.0-34.9)  Acute hypoxemic respiratory  failure/left lower lobe rounded density, community-acquired pneumonia, right upper lobe pulmonary nodule -COVID was negative at the outside facility x2 -QuantiFERON-TB gold is indeterminate -Sputum cultures as well as AFB smear negative -Pulmonology and infectious disease consulted and appreciated -ESR 124, CRP 38.1 -ANCA titers c-ANCA1:80, p ANCA negative, GBM titers negative -HIV antibody negative -MRSA PCR negative -Status post bronchoscopy on 06/18/2018, preliminary results are concerning for malignancy -Continue antitussives -Antibiotics (vancomycin, merrem) were discontinued after initial bronc pathology reports -Pending official pathology report -will add on tussionex as patient continues to complain of painful cough -oxygen saturations at rest 91% on room air, with ambulation 86%. Patient will need home oxygen. PCCM considering repeat bronc versus biopsy of the paravertebral fluid collection  Paraspinal fluid collection -Neurosurgery, neurology, CT surgery, consulted and appreciated, currently no intervention  Bilateral proximal muscle weakness -Upper and lower extremities -Has been ongoing for the last several days -?  PMR or other inflammatory condition, paraneoplastic syndrome -Supportive care -Physical therapy now recommending HHPT  Acute diastolic heart failure/ Lower extremity edema -Echocardiogram EF 41-63%, LV diastolic parameters normal, no RWMA -Extremity Dopplers negative for DVT -Currently on IV Lasix 80 mg twice a day -Continue to monitor intake and output, weights -Will consult would care- TED hose removed today, small blistering noted -patient cannot elevated her legs due to increased pressure and feels she cannot breathe -Wound care concerned about purplish lesion concerning for malignancy or infectious etiology recommending ID consult.  Discussed with ID   Abdominal pain, with nausea and vomiting, sigmoid colitis -CT abdomen pelvis on 06/14/2018:  evidence of sigmoid colitis without perforation or abscess -C. difficile was ordered but not sent as patient was not having any further diarrhea -Abdominal symptoms seem  to have resolved  Obesity -BMI 32.51 -Patient to follow-up with PCP regarding lifestyle modifications  Generalized weakness -PT recommending HH - rolling walker, 3in 1  Constipation -suppository ordered  DVT Prophylaxis  SCDs  Code Status: Full  Family Communication:  Discussed with daughter-in-law today  Disposition Plan: Admitted. Pending improvement in respiratory status  Dispo home with home health.  Consultants Infectious disease PCCM Cardiothoracic surgery Neurology Neurosurgery  Procedures  Echocardiogram LE doppler   Estimated body mass index is 31.39 kg/m as calculated from the following:   Height as of this encounter: _0  (1.702 m).   Weight as of this encounter: 90.9 kg.     Subjective:  Sitting up in chair with legs hanging down has bilateral lower extremity swelling and is on 3 L of oxygen.  Denies any nausea vomiting constipation in fact she had some loose stools Objective: Vitals:   06/24/18 0343 06/24/18 0345 06/24/18 0729 06/24/18 1200  BP: 97/63  (!) 99/53 107/64  Pulse:   92 (!) 110  Resp:   (!) 27   Temp:   98.4 F (36.9 C) 98.4 F (36.9 C)  TempSrc:   Oral Oral  SpO2:   97% 96%  Weight:  90.9 kg    Height:        Intake/Output Summary (Last 24 hours) at 06/24/2018 1324 Last data filed at 06/24/2018 0804 Gross per 24 hour  Intake 10 ml  Output -  Net 10 ml   Filed Weights   06/21/18 0530 06/22/18 0655 06/24/18 0345  Weight: 94.2 kg 95 kg 90.9 kg    Examination:  General exam: Appears calm and comfortable  Respiratory system: Clear to auscultation. Respiratory effort normal. Cardiovascular system: S1 & S2 heard, RRR. No JVD, murmurs, rubs, gallops or clicks. No pedal edema. Gastrointestinal system: Abdomen is nondistended, soft and nontender. No  organomegaly or masses felt. Normal bowel sounds heard. Central nervous system: Alert and oriented. No focal neurological deficits. Extremities: Bilateral lower extremity 3+ edema multiple blisters Skin: No rashes, lesions or ulcers Psychiatry: Judgement and insight appear normal. Mood & affect appropriate.     Data Reviewed: I have personally reviewed following labs and imaging studies  CBC: Recent Labs  Lab 06/19/18 0650 06/22/18 0648 06/23/18 0451 06/24/18 0451  WBC 13.8* 19.1* 19.1* 17.5*  NEUTROABS 12.2* 17.1*  --   --   HGB 10.5* 10.7* 10.9* 10.5*  HCT 32.9* 33.7* 34.1* 32.3*  MCV 87.0 86.9 86.8 85.2  PLT 633* 683* 639* 850*   Basic Metabolic Panel: Recent Labs  Lab 06/19/18 0650 06/22/18 0648 06/23/18 0451 06/24/18 0451  NA 137 131* 132* 131*  K 3.4* 3.8 3.7 3.6  CL 88* 83* 83* 81*  CO2 35* 35* 35* 35*  GLUCOSE 212* 202* 175* 161*  BUN 18 23 29* 32*  CREATININE 0.52 0.80 0.83 0.91  CALCIUM 8.2* 8.3* 8.3* 8.5*   GFR: Estimated Creatinine Clearance: 68.5 mL/min (by C-G formula based on SCr of 0.91 mg/dL). Liver Function Tests: Recent Labs  Lab 06/19/18 0650 06/22/18 0648  AST 104* 101*  ALT 71* 119*  ALKPHOS 206* 255*  BILITOT 0.6 0.8  PROT 6.5 7.1  ALBUMIN 1.6* 1.7*   No results for input(s): LIPASE, AMYLASE in the last 168 hours. No results for input(s): AMMONIA in the last 168 hours. Coagulation Profile: No results for input(s): INR, PROTIME in the last 168 hours. Cardiac Enzymes: No results for input(s): CKTOTAL, CKMB, CKMBINDEX, TROPONINI in the last 168 hours. BNP (last  3 results) No results for input(s): PROBNP in the last 8760 hours. HbA1C: No results for input(s): HGBA1C in the last 72 hours. CBG: No results for input(s): GLUCAP in the last 168 hours. Lipid Profile: No results for input(s): CHOL, HDL, LDLCALC, TRIG, CHOLHDL, LDLDIRECT in the last 72 hours. Thyroid Function Tests: No results for input(s): TSH, T4TOTAL, FREET4, T3FREE,  THYROIDAB in the last 72 hours. Anemia Panel: No results for input(s): VITAMINB12, FOLATE, FERRITIN, TIBC, IRON, RETICCTPCT in the last 72 hours. Sepsis Labs: No results for input(s): PROCALCITON, LATICACIDVEN in the last 168 hours.  Recent Results (from the past 240 hour(s))  Culture, blood (single)     Status: None   Collection Time: 06/15/18  5:40 AM  Result Value Ref Range Status   Specimen Description BLOOD RIGHT HAND  Final   Special Requests   Final    BOTTLES DRAWN AEROBIC ONLY Blood Culture adequate volume   Culture   Final    NO GROWTH 5 DAYS Performed at [facility], [address], [place], [place] [address]    Report Status 06/20/2018 FINAL  Final  Acid Fast Smear (AFB)     Status: None   Collection Time: 06/17/18  3:37 PM  Result Value Ref Range Status   AFB Specimen Processing Concentration  Final   Acid Fast Smear Negative  Final    Comment: (NOTE) Performed At: [facility] [place], [place] [id number] Rush Farmer MD CM:[id number]    Source (AFB) SPUTUM  Final    Comment: Performed at [facility], [place] [address] [place], [place] [address]  Expectorated sputum assessment w rflx to resp cult     Status: None   Collection Time: 06/17/18  3:37 PM  Result Value Ref Range Status   Specimen Description EXPECTORATED SPUTUM  Final   Special Requests NONE  Final   Sputum evaluation   Final    THIS SPECIMEN IS ACCEPTABLE FOR SPUTUM CULTURE Performed at [facility], [place] [address], [place], [place] [address]    Report Status 06/17/2018 FINAL  Final  Culture, respiratory     Status: None   Collection Time: 06/17/18  3:37 PM  Result Value Ref Range Status   Specimen Description EXPECTORATED SPUTUM  Final   Special Requests NONE Reflexed from [id number]  Final   Gram Stain   Final    FEW WBC PRESENT,BOTH PMN AND MONONUCLEAR NO ORGANISMS SEEN Performed at [facility], [place] [address], [place], [place] [address]     Culture FEW CANDIDA ALBICANS  Final   Report Status 06/19/2018 FINAL  Final  Acid Fast Smear (AFB)     Status: None   Collection Time: 06/18/18  4:40 PM  Result Value Ref Range Status   AFB Specimen Processing Concentration  Final   Acid Fast Smear Negative  Final    Comment: (NOTE) Performed At: [facility] [place], [place] [id number] Rush Farmer MD EO:[id number]    Source (AFB) BRONCHIAL ALVEOLAR LAVAGE  Corrected    Comment: LLL Performed at [facility], [place] [address], [place], [place] [address] CORRECTED ON 05/28 AT 1908: PREVIOUSLY REPORTED AS BRONCHIAL WASHINGS LLL   Fungus Culture With Stain     Status: None   Collection Time: 06/18/18  4:40 PM  Result Value Ref Range Status   Fungus Stain Final report  Final    Comment: (NOTE) Performed At: [facility] [address]  [place], [place] [id number] Rush Farmer MD LK:[id number]    Fungus (Mycology) Culture PENDING  Incomplete   Fungal Source BRONCHIAL ALVEOLAR LAVAGE  Corrected    Comment: LLL Performed at [facility], [place] [address], [place], [place] [address] CORRECTED ON 05/28 AT 1908: PREVIOUSLY REPORTED AS BRONCHIAL WASHINGS LLL   Culture, bal-quantitative     Status: None   Collection Time: 06/18/18  4:40 PM  Result Value Ref Range Status   Specimen Description BRONCHIAL ALVEOLAR LAVAGE LLL  Final   Special Requests NONE  Final   Gram Stain   Final    FEW WBC PRESENT, PREDOMINANTLY PMN NO ORGANISMS SEEN    Culture   Final    NO GROWTH Performed at [facility], [place] [address], [place], [place] [address]    Report Status 06/21/2018 FINAL  Final  Fungus Culture Result     Status: None   Collection Time: 06/18/18  4:40 PM  Result Value Ref Range Status   Result 1 Comment  Final    Comment: (NOTE) KOH/Calcofluor preparation:  no fungus observed. Performed At: [facility] [place], [place] [id number] Rush Farmer MD  LJ:[id number]          Radiology Studies: No results found.      Scheduled Meds: . benzonatate  100 mg Oral TID  . furosemide  80 mg Intravenous BID  . guaiFENesin  600 mg Oral BID  . methocarbamol  500 mg Oral TID  . midazolam  2 mg Intravenous Once  . mineral oil-hydrophilic petrolatum   Topical BID  . senna-docusate  1 tablet Oral BID  . sodium chloride flush  3 mL Intravenous Q12H   Continuous Infusions:   LOS: 15 days     Georgette Shell, MD [facility]   If 7PM-7AM, please contact night-coverage [email or web address] Password [email or web address] 06/24/2018, 1:24 PM

## 2018-06-25 ENCOUNTER — Inpatient Hospital Stay (HOSPITAL_COMMUNITY): Payer: Medicare HMO | Admitting: Anesthesiology

## 2018-06-25 ENCOUNTER — Encounter (HOSPITAL_COMMUNITY): Payer: Self-pay | Admitting: Certified Registered Nurse Anesthetist

## 2018-06-25 ENCOUNTER — Encounter (HOSPITAL_COMMUNITY)
Admission: AD | Disposition: A | Discharge status: Home Health | Payer: Self-pay | Source: Other Acute Inpatient Hospital | Attending: Internal Medicine

## 2018-06-25 HISTORY — PX: VIDEO BRONCHOSCOPY WITH ENDOBRONCHIAL ULTRASOUND: SHX6177

## 2018-06-25 LAB — CBC
HCT: 32.4 % — ABNORMAL LOW (ref 36.0–46.0)
Hemoglobin: 10.8 g/dL — ABNORMAL LOW (ref 12.0–15.0)
MCH: 28.2 pg (ref 26.0–34.0)
MCHC: 33.3 g/dL (ref 30.0–36.0)
MCV: 84.6 fL (ref 80.0–100.0)
Platelets: 695 10*3/uL — ABNORMAL HIGH (ref 150–400)
RBC: 3.83 MIL/uL — ABNORMAL LOW (ref 3.87–5.11)
RDW: 14 % (ref 11.5–15.5)
WBC: 17.3 10*3/uL — ABNORMAL HIGH (ref 4.0–10.5)
nRBC: 0 % (ref 0.0–0.2)

## 2018-06-25 LAB — COMPREHENSIVE METABOLIC PANEL
ALT: 53 U/L — ABNORMAL HIGH (ref 0–44)
AST: 35 U/L (ref 15–41)
Albumin: 1.7 g/dL — ABNORMAL LOW (ref 3.5–5.0)
Alkaline Phosphatase: 175 U/L — ABNORMAL HIGH (ref 38–126)
Anion gap: 13 (ref 5–15)
BUN: 33 mg/dL — ABNORMAL HIGH (ref 8–23)
CO2: 37 mmol/L — ABNORMAL HIGH (ref 22–32)
Calcium: 8.1 mg/dL — ABNORMAL LOW (ref 8.9–10.3)
Chloride: 85 mmol/L — ABNORMAL LOW (ref 98–111)
Creatinine, Ser: 1.18 mg/dL — ABNORMAL HIGH (ref 0.44–1.00)
GFR calc Af Amer: 55 mL/min — ABNORMAL LOW (ref >60)
GFR calc non Af Amer: 47 mL/min — ABNORMAL LOW (ref >60)
Glucose, Bld: 222 mg/dL — ABNORMAL HIGH (ref 70–99)
Potassium: 3.5 mmol/L (ref 3.5–5.1)
Sodium: 135 mmol/L (ref 135–145)
Total Bilirubin: 0.8 mg/dL (ref 0.3–1.2)
Total Protein: 6.9 g/dL (ref 6.5–8.1)

## 2018-06-25 SURGERY — BRONCHOSCOPY, WITH EBUS
Anesthesia: General

## 2018-06-25 MED ORDER — OXYCODONE HCL 5 MG PO TABS: 5.0000 mg | ORAL_TABLET | Freq: Once | ORAL | Status: DC | PRN

## 2018-06-25 MED ORDER — SUCCINYLCHOLINE CHLORIDE 200 MG/10ML IV SOSY
PREFILLED_SYRINGE | INTRAVENOUS | Status: AC
  Filled 2018-06-25: qty 10

## 2018-06-25 MED ORDER — PHENYLEPHRINE 40 MCG/ML (10ML) SYRINGE FOR IV PUSH (FOR BLOOD PRESSURE SUPPORT)
PREFILLED_SYRINGE | INTRAVENOUS | Status: AC
  Filled 2018-06-25: qty 10

## 2018-06-25 MED ORDER — SUCCINYLCHOLINE CHLORIDE 200 MG/10ML IV SOSY
PREFILLED_SYRINGE | INTRAVENOUS | Status: DC | PRN
  Administered 2018-06-25: 100 mg via INTRAVENOUS

## 2018-06-25 MED ORDER — LIDOCAINE 2% (20 MG/ML) 5 ML SYRINGE
INTRAMUSCULAR | Status: DC | PRN
  Administered 2018-06-25: 20 mg via INTRAVENOUS
  Administered 2018-06-25: 40 mg via INTRAVENOUS

## 2018-06-25 MED ORDER — SODIUM CHLORIDE 0.9 % IV SOLN
INTRAVENOUS | Status: DC | PRN
  Administered 2018-06-25: 20 ug/min via INTRAVENOUS

## 2018-06-25 MED ORDER — ROCURONIUM BROMIDE 10 MG/ML (PF) SYRINGE
PREFILLED_SYRINGE | INTRAVENOUS | Status: AC
  Filled 2018-06-25: qty 10

## 2018-06-25 MED ORDER — OXYCODONE HCL 5 MG/5ML PO SOLN: 5.0000 mg | Freq: Once | ORAL | Status: DC | PRN

## 2018-06-25 MED ORDER — DEXAMETHASONE SODIUM PHOSPHATE 10 MG/ML IJ SOLN
INTRAMUSCULAR | Status: AC
  Filled 2018-06-25: qty 1

## 2018-06-25 MED ORDER — LACTATED RINGERS IV SOLN
INTRAVENOUS | Status: DC | PRN
  Administered 2018-06-25: 11:00:00 via INTRAVENOUS

## 2018-06-25 MED ORDER — PROMETHAZINE HCL 25 MG/ML IJ SOLN: 6.2500 mg | INTRAMUSCULAR | Status: DC | PRN

## 2018-06-25 MED ORDER — SUGAMMADEX SODIUM 200 MG/2ML IV SOLN
INTRAVENOUS | Status: DC | PRN
  Administered 2018-06-25: 200 mg via INTRAVENOUS

## 2018-06-25 MED ORDER — PHENYLEPHRINE 40 MCG/ML (10ML) SYRINGE FOR IV PUSH (FOR BLOOD PRESSURE SUPPORT)
PREFILLED_SYRINGE | INTRAVENOUS | Status: DC | PRN
  Administered 2018-06-25: 80 ug via INTRAVENOUS
  Administered 2018-06-25: 120 ug via INTRAVENOUS
  Administered 2018-06-25: 80 ug via INTRAVENOUS

## 2018-06-25 MED ORDER — PROPOFOL 1000 MG/100ML IV EMUL
INTRAVENOUS | Status: AC
  Filled 2018-06-25: qty 100

## 2018-06-25 MED ORDER — ONDANSETRON HCL 4 MG/2ML IJ SOLN
INTRAMUSCULAR | Status: DC | PRN
  Administered 2018-06-25: 4 mg via INTRAVENOUS

## 2018-06-25 MED ORDER — DEXAMETHASONE SODIUM PHOSPHATE 10 MG/ML IJ SOLN
INTRAMUSCULAR | Status: DC | PRN
  Administered 2018-06-25: 10 mg via INTRAVENOUS

## 2018-06-25 MED ORDER — ACETAMINOPHEN 10 MG/ML IV SOLN: 1000.0000 mg | Freq: Once | INTRAVENOUS | Status: DC | PRN

## 2018-06-25 MED ORDER — ONDANSETRON HCL 4 MG/2ML IJ SOLN
INTRAMUSCULAR | Status: AC
  Filled 2018-06-25: qty 6

## 2018-06-25 MED ORDER — FENTANYL CITRATE (PF) 100 MCG/2ML IJ SOLN
INTRAMUSCULAR | Status: DC | PRN
  Administered 2018-06-25: 100 ug via INTRAVENOUS

## 2018-06-25 MED ORDER — PROPOFOL 10 MG/ML IV BOLUS
INTRAVENOUS | Status: DC | PRN
  Administered 2018-06-25: 180 mg via INTRAVENOUS
  Administered 2018-06-25: 60 mg via INTRAVENOUS

## 2018-06-25 MED ORDER — MIDAZOLAM HCL 2 MG/2ML IJ SOLN
INTRAMUSCULAR | Status: AC
  Filled 2018-06-25: qty 2

## 2018-06-25 MED ORDER — ALBUTEROL SULFATE HFA 108 (90 BASE) MCG/ACT IN AERS
INHALATION_SPRAY | RESPIRATORY_TRACT | Status: DC | PRN
  Administered 2018-06-25 (×2): 6 via RESPIRATORY_TRACT

## 2018-06-25 MED ORDER — FENTANYL CITRATE (PF) 250 MCG/5ML IJ SOLN
INTRAMUSCULAR | Status: AC
  Filled 2018-06-25: qty 5

## 2018-06-25 MED ORDER — ROCURONIUM BROMIDE 100 MG/10ML IV SOLN
INTRAVENOUS | Status: DC | PRN
  Administered 2018-06-25: 40 mg via INTRAVENOUS

## 2018-06-25 MED ORDER — LACTATED RINGERS IV SOLN: INTRAVENOUS | Status: DC

## 2018-06-25 MED ORDER — PROPOFOL 500 MG/50ML IV EMUL
INTRAVENOUS | Status: DC | PRN
  Administered 2018-06-25: 50 ug/kg/min via INTRAVENOUS

## 2018-06-25 MED ORDER — LIDOCAINE 2% (20 MG/ML) 5 ML SYRINGE
INTRAMUSCULAR | Status: AC
  Filled 2018-06-25: qty 15

## 2018-06-25 MED ORDER — MIDAZOLAM HCL 5 MG/5ML IJ SOLN
INTRAMUSCULAR | Status: DC | PRN
  Administered 2018-06-25: 1 mg via INTRAVENOUS

## 2018-06-25 MED ORDER — 0.9 % SODIUM CHLORIDE (POUR BTL) OPTIME
TOPICAL | Status: DC | PRN
  Administered 2018-06-25: 12:00:00 1000 mL

## 2018-06-25 MED ORDER — FENTANYL CITRATE (PF) 100 MCG/2ML IJ SOLN: 25.0000 ug | INTRAMUSCULAR | Status: DC | PRN

## 2018-06-25 SURGICAL SUPPLY — 34 items
ADAPTER VALVE BIOPSY EBUS (MISCELLANEOUS) IMPLANT
ADPTR VALVE BIOPSY EBUS (MISCELLANEOUS)
BRUSH CYTOL CELLEBRITY 1.5X140 (MISCELLANEOUS) IMPLANT
CANISTER SUCT 3000ML PPV (MISCELLANEOUS) ×3 IMPLANT
CONT SPEC 4OZ CLIKSEAL STRL BL (MISCELLANEOUS) ×3 IMPLANT
COVER BACK TABLE 60X90IN (DRAPES) ×3 IMPLANT
FORCEPS BIOP RJ4 1.8 (CUTTING FORCEPS) IMPLANT
GAUZE SPONGE 4X4 12PLY STRL (GAUZE/BANDAGES/DRESSINGS) ×2 IMPLANT
GLOVE BIOGEL PI IND STRL 6.5 (GLOVE) IMPLANT
GLOVE BIOGEL PI INDICATOR 6.5 (GLOVE) ×8
GLOVE SURG SIGNA 7.5 PF LTX (GLOVE) ×3 IMPLANT
GOWN STRL REUS W/ TWL LRG LVL3 (GOWN DISPOSABLE) IMPLANT
GOWN STRL REUS W/TWL LRG LVL3 (GOWN DISPOSABLE) ×2
KIT CLEAN ENDO COMPLIANCE (KITS) ×8 IMPLANT
KIT TURNOVER KIT B (KITS) ×3 IMPLANT
MARKER SKIN DUAL TIP RULER LAB (MISCELLANEOUS) ×3 IMPLANT
NDL ASPIRATION VIZISHOT 19G (NEEDLE) IMPLANT
NDL ASPIRATION VIZISHOT 21G (NEEDLE) ×1 IMPLANT
NEEDLE ASPIRATION VIZISHOT 19G (NEEDLE) IMPLANT
NEEDLE ASPIRATION VIZISHOT 21G (NEEDLE) ×3 IMPLANT
NS IRRIG 1000ML POUR BTL (IV SOLUTION) ×3 IMPLANT
OIL SILICONE PENTAX (PARTS (SERVICE/REPAIRS)) IMPLANT
PAD ARMBOARD 7.5X6 YLW CONV (MISCELLANEOUS) ×6 IMPLANT
SYR 20CC LL (SYRINGE) ×3 IMPLANT
SYR 20ML ECCENTRIC (SYRINGE) ×6 IMPLANT
SYR 5ML LUER SLIP (SYRINGE) ×3 IMPLANT
TOWEL OR 17X24 6PK STRL BLUE (TOWEL DISPOSABLE) ×3 IMPLANT
TRAP SPECIMEN MUCOUS 40CC (MISCELLANEOUS) IMPLANT
TUBE CONNECTING 20'X1/4 (TUBING) ×1
TUBE CONNECTING 20X1/4 (TUBING) ×2 IMPLANT
VALVE BIOPSY  SINGLE USE (MISCELLANEOUS) ×2
VALVE BIOPSY SINGLE USE (MISCELLANEOUS) ×1 IMPLANT
VALVE SUCTION BRONCHIO DISP (MISCELLANEOUS) ×3 IMPLANT
WATER STERILE IRR 1000ML POUR (IV SOLUTION) ×3 IMPLANT

## 2018-06-25 NOTE — Plan of Care (Signed)
  Problem: Safety: Goal: Ability to remain free from injury will improve Outcome: Progressing   Problem: Skin Integrity: Goal: Risk for impaired skin integrity will decrease Outcome: Progressing   

## 2018-06-25 NOTE — Op Note (Signed)
  Name:  [name] MRN:  [id number] DOB:  01/05/1951  PROCEDURE NOTE  Procedure(s): Flexible bronchoscopy [phone]) Brushing [phone]) of the carina Bronchial alveolar lavage (22482) of the LLL Endobronchial biopsy (50037) of the carina & mainstem bronchi Endobronchial ultrasound (04888) Transbronchial needle aspiration (91694) of the station 7 LN   Indications:  Subcarinal lymphadenopathy.  Consent:  Written informed consent was obtained prior to the procedure. The risks of the procedure including coughing, bleeding and the small chance of lung puncture requiring chest tube were discussed in great detail. The benefits & alternatives including serial follow up were also discussed.  Anesthesia:  General endotracheal.  Procedure summary:  Appropriate equipment was assembled.  The patient was  identified as Julia Johns. Interim history obtained and brought to the operating room. Safety timeout was performed. The patient was placed supine on the operating table, airway established and general anesthesia administered by Anesthesia team.   After the appropriate level of anesthesia was assured, flexible video bronchoscope was lubricated and inserted through the endotracheal tube.    Airway examination was performed bilaterally to subsegmental level.  Minimal clear secretions were noted, mucosa appeared 'lumpy-bumpy' over carina and proximal mainstem bronchi.N`o endobronchial lesions were identified.  Endobronchial ultrasound video bronchoscope was then lubricated and inserted through the endotracheal tube. Surveillance of the mediastinal and and bilateral hilar lymph node stations was performed.  Pathologically enlarged lymph nodes were noted at station 7  Endobronchial ultrasound guided transbronchial needle aspiration of station 7/subcarinal (passes 7)was performed, after which EBUS bronchoscope was withdrawn.  Flexible video bronchoscope was used again to perform  brushings and random endobronchial mucosal biopsies of carina and both proximal mainstem bronchi.  BAL was obtained from left lower lobe.  After ensuring hemostasis , the bronchoscope was withdrawn.  The patient was extubated in operating room and transferred to PACU.   Specimens sent: Bronchial alveolar lavage specimen of the left lower lobe for  microbiology and cytology. Bronchial brushings Endobronchial biopsy Transbronchial needle aspiration  Complications:  No immediate complications were noted.  Hemodynamic parameters and oxygenation remained stable throughout the procedure.  There was a right mainstem intubation -oxygenation improved after ET tube was withdrawn  Estimated blood loss:  Less then 5 mL.   Kara Mead MD. [place] Flood. [place] Pager [phone] If no response call [phone]   06/25/2018 1:23 PM

## 2018-06-25 NOTE — Progress Notes (Signed)
PT Cancellation Note  Patient Details Name: Julia Johns MRN: [id number] DOB: [birth year]   Cancelled Treatment:    Reason Eval/Treat Not Completed: Patient at procedure or test/unavailable   Shary Decamp [facility] 06/25/2018, 10:21 AM [place] Pager [phone] Office [phone]

## 2018-06-25 NOTE — Anesthesia Postprocedure Evaluation (Signed)
Anesthesia Post Note  Patient: Julia Johns  Procedure(s) Performed: VIDEO BRONCHOSCOPY WITH ENDOBRONCHIAL ULTRASOUND (N/A )     Patient location during evaluation: PACU Anesthesia Type: General Level of consciousness: awake and alert Pain management: pain level controlled Vital Signs Assessment: post-procedure vital signs reviewed and stable Respiratory status: spontaneous breathing, nonlabored ventilation and respiratory function stable Cardiovascular status: blood pressure returned to baseline and stable Postop Assessment: no apparent nausea or vomiting Anesthetic complications: no    Last Vitals:  Vitals:   06/25/18 1315 06/25/18 1330  BP:  (!) 105/58  Pulse: 90 89  Resp: (!) 28 (!) 29  Temp:    SpO2: 100% 95%    Last Pain:  Vitals:   06/25/18 1330  TempSrc:   PainSc: 0-No pain                 Brennan Bailey

## 2018-06-25 NOTE — Progress Notes (Signed)
PROGRESS NOTE    Julia Johns  QZR:[id number] DOB: 1950/09/04 DOA: 06/09/2018 PCP: Patient, No Pcp Per  Brief Cathren Harsh y.o.femalewithout significantmedical history presenting withSOB, blood-tinged sputum, CPas a transfer from [facility].She saw her PCP on last Monday. She presented to [facility] 4 days ago for these symptoms and evaluation including MRI of the T-spine showed aparaspinal soft tissue thickening T6-10 concerning for malignancy. Teleneurology was consulted and the patient was recommended to be admitted with plan for consultsat that time with ortho, neurosurgery, andID; MRI spine; PET scan. The patient declined to stay and requested outpatient f/u. Now with worsening SOB, does not feel like she can manage this outpatient and so she returned to [facility]. Vitals 89% on RA, improved to 93% on 2L [place] O2.PE unremarkable at [facility]. Labs unremarkable other than CRP 180 (normal <10 on their scale). Pre-BNP 142 ->981. Negative troponin, stable EKG. CTA negative for PE but "prevertebral densities" - malignancy vs. infection - and LLL density that is 4.5 x 2.9 cm concerning for mass with surrounding infiltrate.[facility]tinged sputum and [facility]. Repeat COVID test negative(initial was alsonegative).The patient reports significant pain in the left thoracic back region and midsternal chest tightness. She has SOB and cough with hemoptysis. No apparent fever. No tobacco history.   I assumed care of the patient 06/24/2018 patient sitting up in chair on 3 L of oxygen updated family members.  Used interpreter system. Assessment & Plan:  Assessment & Plan:   Principal Problem:   Lobar pneumonia ([place]) Active Problems:   Paravertebral mass   Mass of lower lobe of left lung   Colitis   Acute respiratory failure with hypoxia (HCC)   Bilateral lower extremity edema   Obesity (BMI 30.0-34.9)    Acute hypoxemic respiratory  failure/left lower lobe rounded density, community-acquired pneumonia, right upper lobe pulmonary nodule -COVID was negative at the outside facility x2 -QuantiFERON-TB gold is indeterminate -Sputum cultures as well as AFB smear negative -Pulmonology and infectious disease consulted and appreciated -ESR 124, CRP 38.1 -ANCA titers c-ANCA1:80, p ANCA negative, GBM titers negative -HIV antibody negative -MRSA PCR negative -Status post bronchoscopy on 06/18/2018, preliminary results are concerning for malignancy -Continue antitussives -Antibiotics (vancomycin, merrem) were discontinued after initial bronc pathology reports -Pending official pathology report -will add on tussionex as patient continues to complain of painful cough -oxygen saturations at rest 91% on room air, with ambulation 86%. Patient will need home oxygen.  Patient is now on 4 L of oxygen saturation 95%.  Will titrate to keep it above 92%. S/p flexible bronch brushing of carina, bronchoalveolar lavage of left lower lobe endobronchial biopsy of the carina and mainstem bronchus transbronchial needle aspiration.  Paraspinal fluid collection -Neurosurgery, neurology, CT surgery, consulted and appreciated, currently no intervention  Bilateral proximal muscle weakness -Upper and lower extremities -Has been ongoing for the last several days -? PMR or other inflammatory condition, paraneoplastic syndrome -Supportive care -Physical therapynow recommending HHPT patient is extremely weak will reconsult physical therapy to see if she would benefit from SNF rehabilitation.  Acute diastolic heart failure/ Lower extremity edema -Echocardiogram EF 35-45%, LV diastolic parameters normal, no RWMA -Extremity Dopplers negative for DVT -Currently on IV Lasix 80 mg twice a day -Continue to monitor intake and output, weights -Will consult would care- TED hose removed today, small blistering noted -patient cannot elevated her legs due to  increased pressure and feels she cannot breathe -Wound care concerned about purplish lesion concerning for malignancy or infectious etiology  recommending ID consult.  Discussed with ID   Abdominal pain, with nausea and vomiting, sigmoid colitis -CT abdomen pelvis on 06/14/2018: evidence of sigmoid colitis without perforation or abscess -C. difficile was ordered but not sent as patient was not having any further diarrhea -Abdominal symptoms seem to have resolved  Obesity -BMI 32.51 -Patient to follow-up with PCP regarding lifestyle modifications  Generalized weakness -PT recommending HH - rolling walker, 3in 1  Constipation -suppository ordered  DVT ProphylaxisSCDs  Code Status:Full  Family Communication:  Called daughter-in-law today did not answer the phone  Disposition Plan:Admitted. Pending improvement in respiratory status  Dispo home with home health.  Consultants Infectious disease PCCM Cardiothoracic surgery Neurology Neurosurgery  Procedures  Echocardiogram LE doppler Estimated body mass index is 31.42 kg/m as calculated from the following:   Height as of this encounter: _0  (1.702 m).   Weight as of this encounter: 91 kg.   Subjective: Overnight patient had an episode of fall where she could not hold her knees straight denied any prodromal symptoms no chest pain headaches otherwise she has no complaints this morning.  Objective: Vitals:   06/25/18 0738 06/25/18 1300 06/25/18 1315 06/25/18 1330  BP: 94/62   (!) 105/58  Pulse: 85 93 90 89  Resp: (!) 32 17 (!) 28 (!) 29  Temp: 98.7 F (37.1 C) (!) 97 F (36.1 C)    TempSrc: Axillary     SpO2: 94% 98% 100% 95%  Weight:      Height:        Intake/Output Summary (Last 24 hours) at 06/25/2018 1403 Last data filed at 06/25/2018 1300 Gross per 24 hour  Intake 900 ml  Output --  Net 900 ml   Filed Weights   06/22/18 0655 06/24/18 0345 06/25/18 0401  Weight: 95 kg 90.9 kg 91 kg     Examination:  General exam: Appears calm and comfortable  Respiratory system: Clear to auscultation. Respiratory effort normal. Cardiovascular system: S1 & S2 heard, RRR. No JVD, murmurs, rubs, gallops or clicks. No pedal edema. Gastrointestinal system: Abdomen is distended, soft and nontender. No organomegaly or masses felt. Normal bowel sounds heard. Central nervous system: Alert and oriented. No focal neurological deficits. Extremities.  2+ pitting edema both legs covered with dressings saturated with fluid. Skin: No rashes, lesions or ulcers Psychiatry: Judgement and insight appear normal. Mood & affect appropriate.     Data Reviewed: I have personally reviewed following labs and imaging studies  CBC: Recent Labs  Lab 06/19/18 0650 06/22/18 0648 06/23/18 0451 06/24/18 0451  WBC 13.8* 19.1* 19.1* 17.5*  NEUTROABS 12.2* 17.1*  --   --   HGB 10.5* 10.7* 10.9* 10.5*  HCT 32.9* 33.7* 34.1* 32.3*  MCV 87.0 86.9 86.8 85.2  PLT 633* 683* 639* 865*   Basic Metabolic Panel: Recent Labs  Lab 06/19/18 0650 06/22/18 0648 06/23/18 0451 06/24/18 0451  NA 137 131* 132* 131*  K 3.4* 3.8 3.7 3.6  CL 88* 83* 83* 81*  CO2 35* 35* 35* 35*  GLUCOSE 212* 202* 175* 161*  BUN 18 23 29* 32*  CREATININE 0.52 0.80 0.83 0.91  CALCIUM 8.2* 8.3* 8.3* 8.5*   GFR: Estimated Creatinine Clearance: 68.6 mL/min (by C-G formula based on SCr of 0.91 mg/dL). Liver Function Tests: Recent Labs  Lab 06/19/18 0650 06/22/18 0648  AST 104* 101*  ALT 71* 119*  ALKPHOS 206* 255*  BILITOT 0.6 0.8  PROT 6.5 7.1  ALBUMIN 1.6* 1.7*   No results for input(s):  LIPASE, AMYLASE in the last 168 hours. No results for input(s): AMMONIA in the last 168 hours. Coagulation Profile: No results for input(s): INR, PROTIME in the last 168 hours. Cardiac Enzymes: No results for input(s): CKTOTAL, CKMB, CKMBINDEX, TROPONINI in the last 168 hours. BNP (last 3 results) No results for input(s): PROBNP in the  last 8760 hours. HbA1C: No results for input(s): HGBA1C in the last 72 hours. CBG: No results for input(s): GLUCAP in the last 168 hours. Lipid Profile: No results for input(s): CHOL, HDL, LDLCALC, TRIG, CHOLHDL, LDLDIRECT in the last 72 hours. Thyroid Function Tests: No results for input(s): TSH, T4TOTAL, FREET4, T3FREE, THYROIDAB in the last 72 hours. Anemia Panel: No results for input(s): VITAMINB12, FOLATE, FERRITIN, TIBC, IRON, RETICCTPCT in the last 72 hours. Sepsis Labs: No results for input(s): PROCALCITON, LATICACIDVEN in the last 168 hours.  Recent Results (from the past 240 hour(s))  Acid Fast Smear (AFB)     Status: None   Collection Time: 06/17/18  3:37 PM  Result Value Ref Range Status   AFB Specimen Processing Concentration  Final   Acid Fast Smear Negative  Final    Comment: (NOTE) Performed At: [facility] [place], [place] [id number] Rush Farmer MD [id number]    Source (AFB) SPUTUM  Final    Comment: Performed at [facility], [place] [address], [place], [place] [address]  Expectorated sputum assessment w rflx to resp cult     Status: None   Collection Time: 06/17/18  3:37 PM  Result Value Ref Range Status   Specimen Description EXPECTORATED SPUTUM  Final   Special Requests NONE  Final   Sputum evaluation   Final    THIS SPECIMEN IS ACCEPTABLE FOR SPUTUM CULTURE Performed at [facility], [place] [address], [place], [place] [address]    Report Status 06/17/2018 FINAL  Final  Culture, respiratory     Status: None   Collection Time: 06/17/18  3:37 PM  Result Value Ref Range Status   Specimen Description EXPECTORATED SPUTUM  Final   Special Requests NONE Reflexed from [id number]  Final   Gram Stain   Final    FEW WBC PRESENT,BOTH PMN AND MONONUCLEAR NO ORGANISMS SEEN Performed at [facility], [place] [address], [place], [place] [address]    Culture FEW CANDIDA ALBICANS  Final   Report Status 06/19/2018 FINAL  Final   Acid Fast Smear (AFB)     Status: None   Collection Time: 06/18/18  4:40 PM  Result Value Ref Range Status   AFB Specimen Processing Concentration  Final   Acid Fast Smear Negative  Final    Comment: (NOTE) Performed At: [facility] [place], [place] [id number] Rush Farmer MD [id number]    Source (AFB) BRONCHIAL ALVEOLAR LAVAGE  Corrected    Comment: LLL Performed at [facility], [place] [address], [place], [place] [address] CORRECTED ON 05/28 AT 1908: PREVIOUSLY REPORTED AS BRONCHIAL WASHINGS LLL   Fungus Culture With Stain     Status: None   Collection Time: 06/18/18  4:40 PM  Result Value Ref Range Status   Fungus Stain Final report  Final    Comment: (NOTE) Performed At: [facility] [place], [place] [id number] Rush Farmer MD [id number]    Fungus (Mycology) Culture PENDING  Incomplete   Fungal Source BRONCHIAL ALVEOLAR LAVAGE  Corrected    Comment: LLL Performed at [facility], [place]  [address], [place], [place] [address] CORRECTED ON 05/28 AT 1908: PREVIOUSLY REPORTED AS BRONCHIAL WASHINGS LLL   Culture, bal-quantitative     Status: None   Collection Time: 06/18/18  4:40 PM  Result Value Ref Range Status   Specimen Description BRONCHIAL ALVEOLAR LAVAGE LLL  Final   Special Requests NONE  Final   Gram Stain   Final    FEW WBC PRESENT, PREDOMINANTLY PMN NO ORGANISMS SEEN    Culture   Final    NO GROWTH Performed at [facility], [place] [address] [place], [place] [address]    Report Status 06/21/2018 FINAL  Final  Fungus Culture Result     Status: None   Collection Time: 06/18/18  4:40 PM  Result Value Ref Range Status   Result 1 Comment  Final    Comment: (NOTE) KOH/Calcofluor preparation:  no fungus observed. Performed At: [facility] [place], [place] [id number] Rush Farmer MD [id number]          Radiology Studies: Dg Swallowing Func-speech  Pathology  Result Date: 06/24/2018 Objective Swallowing Evaluation: Type of Study: Bedside Swallow Evaluation  Patient Details Name: Julia Johns MRN: [id number] Date of Birth: 17-May-1950 Today's Date: 06/24/2018 Time: SLP Start Time (ACUTE ONLY): 1345 -SLP Stop Time (ACUTE ONLY): 1410 SLP Time Calculation (min) (ACUTE ONLY): 25 min Past Medical History: No past medical history on file. Past Surgical History: Past Surgical History: Procedure Laterality Date  VIDEO BRONCHOSCOPY Bilateral 06/18/2018  Procedure: VIDEO BRONCHOSCOPY WITH FLUORO;  Surgeon: Rigoberto Noel, MD;  Location: [place];  Service: Cardiopulmonary;  Laterality: Bilateral; HPI: This is a 68 year old female who presented as a transfer from [facility] with complaints of shortness of breath and blood-tinged sputum.  Patient underwent CT imaging which revealed a paraspinal soft tissue thickening from T6-T10 as well as a left lower lobe density within the CT which was enlarged from prior imaging concerning for an inflammatory/infectious etiology. Bronchoscopy 5/28. No culture growth. Cytology with atypical cells. No malignancy identified. TB- AFBs neg x 2, Quantiferon TB indeterminate, Covid negative. MD ordered MBS for objective assessment of swallow function.  No data recorded Assessment / Plan / Recommendation CHL IP CLINICAL IMPRESSIONS 06/24/2018 Clinical Impression Pt demonstrates normal swallow function. She could not swallow a barium tablet, could not orally transit despite liquid and puree boluses given, said it was too large. No SLP f/u needed. Continue current diet.  SLP Visit Diagnosis Dysphagia, unspecified (R13.10) Attention and concentration deficit following -- Frontal lobe and executive function deficit following -- Impact on safety and function Mild aspiration risk   CHL IP TREATMENT RECOMMENDATION 06/24/2018 Treatment Recommendations No treatment recommended at this time   No flowsheet data found. CHL IP DIET  RECOMMENDATION 06/24/2018 SLP Diet Recommendations Regular solids;Thin liquid Liquid Administration via Cup;Straw Medication Administration Whole meds with liquid Compensations -- Postural Changes --   No flowsheet data found.  No flowsheet data found.  No flowsheet data found.     CHL IP ORAL PHASE 06/24/2018 Oral Phase WFL Oral - Pudding Teaspoon -- Oral - Pudding Cup -- Oral - Honey Teaspoon -- Oral - Honey Cup -- Oral - Nectar Teaspoon -- Oral - Nectar Cup -- Oral - Nectar Straw -- Oral - Thin Teaspoon -- Oral - Thin Cup -- Oral - Thin Straw -- Oral - Puree -- Oral - Mech Soft -- Oral - Regular -- Oral - Multi-Consistency -- Oral - Pill -- Oral Phase - Comment --  CHL IP  PHARYNGEAL PHASE 06/24/2018 Pharyngeal Phase WFL Pharyngeal- Pudding Teaspoon -- Pharyngeal -- Pharyngeal- Pudding Cup -- Pharyngeal -- Pharyngeal- Honey Teaspoon -- Pharyngeal -- Pharyngeal- Honey Cup -- Pharyngeal -- Pharyngeal- Nectar Teaspoon -- Pharyngeal -- Pharyngeal- Nectar Cup -- Pharyngeal -- Pharyngeal- Nectar Straw -- Pharyngeal -- Pharyngeal- Thin Teaspoon -- Pharyngeal -- Pharyngeal- Thin Cup -- Pharyngeal -- Pharyngeal- Thin Straw -- Pharyngeal -- Pharyngeal- Puree -- Pharyngeal -- Pharyngeal- Mechanical Soft -- Pharyngeal -- Pharyngeal- Regular -- Pharyngeal -- Pharyngeal- Multi-consistency -- Pharyngeal -- Pharyngeal- Pill -- Pharyngeal -- Pharyngeal Comment --  No flowsheet data found. Herbie Baltimore, MA CCC-SLP Acute Rehabilitation Services Pager [phone] Office [phone] Lynann Beaver 06/24/2018, 2:48 PM                   Scheduled Meds:  benzonatate  100 mg Oral TID   furosemide  80 mg Intravenous BID   guaiFENesin  600 mg Oral BID   midazolam  2 mg Intravenous Once   mineral oil-hydrophilic petrolatum   Topical BID   senna-docusate  1 tablet Oral BID   sodium chloride flush  3 mL Intravenous Q12H   Continuous Infusions:   LOS: 16 days     Georgette Shell, MD [facility]  If 7PM-7AM, please contact night-coverage [email or web address] Password [email or web address] 06/25/2018, 2:03 PM

## 2018-06-25 NOTE — Transfer of Care (Signed)
Immediate Anesthesia Transfer of Care Note  Patient: Julia Johns  Procedure(s) Performed: VIDEO BRONCHOSCOPY WITH ENDOBRONCHIAL ULTRASOUND (N/A )  Patient Location: PACU  Anesthesia Type:General  Level of Consciousness: drowsy  Airway & Oxygen Therapy: Patient Spontanous Breathing and Patient connected to face mask oxygen  Post-op Assessment: Report given to RN and Post -op Vital signs reviewed and stable  Post vital signs: Reviewed and stable  Last Vitals:  Vitals Value Taken Time  BP    Temp 36.1 C 06/25/2018  1:00 PM  Pulse 93 06/25/2018  1:00 PM  Resp 17 06/25/2018  1:00 PM  SpO2 98 % 06/25/2018  1:00 PM    Last Pain:  Vitals:   06/25/18 1300  TempSrc:   PainSc: 0-No pain      Patients Stated Pain Goal: 3 (60/63/01 6010)  Complications: No apparent anesthesia complications

## 2018-06-25 NOTE — Care Management Important Message (Signed)
Important Message  Patient Details  Name: Julia Johns MRN: [id number] Date of Birth: May 30, 1950   Medicare Important Message Given:  Yes    [name] 06/25/2018, 1:34 PM

## 2018-06-25 NOTE — Progress Notes (Signed)
Was the fall witnessed: no  Patient condition before and after the fall: Stable before the fall;unchanged after the fall.  Patient's reaction to the fall: Via interpreter,pt stated that she was not hurt;denied pain.   Name of the doctor that was notified including date and time: K.Schorr. 0044  Any interventions and vital signs: BP 97/56,P 85, RR 25, O2 sat 96%4L [place]. Yellow bracelet placed on patient; Bed alarm turned on.

## 2018-06-25 NOTE — Interval H&P Note (Signed)
History and Physical Interval Note:  06/25/2018 10:58 AM  Julia Johns  has presented today for surgery, with the diagnosis of LUNG MASS.  The various methods of treatment have been discussed with the patient and family. After consideration of risks, benefits and other options for treatment, the patient has consented to  Procedure(s): Lluveras (N/A) as a surgical intervention.  The patient's history has been reviewed, patient examined, no change in status, stable for surgery.  I have reviewed the patient's chart and labs.  Questions were answered to the patient's satisfaction.     Leanna Sato [name]

## 2018-06-25 NOTE — Anesthesia Procedure Notes (Signed)
Procedure Name: Intubation Date/Time: 06/25/2018 11:45 AM Performed by: Candis Shine, CRNA Pre-anesthesia Checklist: Patient identified, Emergency Drugs available, Suction available and Patient being monitored Patient Re-evaluated:Patient Re-evaluated prior to induction Oxygen Delivery Method: Circle System Utilized Preoxygenation: Pre-oxygenation with 100% oxygen Induction Type: IV induction Ventilation: Mask ventilation without difficulty and Oral airway inserted - appropriate to patient size Laryngoscope Size: Glidescope and 3 Grade View: Grade I Tube type: Oral Tube size: 8.5 mm Number of attempts: 2 Airway Equipment and Method: Video-laryngoscopy and Rigid stylet Placement Confirmation: ETT inserted through vocal cords under direct vision,  positive ETCO2 and breath sounds checked- equal and bilateral Secured at: 22 cm Tube secured with: Tape Dental Injury: Teeth and Oropharynx as per pre-operative assessment  Comments: Attempt x 1 by CRNA with Mac 3, grade I view. Unable to pass 8.5 ETT. 2nd attempt by Dr. Daiva Huge with glidescope 3, grade I view. 8.5 ETT passed successfully but noted to be tight.

## 2018-06-26 ENCOUNTER — Encounter (HOSPITAL_COMMUNITY): Payer: Self-pay | Admitting: Pulmonary Disease

## 2018-06-26 MED ORDER — METHOCARBAMOL 500 MG PO TABS: 500.0000 mg | ORAL_TABLET | Freq: Three times a day (TID) | ORAL | 0 refills | Status: DC | PRN

## 2018-06-26 MED ORDER — FUROSEMIDE 40 MG PO TABS: 40.0000 mg | ORAL_TABLET | Freq: Two times a day (BID) | ORAL | 11 refills | Status: DC

## 2018-06-26 MED ORDER — POTASSIUM CHLORIDE ER 10 MEQ PO TBCR: 20.0000 meq | EXTENDED_RELEASE_TABLET | Freq: Two times a day (BID) | ORAL | 0 refills | Status: DC

## 2018-06-26 MED ORDER — BISACODYL 10 MG RE SUPP: 10.0000 mg | Freq: Every day | RECTAL | 0 refills | Status: DC | PRN

## 2018-06-26 MED ORDER — SENNOSIDES-DOCUSATE SODIUM 8.6-50 MG PO TABS: 1.0000 | ORAL_TABLET | Freq: Two times a day (BID) | ORAL | Status: DC

## 2018-06-26 NOTE — Progress Notes (Signed)
Called pts son to make aware of mothers discharge no answer and no voice mail set up

## 2018-06-26 NOTE — Discharge Instructions (Signed)
Neumona extrahospitalaria en los adultos Community-Acquired Pneumonia, Adult La neumona es una infeccin en los pulmones. Produce hinchazn en las vas respiratorias de los pulmones. Tambin puede acumularse mucosidad y lquido en el interior de las vas respiratorias. Un tipo de neumona puede suceder [employer] persona est en un hospital. El otro tipo de la enfermedad puede suceder cuando la persona no est en un hospital (neumona extrahospitalaria).  Cules son las causas?  La causa de esta afeccin son los microbios (virus, bacterias u hongos). Algunos tipos de microbios pueden transmitirse de Ardelia Mems persona a Theatre manager. Esto puede suceder cuando usted aspira las gotitas que una persona infectada elimina al toser o Brewing technologist. Qu incrementa el riesgo? Es ms probable que tenga esta afeccin si:  Tiene una enfermedad a largo plazo (crnica), como las siguientes: ? Enfermedad pulmonar obstructiva crnica (EPOC). ? Asma. ? Fibrosis qustica. ? Insuficiencia cardaca congestiva. ? Diabetes. ? Enfermedad renal.  Tiene VIH.  Tienen anemia drepanoctica.  Les extirparon Wellsite geologist.  No se cuida bien la boca y los dientes (higiene dental deficiente).  Tiene una afeccin que incrementa el riesgo de respirar las secreciones de la propia boca o Lawyer.  Tiene debilitado el sistema de defensa del organismo (sistema inmunitario).  Es fumador.  Viaja a zonas donde los microbios que causan esta enfermedad son frecuentes.  Est cerca de ciertos animales o de los lugares donde viven. Cules son los signos o los sntomas?  Tos seca.  Tos hmeda (productiva).  Cristy Hilts.  Sudoracin.  Dolor en el pecho. Este suele sentirse al respirar profundamente o toser.  Respiracin rpida o dificultad para respirar.  Falta de aire.  Escalofros.  Sensacin de cansancio (fatiga).  Dolores musculares. Cmo se trata? El tratamiento de esta afeccin depende de muchos factores. La [employer] de los  adultos puede recibir Paediatric nurse. En algunos casos, el tratamiento debe realizarse en un hospital. El tratamiento puede incluir:  Medicamentos que se dan por boca o a travs de un tubo intravenoso.  Suministro de oxgeno adicional.  Estate manager/land agent. En raros casos, el tratamiento de la neumona muy grave puede incluir lo siguiente:  Usar una mquina para ayudarlo a Ambulance person.  Someterse a un procedimiento para extraer lquido de alrededor [employer]. Siga estas instrucciones en su casa: Medicamentos  [employer] de venta libre y los recetados solamente como se lo haya indicado el mdico. ? Solo tome medicamentos para la tos si no puede dormir bien.  Si le recetaron un antibitico, tmelo como se lo haya indicado el mdico. No deje de tomar el antibitico, aunque comience a sentirse mejor. Instrucciones generales   Duerma con la cabeza y el cuello levantados (elevados). Para lograrlo, duerma en una reposera o pngase algunas almohadas debajo de la cabeza.  Descanse todo lo que sea necesario. Duerma como mnimo 8 horas todas las noches.  Beba suficiente agua para mantener el pis (la [place]) de color amarillo plido.  Siga una dieta saludable que incluya abundantes frutas, verduras, cereales integrales, productos lcteos descremados y protenas magras.  No consuma ningn producto que contenga nicotina o tabaco. Estos incluyen cigarrillos, cigarrillos electrnicos y tabaco para Higher education careers adviser. Si necesita ayuda para dejar de fumar, consulte al mdico.  Concurra a todas las visitas de seguimiento como se lo haya indicado el mdico. Esto es importante. Cmo se evita? Una vacuna puede ayudar a prevenir la neumona. Habitualmente, se recomienda para:  Las [employer] American de 55 aos.  Las [employer] de 19 aos que: ?  Estn recibiendo tratamiento para Science writer. ? Tienen una enfermedad pulmonar a largo plazo (crnica). ? Tienen problemas con el sistema de  defensa del cuerpo. Tambin puede evitar contraer neumona si toma estas medidas:  Se vacuna contra la gripe todos los aos.  Va al dentista con la frecuencia que le hayan indicado.  Lvese las manos con frecuencia. Use un desinfectante para manos si no dispone de [place] y [place]. Comunquese con un mdico si:  Tiene fiebre.  No puede dormir porque el medicamento para la tos no lo ayuda. Solicite ayuda inmediatamente si:  Tiene falta de aire y Zenaida Deed.  Aumenta el dolor en el pecho.  La enfermedad empeora. Esto es muy grave si usted: ? Es Media planner. ? Tiene debilitado el sistema de defensa del cuerpo.  Tose y escupe sangre. Resumen  La neumona es una infeccin en los pulmones.  La [employer] de los adultos puede recibir Paediatric nurse. Algunos necesitarn tratamiento en un hospital.  Beba suficiente agua como para mantener la orina de color amarillo plido.  Duerma como mnimo 8 horas todas las noches. Esta informacin no tiene Marine scientist el consejo del mdico. Asegrese de hacerle al mdico cualquier pregunta que tenga. Document Released: 06/27/2009 Document Revised: 09/30/2017 Document Reviewed: 09/30/2017 Elsevier Interactive Patient Education  2019 [employer] en los adultos Community-Acquired Pneumonia, Adult Introduccin La neumona es una infeccin en los pulmones. Hay diferentes tipos de neumona. Uno de ellos se desarrolla cuando una persona est en un hospital. Otro, llamado neumona extrahospitalaria, se desarrolla en personas que no estn, ni han Armed forces operational officer, en un hospital u otro centro de atencin de la salud. Cules son las causas?  Bacterias, virus u hongos podran ser la causa de la neumona. Con frecuencia, la neumona extrahospitalaria es provocada por la bacteria Streptococcus pneumoniae. Estas bacterias suelen contagiarse de [place] persona a otra al respirar gotitas de la tos o de un  estornudo de una persona infectada. Qu incrementa el riesgo? Es ms probable que Orthoptist en las siguientes poblaciones:  Personas con enfermedades crnicas, como enfermedad pulmonar obstructiva crnica(EPOC), asma, insuficiencia cardaca congestiva, fibrosis qustica, diabetes o enfermedad renal.  Personas con fase temprana o avanzada de VIH.  Personas con anemia drepanoctica.  Personas a quienes les han extrado el bazo(esplenectoma).  Personas cuya higiene dental es deficiente.  Personas con afecciones que incrementan el riesgo de respirar(aspirar) secreciones por la boca o la nariz.  Personas con el sistema inmunitario debilitado(inmunocomprometidas).  Fumadores.  Personas que viajan a lugares donde frecuentemente hay microbios que causan neumona.  Personas que suelen estar alrededor de animales o de hbitats de animales que tienen microbios que causan neumona; por ejemplo, pjaros, murcilagos, conejos, gatos y animales de [place]. Cules son los signos o sntomas? Los sntomas de esta afeccin [employer] siguientes:  Tos seca.  Tos hmeda (productiva).  Cristy Hilts.  Sudoracin.  Dolor en el pecho; en especial, al respirar profundamente o toser.  Respiracin rpida o dificultad para respirar.  Falta de aire.  Escalofros.  Fatiga.  Dolores musculares. Cmo se diagnostica? El mdico le har una historia clnica y un examen fsico. Tambin pueden hacerle otros estudios, por ejemplo:  Estudios de diagnstico por imgenes del pecho, incluidas radiografas.  Pruebas para controlar el nivel de oxgeno en la sangre y otros gases sanguneos.  Otras pruebas de la sangre, la mucosidad(esputo), el lquido que rodea los pulmones(lquido pleural) y Higher education careers adviser. Si la neumona es  grave, se podran realizar otros estudios para identificar la causa especfica de la enfermedad. Cmo se trata? El tipo de tratamiento que reciba depender de muchos  factores; por ejemplo, la causa de la neumona, los medicamentos que toma y otras afecciones que tenga. En la [employer], el tratamiento contra la neumona y la recuperacin despus de esta podran Press photographer. En algunos casos, el tratamiento debe realizarse en un hospital. El tratamiento puede incluir lo siguiente:  Antibiticos, si la neumona fue provocada por una bacteria.  Medicamentos antivirales, si la neumona fue provocada por un virus.  Medicamentos que se administran por boca o a travs de un tubo (catter) intravenoso.  Oxgeno.  Terapia respiratoria. A pesar de que es infrecuente, el tratamiento contra la neumona grave podra incluir lo siguiente:  Teacher, adult education. Se realiza si no respira bien por sus propios medios y, por lo tanto, no puede mantener un nivel seguro de oxgeno Patent attorney.  Toracocentesis. A travs de este procedimiento, se extrae el lquido que rodea uno o ambos pulmones para ayudarlo a Fish farm manager. Siga estas indicaciones en su casa:  Tome los medicamentos de venta libre y los recetados solamente como se lo haya indicado el mdico. ? Solo tome medicamentos para la tos si no puede dormir bien. Debe tener en cuenta que los medicamentos para la tos pueden hacer que el cuerpo no sea capaz de Radiographer, therapeutic, de forma natural, la mucosidad de los pulmones. ? Si le recetaron un antibitico, tmelo como se lo haya indicado el mdico. No deje de tomar el antibitico aunque comience a sentirse mejor.  De noche, duerma semisentado. Intente dormir en un silln reclinable o pngase algunas almohadas debajo de la cabeza.  No consuma productos que contengan tabaco, incluidos cigarrillos, tabaco de Higher education careers adviser y Psychologist, sport and exercise. Si necesita ayuda para dejar de fumar, consulte al mdico.  Beba suficiente agua para mantener la orina clara o de color amarillo plido. Si lo hace, ayudar a diluir la secrecin de mucosidad en los pulmones. Cmo  se evita? De varias formas puede disminuir el riesgo de contraer neumona extrahospitalaria. Considere colocarse la Copy en los siguientes casos:  Es mayor de [phone].  Es mayor de 19aos y est en tratamiento para Science writer, tiene una enfermedad pulmonar crnica o tiene otras afecciones que perjudican el sistema inmunitario. Pregntele al mdico si esto es vlido para su caso. Hay diferentes tipos y esquemas de vacunas antineumoccicas. Pregntele al mdico cul es la vacuna ms [place] para usted. Tambin puede evitar contraer neumona extrahospitalaria si toma estas medidas:  Colquese la vacuna contra la gripe todos los [place]. Pregntele al mdico cul es la vacuna contra la gripe ms adecuada para usted.  Visite al dentista con regularidad.  Lvese las manos con frecuencia. Utilice un desinfectante para manos si no dispone de [place] y [place]. Comunquese con un mdico si:  Tiene fiebre.  No puede dormir porque no es capaz de Aeronautical engineer tos con medicamentos. Solicite ayuda de inmediato si:  Experimenta un empeoramiento en la falta de aire.  El dolor de pecho es cada vez ms intenso.  La enfermedad empeora, especialmente si usted es un adulto mayor o su sistema inmunitario est debilitado.  Tose y escupe sangre. Esta informacin no tiene Marine scientist el consejo del mdico. Asegrese de hacerle al mdico cualquier pregunta que tenga. Document Released: 10/17/2004 Document Revised: 12/23/2016 Document Reviewed: 05/04/2014 Elsevier Interactive Patient Education  2019 [place] abdominal  en adultos Abdominal Pain, Adult El dolor abdominal puede tener muchas causas. A menudo, no es grave y [place] sin tratamiento o con tratamiento en la casa. Sin embargo, a Product/process development scientist abdominal es intenso. El mdico revisar sus antecedentes mdicos y le har un examen fsico para tratar de Office manager causa del dolor abdominal. Siga estas instrucciones en  su casa:  Tome los medicamentos de venta libre y los recetados solamente como se lo haya indicado el mdico. No tome un laxante a menos que se lo haya indicado el mdico.  Beba suficiente lquido para Theatre manager la orina clara o de color amarillo plido.  Controle su afeccin para ver si hay cambios.  Concurra a todas las visitas de control como se lo haya indicado el mdico. Esto es importante. Comunquese con un mdico si:  El dolor abdominal cambia o empeora.  No tiene apetito o baja de peso sin proponrselo.  Est estreido o tiene diarrea durante ms de 2 o 3das.  Tiene dolor cuando orina o defeca.  El dolor abdominal lo despierta de noche.  El dolor empeora con las comidas, despus de comer o con determinados alimentos.  Tiene vmitos y no puede retener nada.  Tiene fiebre. Solicite ayuda de inmediato si:  El dolor no desaparece tan pronto como el mdico le dijo que era esperable.  No puede detener los vmitos.  El Social research officer, government se siente solo en zonas del abdomen, como el lado derecho o la parte inferior izquierda del abdomen.  Las heces son sanguinolentas o de color negro, o de aspecto alquitranado.  Tiene dolor intenso, clicos, o meteorismo en el abdomen.  Tiene signos de deshidratacin, por ejemplo: ? Elmon Else, muy escasa o falta de [place]. ? Labios agrietados. ? [employer]. ? Ojos hundidos. ? Somnolencia. ? Debilidad. Esta informacin no tiene Marine scientist el consejo del mdico. Asegrese de hacerle al mdico cualquier pregunta que tenga. Document Released: 01/07/2005 Document Revised: 12/28/2015 Document Reviewed: 06/21/2015 Elsevier Interactive Patient Education  2019 [place] agudo en los adultos (Acute Pain, Adult) El dolor agudo es un tipo de Social research officer, government que dura solo algunos [place] o tanto como seis meses. A menudo se lo relaciona con una enfermedad, una lesin o un procedimiento mdico. El dolor agudo puede ser leve, moderado o intenso.  Generalmente desaparece una vez que la lesin se ha curado o que el paciente ya no est enfermo. El dolor puede dificultar las actividades cotidianas. Puede causar ansiedad y derivar en otros problemas si no se lo trata. El tratamiento depende de la causa y de la intensidad del dolor agudo. INSTRUCCIONES PARA EL CUIDADO EN EL HOGAR  Contrlese el nivel del dolor como se lo haya indicado el mdico.  Tome los medicamentos de venta libre y los recetados solamente como se lo haya indicado el mdico.  Si est tomando un analgsico recetado: ? Pregntele al mdico si puede tomar un laxante emoliente o un laxante para evitar el estreimiento. ? No deje de tomar el medicamento de manera repentina. Hable con el mdico acerca de cmo y cundo suspender el analgsico recetado. ? Si el dolor es intenso, no tome ms comprimidos que los que le haya indicado el mdico. ? No tome analgsicos de venta libre adems de este medicamento, a menos que se lo haya indicado el mdico. ? No conduzca ni opere maquinaria pesada mientras toma analgsicos recetados.  Aplquese hielo o calor como se lo haya indicado el mdico. Estos pueden reducir la hinchazn  y Best boy.  Pregntele al mdico si otras estrategias, como la distraccin, la relajacin o las fisioterapias pueden English as a second language teacher.  Concurra a todas las visitas de control como se lo haya indicado el mdico. Esto es importante. SOLICITE ATENCIN MDICA SI:  No puede controlar el dolor con medicamentos.  El dolor no mejora o [place].  Los [employer] Corporation causan efectos secundarios, como vmitos o confusin. SOLICITE ATENCIN MDICA DE INMEDIATO SI:  Siente dolor intenso.  Tiene dificultad para respirar.  Pierde la conciencia.  Tiene dolor u opresin en el pecho que duran ms de unos pocos minutos. Junto con el dolor de [place], puede tener lo siguiente: ? Dolor o [employer] o ambos brazos, en la espalda, el cuello, la mandbula o el  [place]. ? Falta de aire. ? Le aparece sudor fro. ? Sentir nuseas. ? Siente que est por desvanecerse. Estos sntomas pueden representar un problema grave que constituye Engineer, maintenance (IT). No espere hasta que los sntomas desaparezcan. Solicite atencin mdica de inmediato. Comunquese con el servicio de emergencias de su localidad (911 en los [place]). No conduzca por sus propios medios [employer] Financial. Esta informacin no tiene Marine scientist el consejo del mdico. Asegrese de hacerle al mdico cualquier pregunta que tenga. Document Released: 09/14/2015 Document Revised: 09/14/2015 Document Reviewed: 01/22/2015 Elsevier Interactive Patient Education  2019 [employer].

## 2018-06-26 NOTE — TOC Transition Note (Signed)
Transition of Care [facility]) - CM/SW Discharge Note   Patient Details  Name: Julia Johns MRN: [id number] Date of Birth: May 26, 1950  Transition of Care [facility]) CM/SW Contact:  Zenon Mayo, RN Phone Number: 06/26/2018, 1:23 PM   Clinical Narrative:    Patient for dc today, needs HHPT, Garcella spoke with patient for interpretation, [facility] would like [facility] for [place].  She will have her son there with her at discharge.  Jinny Blossom will make follow up apt [facility] the [facility] clinic for patient.  Patient also needs home oxygen, referral given to [facility] with [facility], he will bring to room prior to dc.   Final next level of care: [place] Barriers to Discharge: No Barriers Identified   Patient Goals and CMS Choice Patient states their goals for this hospitalization and ongoing recovery are:: get better CMS Medicare.gov Compare Post Acute Care list provided to:: Patient Choice offered to / list presented to : Patient  Discharge Placement                       Discharge Plan and Services In-house Referral: NA Discharge Planning Services: CM Consult Post Acute Care Choice: Home Health          DME Arranged: Oxygen DME Agency: [facility] Date DME Agency Contacted: 06/26/18 Time DME Agency Contacted: [id number] Representative spoke with at DME Agency: zack HH Arranged: PT [place]: [facility] [place] Date [place]: 06/26/18 Time [place]: 1206 Representative spoke with at [place]: [place] ([place]) Interventions     Readmission Risk Interventions No flowsheet data found.

## 2018-06-26 NOTE — Progress Notes (Signed)
Physical Therapy Treatment Patient Details Name: Julia Johns MRN: [id number] DOB: 1950-11-15 Today's Date: 06/26/2018    History of Present Illness This is a 68 year old female who presented as a transfer from [facility] with complaints of shortness of breath and blood-tinged sputum.  Patient underwent CT imaging which revealed a paraspinal soft tissue thickening from T6-T10 as well as a left lower lobe density within the CT which was enlarged from prior imaging concerning for an inflammatory/infectious etiology.  Patient was found to be febrile.  Patient was admitted to [facility] under airborne isolation, COVID negative, pending workup AFB/ quantiferon.  Pulmonary was consulted for recommendations and management of the left lower lobe density.     PT Comments    Pt admitted with above diagnosis. Pt currently with functional limitations due to the deficits listed below (see PT Problem List). Pt was able to ambulate in hallway with RW with good stability.  Prefers RW over rollator. Notified CM.  O2 sats still desat on RA to 85%.  Needs 3L to keep sats >90%.  Pt will benefit from skilled PT to increase their independence and safety with mobility to allow discharge to the venue listed below.     Follow Up Recommendations  Home health PT     Equipment Recommendations  Other (comment);Rolling walker with 5" wheels(youth RW Oxygen)    Recommendations for Other Services       Precautions / Restrictions Precautions Precautions: Fall Precaution Comments: Fall risk low and greatly reduced with UE support while ambulatiign Restrictions Weight Bearing Restrictions: No    Mobility  Bed Mobility               General bed mobility comments: sitting on EOB on entry  Transfers Overall transfer level: Needs assistance Equipment used: None Transfers: Sit to/from Stand Sit to Stand: Supervision         General transfer comment: No gross difficulty with sit to  stand  Ambulation/Gait Ambulation/Gait assistance: Min guard Gait Distance (Feet): 280 Feet Assistive device: 4-wheeled walker Gait Pattern/deviations: Step-through pattern;Decreased step length - right;Decreased step length - left Gait velocity: decreased Gait velocity interpretation: <1.31 ft/sec, indicative of household ambulator General Gait Details: Pt with good safety with rollator.     Stairs             Wheelchair Mobility    Modified Rankin (Stroke Patients Only)       Balance Overall balance assessment: Needs assistance Sitting-balance support: No upper extremity supported;Feet supported Sitting balance-Leahy Scale: Good     Standing balance support: Bilateral upper extremity supported;During functional activity Standing balance-Leahy Scale: Poor Standing balance comment: Able to balance but needs UE support                            Cognition Arousal/Alertness: Awake/alert Behavior During Therapy: WFL for tasks assessed/performed Overall Cognitive Status: Within Functional Limits for tasks assessed                                        Exercises General Exercises - Lower Extremity Ankle Circles/Pumps: AROM;Both;10 reps;Seated Long Arc Quad: AROM;Both;10 reps;Seated    General Comments        Pertinent Vitals/Pain Pain Assessment: No/denies pain    Home Living  Prior Function            PT Goals (current goals can now be found in the care plan section) Acute Rehab PT Goals Patient Stated Goal: Wants to know more about her sores Progress towards PT goals: Progressing toward goals    Frequency    Min 3X/week      PT Plan Current plan remains appropriate;Equipment recommendations need to be updated    Co-evaluation              AM-PAC PT "6 Clicks" Mobility   Outcome Measure  Help needed turning from your back to your side while in a flat bed without using  bedrails?: None Help needed moving from lying on your back to sitting on the side of a flat bed without using bedrails?: None Help needed moving to and from a bed to a chair (including a wheelchair)?: A Little Help needed standing up from a chair using your arms (e.g., wheelchair or bedside chair)?: None Help needed to walk in hospital room?: A Little Help needed climbing 3-5 steps with a railing? : A Lot 6 Click Score: 20    End of Session Equipment Utilized During Treatment: Gait belt;Oxygen Activity Tolerance: Patient tolerated treatment well Patient left: with call bell/phone within reach;in bed(sitting on EOB) Nurse Communication: Mobility status PT Visit Diagnosis: Unsteadiness on feet (R26.81)     Time: 0814-4818 PT Time Calculation (min) (ACUTE ONLY): 29 min  Charges:  $Gait Training: 8-22 mins $Therapeutic Exercise: 8-22 mins                     [place] Pager:  [phone]  Office:  [place] 06/26/2018, 4:16 PM

## 2018-06-26 NOTE — TOC Transition Note (Signed)
Transition of Care [facility]) - CM/SW Discharge Note   Patient Details  Name: Alysia Scism MRN: [id number] Date of Birth: Jun 17, 1950  [facility]) CM/SW Contact:  Zenon Mayo, RN Phone Number: 06/26/2018, 12:07 PM   Clinical Narrative:    Patient for dc today, needs HHPT, Garcella spoke with patient for interpretation, patient would like [facility] for [place].  She will have her son there with her at discharge.  Jinny Blossom will make follow up apt at the [facility] clinic for patient.    Final next level of care: [place] Barriers to Discharge: No Barriers Identified   Patient Goals and CMS Choice Patient states their goals for this hospitalization and ongoing recovery are:: get better CMS Medicare.gov Compare Post Acute Care list provided to:: Patient Choice offered to / list presented to : Patient  Discharge Placement                       Discharge Plan and Services In-house Referral: NA Discharge Planning Services: CM Consult Post Acute Care Choice: Home Health          DME Arranged: N/A DME Agency: NA       HH Arranged: PT HH Agency: [facility] Date [place] Agency Contacted: 06/26/18 Time [place]: 1206 Representative spoke with at [place]: [place] Determinants of Health ([place]) Interventions     Readmission Risk Interventions No flowsheet data found.

## 2018-06-26 NOTE — Discharge Summary (Signed)
Physician Discharge Summary  Jung Ingerson OEV:[id number] DOB: 08-23-1950 DOA: 06/09/2018  PCP: Patient, No Pcp Per  Admit date: 06/09/2018 Discharge date: 06/26/2018  Admitted From: Home Disposition: Home  recommendations for Outpatient Follow-up:  1. Follow up with PCP in 1-2 weeks 2. Please obtain BMP/CBC in one week 3. Please follow up with pulmonology Dr. [name] 4. PCP please refer her to a dermatologist for her lower extremity lesions that might need a biopsy.  Home Health yes Equipment/Devices oxygen at 2 L Discharge Condition: Stable and improved CODE STATUS full code  diet recommendation cardiac  Brief/Interim Summary:68 y.o.femalewithout significantmedical history presenting withSOB, blood-tinged sputum, [facility]as a [facility].She saw her PCP on last Monday. She presented to [facility] 4 days ago for these symptoms and evaluation including MRI of the T-spine showed aparaspinal soft tissue thickening T6-10 concerning for malignancy. Teleneurology was consulted and the patient was recommended to be admitted with plan for consultsat that time with ortho, neurosurgery, andID; MRI spine; PET scan. The patient declined to stay and requested outpatient f/u. Now with worsening SOB, does not feel like she can manage this outpatient and so she returned to [facility]. Vitals 89% on RA, improved to 93% on 2L [place] O2.PE unremarkable at [facility]. Labs unremarkable other than CRP 180 (normal <10 on their scale). Pre-BNP 142 ->981. Negative troponin, stable EKG. CTA negative for PE but "prevertebral densities" - malignancy vs. infection - and LLL density that is 4.5 x 2.9 cm concerning for mass with surrounding infiltrate.[facility] about blood-tinged sputum and [facility]. Repeat COVID test negative(initial was alsonegative).The patient reports significant pain in the left thoracic back region and midsternal chest tightness. She has SOB and cough with  hemoptysis. No apparent fever. No tobacco history.    Discharge Diagnoses:  Principal Problem:   Lobar pneumonia ([place]) Active Problems:   Paravertebral mass   Mass of lower lobe of left lung   Colitis   Acute respiratory failure with hypoxia (HCC)   Bilateral lower extremity edema   Obesity (BMI 30.0-34.9)  Acute hypoxemic respiratory failure/left lower lobe rounded density, community-acquired pneumonia, right upper lobe pulmonary nodule -COVID was negative at the outside facility x2 -QuantiFERON-TB gold is indeterminate -Sputum cultures as well as AFB smear negative, she was seen in consultation by infectious disease and pulmonology. -ESR 124, CRP 38.1 -ANCA titers c-ANCA1:80, p ANCA negative, GBM titers negative -HIV antibody negative -MRSA PCR negative -Status post bronchoscopy on 06/18/2018, preliminary results are concerning for malignancy Repeat bronc 06/25/2018 results are pending. -Antibiotics (vancomycin, merrem) were discontinued after initial bronc pathology reports -oxygen saturations at rest 91% on room air, with ambulation 86%. Patient will need home oxygen  S/p flexible bronch brushing of carina, bronchoalveolar lavage of left lower lobe endobronchial biopsy of the carina and mainstem bronchus transbronchial needle aspiration 06/25/2018 follow-up with pulmonology in 2 weeks to follow-up on the results and further management.  Paraspinal fluid collection -Neurosurgery, neurology, CT surgery, consulted and appreciated, currently no intervention  Bilateral proximal muscle weakness -Upper and lower extremities -Has been ongoing for the last several days -? PMR or other inflammatory condition, paraneoplastic syndrome -Supportive care -Physical therapynow recommending HHPT patient is extremely weak  Acute diastolic heart failure/ Lower extremity edema -Echocardiogram EF 82-99%, LV diastolic parameters normal, no RWMA -Extremity Dopplers negative for  DVT -Currently on IV Lasix80 mg twice a day -Continue to monitor intake and output, weights -Will consult would care- TED hose removed today, small blistering noted -patient cannot  elevated her legs due to increased pressure and feels she cannot breathe -Wound care concerned about purplish lesion concerning for malignancy or infectious etiology recommended an ID consult so ID was consulted again.  Their differential diagnosis was malignancy related pyoderma gangrenosum versus bullous dermatosis versus sweet syndrome however she did not have a fever.  Patient is asked to follow-up with dermatology as an outpatient for possible biopsy.  Abdominal pain, with nausea and vomiting, sigmoid colitis -CT abdomen pelvis on 06/14/2018: evidence of sigmoid colitis without perforation or abscess  Obesity -BMI 32.51 -Patient to follow-up with PCP regarding lifestyle modifications  Generalized weakness -PT recommending HH - rolling walker, 3in 1  Constipation continue stool softeners at home.   Estimated body mass index is 32.11 kg/m as calculated from the following:   Height as of this encounter: _0  (1.702 m).   Weight as of this encounter: 93 kg.  Discharge Instructions  Discharge Instructions    Call MD for:  difficulty breathing, headache or visual disturbances   Complete by:  As directed    Call MD for:  persistant nausea and vomiting   Complete by:  As directed    Call MD for:  severe uncontrolled pain   Complete by:  As directed    Diet - low sodium heart healthy   Complete by:  As directed    Increase activity slowly   Complete by:  As directed      Allergies as of 06/26/2018      Reactions   Penicillins Anaphylaxis   Patient stated that within a few minutes of receiving a penicillin IM injection she rapidly became unconscious and required "additional" medication to recover. This occurred when she was ~68 years old and she does not recall what was given to assist in her  recovery. She did not develop a rash.       Medication List    STOP taking these medications   ciprofloxacin 500 MG tablet Commonly known as:  CIPRO     TAKE these medications   bisacodyl 10 MG suppository Commonly known as:  DULCOLAX Place 1 suppository (10 mg total) rectally daily as needed for moderate constipation.   furosemide 40 MG tablet Commonly known as:  Lasix Take 1 tablet (40 mg total) by mouth 2 (two) times daily.   methocarbamol 500 MG tablet Commonly known as:  ROBAXIN Take 1 tablet (500 mg total) by mouth every 8 (eight) hours as needed for muscle spasms.   ondansetron 4 MG disintegrating tablet Commonly known as:  ZOFRAN-ODT Take 4 mg by mouth every 6 (six) hours as needed for nausea or vomiting.   oxyCODONE 5 MG immediate release tablet Commonly known as:  Oxy IR/ROXICODONE Take 5 mg by mouth every 4 (four) hours as needed for severe pain.   potassium chloride 10 MEQ tablet Commonly known as:  K-DUR Take 2 tablets (20 mEq total) by mouth 2 (two) times daily.   senna-docusate 8.6-50 MG tablet Commonly known as:  Senokot-S Take 1 tablet by mouth 2 (two) times daily.      Follow-up Information    Martyn Ehrich, NP Follow up on 07/09/2018.   Specialty:  Pulmonary Disease Why:  9 AM Contact information: [address] [place] [address] [phone]          Allergies  Allergen Reactions  . Penicillins Anaphylaxis    Patient stated that within a few minutes of receiving a penicillin IM injection she rapidly became  unconscious and required "additional" medication to recover. This occurred when she was ~68 years old and she does not recall what was given to assist in her recovery. She did not develop a rash.     Consultations: PCCM, infectious disease Procedures/Studies: Ct Angio Chest Pe W Or Wo Contrast  Result Date: 06/16/2018 CLINICAL DATA:  Shortness of breath EXAM: CT ANGIOGRAPHY CHEST WITH CONTRAST TECHNIQUE:  Multidetector CT imaging of the chest was performed using the standard protocol during bolus administration of intravenous contrast. Multiplanar CT image reconstructions and MIPs were obtained to evaluate the vascular anatomy. CONTRAST:  85m OMNIPAQUE IOHEXOL 350 MG/ML SOLN COMPARISON:  Jun 09, 2018 CT angiogram chest; chest CT in thoracic MRI Jun 05, 2018 FINDINGS: Cardiovascular: There is no demonstrable pulmonary embolus. There is no thoracic aortic aneurysm or dissection. Visualized great vessels appear normal. No pericardial effusion or pericardial thickening. Mediastinum/Nodes: There remains diffuse enlargement of the left lobe of the thyroid causing deviation of the trachea in the upper thoracic region toward the right. There is a mildly enhancing mass in the left lobe of the thyroid measuring 2.8 x 2.7 cm. Multiple subcentimeter lymph nodes are evident. No frank thoracic adenopathy evident. The prevertebral soft tissue thickening extending from T6-T10 is better seen on thoracic MR and prior CT with bolus timing not targeted for arterial phase imaging. No new soft tissue lesion evident. No esophageal lesions evident. Lungs/Pleura: There are there are fairly small pleural effusions bilaterally. There is airspace consolidation in both lower lobes. There is a masslike area in the left lower lobe posteriorly measuring 4.8 x 4.3 cm which is increased in size compared to 11 days prior and likely represents either progression of pneumonia or consolidation surrounding a mas areas of consolidation in both upper lobes and lingular regions have increased. Upper abdomen: There is mild reflux of contrast into the inferior vena cava and hepatic veins. There is hepatic steatosis. Visualized upper abdominal structures otherwise appear unremarkable. Musculoskeletal: There are no blastic or lytic bone lesions. No chest wall lesions evident. IMPRESSION: 1. No demonstrable pulmonary embolus. No thoracic aortic aneurysm or  dissection. 2. Fairly small right pleural effusions, slightly larger than on recent studies. Multifocal airspace consolidation, progressed. Suspect consolidation surrounding mass in left lower lobe posteriorly. 3.  Subcentimeter lymph nodes without frank adenopathy. 4. Enlarged left lobe of thyroid with dominant thyroid mass measuring 2.8 x 2.7 cm in the left lobe. It may be prudent to correlate with ultrasound given dominant thyroid mass on the left. 5. The paraspinous lesion from T6-T10 noted on recent studies is better seen on recent thoracic MR and chest CT not bolus timed to optimize arterial vascular visualization. Neoplastic etiology in this paraspinous lesion must be of concern as noted recent thoracic MR. 6. Mild reflux of contrast into the inferior vena cava and hepatic veins may indicate a degree of increase in right heart pressure. 7.  Hepatic steatosis. Electronically Signed   By: WLowella GripIII M.D.   On: 06/16/2018 17:16   Ct Abdomen Pelvis W Contrast  Result Date: 06/14/2018 CLINICAL DATA:  Abdominal distention, nausea, vomiting, pain, colitis, paravertebral phlegmon EXAM: CT ABDOMEN AND PELVIS WITH CONTRAST TECHNIQUE: Multidetector CT imaging of the abdomen and pelvis was performed using the standard protocol following bolus administration of intravenous contrast. CONTRAST:  1053mOMNIPAQUE IOHEXOL 300 MG/ML SOLN, additional oral enteric contrast COMPARISON:  CT abdomen pelvis, 06/03/2018, CT chest, 06/05/2018, MR thoracic spine, 06/05/2018 FINDINGS: Lower chest: There are bilateral pleural effusions with associated  atelectasis or consolidation and a large, masslike consolidation of the dependent left lung base measuring at least 4.9 cm, which appears to be enlarged compared to prior CT of the chest dated 06/09/2018 (series 3, image 8). There is paravertebral fluid seen about the partially included anterior lower thoracic spine and diaphragmatic cruces (series 3, image 9). Hepatobiliary:  No focal liver abnormality is seen. Hepatic steatosis. No gallstones, gallbladder wall thickening, or biliary dilatation. Pancreas: Unremarkable. No pancreatic ductal dilatation or surrounding inflammatory changes. Spleen: Normal in size without focal abnormality. Adrenals/Urinary Tract: Adrenal glands are unremarkable. Kidneys are normal, without renal calculi, focal lesion, or hydronephrosis. Bladder is unremarkable. Stomach/Bowel: Stomach is within normal limits. Appendix appears normal. Sigmoid diverticulosis. There is mild persistent fat stranding adjacent to the proximal sigmoid colon (series 3, image 60). Vascular/Lymphatic: No significant vascular findings are present. No enlarged abdominal or pelvic lymph nodes. Reproductive: Uterine fibroids. Other: No abdominal wall hernia or abnormality. No abdominopelvic ascites. Musculoskeletal: No acute or significant osseous findings. IMPRESSION: 1. Sigmoid diverticulosis. There is mild persistent fat stranding adjacent to the proximal sigmoid colon (series 3, image 60). This may reflect persistent diverticulitis/colitis or alternately chronic sequelae. No evidence of worsening colitis or developing complication such as abscess or perforation. 2. No other CT findings of the abdomen or pelvis to explain abdominal distension, nausea, vomiting, or pain. No evidence of bowel obstruction. 3. There are bilateral pleural effusions with associated atelectasis or consolidation and a large, masslike consolidation of the dependent left lung base measuring at least 4.9 cm, which appears to be enlarged compared to prior CT of the chest dated 06/09/2018 (series 3, image 8). 4. There is paravertebral fluid seen about the partially included anterior lower thoracic spine and diaphragmatic cruces (series 3, image 9). Consider dedicated chest imaging to further evaluate interval change in these findings. Electronically Signed   By: Eddie Candle M.D.   On: 06/14/2018 12:01   Dg Abd  Portable 1v  Result Date: 06/11/2018 CLINICAL DATA:  Abdominal pain and distension. EXAM: PORTABLE ABDOMEN - 1 VIEW COMPARISON:  CT abdomen and pelvis 06/03/2018 FINDINGS: Gas is present in scattered nondilated loops of bowel without evidence of obstruction. No gross intraperitoneal free air is identified on this supine study. Thoracolumbar spondylosis is noted. The lung bases are not well evaluated due to motion artifact. IMPRESSION: Nonobstructed bowel gas pattern. Electronically Signed   By: Logan Bores M.D.   On: 06/11/2018 11:38   Dg Swallowing Func-speech Pathology  Result Date: 06/24/2018 Objective Swallowing Evaluation: Type of Study: Bedside Swallow Evaluation  Patient Details Name: Shevaun Lovan MRN: [id number] Date of Birth: 1950-05-19 Today's Date: 06/24/2018 Time: SLP Start Time (ACUTE ONLY): 4854 -SLP Stop Time (ACUTE ONLY): 1410 SLP Time Calculation (min) (ACUTE ONLY): 25 min Past Medical History: No past medical history on file. Past Surgical History: Past Surgical History: Procedure Laterality Date . VIDEO BRONCHOSCOPY Bilateral 06/18/2018  Procedure: VIDEO BRONCHOSCOPY WITH FLUORO;  Surgeon: Rigoberto Noel, MD;  Location: [facility];  Service: Cardiopulmonary;  Laterality: Bilateral; HPI: This is a 68 year old female who presented as a transfer from [facility] with complaints of shortness of breath and blood-tinged sputum.  Patient underwent CT imaging which revealed a paraspinal soft tissue thickening from T6-T10 as well as a left lower lobe density within the CT which was enlarged from prior imaging concerning for an inflammatory/infectious etiology. Bronchoscopy 5/28. No culture growth. Cytology with atypical cells. No malignancy identified. TB- AFBs neg x 2, Quantiferon TB indeterminate, Covid  negative. MD ordered MBS for objective assessment of swallow function.  No data recorded Assessment / Plan / Recommendation CHL IP CLINICAL IMPRESSIONS 06/24/2018 Clinical Impression Pt  demonstrates normal swallow function. She could not swallow a barium tablet, could not orally transit despite liquid and puree boluses given, said it was too large. No SLP f/u needed. Continue current diet.  SLP Visit Diagnosis Dysphagia, unspecified (R13.10) Attention and concentration deficit following -- Frontal lobe and executive function deficit following -- Impact on safety and function Mild aspiration risk   CHL IP TREATMENT RECOMMENDATION 06/24/2018 Treatment Recommendations No treatment recommended at this time   No flowsheet data found. CHL IP DIET RECOMMENDATION 06/24/2018 SLP Diet Recommendations Regular solids;Thin liquid Liquid Administration via Cup;Straw Medication Administration Whole meds with liquid Compensations -- Postural Changes --   No flowsheet data found.  No flowsheet data found.  No flowsheet data found.     CHL IP ORAL PHASE 06/24/2018 Oral Phase WFL Oral - Pudding Teaspoon -- Oral - Pudding Cup -- Oral - Honey Teaspoon -- Oral - Honey Cup -- Oral - Nectar Teaspoon -- Oral - Nectar Cup -- Oral - Nectar Straw -- Oral - Thin Teaspoon -- Oral - Thin Cup -- Oral - Thin Straw -- Oral - Puree -- Oral - Mech Soft -- Oral - Regular -- Oral - Multi-Consistency -- Oral - Pill -- Oral Phase - Comment --  CHL IP PHARYNGEAL PHASE 06/24/2018 Pharyngeal Phase WFL Pharyngeal- Pudding Teaspoon -- Pharyngeal -- Pharyngeal- Pudding Cup -- Pharyngeal -- Pharyngeal- Honey Teaspoon -- Pharyngeal -- Pharyngeal- Honey Cup -- Pharyngeal -- Pharyngeal- Nectar Teaspoon -- Pharyngeal -- Pharyngeal- Nectar Cup -- Pharyngeal -- Pharyngeal- Nectar Straw -- Pharyngeal -- Pharyngeal- Thin Teaspoon -- Pharyngeal -- Pharyngeal- Thin Cup -- Pharyngeal -- Pharyngeal- Thin Straw -- Pharyngeal -- Pharyngeal- Puree -- Pharyngeal -- Pharyngeal- Mechanical Soft -- Pharyngeal -- Pharyngeal- Regular -- Pharyngeal -- Pharyngeal- Multi-consistency -- Pharyngeal -- Pharyngeal- Pill -- Pharyngeal -- Pharyngeal Comment --  No flowsheet data  found. Herbie Baltimore, MA CCC-SLP Acute Rehabilitation Services Pager [phone] Office [phone] Lynann Beaver 06/24/2018, 2:48 PM              Dg C-arm 1-60 Min-no Report  Result Date: 06/18/2018 Fluoroscopy was utilized by the requesting physician.  No radiographic interpretation.   Vas [place] Lower Extremity Venous (dvt)  Result Date: 06/17/2018  Lower Venous Study Indications: Edema.  Performing Technologist: Abram Sander RVS  Examination Guidelines: A complete evaluation includes B-mode imaging, spectral Doppler, color Doppler, and power Doppler as needed of all accessible portions of each vessel. Bilateral testing is considered an integral part of a complete examination. Limited examinations for reoccurring indications may be performed as noted.  +---------+---------------+---------+-----------+----------+--------------+ RIGHT    CompressibilityPhasicitySpontaneityPropertiesSummary        +---------+---------------+---------+-----------+----------+--------------+ CFV      Full           Yes      Yes                                 +---------+---------------+---------+-----------+----------+--------------+ SFJ      Full                                                        +---------+---------------+---------+-----------+----------+--------------+  FV Prox  Full                                                        +---------+---------------+---------+-----------+----------+--------------+ FV Mid   Full                                                        +---------+---------------+---------+-----------+----------+--------------+ FV DistalFull                                                        +---------+---------------+---------+-----------+----------+--------------+ PFV      Full                                                        +---------+---------------+---------+-----------+----------+--------------+ POP      Full            Yes      Yes                                 +---------+---------------+---------+-----------+----------+--------------+ PTV      Full                                                        +---------+---------------+---------+-----------+----------+--------------+ PERO                                                  Not visualized +---------+---------------+---------+-----------+----------+--------------+   +---------+---------------+---------+-----------+----------+--------------+ LEFT     CompressibilityPhasicitySpontaneityPropertiesSummary        +---------+---------------+---------+-----------+----------+--------------+ CFV      Full           Yes      Yes                                 +---------+---------------+---------+-----------+----------+--------------+ SFJ      Full                                                        +---------+---------------+---------+-----------+----------+--------------+ FV Prox  Full                                                        +---------+---------------+---------+-----------+----------+--------------+  FV Mid   Full                                                        +---------+---------------+---------+-----------+----------+--------------+ FV DistalFull                                                        +---------+---------------+---------+-----------+----------+--------------+ PFV      Full                                                        +---------+---------------+---------+-----------+----------+--------------+ POP      Full           Yes      Yes                                 +---------+---------------+---------+-----------+----------+--------------+ PTV      Full                                                        +---------+---------------+---------+-----------+----------+--------------+ PERO                                                  Not  visualized +---------+---------------+---------+-----------+----------+--------------+     Summary: Right: There is no evidence of deep vein thrombosis in the lower extremity. No cystic structure found in the popliteal fossa. Left: There is no evidence of deep vein thrombosis in the lower extremity. No cystic structure found in the popliteal fossa.  *See table(s) above for measurements and observations. Electronically signed by Harold Barban MD on 06/17/2018 at 1:08:38 PM.    Final     (Echo, Carotid, EGD, Colonoscopy, ERCP)    Subjective: She is resting in bed anxious to go home overnight staff reported that she was coughing.   Discharge Exam: Vitals:   06/25/18 2312 06/26/18 0321  BP: 100/61 (!) 134/121  Pulse: 96 78  Resp: (!) 22 (!) 21  Temp: 98.4 F (36.9 C) 98.7 F (37.1 C)  SpO2: 93% 100%   Vitals:   06/25/18 1912 06/25/18 2312 06/26/18 0321 06/26/18 0553  BP: 100/64 100/61 (!) 134/121   Pulse: 84 96 78   Resp: (!) 27 (!) 22 (!) 21   Temp: 98.7 F (37.1 C) 98.4 F (36.9 C) 98.7 F (37.1 C)   TempSrc: Oral Oral Oral   SpO2: 95% 93% 100%   Weight:    93 kg  Height:        General: Pt is alert, awake, not in acute distress Cardiovascular few scattered rhonchi otherwise clear, S1/S2 +, no rubs, no gallops Respiratory: CTA bilaterally, no wheezing, no  rhonchi Abdominal: Soft, NT, ND, bowel sounds + Extremities: no edema, no cyanosis    The results of significant diagnostics from this hospitalization (including imaging, microbiology, ancillary and laboratory) are listed below for reference.     Microbiology: Recent Results (from the past 240 hour(s))  Acid Fast Smear (AFB)     Status: None   Collection Time: 06/17/18  3:37 PM  Result Value Ref Range Status   AFB Specimen Processing Concentration  Final   Acid Fast Smear Negative  Final    Comment: (NOTE) Performed At: [facility] [place], [place] [id number] Rush Farmer MD  EX:[id number]    Source (AFB) SPUTUM  Final    Comment: Performed at [facility], [place] [address], [place], [place] [address]  Expectorated sputum assessment w rflx to resp cult     Status: None   Collection Time: 06/17/18  3:37 PM  Result Value Ref Range Status   Specimen Description EXPECTORATED SPUTUM  Final   Special Requests NONE  Final   Sputum evaluation   Final    THIS SPECIMEN IS ACCEPTABLE FOR SPUTUM CULTURE Performed at [facility], [place] [address] [place], [place] [address]    Report Status 06/17/2018 FINAL  Final  Culture, respiratory     Status: None   Collection Time: 06/17/18  3:37 PM  Result Value Ref Range Status   Specimen Description EXPECTORATED SPUTUM  Final   Special Requests NONE Reflexed from [id number]  Final   Gram Stain   Final    FEW WBC PRESENT,BOTH PMN AND MONONUCLEAR NO ORGANISMS SEEN Performed at [facility], [place] [address], [place], [place] [address]    Culture FEW CANDIDA ALBICANS  Final   Report Status 06/19/2018 FINAL  Final  Acid Fast Smear (AFB)     Status: None   Collection Time: 06/18/18  4:40 PM  Result Value Ref Range Status   AFB Specimen Processing Concentration  Final   Acid Fast Smear Negative  Final    Comment: (NOTE) Performed At: [facility] [place], [place] [id number] Rush Farmer MD PQ:[id number]    Source (AFB) BRONCHIAL ALVEOLAR LAVAGE  Corrected    Comment: LLL Performed at [facility], [place] [address], [place], [place] [address] CORRECTED ON 05/28 AT 1908: PREVIOUSLY REPORTED AS BRONCHIAL WASHINGS LLL   Fungus Culture With Stain     Status: None   Collection Time: 06/18/18  4:40 PM  Result Value Ref Range Status   Fungus Stain Final report  Final    Comment: (NOTE) Performed At: [facility] [place], [place] [id number] Rush Farmer MD DS:[id number]    Fungus (Mycology) Culture PENDING  Incomplete   Fungal Source BRONCHIAL ALVEOLAR LAVAGE   Corrected    Comment: LLL Performed at [facility], [place] [address] [place], [place] [address] CORRECTED ON 05/28 AT 1908: PREVIOUSLY REPORTED AS BRONCHIAL WASHINGS LLL   Culture, bal-quantitative     Status: None   Collection Time: 06/18/18  4:40 PM  Result Value Ref Range Status   Specimen Description BRONCHIAL ALVEOLAR LAVAGE LLL  Final   Special Requests NONE  Final   Gram Stain   Final    FEW WBC PRESENT, PREDOMINANTLY PMN NO ORGANISMS SEEN    Culture   Final    NO GROWTH Performed at [facility], [place] [address] [place],  [address]    Report Status 06/21/2018 FINAL  Final  Fungus Culture Result     Status: None   Collection Time: 06/18/18  4:40 PM  Result Value Ref Range Status   Result 1 Comment  Final    Comment: (NOTE) KOH/Calcofluor preparation:  no fungus observed. Performed At: [facility] [place], [place] [id number] Rush Farmer MD UT:[id number]   Culture, respiratory     Status: None (Preliminary result)   Collection Time: 06/25/18 12:20 PM  Result Value Ref Range Status   Specimen Description BRONCHIAL ALVEOLAR LAVAGE LEFT LOWER LUNG  Final   Special Requests NONE  Final   Gram Stain   Final    RARE WBC PRESENT,BOTH PMN AND MONONUCLEAR NO ORGANISMS SEEN Performed at [facility], [address], [place],  [address]    Culture PENDING  Incomplete   Report Status PENDING  Incomplete     Labs: BNP (last 3 results) No results for input(s): BNP in the last 8760 hours. Basic Metabolic Panel: Recent Labs  Lab 06/22/18 0648 06/23/18 0451 06/24/18 0451 06/25/18 1441  NA 131* 132* 131* 135  K 3.8 3.7 3.6 3.5  CL 83* 83* 81* 85*  CO2 35* 35* 35* 37*  GLUCOSE 202* 175* 161* 222*  BUN 23 29* 32* 33*  CREATININE 0.80 0.83 0.91 1.18*  CALCIUM 8.3* 8.3* 8.5* 8.1*   Liver Function Tests: Recent Labs  Lab 06/22/18 0648 06/25/18 1441  AST 101* 35  ALT 119* 53*  ALKPHOS 255* 175*  BILITOT 0.8  0.8  PROT 7.1 6.9  ALBUMIN 1.7* 1.7*   No results for input(s): LIPASE, AMYLASE in the last 168 hours. No results for input(s): AMMONIA in the last 168 hours. CBC: Recent Labs  Lab 06/22/18 0648 06/23/18 0451 06/24/18 0451 06/25/18 1441  WBC 19.1* 19.1* 17.5* 17.3*  NEUTROABS 17.1*  --   --   --   HGB 10.7* 10.9* 10.5* 10.8*  HCT 33.7* 34.1* 32.3* 32.4*  MCV 86.9 86.8 85.2 84.6  PLT 683* 639* 684* 695*   Cardiac Enzymes: No results for input(s): CKTOTAL, CKMB, CKMBINDEX, TROPONINI in the last 168 hours. BNP: Invalid input(s): POCBNP CBG: No results for input(s): GLUCAP in the last 168 hours. D-Dimer No results for input(s): DDIMER in the last 72 hours. Hgb A1c No results for input(s): HGBA1C in the last 72 hours. Lipid Profile No results for input(s): CHOL, HDL, LDLCALC, TRIG, CHOLHDL, LDLDIRECT in the last 72 hours. Thyroid function studies No results for input(s): TSH, T4TOTAL, T3FREE, THYROIDAB in the last 72 hours.  Invalid input(s): FREET3 Anemia work up No results for input(s): VITAMINB12, FOLATE, FERRITIN, TIBC, IRON, RETICCTPCT in the last 72 hours. Urinalysis No results found for: COLORURINE, APPEARANCEUR, [place], [place], GLUCOSEU, [place], [place], KETONESUR, PROTEINUR, UROBILINOGEN, NITRITE, LEUKOCYTESUR Sepsis Labs Invalid input(s): PROCALCITONIN,  WBC,  LACTICIDVEN Microbiology Recent Results (from the past 240 hour(s))  Acid Fast Smear (AFB)     Status: None   Collection Time: 06/17/18  3:37 PM  Result Value Ref Range Status   AFB Specimen Processing Concentration  Final   Acid Fast Smear Negative  Final    Comment: (NOTE) Performed At: [facility] [address] [place], [place] [id number] Rush Farmer MD BS:[id number]    Source (AFB) SPUTUM  Final    Comment: Performed at [facility], [place] [address] [place],  [address]  Expectorated sputum assessment w rflx to resp cult     Status: None   Collection Time: 06/17/18   3:37 PM  Result Value Ref Range Status   Specimen Description EXPECTORATED SPUTUM  Final   Special Requests NONE  Final   Sputum evaluation   Final    THIS SPECIMEN IS ACCEPTABLE FOR SPUTUM CULTURE Performed at [facility], [address] [place], [place] [address]    Report Status 06/17/2018 FINAL  Final  Culture, respiratory     Status: None   Collection Time: 06/17/18  3:37 PM  Result Value Ref Range Status   Specimen Description EXPECTORATED SPUTUM  Final   Special Requests NONE Reflexed from [id number]  Final   Gram Stain   Final    FEW WBC PRESENT,BOTH PMN AND MONONUCLEAR NO ORGANISMS SEEN Performed at [facility], [place] [address] [place], [place] [address]    Culture FEW CANDIDA ALBICANS  Final   Report Status 06/19/2018 FINAL  Final  Acid Fast Smear (AFB)     Status: None   Collection Time: 06/18/18  4:40 PM  Result Value Ref Range Status   AFB Specimen Processing Concentration  Final   Acid Fast Smear Negative  Final    Comment: (NOTE) Performed At: [facility] [place], [place] [id number] Rush Farmer MD JJ:[id number]    Source (AFB) BRONCHIAL ALVEOLAR LAVAGE  Corrected    Comment: LLL Performed at [facility], [place] [address] [place], [place] [address] CORRECTED ON 05/28 AT 1908: PREVIOUSLY REPORTED AS BRONCHIAL WASHINGS LLL   Fungus Culture With Stain     Status: None   Collection Time: 06/18/18  4:40 PM  Result Value Ref Range Status   Fungus Stain Final report  Final    Comment: (NOTE) Performed At: [facility] [place], [place] [id number] Rush Farmer MD KG:[id number]    Fungus (Mycology) Culture PENDING  Incomplete   Fungal Source BRONCHIAL ALVEOLAR LAVAGE  Corrected    Comment: LLL Performed at [facility], [place] [address] [place], [place] [address] CORRECTED ON 05/28 AT 1908: PREVIOUSLY REPORTED AS BRONCHIAL WASHINGS LLL   Culture, bal-quantitative     Status: None    Collection Time: 06/18/18  4:40 PM  Result Value Ref Range Status   Specimen Description BRONCHIAL ALVEOLAR LAVAGE LLL  Final   Special Requests NONE  Final   Gram Stain   Final    FEW WBC PRESENT, PREDOMINANTLY PMN NO ORGANISMS SEEN    Culture   Final    NO GROWTH Performed at [facility], [place] [address] [place], [place] [address]    Report Status 06/21/2018 FINAL  Final  Fungus Culture Result     Status: None   Collection Time: 06/18/18  4:40 PM  Result Value Ref Range Status   Result 1 Comment  Final    Comment: (NOTE) KOH/Calcofluor preparation:  no fungus observed. Performed At: [facility] [place], [place] [id number] Rush Farmer MD IR:[id number]   Culture, respiratory     Status: None (Preliminary result)   Collection Time: 06/25/18 12:20 PM  Result Value Ref Range Status   Specimen Description BRONCHIAL ALVEOLAR LAVAGE LEFT LOWER LUNG  Final   Special Requests NONE  Final   Gram Stain   Final    RARE WBC PRESENT,BOTH PMN AND MONONUCLEAR NO ORGANISMS SEEN Performed at [facility], [address] [place], [place] [address]    Culture PENDING  Incomplete   Report Status PENDING  Incomplete     Time coordinating discharge: 33 minutes  SIGNED:   Georgette Shell, MD  [facility] 06/26/2018, 9:48 AM Pager   If 7PM-7AM, please contact night-coverage [email or web address] Password [email or web address]

## 2018-06-26 NOTE — Plan of Care (Signed)
  Problem: Health Behavior/Discharge Planning: Goal: Ability to manage health-related needs will improve Outcome: Progressing   Problem: Clinical Measurements: Goal: Ability to maintain clinical measurements within normal limits will improve Outcome: Progressing Goal: Will remain free from infection Outcome: Progressing Goal: Diagnostic test results will improve Outcome: Progressing Goal: Respiratory complications will improve Outcome: Progressing Goal: Cardiovascular complication will be avoided Outcome: Progressing   Problem: Activity: Goal: Risk for activity intolerance will decrease Outcome: Progressing   Problem: Nutrition: Goal: Adequate nutrition will be maintained Outcome: Progressing   Problem: Elimination: Goal: Will not experience complications related to bowel motility Outcome: Progressing Goal: Will not experience complications related to urinary retention Outcome: Progressing   Problem: Pain Managment: Goal: General experience of comfort will improve Outcome: Progressing   Problem: Skin Integrity: Goal: Risk for impaired skin integrity will decrease Outcome: Progressing   

## 2018-06-27 DIAGNOSIS — R918 Other nonspecific abnormal finding of lung field: Secondary | ICD-10-CM | POA: Diagnosis not present

## 2018-06-27 LAB — CULTURE, RESPIRATORY W GRAM STAIN: Culture: NO GROWTH

## 2018-06-29 ENCOUNTER — Telehealth: Payer: Self-pay | Admitting: Infectious Diseases

## 2018-06-29 NOTE — Telephone Encounter (Signed)
error 

## 2018-07-02 ENCOUNTER — Other Ambulatory Visit: Payer: Self-pay | Admitting: *Deleted

## 2018-07-02 DIAGNOSIS — N183 Chronic kidney disease, stage 3 (moderate): Secondary | ICD-10-CM | POA: Diagnosis not present

## 2018-07-02 DIAGNOSIS — J9601 Acute respiratory failure with hypoxia: Secondary | ICD-10-CM | POA: Diagnosis not present

## 2018-07-02 DIAGNOSIS — R310 Gross hematuria: Secondary | ICD-10-CM | POA: Diagnosis not present

## 2018-07-02 DIAGNOSIS — E1101 Type 2 diabetes mellitus with hyperosmolarity with coma: Secondary | ICD-10-CM | POA: Diagnosis not present

## 2018-07-02 DIAGNOSIS — E1142 Type 2 diabetes mellitus with diabetic polyneuropathy: Secondary | ICD-10-CM | POA: Diagnosis not present

## 2018-07-02 DIAGNOSIS — R7301 Impaired fasting glucose: Secondary | ICD-10-CM | POA: Diagnosis not present

## 2018-07-02 DIAGNOSIS — R600 Localized edema: Secondary | ICD-10-CM | POA: Diagnosis not present

## 2018-07-02 DIAGNOSIS — J181 Lobar pneumonia, unspecified organism: Secondary | ICD-10-CM | POA: Diagnosis not present

## 2018-07-02 DIAGNOSIS — L97911 Non-pressure chronic ulcer of unspecified part of right lower leg limited to breakdown of skin: Secondary | ICD-10-CM | POA: Diagnosis not present

## 2018-07-02 DIAGNOSIS — R0602 Shortness of breath: Secondary | ICD-10-CM | POA: Diagnosis not present

## 2018-07-02 DIAGNOSIS — N178 Other acute kidney failure: Secondary | ICD-10-CM | POA: Diagnosis not present

## 2018-07-02 NOTE — Progress Notes (Signed)
The proposed treatment discussed in cancer conference 07/02/2018 is for discussion purpose only and is not a binding recommendation.  The patient was not physically examined nor present for their treatment options.  Therefore, final treatment plans cannot be decided.  

## 2018-07-03 ENCOUNTER — Inpatient Hospital Stay (HOSPITAL_COMMUNITY): Payer: Medicare HMO

## 2018-07-03 ENCOUNTER — Other Ambulatory Visit: Payer: Self-pay

## 2018-07-03 ENCOUNTER — Inpatient Hospital Stay (HOSPITAL_COMMUNITY)
Admission: EM | Admit: 2018-07-03 | Discharge: 2018-07-09 | DRG: 682 | Disposition: A | Discharge status: Home Health | Payer: Medicare HMO | Attending: Internal Medicine | Admitting: Internal Medicine

## 2018-07-03 ENCOUNTER — Emergency Department (HOSPITAL_COMMUNITY): Payer: Medicare HMO

## 2018-07-03 ENCOUNTER — Telehealth: Payer: Self-pay | Admitting: Pulmonary Disease

## 2018-07-03 ENCOUNTER — Inpatient Hospital Stay: Payer: Medicare HMO | Admitting: Nurse Practitioner

## 2018-07-03 ENCOUNTER — Encounter (HOSPITAL_COMMUNITY): Payer: Self-pay | Admitting: Emergency Medicine

## 2018-07-03 DIAGNOSIS — R809 Proteinuria, unspecified: Secondary | ICD-10-CM | POA: Diagnosis not present

## 2018-07-03 DIAGNOSIS — R109 Unspecified abdominal pain: Secondary | ICD-10-CM | POA: Diagnosis not present

## 2018-07-03 DIAGNOSIS — I776 Arteritis, unspecified: Secondary | ICD-10-CM | POA: Diagnosis present

## 2018-07-03 DIAGNOSIS — E8809 Other disorders of plasma-protein metabolism, not elsewhere classified: Secondary | ICD-10-CM | POA: Diagnosis not present

## 2018-07-03 DIAGNOSIS — J9601 Acute respiratory failure with hypoxia: Secondary | ICD-10-CM | POA: Diagnosis not present

## 2018-07-03 DIAGNOSIS — R600 Localized edema: Secondary | ICD-10-CM | POA: Diagnosis present

## 2018-07-03 DIAGNOSIS — R222 Localized swelling, mass and lump, trunk: Secondary | ICD-10-CM | POA: Diagnosis not present

## 2018-07-03 DIAGNOSIS — R310 Gross hematuria: Secondary | ICD-10-CM | POA: Diagnosis not present

## 2018-07-03 DIAGNOSIS — N059 Unspecified nephritic syndrome with unspecified morphologic changes: Secondary | ICD-10-CM

## 2018-07-03 DIAGNOSIS — I5031 Acute diastolic (congestive) heart failure: Secondary | ICD-10-CM | POA: Diagnosis not present

## 2018-07-03 DIAGNOSIS — N898 Other specified noninflammatory disorders of vagina: Secondary | ICD-10-CM

## 2018-07-03 DIAGNOSIS — E1165 Type 2 diabetes mellitus with hyperglycemia: Secondary | ICD-10-CM | POA: Diagnosis present

## 2018-07-03 DIAGNOSIS — D649 Anemia, unspecified: Secondary | ICD-10-CM | POA: Diagnosis present

## 2018-07-03 DIAGNOSIS — B373 Candidiasis of vulva and vagina: Secondary | ICD-10-CM

## 2018-07-03 DIAGNOSIS — E119 Type 2 diabetes mellitus without complications: Secondary | ICD-10-CM

## 2018-07-03 DIAGNOSIS — Z03818 Encounter for observation for suspected exposure to other biological agents ruled out: Secondary | ICD-10-CM | POA: Diagnosis not present

## 2018-07-03 DIAGNOSIS — Z09 Encounter for follow-up examination after completed treatment for conditions other than malignant neoplasm: Secondary | ICD-10-CM

## 2018-07-03 DIAGNOSIS — N179 Acute kidney failure, unspecified: Secondary | ICD-10-CM | POA: Diagnosis not present

## 2018-07-03 DIAGNOSIS — R918 Other nonspecific abnormal finding of lung field: Secondary | ICD-10-CM | POA: Diagnosis present

## 2018-07-03 DIAGNOSIS — L292 Pruritus vulvae: Secondary | ICD-10-CM | POA: Diagnosis not present

## 2018-07-03 DIAGNOSIS — E872 Acidosis: Secondary | ICD-10-CM | POA: Diagnosis present

## 2018-07-03 DIAGNOSIS — Z20828 Contact with and (suspected) exposure to other viral communicable diseases: Secondary | ICD-10-CM | POA: Diagnosis not present

## 2018-07-03 DIAGNOSIS — E669 Obesity, unspecified: Secondary | ICD-10-CM | POA: Diagnosis present

## 2018-07-03 DIAGNOSIS — R300 Dysuria: Secondary | ICD-10-CM | POA: Diagnosis not present

## 2018-07-03 DIAGNOSIS — B3731 Acute candidiasis of vulva and vagina: Secondary | ICD-10-CM

## 2018-07-03 DIAGNOSIS — Z6841 Body Mass Index (BMI) 40.0 and over, adult: Secondary | ICD-10-CM | POA: Diagnosis not present

## 2018-07-03 DIAGNOSIS — Z880 Allergy status to penicillin: Secondary | ICD-10-CM | POA: Diagnosis not present

## 2018-07-03 DIAGNOSIS — R319 Hematuria, unspecified: Secondary | ICD-10-CM

## 2018-07-03 DIAGNOSIS — R3129 Other microscopic hematuria: Secondary | ICD-10-CM | POA: Diagnosis not present

## 2018-07-03 DIAGNOSIS — J90 Pleural effusion, not elsewhere classified: Secondary | ICD-10-CM | POA: Diagnosis not present

## 2018-07-03 LAB — CBC
HCT: 30.1 % — ABNORMAL LOW (ref 36.0–46.0)
Hemoglobin: 9.5 g/dL — ABNORMAL LOW (ref 12.0–15.0)
MCH: 27.7 pg (ref 26.0–34.0)
MCHC: 31.6 g/dL (ref 30.0–36.0)
MCV: 87.8 fL (ref 80.0–100.0)
Platelets: 474 10*3/uL — ABNORMAL HIGH (ref 150–400)
RBC: 3.43 MIL/uL — ABNORMAL LOW (ref 3.87–5.11)
RDW: 14.7 % (ref 11.5–15.5)
WBC: 17 10*3/uL — ABNORMAL HIGH (ref 4.0–10.5)
nRBC: 0 % (ref 0.0–0.2)

## 2018-07-03 LAB — COMPREHENSIVE METABOLIC PANEL
ALT: 24 U/L (ref 0–44)
AST: 26 U/L (ref 15–41)
Albumin: 2.1 g/dL — ABNORMAL LOW (ref 3.5–5.0)
Alkaline Phosphatase: 120 U/L (ref 38–126)
Anion gap: 11 (ref 5–15)
BUN: 56 mg/dL — ABNORMAL HIGH (ref 8–23)
CO2: 27 mmol/L (ref 22–32)
Calcium: 8.2 mg/dL — ABNORMAL LOW (ref 8.9–10.3)
Chloride: 98 mmol/L (ref 98–111)
Creatinine, Ser: 3.63 mg/dL — ABNORMAL HIGH (ref 0.44–1.00)
GFR calc Af Amer: 14 mL/min — ABNORMAL LOW (ref >60)
GFR calc non Af Amer: 12 mL/min — ABNORMAL LOW (ref >60)
Glucose, Bld: 135 mg/dL — ABNORMAL HIGH (ref 70–99)
Potassium: 4.5 mmol/L (ref 3.5–5.1)
Sodium: 136 mmol/L (ref 135–145)
Total Bilirubin: 0.6 mg/dL (ref 0.3–1.2)
Total Protein: 7.5 g/dL (ref 6.5–8.1)

## 2018-07-03 LAB — URINALYSIS, ROUTINE W REFLEX MICROSCOPIC
Bilirubin Urine: NEGATIVE
Glucose, UA: NEGATIVE mg/dL
Ketones, ur: NEGATIVE mg/dL
Leukocytes,Ua: NEGATIVE
Nitrite: NEGATIVE
Protein, ur: 100 mg/dL — AB
RBC / HPF: >50 RBC/hpf — ABNORMAL HIGH (ref 0–5)
Specific Gravity, Urine: 1.009 (ref 1.005–1.030)
pH: 8 (ref 5.0–8.0)

## 2018-07-03 LAB — HEMOGLOBIN A1C
Hgb A1c MFr Bld: 7.9 % — ABNORMAL HIGH (ref 4.8–5.6)
Mean Plasma Glucose: 180.03 mg/dL

## 2018-07-03 LAB — PROTEIN / CREATININE RATIO, URINE
Creatinine, Urine: 35.3 mg/dL
Protein Creatinine Ratio: 3.54 mg/mg{Cre} — ABNORMAL HIGH (ref 0.00–0.15)
Total Protein, Urine: 125 mg/dL

## 2018-07-03 LAB — GLUCOSE, CAPILLARY
Glucose-Capillary: 105 mg/dL — ABNORMAL HIGH (ref 70–99)
Glucose-Capillary: 161 mg/dL — ABNORMAL HIGH (ref 70–99)

## 2018-07-03 LAB — CBG MONITORING, ED: Glucose-Capillary: 112 mg/dL — ABNORMAL HIGH (ref 70–99)

## 2018-07-03 MED ORDER — PHENOL 1.4 % MT LIQD: 1.0000 | OROMUCOSAL | Status: DC | PRN

## 2018-07-03 MED ORDER — ENOXAPARIN SODIUM 30 MG/0.3ML ~~LOC~~ SOLN
30.0000 mg | SUBCUTANEOUS | Status: DC
  Administered 2018-07-03 – 2018-07-04 (×2): 30 mg via SUBCUTANEOUS
  Filled 2018-07-03 (×2): qty 0.3

## 2018-07-03 MED ORDER — FLUCONAZOLE 100 MG PO TABS
200.0000 mg | ORAL_TABLET | Freq: Once | ORAL | Status: AC
  Administered 2018-07-03: 200 mg via ORAL
  Filled 2018-07-03: qty 2

## 2018-07-03 MED ORDER — ACETAMINOPHEN 325 MG PO TABS
650.0000 mg | ORAL_TABLET | Freq: Four times a day (QID) | ORAL | Status: DC | PRN
  Administered 2018-07-05 – 2018-07-07 (×2): 650 mg via ORAL
  Filled 2018-07-03 (×3): qty 2

## 2018-07-03 MED ORDER — FENTANYL CITRATE (PF) 100 MCG/2ML IJ SOLN
50.0000 ug | Freq: Once | INTRAMUSCULAR | Status: AC
  Administered 2018-07-03: 50 ug via INTRAVENOUS
  Filled 2018-07-03: qty 2

## 2018-07-03 MED ORDER — SODIUM CHLORIDE 0.9 % IV SOLN
INTRAVENOUS | Status: DC
  Administered 2018-07-03 – 2018-07-04 (×2): via INTRAVENOUS

## 2018-07-03 MED ORDER — METHOCARBAMOL 500 MG PO TABS
500.0000 mg | ORAL_TABLET | Freq: Three times a day (TID) | ORAL | Status: DC | PRN
  Administered 2018-07-03 – 2018-07-07 (×2): 500 mg via ORAL
  Filled 2018-07-03 (×3): qty 1

## 2018-07-03 MED ORDER — BISACODYL 5 MG PO TBEC: 5.0000 mg | DELAYED_RELEASE_TABLET | Freq: Every day | ORAL | Status: DC | PRN

## 2018-07-03 MED ORDER — ACETAMINOPHEN 650 MG RE SUPP: 650.0000 mg | Freq: Four times a day (QID) | RECTAL | Status: DC | PRN

## 2018-07-03 MED ORDER — INSULIN ASPART 100 UNIT/ML ~~LOC~~ SOLN
0.0000 [IU] | Freq: Three times a day (TID) | SUBCUTANEOUS | Status: DC
  Administered 2018-07-04: 1 [IU] via SUBCUTANEOUS
  Administered 2018-07-04 – 2018-07-05 (×3): 2 [IU] via SUBCUTANEOUS
  Administered 2018-07-05: 1 [IU] via SUBCUTANEOUS
  Administered 2018-07-06 (×2): 2 [IU] via SUBCUTANEOUS
  Administered 2018-07-06: 3 [IU] via SUBCUTANEOUS

## 2018-07-03 MED ORDER — POLYETHYLENE GLYCOL 3350 17 G PO PACK
17.0000 g | PACK | Freq: Every day | ORAL | Status: DC | PRN
  Administered 2018-07-07: 17 g via ORAL
  Filled 2018-07-03: qty 1

## 2018-07-03 NOTE — ED Notes (Signed)
Pt reports pain and itching in her vaginal area both while urinating and not urinating. Pt reports new diagnosis of diabetes.

## 2018-07-03 NOTE — ED Notes (Signed)
[place] swab sent.  Patient taken to [place].  Report given to the floor.  Will go upstairs when [place] complete.

## 2018-07-03 NOTE — ED Notes (Signed)
Attempted report.  Nurse to call back when available. 

## 2018-07-03 NOTE — ED Provider Notes (Addendum)
[facility] EMERGENCY DEPARTMENT Provider Note   CSN: [id number] Arrival date & time: 07/03/18  1224    History   Chief Complaint Chief Complaint  Patient presents with  . Dysuria  . Leg Swelling  . sent by doctor    HPI Julia Johns is a 68 y.o. female.   Patient refuses Animal nutritionist.  She prefers to have her son translate at bedside.  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  Patient states she had laboratory work checked by her PCP earlier today and was told that her creatinine was 2.8 which is new for her.  He was also noted to have gross hematuria on her UA at her PCP office and was therefore started on Cipro yesterday.  She was also noted to have an elevated white blood cell count, elevated platelets and a low hemoglobin at 9.7.   Patient states she is having dysuria when she uses the restroom.  She has burning pain to the vaginal area.  She has not noted any abnormal discharge.  She does have a wound to the right buttock area and has some redness to the inguinal area.  She has had no fevers at home.  She has had no vomiting diarrhea or abdominal pain. C/o mild bilat flank pain.  Patient had a recent complicated admission to the hospital where she was diagnosed with a lobar pneumonia of the left lower lung mass, paravertebral mass, colitis and acute respiratory failure with hypoxia.  She was discharged from the hospital on 2L O2.  Her son states that since her discharge from the hospital that her lower extremity edema and wounds on her lower extremities have been improving.  She has had no new shortness of breath.  She has had no chest pain.  Her cough seems to be  clearing up as well.  History reviewed. No pertinent past medical history.  Patient Active Problem List   Diagnosis Date Noted  . Acute respiratory failure with hypoxia ([place])   . Bilateral lower extremity edema   . Obesity (BMI 30.0-34.9)   . Colitis 06/15/2018  . Lobar pneumonia ([place]) 06/10/2018  . 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 Mass of lower lobe of left lung 06/09/2018    Past Surgical History:  Procedure Laterality Date  . VIDEO BRONCHOSCOPY Bilateral 06/18/2018   Procedure: VIDEO BRONCHOSCOPY WITH FLUORO;  Surgeon: Rigoberto Noel, MD;  Location: [facility];  Service: Cardiopulmonary;  Laterality: Bilateral;  . VIDEO BRONCHOSCOPY WITH ENDOBRONCHIAL ULTRASOUND N/A 06/25/2018   Procedure: VIDEO BRONCHOSCOPY WITH ENDOBRONCHIAL ULTRASOUND;  Surgeon: Rigoberto Noel, MD;  Location: [facility] OR;  Service: Thoracic;  Laterality: N/A;     OB History   No obstetric history on file.      Home Medications    Prior to Admission medications   Medication Sig Start Date End Date Taking? Authorizing Provider  ciprofloxacin (CIPRO) 250 MG tablet Take 250 mg by mouth daily at 12 noon.   Yes [provider]  furosemide (LASIX) 40 MG tablet Take 1 tablet (40 mg total) by mouth 2 (two) times daily. 06/26/18 06/26/19 Yes Georgette Shell, MD  methocarbamol (ROBAXIN) 500 MG tablet Take 1 tablet (500 mg total) by mouth every 8 (eight) hours as  needed for muscle spasms. 06/26/18  Yes Georgette Shell, MD  phenol (CHLORASEPTIC) 1.4 % LIQD Use as directed 1 spray in the mouth or throat as needed for throat irritation / pain.   Yes [provider]  potassium chloride (K-DUR) 10 MEQ tablet Take 2 tablets (20 mEq total) by mouth 2 (two) times daily. 06/26/18  Yes Georgette Shell, MD  bisacodyl (DULCOLAX) 10 MG suppository Place 1 suppository (10 mg total) rectally daily as needed for moderate constipation. 06/26/18   Georgette Shell, MD  senna-docusate (SENOKOT-S) 8.6-50 MG tablet  Take 1 tablet by mouth 2 (two) times daily. 06/26/18   Georgette Shell, MD    Family History No family history on file.  Social History Social History   Tobacco Use  . Smoking status: Never Smoker  . Smokeless tobacco: Never Used  Substance Use Topics  . Alcohol use: Never    Frequency: Never  . Drug use: Never     Allergies   Penicillins   Review of Systems Review of Systems  Constitutional: Negative for fever.  HENT: Negative for ear pain and sore throat.   Eyes: Negative for visual disturbance.  Respiratory: Negative for cough and shortness of breath.   Cardiovascular: Positive for leg swelling (improving per son). Negative for chest pain.  Gastrointestinal: Negative for abdominal pain, constipation, diarrhea, nausea and vomiting.  Genitourinary: Positive for dysuria, flank pain, hematuria, vaginal discharge and vaginal pain.  Musculoskeletal: Negative for arthralgias and back pain.  Skin: Negative for rash.  Neurological: Negative for headaches.  All other systems reviewed and are negative.   Physical Exam Updated Vital Signs BP 111/67   Pulse 79   Temp 98.8 F (37.1 C) (Oral)   Resp (!) 26   Wt 93 kg   SpO2 99%   BMI 32.11 kg/m   Physical Exam Vitals signs and nursing note reviewed.  Constitutional:      General: She is not in acute distress.    Appearance: She is well-developed.  HENT:     Head: Normocephalic and atraumatic.  Eyes:     Conjunctiva/sclera: Conjunctivae normal.  Neck:     Musculoskeletal: Neck supple.  Cardiovascular:     Rate and Rhythm: Normal rate and regular rhythm.     Pulses: Normal pulses.     Heart sounds: Normal heart sounds. No murmur.  Pulmonary:     Effort: Pulmonary effort is normal. No respiratory distress.     Breath sounds: Rales present. No wheezing or rhonchi.  Abdominal:     General: Bowel sounds are normal.     Palpations: Abdomen is soft.     Tenderness: There is abdominal tenderness (mild lower abd  ttp). There is right CVA tenderness (mild) and left CVA tenderness (mild). There is no guarding or rebound.  Genitourinary:    Comments: Chaperone present during external vaginal exam. Vaginal tissue appears very erythematous and inflamed. No obvious lesions to the vagina. There is erythema to the bilat inguinal areas and an open 2x3 cm superficial ulceration to the upper inner thigh. No pus drainage from wound and no crepitus noted. Musculoskeletal:     Right lower leg: Edema present.     Left lower leg: Edema present.     Comments: Open wounds to BLE that appear improved per son  Skin:    General: Skin is warm and dry.  Neurological:     Mental Status: She is alert.    ED Treatments / Results  Labs (  all labs ordered are listed, but only abnormal results are displayed) Labs Reviewed  COMPREHENSIVE METABOLIC PANEL - Abnormal; Notable for the following components:      Result Value   Glucose, Bld 135 (*)    BUN 56 (*)    Creatinine, Ser 3.63 (*)    Calcium 8.2 (*)    Albumin 2.1 (*)    GFR calc non Af Amer 12 (*)    GFR calc Af Amer 14 (*)    All other components within normal limits  CBC - Abnormal; Notable for the following components:   WBC 17.0 (*)    RBC 3.43 (*)    Hemoglobin 9.5 (*)    HCT 30.1 (*)    Platelets 474 (*)    All other components within normal limits  CBG MONITORING, ED - Abnormal; Notable for the following components:   Glucose-Capillary 112 (*)    All other components within normal limits  URINE CULTURE  URINALYSIS, ROUTINE W REFLEX MICROSCOPIC    EKG EKG Interpretation  Date/Time:  Friday July 03 2018 12:46:44 EDT Ventricular Rate:  85 PR Interval:  136 QRS Duration: 80 QT Interval:  362 QTC Calculation: 430 R Axis:   25 Text Interpretation:  Normal sinus rhythm Low voltage QRS Nonspecific T wave abnormality Abnormal ECG No old tracing to compare Confirmed by Sherwood Gambler [phone]) on 07/03/2018 2:01:32 PM   Radiology Dg Chest Portable 1  View  Result Date: 07/03/2018 CLINICAL DATA:  Pts family states pt was discharged 1 wk ago (admitted for stomach mass and PNA). States the pt has been having dysuria x 2-3 days, also having bilateral leg swelling. Pt was told by doctor that labs yesterday reflected new onset DM and acute renal failure - sent by doc to ED for eval EXAM: PORTABLE CHEST 1 VIEW COMPARISON:  Chest CT, 06/16/2018. Prior chest radiographs, most recent dated 06/05/2018. FINDINGS: Lung volumes are low. There is opacity at both lung bases consistent with a combination of small pleural effusions and atelectasis. Pneumonia at either lung base is possible. There are prominent bronchovascular markings bilaterally, but no overt pulmonary edema. Cardiac silhouette is normal in size. No mediastinal or hilar masses. Skeletal structures are grossly intact. IMPRESSION: 1. Small effusions with bilateral lung base opacities, which are most likely due to atelectasis. Consider pneumonia if there are consistent clinical findings. Prominent bronchovascular markings accentuated by the low lung volumes, without overt pulmonary edema. Electronically Signed   By: Lajean Manes M.D.   On: 07/03/2018 15:00    Procedures Procedures (including critical care time)  Medications Ordered in ED Medications  fentaNYL (SUBLIMAZE) injection 50 mcg (50 mcg Intravenous Given 07/03/18 1528)     Initial Impression / Assessment and Plan / ED Course  I have reviewed the triage vital signs and the nursing notes.  Pertinent labs & imaging results that were available during my care of the patient were reviewed by me and considered in my medical decision making (see chart for details).     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  Creatinine:  Creatinine at patient's doctor's office  was 2.8.  Here her creatinine is 3.63.  Also noted to have hematuria. Started on cipro yesterday for possible UTI. Bladder scan 253 ml. No evidence of significant retention.   Dysuria/vaginal irritation: On exam she has erythema to the vaginal area with suspected fungal infection to the bilat inguinal area. Pressure wound also noted without crepitus noted. Doubt nec fasciitis. Will likely need abx and possibly nystatin cream for sxs. Discussed with hospitalist who will eval pt and decide on abx. Currently pending results of UA at time of admission.  She was started on Cipro yesterday by her PCP.   CBC patient with a leukocytosis to 17, this is consistent with prior.  Patient noted to be anemic with hemoglobin of 9.5 which is worse from 1 week ago as well. CMP with elevated BUN/creatinine of 56 and 3.63.  Creatinine is worse from labs checked yesterday where it was 2.84.  Albumin is persistently low at 2.1 which could be contributing to her lower extremity edema. UA is pending at the time of admission.  Patient stated that she was unable to urinate with 253 mL's in the bladder.  In and out cath will be ordered.  EKG  Normal sinus rhythm Low voltage QRS Nonspecific T wave abnormality Abnormal ECG No old tracing to compare   CXR with small effusions with bilateral lung base opacities, which are most likely due to atelectasis. Consider pneumonia if there are consistent clinical findings. Prominent bronchovascular markings accentuated by the low lung volumes, without overt pulmonary edema.  3:25 PM CONSULT with Dr. Jamse Arn who accepts pt for admission for further tx of her acute renal failure.  ED Discharge Orders    None        Rodney Booze, PA-C 07/03/18 1559    Rodney Booze, PA-C 07/03/18 1607    Sherwood Gambler, MD 07/04/18 1559

## 2018-07-03 NOTE — Telephone Encounter (Signed)
Received a call from Dr. Tobie Poet with [facility] in regards to pt. Per Dr. Tobie Poet pt saw her yesterday and Dr. Tobie Poet stated that pt seemed very dragged out.  Dr. Tobie Poet stated that pt's creatinine was 2.8 but her blood cell count was elevated.  Dr. Tobie Poet believes that pt needs to go back to the hospital for evaluation but stated she was going to call another of pt's MDs to discuss this with them and then if she did decide to send pt back to the hospital, she will call me with an update.  While speaking with Dr. Tobie Poet, she stated that pt was supposed to be coming to office at 1:30 for the appt with Tonya to discuss the results of the bronchoscopy and then decide what needs to be done next. I did read Dr. Tobie Poet the results of the bronch per her request and have faxed the results of the bronch to Dr. Tobie Poet at her request as well. Will keep encounter open until we know if pt is being sent back to the hospital by Dr. Tobie Poet.

## 2018-07-03 NOTE — Progress Notes (Signed)
NEW ADMISSION NOTE New Admission Note:   Arrival Method: via stretcher from ED Mental Orientation: axox3 Telemetry: none Assessment: Completed Skin:see assessment  IV: left arm  Pain:denies  Tubes: none  Safety Measures: Safety Fall Prevention Plan has been discussed Admission: Completed [facility] Orientation: Patient has been orientated to the room, unit and staff.  Family:none   Orders have been reviewed and implemented. Will continue to monitor the patient. Call light has been placed within reach and bed alarm has been activated.   Paulla Fore, RN

## 2018-07-03 NOTE — ED Notes (Signed)
Patient unable to void yet.  Bladder scan showing 227ml's currently.  Provider made aware.  Son at the bedside.  Encouraged to call for assistance as needed.

## 2018-07-03 NOTE — ED Notes (Signed)
Pt placed on PureWick and informed of need for pt to urinate due to order for post void residual. Informed family member to notify staff when pt urinated.

## 2018-07-03 NOTE — ED Triage Notes (Signed)
Pt Spanish-speaking, arrives with family. Per family, pt was d/c'd 1 wk ago (admitted for stomach mass and PNA). States the pt has been having dysuria x 2-3 days, also having BLE swelling. Told by doctor that labs yesterday reflected new onset DM and acute renal failure - sent by doc to ED for eval

## 2018-07-03 NOTE — ED Notes (Signed)
ED TO INPATIENT HANDOFF REPORT  ED Nurse Name and Phone #: Yevonne Yokum [phone]  S Name/Age/Gender [name] 68 y.o. female Room/Bed: 016C/016C  Code Status   Code Status: Prior  Home/SNF/Other Home Patient oriented to: self, place, time and situation Is this baseline? Yes   Triage Complete: Triage complete  Chief Complaint Painful urination, Hyperglycemia  Triage Note Pt Spanish-speaking, arrives with family. Per family, pt was d/c'd 1 wk ago (admitted for stomach mass and PNA). States the pt has been having dysuria x 2-3 days, also having BLE swelling. Told by doctor that labs yesterday reflected new onset DM and acute renal failure - sent by doc to ED for eval   Allergies Allergies  Allergen Reactions  . Penicillins Anaphylaxis    Patient stated that within a few minutes of receiving a penicillin IM injection she rapidly became unconscious and required "additional" medication to recover. This occurred when she was ~68 years old and she does not recall what was given to assist in her recovery. She did not develop a rash.     Level of Care/Admitting Diagnosis ED Disposition    ED Disposition Condition [facility]: [place] [100100]  Level of Care: Med-Surg [16]  Covid Evaluation: Screening Protocol (No Symptoms)  Diagnosis: Acute renal failure (ARF) [facility]) [003491]  Admitting Physician: Vashti Hey [[id number]]  Attending Physician: Vashti Hey [[id number]]  Estimated length of stay: past midnight tomorrow  Certification:: I certify this patient will need inpatient services for at least 2 midnights  PT Class (Do Not Modify): Inpatient [101]  PT Acc Code (Do Not Modify): Private [1]       B Medical/Surgery History History reviewed. No pertinent past medical history. Past Surgical History:  Procedure Laterality Date  . VIDEO BRONCHOSCOPY Bilateral 06/18/2018   Procedure: VIDEO BRONCHOSCOPY WITH  FLUORO;  Surgeon: Rigoberto Noel, MD;  Location: [place];  Service: Cardiopulmonary;  Laterality: Bilateral;  . VIDEO BRONCHOSCOPY WITH ENDOBRONCHIAL ULTRASOUND N/A 06/25/2018   Procedure: VIDEO BRONCHOSCOPY WITH ENDOBRONCHIAL ULTRASOUND;  Surgeon: Rigoberto Noel, MD;  Location: [place];  Service: Thoracic;  Laterality: N/A;     A IV Location/Drains/Wounds Patient Lines/Drains/Airways Status   Active Line/Drains/Airways    Name:   Placement date:   Placement time:   Site:   Days:   Peripheral IV 07/03/18 Left Hand   07/03/18    1529    Hand   less than 1   External Urinary Catheter   07/03/18    1505    -   less than 1          Intake/Output Last 24 hours  Intake/Output Summary (Last 24 hours) at 07/03/2018 1651 Last data filed at 07/03/2018 1608 Gross per 24 hour  Intake -  Output 280 ml  Net -280 ml    Labs/Imaging Results for orders placed or performed during the hospital encounter of 07/03/18 (from the past 48 hour(s))  Comprehensive metabolic panel     Status: Abnormal   Collection Time: 07/03/18 12:49 PM  Result Value Ref Range   Sodium 136 135 - 145 mmol/L   Potassium 4.5 3.5 - 5.1 mmol/L   Chloride 98 98 - 111 mmol/L   CO2 27 22 - 32 mmol/L   Glucose, Bld 135 (H) 70 - 99 mg/dL   BUN 56 (H) 8 - 23 mg/dL   Creatinine, Ser 3.63 (H) 0.44 - 1.00 mg/dL   Calcium 8.2 (L) 8.9 -  10.3 mg/dL   Total Protein 7.5 6.5 - 8.1 g/dL   Albumin 2.1 (L) 3.5 - 5.0 g/dL   AST 26 15 - 41 U/L   ALT 24 0 - 44 U/L   Alkaline Phosphatase 120 38 - 126 U/L   Total Bilirubin 0.6 0.3 - 1.2 mg/dL   GFR calc non Af Amer 12 (L) >60 mL/min   GFR calc Af Amer 14 (L) >60 mL/min   Anion gap 11 5 - 15    Comment: Performed at [facility] [address] [place], [place] [address]  CBC     Status: Abnormal   Collection Time: 07/03/18 12:49 PM  Result Value Ref Range   WBC 17.0 (H) 4.0 - 10.5 K/uL   RBC 3.43 (L) 3.87 - 5.11 MIL/uL   Hemoglobin 9.5 (L) 12.0 - 15.0 g/dL   HCT 30.1 (L) 36.0  - 46.0 %   MCV 87.8 80.0 - 100.0 fL   MCH 27.7 26.0 - 34.0 pg   MCHC 31.6 30.0 - 36.0 g/dL   RDW 14.7 11.5 - 15.5 %   Platelets 474 (H) 150 - 400 K/uL   nRBC 0.0 0.0 - 0.2 %    Comment: Performed at [facility], [place] [address] [place], [place] [address]  CBG monitoring, ED     Status: Abnormal   Collection Time: 07/03/18  1:43 PM  Result Value Ref Range   Glucose-Capillary 112 (H) 70 - 99 mg/dL  Urinalysis, Routine w reflex microscopic     Status: Abnormal   Collection Time: 07/03/18  4:04 PM  Result Value Ref Range   Color, Urine YELLOW YELLOW   APPearance CLEAR CLEAR   Specific Gravity, Urine 1.009 1.005 - 1.030   pH 8.0 5.0 - 8.0   Glucose, UA NEGATIVE NEGATIVE mg/dL   Hgb urine dipstick LARGE (A) NEGATIVE   Bilirubin Urine NEGATIVE NEGATIVE   Ketones, ur NEGATIVE NEGATIVE mg/dL   Protein, ur 100 (A) NEGATIVE mg/dL   Nitrite NEGATIVE NEGATIVE   Leukocytes,Ua NEGATIVE NEGATIVE   RBC / HPF >50 (H) 0 - 5 RBC/hpf   WBC, UA 0-5 0 - 5 WBC/hpf   Bacteria, UA RARE (A) NONE SEEN   Squamous Epithelial / LPF 0-5 0 - 5    Comment: Performed at [facility], [place] [address], [place], [place] [address]   Dg Chest Portable 1 View  Result Date: 07/03/2018 CLINICAL DATA:  Pts family states pt was discharged 1 wk ago (admitted for stomach mass and PNA). States the pt has been having dysuria x 2-3 days, also having bilateral leg swelling. Pt was told by doctor that labs yesterday reflected new onset DM and acute renal failure - sent by doc to ED for eval EXAM: PORTABLE CHEST 1 VIEW COMPARISON:  Chest CT, 06/16/2018. Prior chest radiographs, most recent dated 06/05/2018. FINDINGS: Lung volumes are low. There is opacity at both lung bases consistent with a combination of small pleural effusions and atelectasis. Pneumonia at either lung base is possible. There are prominent bronchovascular markings bilaterally, but no overt pulmonary edema. Cardiac silhouette is normal in size. No  mediastinal or hilar masses. Skeletal structures are grossly intact. IMPRESSION: 1. Small effusions with bilateral lung base opacities, which are most likely due to atelectasis. Consider pneumonia if there are consistent clinical findings. Prominent bronchovascular markings accentuated by the low lung volumes, without overt pulmonary edema. Electronically Signed   By: Lajean Manes M.D.   On: 07/03/2018  15:00    Pending Labs Unresulted Labs (From admission, onward)    Start     Ordered   07/03/18 1556  Urine culture  ONCE - STAT,   STAT     07/03/18 1555          Vitals/Pain Today's Vitals   07/03/18 1456 07/03/18 1515 07/03/18 1528 07/03/18 1530  BP:  (!) 110/53  111/67  Pulse:  83  79  Resp:  (!) 31  (!) 26  Temp:      TempSrc:      SpO2:  97%  99%  Weight:      PainSc: 8   8      Isolation Precautions No active isolations  Medications Medications  fentaNYL (SUBLIMAZE) injection 50 mcg (50 mcg Intravenous Given 07/03/18 1528)    Mobility walks Moderate fall risk   Focused Assessments Cardiac Assessment Handoff:    No results found for: CKTOTAL, CKMB, CKMBINDEX, TROPONINI No results found for: DDIMER Does the Patient currently have chest pain? No      R Recommendations: See Admitting Provider Note  Report given to:   Additional Notes: patient alert and oriented.  Spanish speaking.

## 2018-07-03 NOTE — H&P (Addendum)
History and Physical:    Julia Johns   SEG:[id number] DOB: 16-Jul-1950 DOA: 07/03/2018  Referring MD/provider: PA Couture  PCP: Patient, No Pcp Per   Patient coming from: Home  Chief Complaint: Patient told to come to ED by PCP upon review of routine labs done yesterday.  History as per patient's son and patient as well as chart review.  Patient's son was acting as Optometrist.  History of Present Illness:   Julia Johns is an 68 y.o. female who was recently discharged from [facility] 06/26/2018 for work-up of shortness of breath and hemoptysis.  Extensive work-up in house revealed 4 x 3 cm LLL mass-like consolidation as well as paraspinal fluid collection.  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  Patient underwent a bronchoscopy on 5/28 and repeat bronchoscopy on 6/4 with inconclusive pathology findings but no clear evidence of malignancy.  Patient was seen for routine posthospitalization follow-up at [facility] yesterday.  At that time she complained of dysuria.  UA showed hematuria and patient was started on ciprofloxacin.  Patient was called by [facility] today telling her to come to the ED because she was in acute renal failure.  Patient son states that he thinks his mother is doing better now than she had been doing 2 weeks ago upon discharge.  He notes that her lower extremity edema is improved as well as the rash on her lower extremities are improved.  He also thinks that she is coughing less.  She has not needed to use her home oxygen much at all.  Patient denies fevers or chills.  She does admit to ongoing significant weakness but not different from a month ago when she was admitted.  Patient's main concern is burning and discomfort in her vaginal area.  She also has burning upon urination.  She denies any frequency  or urgency.  Of note patient had outpatient follow-up with the pulmonologist today however missed that appointment because she was told to come to ED.  Patient denies hemoptysis.  She does have ongoing cough although improved from 2 weeks ago.  She does have ongoing dyspnea on exertion and weakness as noted above.  Patient and son deny previous history of diabetes.  He notes that they were told that she has diabetes today by PCP.  Patient does admit to intermittent abdominal pain as before.  Denies any diarrhea, is not sure if she has constipation or not.  No hematemesis or hematochezia.  No melena.  Patient denies any back pain.  ED Course:  The patient was noted to have worsened kidney function than even yesterday.  Creatinine had increased to 3.6 today up from 2.8 yesterday.  Her renal function was normal on discharge.  ROS:   ROS   Review of Systems: General: No fever, chills, weight changes\ Eyes: no discharge, redness, pain HENT: no ear pain, hearing loss, drainage, tinnitus Endocrine: no heat/cold intolerance,  Respiratory: Positive cough,, shortness of breath, no hemoptysis Cardiovascular: No palpitations, chest pain GI: No nausea, vomiting, diarrhea, CNS: No numbness, dizziness, headache   Past Medical History:   History reviewed. No pertinent past medical history.  Past Surgical History:   Past Surgical History:  Procedure Laterality Date  . VIDEO BRONCHOSCOPY Bilateral 06/18/2018   Procedure: VIDEO BRONCHOSCOPY WITH FLUORO;  Surgeon: Rigoberto Noel, MD;  Location[facility]  ENDOSCOPY;  Service: Cardiopulmonary;  Laterality: Bilateral;  . VIDEO BRONCHOSCOPY WITH ENDOBRONCHIAL ULTRASOUND N/A 06/25/2018   Procedure: VIDEO BRONCHOSCOPY WITH ENDOBRONCHIAL ULTRASOUND;  Surgeon: Rigoberto Noel, MD;  Location: [place];  Service: Thoracic;  Laterality: N/A;    Social History:   Social History   Socioeconomic History  . Marital status: Single    Spouse name: Not on file  . Number  of children: Not on file  . Years of education: Not on file  . Highest education level: Not on file  Occupational History  . Not on file  Social Needs  . Financial resource strain: Not on file  . Food insecurity    Worry: Not on file    Inability: Not on file  . Transportation needs    Medical: Not on file    Non-medical: Not on file  Tobacco Use  . Smoking status: Never Smoker  . Smokeless tobacco: Never Used  Substance and Sexual Activity  . Alcohol use: Never    Frequency: Never  . Drug use: Never  . Sexual activity: Not on file  Lifestyle  . Physical activity    Days per week: Not on file    Minutes per session: Not on file  . Stress: Not on file  Relationships  . Social Herbalist on phone: Not on file    Gets together: Not on file    Attends religious service: Not on file    Active member of club or organization: Not on file    Attends meetings of clubs or organizations: Not on file    Relationship status: Not on file  . Intimate partner violence    Fear of current or ex partner: Not on file    Emotionally abused: Not on file    Physically abused: Not on file    Forced sexual activity: Not on file  Other Topics Concern  . Not on file  Social History Narrative  . Not on file    Allergies   Penicillins  Family history:   No family history on file.  Current Medications:   Prior to Admission medications   Medication Sig Start Date End Date Taking? Authorizing Provider  ciprofloxacin (CIPRO) 250 MG tablet Take 250 mg by mouth daily at 12 noon.   Yes [provider]  furosemide (LASIX) 40 MG tablet Take 1 tablet (40 mg total) by mouth 2 (two) times daily. 06/26/18 06/26/19 Yes Georgette Shell, MD  methocarbamol (ROBAXIN) 500 MG tablet Take 1 tablet (500 mg total) by mouth every 8 (eight) hours as needed for muscle spasms. 06/26/18  Yes Georgette Shell, MD  phenol (CHLORASEPTIC) 1.4 % LIQD Use as directed 1 spray in the mouth or throat  as needed for throat irritation / pain.   Yes [provider]  potassium chloride (K-DUR) 10 MEQ tablet Take 2 tablets (20 mEq total) by mouth 2 (two) times daily. 06/26/18  Yes Georgette Shell, MD  bisacodyl (DULCOLAX) 10 MG suppository Place 1 suppository (10 mg total) rectally daily as needed for moderate constipation. 06/26/18   Georgette Shell, MD  senna-docusate (SENOKOT-S) 8.6-50 MG tablet Take 1 tablet by mouth 2 (two) times daily. 06/26/18   Georgette Shell, MD    Physical Exam:   Vitals:   07/03/18 1430 07/03/18 1445 07/03/18 1515 07/03/18 1530  BP: (!) 109/55 102/61 (!) 110/53 111/67  Pulse: 80 88 83 79  Resp: (!) 26 20 (!) 31 (!) 26  Temp:      TempSrc:      SpO2: 97% 92% 97% 99%  Weight:         Physical Exam: Blood pressure 111/67, pulse 79, temperature 98.8 F (37.1 C), temperature source Oral, resp. rate (!) 26, weight 93 kg, SpO2 99 %. Gen: Obese but otherwise comfortable appearing patient lying in bed at 30 degrees with attentive son at bedside in no acute distress. Eyes: Sclerae anicteric. Conjunctiva mildly injected. Chest: Diminished air entry bilaterally likely secondary to diminished inspiratory effort.  Low lung volumes.  She has decreased breath sounds at bases.  No wheezes. CV: Distant, regular, no audible murmurs. Abdomen: NABS, soft, nondistended, nontender.  No rebound, no guarding.  Patient does have irritation of her vaginal mucosa with beefy red vaginitis. Extremities: Patient has 1-2+ dependent edema with areas of stage II ulceration in dorsum of feet bilaterally.  She does have surrounding erythema around the areas of skin breakdown however no clear cellulitis. Neuro: Alert and oriented times 3; grossly nonfocal. Psych: Patient is cooperative, logical and coherent with appropriate mood and affect.  Data Review:    Labs: Basic Metabolic Panel: Recent Labs  Lab 07/03/18 1249  NA 136  K 4.5  CL 98  CO2 27  GLUCOSE 135*  BUN  56*  CREATININE 3.63*  CALCIUM 8.2*   Liver Function Tests: Recent Labs  Lab 07/03/18 1249  AST 26  ALT 24  ALKPHOS 120  BILITOT 0.6  PROT 7.5  ALBUMIN 2.1*   No results for input(s): LIPASE, AMYLASE in the last 168 hours. No results for input(s): AMMONIA in the last 168 hours. CBC: Recent Labs  Lab 07/03/18 1249  WBC 17.0*  HGB 9.5*  HCT 30.1*  MCV 87.8  PLT 474*   Cardiac Enzymes: No results for input(s): CKTOTAL, CKMB, CKMBINDEX, TROPONINI in the last 168 hours.  BNP (last 3 results) No results for input(s): PROBNP in the last 8760 hours. CBG: Recent Labs  Lab 07/03/18 1343  GLUCAP 112*    Urinalysis    Component Value Date/Time   COLORURINE YELLOW 07/03/2018 1604   APPEARANCEUR CLEAR 07/03/2018 1604   LABSPEC 1.009 07/03/2018 1604   PHURINE 8.0 07/03/2018 1604   GLUCOSEU NEGATIVE 07/03/2018 1604   HGBUR LARGE (A) 07/03/2018 1604   BILIRUBINUR NEGATIVE 07/03/2018 1604   KETONESUR NEGATIVE 07/03/2018 1604   PROTEINUR 100 (A) 07/03/2018 1604   NITRITE NEGATIVE 07/03/2018 1604   LEUKOCYTESUR NEGATIVE 07/03/2018 1604      Radiographic Studies: Dg Chest Portable 1 View  Result Date: 07/03/2018 CLINICAL DATA:  Pts family states pt was discharged 1 wk ago (admitted for stomach mass and PNA). States the pt has been having dysuria x 2-3 days, also having bilateral leg swelling. Pt was told by doctor that labs yesterday reflected new onset DM and acute renal failure - sent by doc to ED for eval EXAM: PORTABLE CHEST 1 VIEW COMPARISON:  Chest CT, 06/16/2018. Prior chest radiographs, most recent dated 06/05/2018. FINDINGS: Lung volumes are low. There is opacity at both lung bases consistent with a combination of small pleural effusions and atelectasis. Pneumonia at either lung base is possible. There are prominent bronchovascular markings bilaterally, but no overt pulmonary edema. Cardiac silhouette is normal in size. No mediastinal or hilar masses. Skeletal  structures are grossly intact. IMPRESSION: 1. Small effusions with bilateral lung base opacities, which are most likely due to atelectasis. Consider pneumonia if there are consistent clinical findings. Prominent bronchovascular markings accentuated by the  low lung volumes, without overt pulmonary edema. Electronically Signed   By: Lajean Manes M.D.   On: 07/03/2018 15:00    EKG: Independently reviewed.  Sinus rhythm at 88.  Normal intervals.  Normal axis.  I will T wave inversions V1 through V3.  Relatively low voltage throughout.   Assessment/Plan:   Principal Problem:   Acute renal failure (ARF) (HCC) Active Problems:   Paravertebral mass   Mass of lower lobe of left lung   Bilateral lower extremity edema   Hematuria   Yeast infection involving the vagina and surrounding area   Hypoalbuminemia   Dysuria   68 year old female with market hypoalbuminemia presents with hematuria, acute renal failure 2 weeks after inpatient work-up for pleural effusions, possible lung mass and possible paravertebral fluid collection.  Patient has new onset diabetes and what appears to be a candidal vaginitis.   ACUTE RENAL FAILURE Initial cause of acute renal failure found at PCP yesterday is not entirely clear, although she does have hematuria and proteinuria potentially suggestive of a nephritic versus nephrotic syndrome. Worsened renal function today may also partially be secondary to ciprofloxacin started yesterday. Stop all renal toxic medication. Will also hold patient's Lasix. Hydrate gently overnight and repeat creatinine in the morning. Renal ultrasound requested. If creatinine not improved with conservative treatment would consider renal consultation for possible nephritic or nephrotic syndrome.  HYPOALBUMINEMIA With significant proteinuria and albumin of 2.1 along with lower extremity edema. She may have some level of nephrotic syndrome Protein creatinine ratio requested.  HEMATURIA  Patient with hematuria on UA no casts seen on microscopy. She does have a vaginitis which may be contaminating the urine. As noted above renal ultrasound is ordered. Can consider repeat UA with catheterized urine if creatinine not improved with hydration.  VAGINITIS Patient appears to have a candidal vaginitis very possibly secondary to her untreated diabetes. Will treat with fluconazole 200 mg p.o. x1 now  NEW ONSET DIABETES Patient with hemoglobin A1c at 7.8 at PCPs yesterday. She has had persistent hyperglycemia while in house. Will start patient on sliding scale insulin before meals at bedtime along with a carb controlled diet.  LEUKOCYTOSIS Patient with worsened leukocytosis but no fever. UA is nitrite and leukoesterase negative. Lung findings appear to be improved on chest x-ray and by history No clear abdominal discomfort or diarrhea although she did have stranding on abdominal CT 6 weeks ago. She does have some erythema in her lower extremities but no clear cellulitis as yet. Given no clear source of infection, I am not starting any antibiotics at present.  LE EDEMA AND RASH Lower extremity IMA very possibly secondary to hypoalbuminemia. She has been discharged on Lasix 40 mg orally which has helped with her lower extremity edema per son's report however I am holding this for now as this may have contributed to intravascular volume depletion and renal failure.  LUNG MASS Patient had a masslike consolidation with bilateral pleural effusions with bronchoscopy negative for malignancy.  Patient's son thinks that she is improving. Chest x-ray today shows decreased effusions and possible resolution of mass. Have not ordered a chest CT today given poor renal function however a contrasted CT might be beneficial once her kidney function has improved.  PARAVERTEBRAL FLUID COLLECTION Patient has no back pain or focal symptoms other than generalized weakness. Repeat imaging per  inpatient team as warranted once kidney function improves.   Other information:   DVT prophylaxis: Lovenox ordered. Code Status: Full code. Family Communication: Patient son was  at bedside throughout Disposition Plan: Home Consults called: None Admission status: Inpatient  The medical decision making on this patient was of high complexity and the patient is at high risk for clinical deterioration, therefore this is a level 3 visit.    Dewaine Oats Tublu Chatterjee [facility]  If 7PM-7AM, please contact night-coverage [email or web address] Password [facility] 07/03/2018, 4:47 PM

## 2018-07-04 DIAGNOSIS — R300 Dysuria: Secondary | ICD-10-CM

## 2018-07-04 LAB — GLUCOSE, CAPILLARY
Glucose-Capillary: 125 mg/dL — ABNORMAL HIGH (ref 70–99)
Glucose-Capillary: 165 mg/dL — ABNORMAL HIGH (ref 70–99)
Glucose-Capillary: 135 mg/dL — ABNORMAL HIGH (ref 70–99)
Glucose-Capillary: 114 mg/dL — ABNORMAL HIGH (ref 70–99)
Glucose-Capillary: 151 mg/dL — ABNORMAL HIGH (ref 70–99)

## 2018-07-04 LAB — PROCALCITONIN: Procalcitonin: 0.29 ng/mL

## 2018-07-04 LAB — URINE CULTURE: Culture: NO GROWTH

## 2018-07-04 LAB — COMPREHENSIVE METABOLIC PANEL
ALT: 21 U/L (ref 0–44)
AST: 26 U/L (ref 15–41)
Albumin: 1.8 g/dL — ABNORMAL LOW (ref 3.5–5.0)
Alkaline Phosphatase: 119 U/L (ref 38–126)
Anion gap: 11 (ref 5–15)
BUN: 57 mg/dL — ABNORMAL HIGH (ref 8–23)
CO2: 24 mmol/L (ref 22–32)
Calcium: 7.9 mg/dL — ABNORMAL LOW (ref 8.9–10.3)
Chloride: 105 mmol/L (ref 98–111)
Creatinine, Ser: 3.91 mg/dL — ABNORMAL HIGH (ref 0.44–1.00)
GFR calc Af Amer: 13 mL/min — ABNORMAL LOW (ref >60)
GFR calc non Af Amer: 11 mL/min — ABNORMAL LOW (ref >60)
Glucose, Bld: 168 mg/dL — ABNORMAL HIGH (ref 70–99)
Potassium: 4.2 mmol/L (ref 3.5–5.1)
Sodium: 140 mmol/L (ref 135–145)
Total Bilirubin: 0.7 mg/dL (ref 0.3–1.2)
Total Protein: 6.5 g/dL (ref 6.5–8.1)

## 2018-07-04 LAB — CBC
HCT: 28.5 % — ABNORMAL LOW (ref 36.0–46.0)
Hemoglobin: 9.1 g/dL — ABNORMAL LOW (ref 12.0–15.0)
MCH: 27.9 pg (ref 26.0–34.0)
MCHC: 31.9 g/dL (ref 30.0–36.0)
MCV: 87.4 fL (ref 80.0–100.0)
Platelets: 442 10*3/uL — ABNORMAL HIGH (ref 150–400)
RBC: 3.26 MIL/uL — ABNORMAL LOW (ref 3.87–5.11)
RDW: 14.8 % (ref 11.5–15.5)
WBC: 17.5 10*3/uL — ABNORMAL HIGH (ref 4.0–10.5)
nRBC: 0 % (ref 0.0–0.2)

## 2018-07-04 LAB — NOVEL CORONAVIRUS, NAA (HOSP ORDER, SEND-OUT TO REF LAB; TAT 18-24 HRS): SARS-CoV-2, NAA: NOT DETECTED

## 2018-07-04 MED ORDER — LIVING WELL WITH DIABETES BOOK - IN SPANISH
Freq: Once | Status: DC
  Filled 2018-07-04: qty 1

## 2018-07-04 MED ORDER — GLIPIZIDE 5 MG PO TABS
5.0000 mg | ORAL_TABLET | Freq: Every day | ORAL | Status: DC
  Administered 2018-07-04 – 2018-07-06 (×3): 5 mg via ORAL
  Filled 2018-07-04 (×4): qty 1

## 2018-07-04 MED ORDER — SODIUM CHLORIDE 0.9 % IV SOLN
500.0000 mg | Freq: Two times a day (BID) | INTRAVENOUS | Status: DC
  Administered 2018-07-04 – 2018-07-06 (×4): 500 mg via INTRAVENOUS
  Filled 2018-07-04 (×5): qty 4

## 2018-07-04 NOTE — Plan of Care (Signed)
  Problem: Health Behavior/Discharge Planning: Goal: Ability to manage health-related needs will improve Outcome: Progressing   Problem: Clinical Measurements: Goal: Ability to maintain clinical measurements within normal limits will improve Outcome: Progressing   Problem: Clinical Measurements: Goal: Will remain free from infection Outcome: Progressing   

## 2018-07-04 NOTE — Progress Notes (Signed)
Inpatient Diabetes Program Recommendations  AACE/ADA: New Consensus Statement on Inpatient Glycemic Control (2015)  Target Ranges:  Prepandial:   less than 140 mg/dL      Peak postprandial:   less than 180 mg/dL (1-2 hours)      Critically ill patients:  140 - 180 mg/dL   Lab Results  Component Value Date   GLUCAP 114 (H) 07/04/2018   HGBA1C 7.9 (H) 07/03/2018    Review of Glycemic Control Results for Julia Johns, Julia Johns (MRN [id number]) as of 07/04/2018 16:52  Ref. Range 07/03/2018 21:44 07/04/2018 06:54 07/04/2018 09:03 07/04/2018 11:29 07/04/2018 16:07  Glucose-Capillary Latest Ref Range: 70 - 99 mg/dL 161 (H) 125 (H) 165 (H) 135 (H) 114 (H)   Diabetes history: New Dx. Of DM Outpatient Diabetes medications: None Current orders for Inpatient glycemic control:  Novolog sensitive tid with meals  Glipzide 5 g daily  Inpatient Diabetes Program Recommendations:    Note new diagnosis of DM.  Ordered patient LWWD booklet in Spanish.  Will see patient on 6/15 as DM coordinator working remotely today.   Thanks,  Adah Perl, RN, BC-ADM Inpatient Diabetes Coordinator Pager [phone] (8a-5p)

## 2018-07-04 NOTE — Progress Notes (Signed)
Nurse tried to start to educate patient about diabetes by starting on Spanish educational channel but the patient said ''no'' .Nurse encourage her to watch the spanish education channel by leaving the television on but the patient just look on the other side of the room and place her hands on to her ears.With this reactions from the patient,nurse turn off the television and on this patient said to the nurse ''thank you''.

## 2018-07-04 NOTE — Consult Note (Addendum)
Renal Service Consult Note [facility]  Julia Johns 07/04/2018 Sol Blazing Requesting Physician:  Dr. Jamesetta Geralds  Reason for Consult:  Acute renal failure HPI: The patient is a 68 y.o. year-old with no sig PMH admitted here 5/19 - 06/26/18 for SOB w/ blood-tinged sputum and chest pain.  CT chest showed no PE, but showed LLL density 3x5 cm mass w/ surrounding infiltrate, and also some prevertebral densities malig vs infection. She was treated w/ IV abx for PNA, underwent bronch on 5/28 and prelim reports suggested malignancy so IV abx were stopped. Pt was dc'd home to f/u w/ pulm in 2 wks for results of pathology. She had bilat prox musc weakness also, LE edema felt to be acute diast CHF and got IV lasix. ID consulted for leg wounds, see prior notes.  outpt derm f/u was recommended. CT abd for abd pain showed sigmoid colitis w/ o abscess or perforation.   Pt now admitted a couple weeks later and after presenting for dysuria, bilat LE edeam and PCP labs that showed new renal failure. Pt admitte,d creat is up to 3.91, it was 0.91 on 6/3 and 1.18 on 6/4.    Pt had contrast on last admit w/ abd CTon 5/24 and /w CTA chest on 5/26, however her creat post-contrast remained stable at 0.8- 1.0 up until dc on 6/4- 6/5.    Pt denies any SOB, there is bloody sputum which she produced during interview.  +dysuria.  No blood in urine.    ROS  denies CP  no joint pain   no HA  no blurry vision  no rash  no diarrhea  no nausea/ vomiting   Past Medical History History reviewed. No pertinent past medical history. Past Surgical History  Past Surgical History:  Procedure Laterality Date  . VIDEO BRONCHOSCOPY Bilateral 06/18/2018   Procedure: VIDEO BRONCHOSCOPY WITH FLUORO;  Surgeon: Rigoberto Noel, MD;  Location: [place];  Service: Cardiopulmonary;  Laterality: Bilateral;  . VIDEO BRONCHOSCOPY WITH ENDOBRONCHIAL ULTRASOUND N/A 06/25/2018   Procedure: VIDEO BRONCHOSCOPY  WITH ENDOBRONCHIAL ULTRASOUND;  Surgeon: Rigoberto Noel, MD;  Location: [place];  Service: Thoracic;  Laterality: N/A;   Family History No family history on file. Social History  reports that she has never smoked. She has never used smokeless tobacco. She reports that she does not drink alcohol or use drugs. Allergies  Allergies  Allergen Reactions  . Penicillins Anaphylaxis    Patient stated that within a few minutes of receiving a penicillin IM injection she rapidly became unconscious and required "additional" medication to recover. This occurred when she was ~68 years old and she does not recall what was given to assist in her recovery. She did not develop a rash.    Home medications Prior to Admission medications   Medication Sig Start Date End Date Taking? Authorizing Provider  ciprofloxacin (CIPRO) 250 MG tablet Take 250 mg by mouth daily at 12 noon.   Yes [provider]  furosemide (LASIX) 40 MG tablet Take 1 tablet (40 mg total) by mouth 2 (two) times daily. 06/26/18 06/26/19 Yes Georgette Shell, MD  methocarbamol (ROBAXIN) 500 MG tablet Take 1 tablet (500 mg total) by mouth every 8 (eight) hours as needed for muscle spasms. 06/26/18  Yes Georgette Shell, MD  phenol (CHLORASEPTIC) 1.4 % LIQD Use as directed 1 spray in the mouth or throat as needed for throat irritation / pain.   Yes [provider]  potassium chloride (K-DUR)  10 MEQ tablet Take 2 tablets (20 mEq total) by mouth 2 (two) times daily. 06/26/18  Yes Georgette Shell, MD  bisacodyl (DULCOLAX) 10 MG suppository Place 1 suppository (10 mg total) rectally daily as needed for moderate constipation. 06/26/18   Georgette Shell, MD  senna-docusate (SENOKOT-S) 8.6-50 MG tablet Take 1 tablet by mouth 2 (two) times daily. 06/26/18   Georgette Shell, MD   Liver Function Tests Recent Labs  Lab 07/03/18 1249 07/04/18 1345  AST 26 26  ALT 24 21  ALKPHOS 120 119  BILITOT 0.6 0.7  PROT 7.5 6.5  ALBUMIN  2.1* 1.8*   No results for input(s): LIPASE, AMYLASE in the last 168 hours. CBC Recent Labs  Lab 07/03/18 1249 07/04/18 1345  WBC 17.0* 17.5*  HGB 9.5* 9.1*  HCT 30.1* 28.5*  MCV 87.8 87.4  PLT 474* 379*   Basic Metabolic Panel Recent Labs  Lab 07/03/18 1249 07/04/18 1345  NA 136 140  K 4.5 4.2  CL 98 105  CO2 27 24  GLUCOSE 135* 168*  BUN 56* 57*  CREATININE 3.63* 3.91*  CALCIUM 8.2* 7.9*   Iron/TIBC/Ferritin/ %Sat No results found for: IRON, TIBC, FERRITIN, IRONPCTSAT  Vitals:   07/03/18 2047 07/04/18 0518 07/04/18 0933 07/04/18 1609  BP: (!) 86/73 (!) 109/57 (!) 108/57 112/71  Pulse: (!) 110 91 88 85  Resp: 18 19 18 18   Temp: 99.2 F (37.3 C) 99.1 F (37.3 C) 98.3 F (36.8 C) 98.6 F (37 C)  TempSrc: Oral Oral Oral Oral  SpO2: 91% 92% 95% 96%  Weight: 88.1 kg     Height:           Exam Gen alert, coughing up bloody phlegm, not in distress, nasal O2 No rash, cyanosis or gangrene Sclera anicteric, throat clear  No jvd, no LAN Chest rales R base, L clear RRR no MRG Abd soft ntnd no mass or ascites +bs obese GU defer MS no joint effusions or deformity Ext 2+ diffuse pretib and pedal edema, ulcer 1x 1 cm on lateral forefoot bilat Neuro is alert, Ox 3 , nf, no asterixis    Home meds:  - furosemide 40 bid/ Kdur 20 bid  - cipro 250 qd  - prn's/ vitamins/ supplements   UA 6/12 > 100 prot, >50 rbc, 0-5 wbc  Urine prot 125, U creat 3.54, urine prot: creat ratio = 3.54  Renal US > IMPRESSION: Borderline increased renal parenchymal echogenicity on the left suggesting medical renal disease. The left kidney is larger than the right. 13.1 cm L, 12.9 cm R.  No renal masses, stones or hydronephrosis. ECHO 06/16/18 > LVEF 60-65 %  EKG 6/12 > normal EKG, NSR  CXR 6/12 > IMPRESSION: Small effusions with bilateral lung base opacities, which are most likely due to atelectasis. Consider pneumonia if there are consistent clinical findings. Prominent  bronchovascular markings accentuated by the low lung volumes, without overt pulmonary edema.      Assessment/ Plan: 1. Renal failure - acute , renal US w/o hydro, normal appearing kidneys.  UA +microhematuria and + proteinuria ( 3.5 gm last admit).  Pt recently admitted for blood-tinged sputum , ?PNA , treated w/ IV abx x 10days and dc'd after biopsies of lung tissue.  She now presents w/ new renal failure, no hx DM / HTN/ renal disease, vol overload w/ LE and possibly pulm edema.  Lung biopsies were neg for malignancy.  Also has a couple of shallow ulcers on her feet.  Concern for vasculitis/ GN.  Urine sediment +RBC casts and degenerating casts.  Place foley. Will start empiric IV bolus steroids, check serologies, and consult IR for renal biopsy. Have explained to patient via interpreter who agrees to all the above.    2. Vol excess - LE edema on exam, CXR looks wet, nasal O2, will dc IVF's. Low threshold for IV lasix if get's SOB, but w/ soft BP's / low alb will hold off for now.  3. Hypoalbuminemia - 3rd spacing from inflammatory state and/or urine protein losses.  No hx liver disease.     Kelly Splinter  MD 07/04/2018, 4:59 PM

## 2018-07-04 NOTE — Progress Notes (Signed)
Spoke with [name], pt's daughter and updated her.

## 2018-07-04 NOTE — Progress Notes (Signed)
  RD consulted for nutrition education regarding diabetes.   Lab Results  Component Value Date   HGBA1C 7.9 (H) 07/03/2018    RD provided "Carbohydrate Counting for People with Diabetes" handout from the Academy of [employer] and Dietetics. Discussed different food groups and their effects on blood sugar, emphasizing carbohydrate-containing foods. Provided list of carbohydrates and recommended serving sizes of common foods.  Discussed importance of controlled and consistent carbohydrate intake throughout the day. Provided examples of ways to balance meals/snacks and encouraged intake of high-fiber, whole grain complex carbohydrates. Teach back method used.  RD working remotely. Started education with interpreter via phone. Discussed A1C reading and what her result means. Pt confused how she has diabetes. Endorses multiple diagnosis in her family. Discussed the importance of medication compliance and eating consistent carbs throughout the day. Started talking about which foods contain carbs and the line got disconnected. Called pt back three more times and she did not answer. Relayed this to nursing. Per RN, he attempted to start education video and pt turned her head. RD to leave handout in spanish in d/c instructions. Pt will likely benefit from outpatient education.   Expect fair compliance.  Body mass index is 30.42 kg/m. Pt meets criteria for obese based on current BMI.  Current diet order is carb modified, patient is consuming approximately 85-100% of meals at this time. Labs and medications reviewed. No further nutrition interventions warranted at this time. RD contact information provided. If additional nutrition issues arise, please re-consult RD.  Mariana Single RD, LDN Clinical Nutrition Pager # [phone]

## 2018-07-04 NOTE — Progress Notes (Signed)
PROGRESS NOTE    Patient: Julia Johns                            PCP: Patient, No Pcp Per                    DOB: 07/25/1950            DOA: 07/03/2018 NWG:[id number]             DOS: 07/04/2018, 2:55 PM   LOS: 1 day   Date of Service: The patient was seen and examined on 07/04/2018  Subjective:   The patient was seen and examined this morning, stable sitting in chair no acute distress. Noted for worsening kidney function, no adequate urine output recorded CBG 125, 135   Brief Narrative:   Julia Johns is an 68 y.o. female who was recently discharged from [facility] 06/26/2018 for work-up of shortness of breath and hemoptysis.  Extensive work-up in house revealed 4 x 3 cm LLL mass-like consolidation as well as paraspinal fluid collection.  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   Patient underwent a bronchoscopy on 06/18/2018 and repeat bronchoscopy on 06/25/2018 with inconclusive pathology findings but no clear evidence of malignancy.  Patient was seen for routine posthospitalization follow-up at [facility] yesterday.  At that time she complained of dysuria.  UA showed hematuria and patient was started on ciprofloxacin.  Patient was called by [facility] today telling her to come to the ED because she was in Acute Renal Failure. Apparently patient has been now diagnosed with diabetes mellitus  ED Course:  The patient was noted to have worsened kidney function than even yesterday.  Creatinine had increased to 3.6 today up from 2.8 yesterday.  Her renal function was normal on discharge  Assessment & Plan:    Principal Problem:   Acute renal failure (ARF) (HCC) Active Problems:   Paravertebral mass   Mass of lower lobe of left lung   Bilateral lower extremity edema   Hematuria   Yeast infection involving the vagina and  surrounding area   Hypoalbuminemia   Dysuria    ACUTE RENAL FAILURE -Worsening renal function  -Nephritic syndrome, proteinuria, hematuria (less likely nephrotic) -May have been exacerbated by diuretics Lasix on hold now -Pending renal ultrasound -Creatinine 3.6 >> 3.91 -Avoiding nephrotoxins -Continue gentle fluid hydration -Urine output 350 ml  -If no improvement considering consulting nephrology   HYPOALBUMINEMIA With significant proteinuria and albumin of 2.1 along with lower extremity edema. She may have some level of nephrotic syndrome Protein creatinine ratio requested.  HEMATURIA -Mild Patient with hematuria on UA no casts seen on microscopy. She does have a vaginitis which may be contaminating the urine. As noted above renal ultrasound is ordered.   VAGINITIS -Currently asymptomatic Patient appears to have a candidal vaginitis very possibly secondary to her untreated diabetes. Will treat with fluconazole 200 mg p.o. x1 now  NEW ONSET DIABETES Patient with hemoglobin A1c at 7.9 -Consulted for diabetic education, dietitian for diabetic diet -Checking CBG QAC/HS, with SSI -Started the patient on glipizide (over metformin due to AKI)    LEUKOCYTOSIS -No signs of overt infection -noted for no fever or chills, -Likely reactive, monitoring very closely -Withholding antibiotics -UA within normal limits, imaging clear, recent CT of  abdomen pelvis reviewed -Mild erythema lower extremities no signs of cellulitis -Since last admission;  Since AFB x 2 negative, quantiferon indeterminate, blood cultures negative, sputum  LE EDEMA AND RASH Lower extremity IMA very possibly secondary to hypoalbuminemia. -We will holding Lasix her home dose of Lasix at this time. -Consider repeating albumin   LUNG MASS Patient had a masslike consolidation with bilateral pleural effusions with bronchoscopy negative for malignancy.  Patient's son thinks that she is  improving. Chest x-ray today shows decreased effusions and possible resolution of mass. Have not ordered a chest CT today given poor renal function however a contrasted CT might be beneficial once her kidney function has improved.  PARAVERTEBRAL FLUID COLLECTION Patient has no back pain or focal symptoms other than generalized weakness. We will continue to monitor, consider reimaging if become symptomatic.    DVT prophylaxis: Lovenox SQ Code Status:   Code Status: Full Code Family Communication: No family member at bedside, on admission patient's son was informed of current findings Disposition Plan:   Anticipated 1-2 days Admission status: Inpatient Consultants: None    Procedures:   No admission procedures for hospital encounter.   Renal ultrasound pending  Antimicrobials:  Anti-infectives (From admission, onward)   Start     Dose/Rate Route Frequency Ordered Stop   07/03/18 1730  fluconazole (DIFLUCAN) tablet 200 mg     200 mg Oral  Once 07/03/18 1718 07/03/18 2308       Medication:   enoxaparin (LOVENOX) injection  30 mg Subcutaneous Q24H   glipiZIDE  5 mg Oral QAC breakfast   insulin aspart  0-9 Units Subcutaneous TID WC    acetaminophen **OR** acetaminophen, bisacodyl, methocarbamol, phenol, polyethylene glycol     Objective:   Vitals:   07/03/18 1855 07/03/18 2047 07/04/18 0518 07/04/18 0933  BP:  (!) 86/73 (!) 109/57 (!) 108/57  Pulse:  (!) 110 91 88  Resp:  18 19 18   Temp:  99.2 F (37.3 C) 99.1 F (37.3 C) 98.3 F (36.8 C)  TempSrc:  Oral Oral Oral  SpO2:  91% 92% 95%  Weight: 87 kg 88.1 kg    Height: 5\' 7"  (1.702 m)       Intake/Output Summary (Last 24 hours) at 07/04/2018 1455 Last data filed at 07/04/2018 0900 Gross per 24 hour  Intake 1513.11 ml  Output 1230 ml  Net 283.11 ml   Filed Weights   07/03/18 1238 07/03/18 1855 07/03/18 2047  Weight: 93 kg 87 kg 88.1 kg     Examination:   Physical Exam  Constitution:  Alert,  cooperative, no distress,  Appears calm and comfortable  Psychiatric: Normal and stable mood and affect, cognition intact,   HEENT: Normocephalic, PERRL, otherwise with in Normal limits  Chest:Chest symmetric Cardio vascular:  S1/S2, RRR, No murmure, No Rubs or Gallops  pulmonary: Clear to auscultation bilaterally, respirations unlabored, negative wheezes / crackles Abdomen: Soft, non-tender, non-distended, bowel sounds,no masses, no organomegaly Muscular skeletal: Limited exam - in bed, able to move all 4 extremities, Normal strength,  Neuro: CNII-XII intact. , normal motor and sensation, reflexes intact  Extremities: No pitting edema lower extremities, +2 pulses  Skin: Dry, warm to touch, negative for any Rashes, No open wounds Wounds: per nursing documentation  LABs:  CBC Latest Ref Rng & Units 07/04/2018 07/03/2018 06/25/2018  WBC 4.0 - 10.5 K/uL 17.5(H) 17.0(H) 17.3(H)  Hemoglobin 12.0 - 15.0 g/dL 9.1(L) 9.5(L) 10.8(L)  Hematocrit 36.0 - 46.0 % 28.5(L) 30.1(L) 32.4(L)  Platelets 150 -  400 K/uL 442(H) 474(H) 695(H)   CMP Latest Ref Rng & Units 07/04/2018 07/03/2018 06/25/2018  Glucose 70 - 99 mg/dL 168(H) 135(H) 222(H)  BUN 8 - 23 mg/dL 57(H) 56(H) 33(H)  Creatinine 0.44 - 1.00 mg/dL 3.91(H) 3.63(H) 1.18(H)  Sodium 135 - 145 mmol/L 140 136 135  Potassium 3.5 - 5.1 mmol/L 4.2 4.5 3.5  Chloride 98 - 111 mmol/L 105 98 85(L)  CO2 22 - 32 mmol/L 24 27 37(H)  Calcium 8.9 - 10.3 mg/dL 7.9(L) 8.2(L) 8.1(L)  Total Protein 6.5 - 8.1 g/dL 6.5 7.5 6.9  Total Bilirubin 0.3 - 1.2 mg/dL 0.7 0.6 0.8  Alkaline Phos 38 - 126 U/L 119 120 175(H)  AST 15 - 41 U/L 26 26 35  ALT 0 - 44 U/L 21 24 53(H)        SIGNED: Deatra James, MD, FACP, FHM. [facility],  Pager [phone]  If 7PM-7AM, please contact night-coverage [email or web address] Ota [facility] 07/04/2018, 2:55 PM

## 2018-07-05 ENCOUNTER — Encounter (HOSPITAL_COMMUNITY): Payer: Self-pay | Admitting: Nephrology

## 2018-07-05 ENCOUNTER — Inpatient Hospital Stay (HOSPITAL_COMMUNITY): Payer: Medicare HMO

## 2018-07-05 LAB — C-REACTIVE PROTEIN: CRP: 24 mg/dL — ABNORMAL HIGH (ref <1.0)

## 2018-07-05 LAB — CBC
HCT: 28 % — ABNORMAL LOW (ref 36.0–46.0)
Hemoglobin: 8.8 g/dL — ABNORMAL LOW (ref 12.0–15.0)
MCH: 27.5 pg (ref 26.0–34.0)
MCHC: 31.4 g/dL (ref 30.0–36.0)
MCV: 87.5 fL (ref 80.0–100.0)
Platelets: 482 10*3/uL — ABNORMAL HIGH (ref 150–400)
RBC: 3.2 MIL/uL — ABNORMAL LOW (ref 3.87–5.11)
RDW: 14.6 % (ref 11.5–15.5)
WBC: 16 10*3/uL — ABNORMAL HIGH (ref 4.0–10.5)
nRBC: 0 % (ref 0.0–0.2)

## 2018-07-05 LAB — GLUCOSE, CAPILLARY
Glucose-Capillary: 180 mg/dL — ABNORMAL HIGH (ref 70–99)
Glucose-Capillary: 198 mg/dL — ABNORMAL HIGH (ref 70–99)
Glucose-Capillary: 126 mg/dL — ABNORMAL HIGH (ref 70–99)
Glucose-Capillary: 205 mg/dL — ABNORMAL HIGH (ref 70–99)

## 2018-07-05 LAB — PROTIME-INR
INR: 1.2 (ref 0.8–1.2)
Prothrombin Time: 15.1 seconds (ref 11.4–15.2)

## 2018-07-05 LAB — BASIC METABOLIC PANEL
Anion gap: 13 (ref 5–15)
BUN: 66 mg/dL — ABNORMAL HIGH (ref 8–23)
CO2: 21 mmol/L — ABNORMAL LOW (ref 22–32)
Calcium: 8.1 mg/dL — ABNORMAL LOW (ref 8.9–10.3)
Chloride: 103 mmol/L (ref 98–111)
Creatinine, Ser: 4.27 mg/dL — ABNORMAL HIGH (ref 0.44–1.00)
GFR calc Af Amer: 12 mL/min — ABNORMAL LOW (ref >60)
GFR calc non Af Amer: 10 mL/min — ABNORMAL LOW (ref >60)
Glucose, Bld: 205 mg/dL — ABNORMAL HIGH (ref 70–99)
Potassium: 4.2 mmol/L (ref 3.5–5.1)
Sodium: 137 mmol/L (ref 135–145)

## 2018-07-05 LAB — HIV ANTIBODY (ROUTINE TESTING W REFLEX): HIV Screen 4th Generation wRfx: NONREACTIVE

## 2018-07-05 LAB — SEDIMENTATION RATE: Sed Rate: 131 mm/hr — ABNORMAL HIGH (ref 0–22)

## 2018-07-05 LAB — IRON AND TIBC
Iron: 14 ug/dL — ABNORMAL LOW (ref 28–170)
Saturation Ratios: 9 % — ABNORMAL LOW (ref 10.4–31.8)
TIBC: 155 ug/dL — ABNORMAL LOW (ref 250–450)
UIBC: 141 ug/dL

## 2018-07-05 MED ORDER — ENOXAPARIN SODIUM 30 MG/0.3ML ~~LOC~~ SOLN
30.0000 mg | SUBCUTANEOUS | Status: DC
  Administered 2018-07-07 – 2018-07-09 (×3): 30 mg via SUBCUTANEOUS
  Filled 2018-07-05 (×3): qty 0.3

## 2018-07-05 MED ORDER — ALBUMIN HUMAN 25 % IV SOLN
12.5000 g | Freq: Four times a day (QID) | INTRAVENOUS | Status: AC
  Administered 2018-07-05 (×2): 12.5 g via INTRAVENOUS
  Filled 2018-07-05 (×2): qty 50

## 2018-07-05 NOTE — Progress Notes (Signed)
[facility] Progress Note  Subjective: seen in room, denies any SOB , cough.   Vitals:   07/04/18 1609 07/04/18 2121 07/05/18 0534 07/05/18 0916  BP: 112/71 (!) 113/58 122/66 116/64  Pulse: 85 91 73 76  Resp: 18 20 20 18   Temp: 98.6 F (37 C) 100 F (37.8 C) 98.4 F (36.9 C) 98.2 F (36.8 C)  TempSrc: Oral Oral Oral Oral  SpO2: 96% 92% 93% 95%  Weight:  88.2 kg    Height:        Inpatient medications: . [START ON 07/07/2018] enoxaparin (LOVENOX) injection  30 mg Subcutaneous Q24H  . glipiZIDE  5 mg Oral QAC breakfast  . insulin aspart  0-9 Units Subcutaneous TID WC  . living well with diabetes book- in spanish   Does not apply Once   . albumin human    . methylPREDNISolone (SOLU-MEDROL) injection 500 mg (07/05/18 0818)   acetaminophen **OR** acetaminophen, bisacodyl, methocarbamol, phenol, polyethylene glycol    Exam: Gen alert, not in distress, nasal O2, up in chair No jvd, no LAN Chest rales R base, L clear RRR no MRG Abd soft ntnd no mass or ascites +bs obese Ext 1-2+ pretib and pedal edema Neuro is alert, Ox 3 , nf, no asterixis    Home meds:  - furosemide 40 bid/ Kdur 20 bid  - cipro 250 qd  - prn's/ vitamins/ supplements   UA 6/12 > 100 prot, >50 rbc, 0-5 wbc  Urine prot 125, U creat 3.54, urine prot: creat ratio = 3.54  Renal US > IMPRESSION: Borderline increased renal parenchymal echogenicity on the left suggesting medical renal disease. The left kidney is larger than the right. 13.1 cm L, 12.9 cm R.  No renal masses, stones or hydronephrosis. ECHO 06/16/18 > LVEF 60-65 %  EKG 6/12 > normal EKG, NSR  CXR 6/12 > IMPRESSION: Small effusions with bilateral lung base opacities, which are most likely due to atelectasis. Consider pneumonia if there are consistent clinical findings. Prominent bronchovascular markings accentuated by the low lung volumes, without overt pulmonary edema.  Urine sediment 6/13 > rbc casts, degen cellular casts,  gran casts    Assessment/ Plan: 1. Renal failure - acute , renal US wnl,  UA +microhematuria and + proteinuria. No medical hx but was admitted here 2 wks ago w/ hemoptysis and lung infiltrates, bronch biopsy was negative, treated short-course IV abx then dc'd home, renal function was normal. Presenting now 2 wks later w/ acute renal failure, creat 3's.  UPC ratio +3gm last admit. Concern for vasculitis/ GN.  Urine sediment +RBC casts and degenerating casts. Started IV bolus steroids, IR consulted for biopsy, planned for 6/15. No indication for RRT yet. Explained to patient may need HD soon if kidneys not recover soon. Explained the situation to the daughter over the phone as well.  Will follow.  2. Vol excess - +LE edema on exam, nasal O2, dc'd IVF's. Low threshold for IV lasix.  3. Hypoalbuminemia - 3rd spacing from inflammatory state and/or proteinuria.  No hx liver disease.     [facility] 07/05/2018, 1:40 PM  Iron/TIBC/Ferritin/ %Sat No results found for: IRON, TIBC, FERRITIN, IRONPCTSAT Recent Labs  Lab 07/04/18 1345 07/05/18 0709  NA 140 137  K 4.2 4.2  CL 105 103  CO2 24 21*  GLUCOSE 168* 205*  BUN 57* 66*  CREATININE 3.91* 4.27*  CALCIUM 7.9* 8.1*  ALBUMIN 1.8*  --    Recent Labs  Lab 07/04/18  1345  AST 26  ALT 21  ALKPHOS 119  BILITOT 0.7  PROT 6.5   Recent Labs  Lab 07/05/18 0709  WBC 16.0*  HGB 8.8*  HCT 28.0*  PLT 482*

## 2018-07-05 NOTE — Evaluation (Signed)
Physical Therapy Evaluation Patient Details Name: Julia Johns MRN: [id number] DOB: Nov 20, 1950 Today's Date: 07/05/2018   History of Present Illness  68 y.o. female who was recently discharged from [facility] 06/26/2018 for work-up of shortness of breath and hemoptysis.  Extensive work-up in house revealed 4 x 3 cm LLL mass-like consolidation as well as paraspinal fluid collection.  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  Patient underwent a bronchoscopy on 5/28 and repeat bronchoscopy on 6/4 with inconclusive pathology findings but no clear evidence of malignancy. Patient was seen for routine posthospitalization follow-up at [facility] 07/03/18.  At that time she complained of dysuria.  UA showed hematuria and patient was started on ciprofloxacin.  Patient was called by [facility] 07/03/18 telling her to come to the ED because she was in acute renal failure.    Clinical Impression  Pt admitted with above diagnosis. Pt currently with functional limitations due to the deficits listed below (see PT Problem List). On eval, pt required supervision bed mobility, min guard assist transfers and min guard assist ambulation 10 feet with RW. Pt on RA with SpO2 >90%.  Pt will benefit from skilled PT to increase their independence and safety with mobility to allow discharge to the venue listed below.       Follow Up Recommendations Home health PT    Equipment Recommendations  None recommended by PT    Recommendations for Other Services       Precautions / Restrictions Precautions Precautions: Fall      Mobility  Bed Mobility Overal bed mobility: Needs Assistance Bed Mobility: Supine to Sit     Supine to sit: Supervision;HOB elevated     General bed mobility comments: +rail, supervision for safety  Transfers Overall transfer level: Needs  assistance Equipment used: Ambulation equipment used Transfers: Sit to/from Stand;Stand Pivot Transfers Sit to Stand: Min guard Stand pivot transfers: Min guard       General transfer comment: increased time to stabilize initial standing balance  Ambulation/Gait Ambulation/Gait assistance: Min guard Gait Distance (Feet): 10 Feet Assistive device: Rolling walker (2 wheeled) Gait Pattern/deviations: Step-through pattern;Decreased stride length Gait velocity: decreased   General Gait Details: steady gait with RW  Stairs            Wheelchair Mobility    Modified Rankin (Stroke Patients Only)       Balance Overall balance assessment: Needs assistance Sitting-balance support: No upper extremity supported;Feet supported Sitting balance-Leahy Scale: Good     Standing balance support: Bilateral upper extremity supported;During functional activity Standing balance-Leahy Scale: Poor Standing balance comment: reliant on UE support                             Pertinent Vitals/Pain Pain Assessment: No/denies pain    Home Living Family/patient expects to be discharged to:: Private residence Living Arrangements: Children Available Help at Discharge: Family;Available 24 hours/day Type of Home: Apartment Home Access: Level entry     Home Layout: One level Home Equipment: Walker - 2 wheels;Other (comment) Additional Comments: home O2    Prior Function Level of Independence: Independent         Comments: independent prior to initial hospitalization     Hand Dominance        Extremity/Trunk Assessment   Upper Extremity Assessment Upper  Extremity Assessment: Generalized weakness    Lower Extremity Assessment Lower Extremity Assessment: Generalized weakness       Communication   Communication: Prefers language other than English  Cognition Arousal/Alertness: Awake/alert Behavior During Therapy: WFL for tasks assessed/performed Overall  Cognitive Status: Within Functional Limits for tasks assessed                                        General Comments      Exercises     Assessment/Plan    PT Assessment Patient needs continued PT services  PT Problem List Decreased strength;Decreased mobility;Decreased activity tolerance;Cardiopulmonary status limiting activity       PT Treatment Interventions Gait training;Functional mobility training;Therapeutic activities;Therapeutic exercise;Balance training    PT Goals (Current goals can be found in the Care Plan section)  Acute Rehab PT Goals Patient Stated Goal: not stated PT Goal Formulation: With patient Time For Goal Achievement: 07/19/18 Potential to Achieve Goals: Good    Frequency Min 3X/week   Barriers to discharge        Co-evaluation               AM-PAC PT "6 Clicks" Mobility  Outcome Measure Help needed turning from your back to your side while in a flat bed without using bedrails?: None Help needed moving from lying on your back to sitting on the side of a flat bed without using bedrails?: None Help needed moving to and from a bed to a chair (including a wheelchair)?: A Little Help needed standing up from a chair using your arms (e.g., wheelchair or bedside chair)?: A Little Help needed to walk in hospital room?: A Little Help needed climbing 3-5 steps with a railing? : A Lot 6 Click Score: 19    End of Session Equipment Utilized During Treatment: Gait belt Activity Tolerance: Patient tolerated treatment well Patient left: in chair;with call bell/phone within reach Nurse Communication: Mobility status PT Visit Diagnosis: Muscle weakness (generalized) (M62.81);Unsteadiness on feet (R26.81)    Time: 7711-6579 PT Time Calculation (min) (ACUTE ONLY): 20 min   Charges:   PT Evaluation $PT Eval Moderate Complexity: 1 Mod          Lorrin Goodell, PT  Office # [phone] Pager [phone]   Lorriane Shire 07/05/2018,  3:30 PM

## 2018-07-05 NOTE — Progress Notes (Signed)
Called patient's daughter [name] to update on patient's status. If needed [name] stated [name], patient's son can be called for updates and help with interpretation too.

## 2018-07-05 NOTE — Consult Note (Addendum)
Chief Complaint: Patient was seen in consultation today for ultrasound-guided random renal biopsy Chief Complaint  Patient presents with   Dysuria   Leg Swelling   sent by doctor    Referring Physician(s): Schertz,R  Supervising Physician: Markus Daft  Patient Status: [facility] - In-pt  History of Present Illness: Julia Johns is a 68 y.o. female with no sig PMH admitted here 5/19 - 06/26/18 for SOB w/ blood-tinged sputum and chest pain.  CT chest showed no PE, but showed LLL density 3x5 cm mass w/ surrounding infiltrate, and also some prevertebral densities malig vs infection. She was treated w/ IV abx for PNA, underwent bronch on 5/28 and prelim reports suggested malignancy so IV abx were stopped. Pt was dc'd home to f/u w/ pulm in 2 wks for results of pathology. She had bilat prox musc weakness also, LE edema felt to be acute diast CHF and got IV lasix. CT abd for abd pain showed sigmoid colitis w/ o abscess or perforation.     Pt now admitted a couple weeks later and after presenting for dysuria, bilat LE edeam and PCP labs that showed new renal failure. Pt admitted, creat is up to 3.91, it was 0.91 on 6/3 and 1.18 on 6/4.    Latest WBC 16, hemoglobin 8.8, platelets 482k, creat 4.27, PT/INR pend; request now received from nephrology for image guided random renal biopsy.  She is COVID-19 negative.    History reviewed. No pertinent past medical history.  Past Surgical History:  Procedure Laterality Date   VIDEO BRONCHOSCOPY Bilateral 06/18/2018   Procedure: VIDEO BRONCHOSCOPY WITH FLUORO;  Surgeon: Rigoberto Noel, MD;  Location: [place];  Service: Cardiopulmonary;  Laterality: Bilateral;   VIDEO BRONCHOSCOPY WITH ENDOBRONCHIAL ULTRASOUND N/A 06/25/2018   Procedure: VIDEO BRONCHOSCOPY WITH ENDOBRONCHIAL ULTRASOUND;  Surgeon: Rigoberto Noel, MD;  Location: [facility];  Service: Thoracic;  Laterality: N/A;    Allergies: Penicillins  Medications: Prior to Admission  medications   Medication Sig Start Date End Date Taking? Authorizing Provider  ciprofloxacin (CIPRO) 250 MG tablet Take 250 mg by mouth daily at 12 noon.   Yes [provider]  furosemide (LASIX) 40 MG tablet Take 1 tablet (40 mg total) by mouth 2 (two) times daily. 06/26/18 06/26/19 Yes Georgette Shell, MD  methocarbamol (ROBAXIN) 500 MG tablet Take 1 tablet (500 mg total) by mouth every 8 (eight) hours as needed for muscle spasms. 06/26/18  Yes Georgette Shell, MD  phenol (CHLORASEPTIC) 1.4 % LIQD Use as directed 1 spray in the mouth or throat as needed for throat irritation / pain.   Yes [provider]  potassium chloride (K-DUR) 10 MEQ tablet Take 2 tablets (20 mEq total) by mouth 2 (two) times daily. 06/26/18  Yes Georgette Shell, MD  bisacodyl (DULCOLAX) 10 MG suppository Place 1 suppository (10 mg total) rectally daily as needed for moderate constipation. 06/26/18   Georgette Shell, MD  senna-docusate (SENOKOT-S) 8.6-50 MG tablet Take 1 tablet by mouth 2 (two) times daily. 06/26/18   Georgette Shell, MD     No family history on file.  Social History   Socioeconomic History   Marital status: Single    Spouse name: Not on file   Number of children: Not on file   Years of education: Not on file   Highest education level: Not on file  Occupational History   Not on file  Social Needs   Financial resource strain: Not on file  Food insecurity    Worry: Not on file    Inability: Not on file   Transportation needs    Medical: Not on file    Non-medical: Not on file  Tobacco Use   Smoking status: Never Smoker   Smokeless tobacco: Never Used  Substance and Sexual Activity   Alcohol use: Never    Frequency: Never   Drug use: Never   Sexual activity: Not on file  Lifestyle   Physical activity    Days per week: Not on file    Minutes per session: Not on file   Stress: Not on file  Relationships   Social connections    Talks on  phone: Not on file    Gets together: Not on file    Attends religious service: Not on file    Active member of club or organization: Not on file    Attends meetings of clubs or organizations: Not on file    Relationship status: Not on file  Other Topics Concern   Not on file  Social History Narrative   Not on file      Review of Systems currently denies fever, headache, worsening dyspnea, chest pain, nausea, vomiting; does have some occasional burning sensations in abdomen.  Vital Signs: BP 116/64 (BP Location: Right Arm)    Pulse 76    Temp 98.2 F (36.8 C) (Oral)    Resp 18    Ht 5\' 7"  (1.702 m)    Wt 194 lb 7.1 oz (88.2 kg)    SpO2 95%    BMI 30.45 kg/m   Physical Exam awake, alert.  Chest with diminished breath sounds bases.  Heart with regular rate and rhythm.  Abdomen soft, positive bowel sounds, currently nontender.  Extremities with 2+ pretibial edema bilaterally. Lateral forefoot ulcers bilat  Imaging: Dg Chest 2 View  Result Date: 07/05/2018 CLINICAL DATA:  Follow-up. EXAM: CHEST - 2 VIEW COMPARISON:  07/03/2018 FINDINGS: Lung volumes remain low. The cardiomediastinal silhouette is unchanged. There is peribronchial cuffing with increased patchy opacity in the perihilar regions bilaterally. Left greater than right basilar airspace opacities have not significantly changed with masslike left basilar density on prior CT. Small pleural effusions are suspected. No pneumothorax is identified. IMPRESSION: Low lung volumes with mild worsening of bilateral lung opacities which may reflect edema or pneumonia. Electronically Signed   By: Logan Bores M.D.   On: 07/05/2018 08:07   Ct Angio Chest Pe W Or Wo Contrast  Result Date: 06/16/2018 CLINICAL DATA:  Shortness of breath EXAM: CT ANGIOGRAPHY CHEST WITH CONTRAST TECHNIQUE: Multidetector CT imaging of the chest was performed using the standard protocol during bolus administration of intravenous contrast. Multiplanar CT image  reconstructions and MIPs were obtained to evaluate the vascular anatomy. CONTRAST:  33mL OMNIPAQUE IOHEXOL 350 MG/ML SOLN COMPARISON:  Jun 09, 2018 CT angiogram chest; chest CT in thoracic MRI Jun 05, 2018 FINDINGS: Cardiovascular: There is no demonstrable pulmonary embolus. There is no thoracic aortic aneurysm or dissection. Visualized great vessels appear normal. No pericardial effusion or pericardial thickening. Mediastinum/Nodes: There remains diffuse enlargement of the left lobe of the thyroid causing deviation of the trachea in the upper thoracic region toward the right. There is a mildly enhancing mass in the left lobe of the thyroid measuring 2.8 x 2.7 cm. Multiple subcentimeter lymph nodes are evident. No frank thoracic adenopathy evident. The prevertebral soft tissue thickening extending from T6-T10 is better seen on thoracic MR and prior CT with bolus timing not  targeted for arterial phase imaging. No new soft tissue lesion evident. No esophageal lesions evident. Lungs/Pleura: There are there are fairly small pleural effusions bilaterally. There is airspace consolidation in both lower lobes. There is a masslike area in the left lower lobe posteriorly measuring 4.8 x 4.3 cm which is increased in size compared to 11 days prior and likely represents either progression of pneumonia or consolidation surrounding a mas areas of consolidation in both upper lobes and lingular regions have increased. Upper abdomen: There is mild reflux of contrast into the inferior vena cava and hepatic veins. There is hepatic steatosis. Visualized upper abdominal structures otherwise appear unremarkable. Musculoskeletal: There are no blastic or lytic bone lesions. No chest wall lesions evident. IMPRESSION: 1. No demonstrable pulmonary embolus. No thoracic aortic aneurysm or dissection. 2. Fairly small right pleural effusions, slightly larger than on recent studies. Multifocal airspace consolidation, progressed. Suspect  consolidation surrounding mass in left lower lobe posteriorly. 3.  Subcentimeter lymph nodes without frank adenopathy. 4. Enlarged left lobe of thyroid with dominant thyroid mass measuring 2.8 x 2.7 cm in the left lobe. It may be prudent to correlate with ultrasound given dominant thyroid mass on the left. 5. The paraspinous lesion from T6-T10 noted on recent studies is better seen on recent thoracic MR and chest CT not bolus timed to optimize arterial vascular visualization. Neoplastic etiology in this paraspinous lesion must be of concern as noted recent thoracic MR. 6. Mild reflux of contrast into the inferior vena cava and hepatic veins may indicate a degree of increase in right heart pressure. 7.  Hepatic steatosis. Electronically Signed   By: Lowella Grip III M.D.   On: 06/16/2018 17:16   Ct Abdomen Pelvis W Contrast  Result Date: 06/14/2018 CLINICAL DATA:  Abdominal distention, nausea, vomiting, pain, colitis, paravertebral phlegmon EXAM: CT ABDOMEN AND PELVIS WITH CONTRAST TECHNIQUE: Multidetector CT imaging of the abdomen and pelvis was performed using the standard protocol following bolus administration of intravenous contrast. CONTRAST:  181mL OMNIPAQUE IOHEXOL 300 MG/ML SOLN, additional oral enteric contrast COMPARISON:  CT abdomen pelvis, 06/03/2018, CT chest, 06/05/2018, MR thoracic spine, 06/05/2018 FINDINGS: Lower chest: There are bilateral pleural effusions with associated atelectasis or consolidation and a large, masslike consolidation of the dependent left lung base measuring at least 4.9 cm, which appears to be enlarged compared to prior CT of the chest dated 06/09/2018 (series 3, image 8). There is paravertebral fluid seen about the partially included anterior lower thoracic spine and diaphragmatic cruces (series 3, image 9). Hepatobiliary: No focal liver abnormality is seen. Hepatic steatosis. No gallstones, gallbladder wall thickening, or biliary dilatation. Pancreas: Unremarkable. No  pancreatic ductal dilatation or surrounding inflammatory changes. Spleen: Normal in size without focal abnormality. Adrenals/Urinary Tract: Adrenal glands are unremarkable. Kidneys are normal, without renal calculi, focal lesion, or hydronephrosis. Bladder is unremarkable. Stomach/Bowel: Stomach is within normal limits. Appendix appears normal. Sigmoid diverticulosis. There is mild persistent fat stranding adjacent to the proximal sigmoid colon (series 3, image 60). Vascular/Lymphatic: No significant vascular findings are present. No enlarged abdominal or pelvic lymph nodes. Reproductive: Uterine fibroids. Other: No abdominal wall hernia or abnormality. No abdominopelvic ascites. Musculoskeletal: No acute or significant osseous findings. IMPRESSION: 1. Sigmoid diverticulosis. There is mild persistent fat stranding adjacent to the proximal sigmoid colon (series 3, image 60). This may reflect persistent diverticulitis/colitis or alternately chronic sequelae. No evidence of worsening colitis or developing complication such as abscess or perforation. 2. No other CT findings of the abdomen or pelvis to explain  abdominal distension, nausea, vomiting, or pain. No evidence of bowel obstruction. 3. There are bilateral pleural effusions with associated atelectasis or consolidation and a large, masslike consolidation of the dependent left lung base measuring at least 4.9 cm, which appears to be enlarged compared to prior CT of the chest dated 06/09/2018 (series 3, image 8). 4. There is paravertebral fluid seen about the partially included anterior lower thoracic spine and diaphragmatic cruces (series 3, image 9). Consider dedicated chest imaging to further evaluate interval change in these findings. Electronically Signed   By: Eddie Candle M.D.   On: 06/14/2018 12:01   US Renal  Result Date: 07/03/2018 CLINICAL DATA:  Acute renal failure. EXAM: RENAL / URINARY TRACT ULTRASOUND COMPLETE COMPARISON:  None. FINDINGS: Right  Kidney: Renal measurements: 13.1 x 5.5 x 4.6 cm = volume: 172 mL . Echogenicity within normal limits. No mass or hydronephrosis visualized. Left Kidney: Renal measurements: 12.9 x 7.8 x 6.0 cm = volume: 316 mL. Borderline increased parenchymal echogenicity. No mass or stone. No hydronephrosis Bladder: Appears normal for degree of bladder distention. IMPRESSION: 1. Borderline increased renal parenchymal echogenicity on the left suggesting medical renal disease. The left kidney is larger than the right. No renal masses, stones or hydronephrosis. Electronically Signed   By: Lajean Manes M.D.   On: 07/03/2018 17:52   Dg Chest Portable 1 View  Result Date: 07/03/2018 CLINICAL DATA:  Pts family states pt was discharged 1 wk ago (admitted for stomach mass and PNA). States the pt has been having dysuria x 2-3 days, also having bilateral leg swelling. Pt was told by doctor that labs yesterday reflected new onset DM and acute renal failure - sent by doc to ED for eval EXAM: PORTABLE CHEST 1 VIEW COMPARISON:  Chest CT, 06/16/2018. Prior chest radiographs, most recent dated 06/05/2018. FINDINGS: Lung volumes are low. There is opacity at both lung bases consistent with a combination of small pleural effusions and atelectasis. Pneumonia at either lung base is possible. There are prominent bronchovascular markings bilaterally, but no overt pulmonary edema. Cardiac silhouette is normal in size. No mediastinal or hilar masses. Skeletal structures are grossly intact. IMPRESSION: 1. Small effusions with bilateral lung base opacities, which are most likely due to atelectasis. Consider pneumonia if there are consistent clinical findings. Prominent bronchovascular markings accentuated by the low lung volumes, without overt pulmonary edema. Electronically Signed   By: Lajean Manes M.D.   On: 07/03/2018 15:00   Dg Abd Portable 1v  Result Date: 06/11/2018 CLINICAL DATA:  Abdominal pain and distension. EXAM: PORTABLE ABDOMEN - 1  VIEW COMPARISON:  CT abdomen and pelvis 06/03/2018 FINDINGS: Gas is present in scattered nondilated loops of bowel without evidence of obstruction. No gross intraperitoneal free air is identified on this supine study. Thoracolumbar spondylosis is noted. The lung bases are not well evaluated due to motion artifact. IMPRESSION: Nonobstructed bowel gas pattern. Electronically Signed   By: Logan Bores M.D.   On: 06/11/2018 11:38   Dg Swallowing Func-speech Pathology  Result Date: 06/24/2018 Objective Swallowing Evaluation: Type of Study: Bedside Swallow Evaluation  Patient Details Name: Julia Johns MRN: [id number] Date of Birth: [birth year] Today's Date: 06/24/2018 Time: SLP Start Time (ACUTE ONLY): 3976 -SLP Stop Time (ACUTE ONLY): 1410 SLP Time Calculation (min) (ACUTE ONLY): 25 min Past Medical History: No past medical history on file. Past Surgical History: Past Surgical History: Procedure Laterality Date  VIDEO BRONCHOSCOPY Bilateral 06/18/2018  Procedure: VIDEO BRONCHOSCOPY WITH FLUORO;  Surgeon: Rigoberto Noel, MD;  Location: [facility];  Service: Cardiopulmonary;  Laterality: Bilateral; HPI: This is a 68 year old female who presented as a transfer from [facility] with complaints of shortness of breath and blood-tinged sputum.  Patient underwent CT imaging which revealed a paraspinal soft tissue thickening from T6-T10 as well as a left lower lobe density within the CT which was enlarged from prior imaging concerning for an inflammatory/infectious etiology. Bronchoscopy 5/28. No culture growth. Cytology with atypical cells. No malignancy identified. TB- AFBs neg x 2, Quantiferon TB indeterminate, Covid negative. MD ordered MBS for objective assessment of swallow function.  No data recorded Assessment / Plan / Recommendation CHL IP CLINICAL IMPRESSIONS 06/24/2018 Clinical Impression Pt demonstrates normal swallow function. She could not swallow a barium tablet, could not orally transit despite  liquid and puree boluses given, said it was too large. No SLP f/u needed. Continue current diet.  SLP Visit Diagnosis Dysphagia, unspecified (R13.10) Attention and concentration deficit following -- Frontal lobe and executive function deficit following -- Impact on safety and function Mild aspiration risk   CHL IP TREATMENT RECOMMENDATION 06/24/2018 Treatment Recommendations No treatment recommended at this time   No flowsheet data found. CHL IP DIET RECOMMENDATION 06/24/2018 SLP Diet Recommendations Regular solids;Thin liquid Liquid Administration via Cup;Straw Medication Administration Whole meds with liquid Compensations -- Postural Changes --   No flowsheet data found.  No flowsheet data found.  No flowsheet data found.     CHL IP ORAL PHASE 06/24/2018 Oral Phase WFL Oral - Pudding Teaspoon -- Oral - Pudding Cup -- Oral - Julia Teaspoon -- Oral - Julia Cup -- Oral - Nectar Teaspoon -- Oral - Nectar Cup -- Oral - Nectar Straw -- Oral - Thin Teaspoon -- Oral - Thin Cup -- Oral - Thin Straw -- Oral - Puree -- Oral - Mech Soft -- Oral - Regular -- Oral - Multi-Consistency -- Oral - Pill -- Oral Phase - Comment --  CHL IP PHARYNGEAL PHASE 06/24/2018 Pharyngeal Phase WFL Pharyngeal- Pudding Teaspoon -- Pharyngeal -- Pharyngeal- Pudding Cup -- Pharyngeal -- Pharyngeal- Julia Teaspoon -- Pharyngeal -- Pharyngeal- Julia Cup -- Pharyngeal -- Pharyngeal- Nectar Teaspoon -- Pharyngeal -- Pharyngeal- Nectar Cup -- Pharyngeal -- Pharyngeal- Nectar Straw -- Pharyngeal -- Pharyngeal- Thin Teaspoon -- Pharyngeal -- Pharyngeal- Thin Cup -- Pharyngeal -- Pharyngeal- Thin Straw -- Pharyngeal -- Pharyngeal- Puree -- Pharyngeal -- Pharyngeal- Mechanical Soft -- Pharyngeal -- Pharyngeal- Regular -- Pharyngeal -- Pharyngeal- Multi-consistency -- Pharyngeal -- Pharyngeal- Pill -- Pharyngeal -- Pharyngeal Comment --  No flowsheet data found. Herbie Baltimore, MA CCC-SLP Acute Rehabilitation Services Pager [phone] Office [phone] Lynann Beaver 06/24/2018, 2:48 PM              Dg C-arm 1-60 Min-no Report  Result Date: 06/18/2018 Fluoroscopy was utilized by the requesting physician.  No radiographic interpretation.   Vas [place] Lower Extremity Venous (dvt)  Result Date: 06/17/2018  Lower Venous Study Indications: Edema.  Performing Technologist: Abram Sander RVS  Examination Guidelines: A complete evaluation includes B-mode imaging, spectral Doppler, color Doppler, and power Doppler as needed of all accessible portions of each vessel. Bilateral testing is considered an integral part of a complete examination. Limited examinations for reoccurring indications may be performed as noted.  +---------+---------------+---------+-----------+----------+--------------+  RIGHT     Compressibility Phasicity Spontaneity Properties Summary         +---------+---------------+---------+-----------+----------+--------------+  CFV       Full            Yes  Yes                                    +---------+---------------+---------+-----------+----------+--------------+  SFJ       Full                                                             +---------+---------------+---------+-----------+----------+--------------+  FV Prox   Full                                                             +---------+---------------+---------+-----------+----------+--------------+  FV Mid    Full                                                             +---------+---------------+---------+-----------+----------+--------------+  FV Distal Full                                                             +---------+---------------+---------+-----------+----------+--------------+  PFV       Full                                                             +---------+---------------+---------+-----------+----------+--------------+  POP       Full            Yes       Yes                                     +---------+---------------+---------+-----------+----------+--------------+  PTV       Full                                                             +---------+---------------+---------+-----------+----------+--------------+  PERO                                                       Not visualized  +---------+---------------+---------+-----------+----------+--------------+   +---------+---------------+---------+-----------+----------+--------------+  LEFT      Compressibility Phasicity Spontaneity Properties Summary         +---------+---------------+---------+-----------+----------+--------------+  CFV  Full            Yes       Yes                                    +---------+---------------+---------+-----------+----------+--------------+  SFJ       Full                                                             +---------+---------------+---------+-----------+----------+--------------+  FV Prox   Full                                                             +---------+---------------+---------+-----------+----------+--------------+  FV Mid    Full                                                             +---------+---------------+---------+-----------+----------+--------------+  FV Distal Full                                                             +---------+---------------+---------+-----------+----------+--------------+  PFV       Full                                                             +---------+---------------+---------+-----------+----------+--------------+  POP       Full            Yes       Yes                                    +---------+---------------+---------+-----------+----------+--------------+  PTV       Full                                                             +---------+---------------+---------+-----------+----------+--------------+  PERO                                                       Not visualized   +---------+---------------+---------+-----------+----------+--------------+     Summary: Right: There is no  evidence of deep vein thrombosis in the lower extremity. No cystic structure found in the popliteal fossa. Left: There is no evidence of deep vein thrombosis in the lower extremity. No cystic structure found in the popliteal fossa.  *See table(s) above for measurements and observations. Electronically signed by Harold Barban MD on 06/17/2018 at 1:08:38 PM.    Final     Labs:  CBC: Recent Labs    06/25/18 1441 07/03/18 1249 07/04/18 1345 07/05/18 0709  WBC 17.3* 17.0* 17.5* 16.0*  HGB 10.8* 9.5* 9.1* 8.8*  HCT 32.4* 30.1* 28.5* 28.0*  PLT 695* 474* 442* 482*    COAGS: No results for input(s): INR, APTT in the last 8760 hours.  BMP: Recent Labs    06/25/18 1441 07/03/18 1249 07/04/18 1345 07/05/18 0709  NA 135 136 140 137  K 3.5 4.5 4.2 4.2  CL 85* 98 105 103  CO2 37* 27 24 21*  GLUCOSE 222* 135* 168* 205*  BUN 33* 56* 57* 66*  CALCIUM 8.1* 8.2* 7.9* 8.1*  CREATININE 1.18* 3.63* 3.91* 4.27*  GFRNONAA 47* 12* 11* 10*  GFRAA 55* 14* 13* 12*    LIVER FUNCTION TESTS: Recent Labs    06/22/18 0648 06/25/18 1441 07/03/18 1249 07/04/18 1345  BILITOT 0.8 0.8 0.6 0.7  AST 101* 35 26 26  ALT 119* 53* 24 21  ALKPHOS 255* 175* 120 119  PROT 7.1 6.9 7.5 6.5  ALBUMIN 1.7* 1.7* 2.1* 1.8*    TUMOR MARKERS: No results for input(s): AFPTM, CEA, CA199, CHROMGRNA in the last 8760 hours.  Assessment and Plan: Patient with history of acute renal failure, microhematuria/proteinuria, lower extremity edema/hypoalbuminemia, new onset DM; request received for image guided random renal biopsy; r/o glomerulonephritis. Risks and benefits of procedure was discussed with the patient via interpreter ncluding, but not limited to bleeding, infection, damage to adjacent structures or low yield requiring additional tests.  All of the questions were answered and there is agreement to  proceed.  Consent signed and in chart.  Procedure tent planned for 6/15   Thank you for this interesting consult.  I greatly enjoyed meeting Nasiya Pascual and look forward to participating in their care.  A copy of this report was sent to the requesting provider on this date.  Electronically Signed: D. Rowe Robert, PA-C 07/05/2018, 11:21 AM   I spent a total of 30 minutes  in face to face in clinical consultation, greater than 50% of which was counseling/coordinating care for image guided random core renal biopsy

## 2018-07-05 NOTE — Progress Notes (Signed)
PROGRESS NOTE    Patient: Julia Johns                            PCP: Patient, No Pcp Per                    DOB: 04/20/1950            DOA: 07/03/2018 ZOX:[id number]             DOS: 07/05/2018, 11:07 AM   LOS: 2 days   Date of Service: The patient was seen and examined on 07/05/2018  Subjective:   The patient was seen and examined this morning, no acute distress sitting up in bed. Was translated through translator of her current finding including worsening kidney function. She is aware nephrology has seen her.   No complaints this morning reporting she is urinating well.   Brief Narrative:   Jakai Onofre is an 68 y.o. female who was recently discharged from [facility] 06/26/2018 for work-up of shortness of breath and hemoptysis.  Extensive work-up in house revealed 4 x 3 cm LLL mass-like consolidation as well as paraspinal fluid collection.  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   Patient underwent a bronchoscopy on 06/18/2018 and repeat bronchoscopy on 06/25/2018 with inconclusive pathology findings but no clear evidence of malignancy.  Patient was seen for routine posthospitalization follow-up at [facility] yesterday.  At that time she complained of dysuria.  UA showed hematuria and patient was started on ciprofloxacin.  Patient was called by [facility] today telling her to come to the ED because she was in Acute Renal Failure. Apparently patient has been now diagnosed with diabetes mellitus  ED Course:  The patient was noted to have worsened kidney function than even yesterday.  Creatinine had increased to 3.6 today up from 2.8 yesterday.  Her renal function was normal on discharge  Assessment & Plan:    Principal Problem:   Acute renal failure (ARF) (HCC) Active Problems:   Paravertebral mass   Mass of lower  lobe of left lung   Bilateral lower extremity edema   Hematuria   Yeast infection involving the vagina and surrounding area   Hypoalbuminemia   Dysuria    ACUTE RENAL FAILURE -Worsening renal function >>>  --Creatinine 3.6 >> 3.91 >> 4.27 -Nephritic syndrome, proteinuria, hematuria (less likely nephrotic) -May have been exacerbated by diuretics Lasix / And hypotension  -Nephrologist consulted --- appreciate input -Pending renal ultrasound -- Did not reveal any abnormalities including hydronephrosis.  Normal-appearing kidneys -Continue with the Foley catheter, monitoring strict I's and O's - net -105 -Nephrology has initiated empiric IV bolus steroids, -Consulted IR for renal biopsy.  -Avoiding nephrotoxins -Continue gentle fluid hydration -good Urine output     HYPOALBUMINEMIA With significant proteinuria and albumin of 2.1 along with lower extremity edema. She may have some level of nephrotic syndrome Protein creatinine ratio requested.  HEMATURIA -Mild Patient with hematuria on UA no casts seen on microscopy. She does have a vaginitis which may be contaminating the urine. As noted above renal ultrasound is ordered.   VAGINITIS -Currently asymptomatic Patient appears to have a candidal vaginitis very possibly secondary to her untreated diabetes. Will treat with fluconazole 200 mg p.o. x1 now  NEW ONSET DIABETES Patient with hemoglobin  A1c at 7.9 -Consulted for diabetic education, dietitian for diabetic diet -Checking CBG QAC/HS, with SSI -Started the patient on glipizide (over metformin due to AKI)    LEUKOCYTOSIS -No signs of overt infection -noted for no fever or chills, -Likely reactive, monitoring very closely -Withholding antibiotics -Anticipating worsening leukocytosis since patient started on a steroid -UA within normal limits, imaging clear, recent CT of abdomen pelvis reviewed -Mild erythema lower extremities no signs of cellulitis -Since last  admission;  Since AFB x 2 negative, quantiferon indeterminate, blood cultures negative, sputum  LE EDEMA AND RASH Lower extremity IMA very possibly secondary to hypoalbuminemia. -We will holding Lasix her home dose of Lasix at this time. -Consider repeating albumin   LUNG MASS Patient had a masslike consolidation with bilateral pleural effusions with bronchoscopy negative for malignancy.  Patient's son thinks that she is improving. Chest x-ray today shows decreased effusions and possible resolution of mass. Have not ordered a chest CT today given poor renal function however a contrasted CT might be beneficial once her kidney function has improved.  PARAVERTEBRAL FLUID COLLECTION Patient has no back pain or focal symptoms other than generalized weakness. We will continue to monitor, consider reimaging if become symptomatic.    DVT prophylaxis: Lovenox SQ Code Status:   Code Status: Full Code Family Communication: No family member at bedside, on admission patient's son was informed of current findings Disposition Plan:   Anticipated 1-2 days Admission status: Inpatient Consultants: None    Procedures:   No admission procedures for hospital encounter.   Renal ultrasound pending  Antimicrobials:  Anti-infectives (From admission, onward)   Start     Dose/Rate Route Frequency Ordered Stop   07/03/18 1730  fluconazole (DIFLUCAN) tablet 200 mg     200 mg Oral  Once 07/03/18 1718 07/03/18 2308       Medication:  . enoxaparin (LOVENOX) injection  30 mg Subcutaneous Q24H  . glipiZIDE  5 mg Oral QAC breakfast  . insulin aspart  0-9 Units Subcutaneous TID WC  . living well with diabetes book- in spanish   Does not apply Once    acetaminophen **OR** acetaminophen, bisacodyl, methocarbamol, phenol, polyethylene glycol     Objective:   Vitals:   07/04/18 1609 07/04/18 2121 07/05/18 0534 07/05/18 0916  BP: 112/71 (!) 113/58 122/66 116/64  Pulse: 85 91 73 76  Resp: 18  20 20 18   Temp: 98.6 F (37 C) 100 F (37.8 C) 98.4 F (36.9 C) 98.2 F (36.8 C)  TempSrc: Oral Oral Oral Oral  SpO2: 96% 92% 93% 95%  Weight:  88.2 kg    Height:        Intake/Output Summary (Last 24 hours) at 07/05/2018 1107 Last data filed at 07/05/2018 0916 Gross per 24 hour  Intake 534 ml  Output 1225 ml  Net -691 ml   Filed Weights   07/03/18 1855 07/03/18 2047 07/04/18 2121  Weight: 87 kg 88.1 kg 88.2 kg     Examination:   BP 116/64 (BP Location: Right Arm)   Pulse 76   Temp 98.2 F (36.8 C) (Oral)   Resp 18   Ht 5\' 7"  (1.702 m)   Wt 88.2 kg   SpO2 95%   BMI 30.45 kg/m    Physical Exam  Constitution:  Alert, cooperative, no distress,  Psychiatric: Normal and stable mood and affect, cognition intact,   HEENT: Normocephalic, PERRL, otherwise with in Normal limits  Chest:Chest symmetric Cardio vascular:  S1/S2, RRR, No murmure, No Rubs  or Gallops  pulmonary: Clear to auscultation bilaterally, respirations unlabored, negative wheezes / crackles Abdomen: Soft, non-tender, non-distended, bowel sounds,no masses, no organomegaly Muscular skeletal: Limited exam - in bed, able to move all 4 extremities, Normal strength,  Neuro: CNII-XII intact. , normal motor and sensation, reflexes intact  Extremities: No pitting edema lower extremities, +2 pulses  Skin: Dry, warm to touch, negative for any Rashes, No open wounds    LABs:  CBC Latest Ref Rng & Units 07/05/2018 07/04/2018 07/03/2018  WBC 4.0 - 10.5 K/uL 16.0(H) 17.5(H) 17.0(H)  Hemoglobin 12.0 - 15.0 g/dL 8.8(L) 9.1(L) 9.5(L)  Hematocrit 36.0 - 46.0 % 28.0(L) 28.5(L) 30.1(L)  Platelets 150 - 400 K/uL 482(H) 442(H) 474(H)   CMP Latest Ref Rng & Units 07/05/2018 07/04/2018 07/03/2018  Glucose 70 - 99 mg/dL 205(H) 168(H) 135(H)  BUN 8 - 23 mg/dL 66(H) 57(H) 56(H)  Creatinine 0.44 - 1.00 mg/dL 4.27(H) 3.91(H) 3.63(H)  Sodium 135 - 145 mmol/L 137 140 136  Potassium 3.5 - 5.1 mmol/L 4.2 4.2 4.5  Chloride 98 - 111  mmol/L 103 105 98  CO2 22 - 32 mmol/L 21(L) 24 27  Calcium 8.9 - 10.3 mg/dL 8.1(L) 7.9(L) 8.2(L)  Total Protein 6.5 - 8.1 g/dL - 6.5 7.5  Total Bilirubin 0.3 - 1.2 mg/dL - 0.7 0.6  Alkaline Phos 38 - 126 U/L - 119 120  AST 15 - 41 U/L - 26 26  ALT 0 - 44 U/L - 21 24        SIGNED: Deatra James, MD, FACP, FHM. [facility],  Pager [phone]  If 7PM-7AM, please contact night-coverage [email or web address], Password [facility] 07/05/2018, 11:07 AM

## 2018-07-06 ENCOUNTER — Inpatient Hospital Stay (HOSPITAL_COMMUNITY): Payer: Medicare HMO

## 2018-07-06 LAB — ANTI-DNA ANTIBODY, DOUBLE-STRANDED: ds DNA Ab: <1 IU/mL (ref 0–9)

## 2018-07-06 LAB — BASIC METABOLIC PANEL
Anion gap: 15 (ref 5–15)
BUN: 85 mg/dL — ABNORMAL HIGH (ref 8–23)
CO2: 21 mmol/L — ABNORMAL LOW (ref 22–32)
Calcium: 8.4 mg/dL — ABNORMAL LOW (ref 8.9–10.3)
Chloride: 105 mmol/L (ref 98–111)
Creatinine, Ser: 4.37 mg/dL — ABNORMAL HIGH (ref 0.44–1.00)
GFR calc Af Amer: 11 mL/min — ABNORMAL LOW (ref >60)
GFR calc non Af Amer: 10 mL/min — ABNORMAL LOW (ref >60)
Glucose, Bld: 224 mg/dL — ABNORMAL HIGH (ref 70–99)
Potassium: 3.5 mmol/L (ref 3.5–5.1)
Sodium: 141 mmol/L (ref 135–145)

## 2018-07-06 LAB — CBC WITH DIFFERENTIAL/PLATELET
Abs Immature Granulocytes: 0.09 10*3/uL — ABNORMAL HIGH (ref 0.00–0.07)
Basophils Absolute: 0 10*3/uL (ref 0.0–0.1)
Basophils Relative: 0 %
Eosinophils Absolute: 0 10*3/uL (ref 0.0–0.5)
Eosinophils Relative: 0 %
HCT: 28.7 % — ABNORMAL LOW (ref 36.0–46.0)
Hemoglobin: 9.3 g/dL — ABNORMAL LOW (ref 12.0–15.0)
Immature Granulocytes: 1 %
Lymphocytes Relative: 5 %
Lymphs Abs: 0.8 10*3/uL (ref 0.7–4.0)
MCH: 27.7 pg (ref 26.0–34.0)
MCHC: 32.4 g/dL (ref 30.0–36.0)
MCV: 85.4 fL (ref 80.0–100.0)
Monocytes Absolute: 0.2 10*3/uL (ref 0.1–1.0)
Monocytes Relative: 1 %
Neutro Abs: 16 10*3/uL — ABNORMAL HIGH (ref 1.7–7.7)
Neutrophils Relative %: 93 %
Platelets: 508 10*3/uL — ABNORMAL HIGH (ref 150–400)
RBC: 3.36 MIL/uL — ABNORMAL LOW (ref 3.87–5.11)
RDW: 14.6 % (ref 11.5–15.5)
WBC: 17.1 10*3/uL — ABNORMAL HIGH (ref 4.0–10.5)
nRBC: 0 % (ref 0.0–0.2)

## 2018-07-06 LAB — ANTINUCLEAR ANTIBODIES, IFA: ANA Ab, IFA: NEGATIVE

## 2018-07-06 LAB — GLUCOSE, CAPILLARY
Glucose-Capillary: 187 mg/dL — ABNORMAL HIGH (ref 70–99)
Glucose-Capillary: 203 mg/dL — ABNORMAL HIGH (ref 70–99)
Glucose-Capillary: 166 mg/dL — ABNORMAL HIGH (ref 70–99)
Glucose-Capillary: 287 mg/dL — ABNORMAL HIGH (ref 70–99)

## 2018-07-06 LAB — HEPATITIS PANEL, ACUTE
HCV Ab: 0.2 s/co ratio (ref 0.0–0.9)
Hep A IgM: NEGATIVE
Hep B C IgM: POSITIVE — AB
Hepatitis B Surface Ag: NEGATIVE

## 2018-07-06 LAB — GLOMERULAR BASEMENT MEMBRANE ANTIBODIES: GBM Ab: 3 units (ref 0–20)

## 2018-07-06 LAB — MPO/PR-3 (ANCA) ANTIBODIES
ANCA Proteinase 3: 23.2 U/mL — ABNORMAL HIGH (ref 0.0–3.5)
Myeloperoxidase Abs: <9 U/mL (ref 0.0–9.0)

## 2018-07-06 LAB — ANA: Anti Nuclear Antibody (ANA): NEGATIVE

## 2018-07-06 MED ORDER — SODIUM CHLORIDE 0.9 % IV SOLN
500.0000 mg | Freq: Every day | INTRAVENOUS | Status: DC
  Administered 2018-07-07: 500 mg via INTRAVENOUS
  Filled 2018-07-06 (×2): qty 4

## 2018-07-06 MED ORDER — LIDOCAINE HCL (PF) 1 % IJ SOLN
INTRAMUSCULAR | Status: AC
  Filled 2018-07-06: qty 30

## 2018-07-06 MED ORDER — SODIUM CHLORIDE 0.9 % IV SOLN
510.0000 mg | Freq: Once | INTRAVENOUS | Status: AC
  Administered 2018-07-06: 510 mg via INTRAVENOUS
  Filled 2018-07-06: qty 17

## 2018-07-06 MED ORDER — FENTANYL CITRATE (PF) 100 MCG/2ML IJ SOLN
INTRAMUSCULAR | Status: AC
  Filled 2018-07-06: qty 2

## 2018-07-06 MED ORDER — MIDAZOLAM HCL 2 MG/2ML IJ SOLN
INTRAMUSCULAR | Status: AC | PRN
  Administered 2018-07-06: 1 mg via INTRAVENOUS

## 2018-07-06 MED ORDER — FENTANYL CITRATE (PF) 100 MCG/2ML IJ SOLN
INTRAMUSCULAR | Status: AC | PRN
  Administered 2018-07-06: 25 ug via INTRAVENOUS

## 2018-07-06 MED ORDER — SODIUM BICARBONATE 650 MG PO TABS
1300.0000 mg | ORAL_TABLET | Freq: Two times a day (BID) | ORAL | Status: DC
  Administered 2018-07-06 – 2018-07-09 (×7): 1300 mg via ORAL
  Filled 2018-07-06 (×7): qty 2

## 2018-07-06 MED ORDER — MIDAZOLAM HCL 2 MG/2ML IJ SOLN
INTRAMUSCULAR | Status: AC
  Filled 2018-07-06: qty 2

## 2018-07-06 MED ORDER — FUROSEMIDE 80 MG PO TABS
160.0000 mg | ORAL_TABLET | Freq: Two times a day (BID) | ORAL | Status: DC
  Administered 2018-07-06 – 2018-07-09 (×7): 160 mg via ORAL
  Filled 2018-07-06 (×7): qty 2

## 2018-07-06 NOTE — Procedures (Signed)
Interventional Radiology Procedure:   Indications: AKI  Procedure: US guided random renal biopsy  Findings: 2 cores from right kidney lower pole  Complications: none     EBL: Less than 10 ml  Plan: Bedrest today.   Julia Johns R. Anselm Pancoast, MD  Pager: [phone]

## 2018-07-06 NOTE — Progress Notes (Signed)
Subjective: Interval History: has no complaint .  Objective: Vital signs in last 24 hours: Temp:  [98.3 F (36.8 C)-98.5 F (36.9 C)] 98.3 F (36.8 C) (06/15 0900) Pulse Rate:  [67-79] 70 (06/15 0900) Resp:  [18-21] 18 (06/15 0900) BP: (125-130)/(59-78) 130/62 (06/15 0900) SpO2:  [91 %-99 %] 95 % (06/15 0900) Weight:  [88.2 kg] 88.2 kg (06/14 2119) Weight change: 0 kg  Intake/Output from previous day: 06/14 0701 - 06/15 0700 In: 650 [P.O.:600; IV Piggyback:50] Out: 1200 [Urine:1200] Intake/Output this shift: No intake/output data recorded.  General appearance: alert, cooperative, no distress, moderately obese and pale Resp: rales bibasilar Cardio: S1, S2 normal and systolic murmur: systolic ejection 2/6, crescendo and decrescendo at 2nd left intercostal space GI: obese, pos, bs,soft, liver down 5 cm Extremities: edema 2-3+  Lab Results: Recent Labs    07/05/18 0709 07/06/18 0638  WBC 16.0* 17.1*  HGB 8.8* 9.3*  HCT 28.0* 28.7*  PLT 482* 508*   BMET:  Recent Labs    07/05/18 0709 07/06/18 0638  NA 137 141  K 4.2 3.5  CL 103 105  CO2 21* 21*  GLUCOSE 205* 224*  BUN 66* 85*  CREATININE 4.27* 4.37*  CALCIUM 8.1* 8.4*   No results for input(s): PTH in the last 72 hours. Iron Studies:  Recent Labs    07/05/18 1101  IRON 14*  TIBC 155*    Studies/Results: Dg Chest 2 View  Result Date: 07/05/2018 CLINICAL DATA:  Follow-up. EXAM: CHEST - 2 VIEW COMPARISON:  07/03/2018 FINDINGS: Lung volumes remain low. The cardiomediastinal silhouette is unchanged. There is peribronchial cuffing with increased patchy opacity in the perihilar regions bilaterally. Left greater than right basilar airspace opacities have not significantly changed with masslike left basilar density on prior CT. Small pleural effusions are suspected. No pneumothorax is identified. IMPRESSION: Low lung volumes with mild worsening of bilateral lung opacities which may reflect edema or pneumonia.  Electronically Signed   By: Logan Bores M.D.   On: 07/05/2018 08:07     Assessment/Plan: 1 AKI vol xs mild acidemia. 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 Low AGBM. Needs Comp, ANA, .  Needs bicarb, diuresis. May need HD soon.On steroid 2 Pulm infilt 3 DM controlled , steroids complicate 4 Obesity P Bx, Na bicarb, Fe, Lasix, steroids, control bs.     LOS: 3 days   Jeneen Rinks Reola Buckles 07/06/2018,10:32 AM

## 2018-07-06 NOTE — Progress Notes (Signed)
Notified MD Adhikari via secure message that lab called and stated that the labs C3, C4 and ANA/IF were taken yesterday.   Paulla Fore, RN

## 2018-07-06 NOTE — Progress Notes (Signed)
PROGRESS NOTE    Julia Johns  QMG:[id number] DOB: 1950-06-13 DOA: 07/03/2018 PCP: Patient, No Pcp Per   Brief Narrative: Patient is a 68 year old female who was recently discharged from here after work-up for shortness of breath and hemoptysis. Extensive work-up in house revealed 4 x 3 cm LLL mass-likeconsolidation as well as paraspinal fluid collection. Work-up at the time revealed negative AFB with an indeterminant not QuantiFERON gold,negative SARS-CoV-2, positivec-ANCA titers 1-80 with a sed rate of 124 and a CRP of 38.  Patient underwent a bronchoscopy on 06/18/2018 and repeat bronchoscopy on 06/25/2018 with inconclusive pathology findings but no clear evidence of malignancy. Patient was followed by her PCP as an outpatient.  She was complaining of dysuria.  UA showed hematuria and she was started on ciprofloxacin.  She was found to have new acute renal failure and was sent to the emergency department.  She also has new onset diabetes mellitus. Nephrology is following her here.  Plan for kidney biopsy today.  Assessment & Plan:   Principal Problem:   Acute renal failure (ARF) (HCC) Active Problems:   Paravertebral mass   Mass of lower lobe of left lung   Bilateral lower extremity edema   Hematuria   Yeast infection involving the vagina and surrounding area   Hypoalbuminemia   Dysuria  Acute renal failure: New onset.  Creatinine in the range of 4 .  Nephrology following.  Renal ultrasound consistent with medical renal disease.  On Foley catheter.  Nephrology has initiated empiric IV bolus steroids. She was on lasix at home and diuretics are on hold.  Normocytic anemia: Iron studies shows low iron of 14.  She might benefit from iron infusion.  Hypoalbuminemia: Most likely history with acute renal failure.  Lower extremity edema has improved.  Hematuria: Kidney biopsy pending.  Vaginitis: Currently asymptomatic.  Treated with fluconazole 200 mg once  New onset  diabetes mellitus: Hemoglobin A1C of 7.9.  Started on glipizide.  Leukocytosis: Most likely reactive.  No signs of infection, sepsis.  Antibiotics not started.  Lower extremity edema/rash: Edema significantly improved now.  Most likely associated with hypoalbuminemia.  Lung mass:Patient had a masslike consolidation with bilateral pleural effusions.Her  bronchoscopy was negative for malignancy. Chest x-ray today shows decreased effusions and possible resolution of mass. Have not ordered a chest CT  given poor renal function however a contrasted CT might be beneficial once her kidney function has improved.  Paravertebral fluid collection:As per CT abdomen/Pelvis done on last hospitalization. Does not complain of any back pain and she does not have any focal symptoms.  During last hospitalization, neurosurgery, neurology, CT surgery were consulted and she was not recommended any further intervention.  Deconditioning/debility: Patient seen by physical therapy and recommended home health on discharge.          DVT prophylaxis:Lovenox Code Status: Full Family Communication: None present at the bedside Disposition Plan: Home after full work-up   Consultants: Nephrology  Procedures:None  Antimicrobials: None Anti-infectives (From admission, onward)   Start     Dose/Rate Route Frequency Ordered Stop   07/03/18 1730  fluconazole (DIFLUCAN) tablet 200 mg     200 mg Oral  Once 07/03/18 1718 07/03/18 2308      Subjective: Patient seen and examined the bedside this morning.  Hemodynamically stable.  Remains weak, mostly lying on the bed.  Lower extremity edema has improved.  Denies any shortness of breath or chest pain.  Waiting for kidney biopsy today.  Objective: Vitals:   07/05/18  2694 07/05/18 1618 07/05/18 2119 07/06/18 0450  BP: 116/64 130/62 (!) 130/59 125/78  Pulse: 76 79 67 69  Resp: 18 18 (!) 21 20  Temp: 98.2 F (36.8 C) 98.5 F (36.9 C) 98.5 F (36.9 C) 98.4 F (36.9  C)  TempSrc: Oral Oral Oral   SpO2: 95% 99% 93% 91%  Weight:   88.2 kg   Height:        Intake/Output Summary (Last 24 hours) at 07/06/2018 0820 Last data filed at 07/06/2018 0600 Gross per 24 hour  Intake 650 ml  Output 1200 ml  Net -550 ml   Filed Weights   07/03/18 2047 07/04/18 2121 07/05/18 2119  Weight: 88.1 kg 88.2 kg 88.2 kg    Examination:  General exam:Not in distress,obese, weak HEENT:PERRL,Oral mucosa moist, Ear/Nose normal on gross exam Respiratory system: Bilateral equal air entry, normal vesicular breath sounds, no wheezes or crackles  Cardiovascular system: S1 & S2 heard, RRR. No JVD, murmurs, rubs, gallops or clicks. Improved  pedal edema. Gastrointestinal system: Abdomen is distended, soft and nontender. No organomegaly or masses felt. Normal bowel sounds heard. Central nervous system: Alert and oriented. No focal neurological deficits. Extremities: Trace edema on lower extremities, no clubbing ,no cyanosis, distal peripheral pulses palpable. Skin: Minor skin breakdowns on bilateral lower extremities with  desquamation,no icterus ,no pallor  Data Reviewed: I have personally reviewed following labs and imaging studies  CBC: Recent Labs  Lab 07/03/18 1249 07/04/18 1345 07/05/18 0709  WBC 17.0* 17.5* 16.0*  HGB 9.5* 9.1* 8.8*  HCT 30.1* 28.5* 28.0*  MCV 87.8 87.4 87.5  PLT 474* 442* 854*   Basic Metabolic Panel: Recent Labs  Lab 07/03/18 1249 07/04/18 1345 07/05/18 0709  NA 136 140 137  K 4.5 4.2 4.2  CL 98 105 103  CO2 27 24 21*  GLUCOSE 135* 168* 205*  BUN 56* 57* 66*  CREATININE 3.63* 3.91* 4.27*  CALCIUM 8.2* 7.9* 8.1*   GFR: Estimated Creatinine Clearance: 14.4 mL/min (A) (by C-G formula based on SCr of 4.27 mg/dL (H)). Liver Function Tests: Recent Labs  Lab 07/03/18 1249 07/04/18 1345  AST 26 26  ALT 24 21  ALKPHOS 120 119  BILITOT 0.6 0.7  PROT 7.5 6.5  ALBUMIN 2.1* 1.8*   No results for input(s): LIPASE, AMYLASE in the  last 168 hours. No results for input(s): AMMONIA in the last 168 hours. Coagulation Profile: Recent Labs  Lab 07/05/18 1427  INR 1.2   Cardiac Enzymes: No results for input(s): CKTOTAL, CKMB, CKMBINDEX, TROPONINI in the last 168 hours. BNP (last 3 results) No results for input(s): PROBNP in the last 8760 hours. HbA1C: Recent Labs    07/03/18 1721  HGBA1C 7.9*   CBG: Recent Labs  Lab 07/05/18 1117 07/05/18 1616 07/05/18 2118 07/06/18 0450 07/06/18 0728  GLUCAP 198* 126* 205* 187* 203*   Lipid Profile: No results for input(s): CHOL, HDL, LDLCALC, TRIG, CHOLHDL, LDLDIRECT in the last 72 hours. Thyroid Function Tests: No results for input(s): TSH, T4TOTAL, FREET4, T3FREE, THYROIDAB in the last 72 hours. Anemia Panel: Recent Labs    07/05/18 1101  TIBC 155*  IRON 14*   Sepsis Labs: Recent Labs  Lab 07/04/18 1345  PROCALCITON 0.29    Recent Results (from the past 240 hour(s))  Urine culture     Status: None   Collection Time: 07/03/18  3:56 PM   Specimen: Urine, Clean Catch  Result Value Ref Range Status   Specimen Description URINE, CLEAN CATCH  Final   Special Requests NONE  Final   Culture   Final    NO GROWTH Performed at [facility], [place] [address] [place], [place] [address]    Report Status 07/04/2018 FINAL  Final  Novel Coronavirus,NAA,(SEND-OUT TO REF LAB - TAT 24-48 hrs); Hosp Order     Status: None   Collection Time: 07/03/18  5:25 PM   Specimen: Nasopharyngeal Swab; Respiratory  Result Value Ref Range Status   SARS-CoV-2, NAA NOT DETECTED NOT DETECTED Final    Comment: (NOTE) This test was developed and its performance characteristics determined by [employer]. This test has not been FDA cleared or approved. This test has been authorized by FDA under an Emergency Use Authorization (EUA). This test is only authorized for the duration of time the declaration that circumstances exist justifying the authorization of the emergency  use of in vitro diagnostic tests for detection of SARS-CoV-2 virus and/or diagnosis of COVID-19 infection under section 564(b)(1) of the Act, 21 U.S.C. 031RXY-5(O)(5), unless the authorization is terminated or revoked sooner. When diagnostic testing is negative, the possibility of a false negative result should be considered in the context of a patient's recent exposures and the presence of clinical signs and symptoms consistent with COVID-19. An individual without symptoms of COVID-19 and who is not shedding SARS-CoV-2 virus would expect to have a negative (not detected) result in this assay. Performed  At: [facility] [address] [place], [place] [id number] Rush Farmer MD MN:[id number]    [place]  Final    Comment: Performed at [facility], [place] [address] [place], [place] [address]         Radiology Studies: Dg Chest 2 View  Result Date: 07/05/2018 CLINICAL DATA:  Follow-up. EXAM: CHEST - 2 VIEW COMPARISON:  07/03/2018 FINDINGS: Lung volumes remain low. The cardiomediastinal silhouette is unchanged. There is peribronchial cuffing with increased patchy opacity in the perihilar regions bilaterally. Left greater than right basilar airspace opacities have not significantly changed with masslike left basilar density on prior CT. Small pleural effusions are suspected. No pneumothorax is identified. IMPRESSION: Low lung volumes with mild worsening of bilateral lung opacities which may reflect edema or pneumonia. Electronically Signed   By: Logan Bores M.D.   On: 07/05/2018 08:07        Scheduled Meds: . [START ON 07/07/2018] enoxaparin (LOVENOX) injection  30 mg Subcutaneous Q24H  . glipiZIDE  5 mg Oral QAC breakfast  . insulin aspart  0-9 Units Subcutaneous TID WC  . living well with diabetes book- in spanish   Does not apply Once   Continuous Infusions: . methylPREDNISolone (SOLU-MEDROL) injection 500 mg (07/05/18 2126)     LOS:  3 days    Time spent: 35 mins.More than 50% of that time was spent in counseling and/or coordination of care.      Shelly Coss, MD [facility] Pager [phone]  If 7PM-7AM, please contact night-coverage [email or web address] Password [email or web address] 07/06/2018, 8:20 AM

## 2018-07-06 NOTE — Plan of Care (Signed)
  Problem: Clinical Measurements: Goal: Respiratory complications will improve Outcome: Progressing   Problem: Nutrition: Goal: Adequate nutrition will be maintained Outcome: Progressing   Problem: Coping: Goal: Level of anxiety will decrease Outcome: Progressing   

## 2018-07-06 NOTE — Progress Notes (Signed)
Updated son Julia Johns on pt's status. Son explained that pt at baseline was able to walk independently prior to admission. Son was updated on renal biopsy status and he stated that he would call before shift change. Explained to son about pt's recent diagnosis of DM and he explained that this RN would provide education on diet and her medications.   Paulla Fore, RN

## 2018-07-06 NOTE — Progress Notes (Addendum)
Inpatient Diabetes Program Recommendations  AACE/ADA: New Consensus Statement on Inpatient Glycemic Control (2015)  Target Ranges:  Prepandial:   less than 140 mg/dL      Peak postprandial:   less than 180 mg/dL (1-2 hours)      Critically ill patients:  140 - 180 mg/dL   Lab Results  Component Value Date   GLUCAP 166 (H) 07/06/2018   HGBA1C 7.9 (H) 07/03/2018    Review of Glycemic Control Results for Julia Johns, Julia Johns (MRN [id number]) as of 07/06/2018 14:27  Ref. Range 07/05/2018 21:18 07/06/2018 04:50 07/06/2018 07:28 07/06/2018 12:55  Glucose-Capillary Latest Ref Range: 70 - 99 mg/dL 205 (H) 187 (H) 203 (H) 166 (H)   Diabetes history: DM 2- New diagnosis Outpatient Diabetes medications: None Current orders for Inpatient glycemic control:  Solumedrol 500 mg daily Novolog sensitive tid with meals  Inpatient Diabetes Program Recommendations:    Note new dx. Of DM- patient had procedure today, therefore will speak with her on 6/16- CBG's increased slightly due to steroids.  May consider increasing Novolog correction to moderate while on steroids.  DM booklet given to patient over the weekend regarding DM by RN.  Thanks,  Adah Perl, RN, BC-ADM Inpatient Diabetes Coordinator Pager [phone]

## 2018-07-06 NOTE — Consult Note (Signed)
[facility] Nurse wound consult note Reason for Consult: Consult requested for bilat feet. 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  There are also several partial thickness wounds where previous blisters have ruptured to bilat feet Wound bed: pink moist partial thickness wounds to patchy areas, no odor, drainage, or fluctuance Dressing procedure/placement/frequency: Xeroform gauze to promote drying and healing to bilat feet. Please re-consult if further assistance is needed.  Thank-you,  Julien Girt MSN, [place], [place], [place], [place]

## 2018-07-07 ENCOUNTER — Inpatient Hospital Stay: Payer: Medicare HMO | Admitting: Critical Care Medicine

## 2018-07-07 LAB — CBC WITH DIFFERENTIAL/PLATELET
Abs Immature Granulocytes: 0.09 10*3/uL — ABNORMAL HIGH (ref 0.00–0.07)
Basophils Absolute: 0 10*3/uL (ref 0.0–0.1)
Basophils Relative: 0 %
Eosinophils Absolute: 0 10*3/uL (ref 0.0–0.5)
Eosinophils Relative: 0 %
HCT: 28 % — ABNORMAL LOW (ref 36.0–46.0)
Hemoglobin: 9 g/dL — ABNORMAL LOW (ref 12.0–15.0)
Immature Granulocytes: 1 %
Lymphocytes Relative: 4 %
Lymphs Abs: 0.7 10*3/uL (ref 0.7–4.0)
MCH: 27 pg (ref 26.0–34.0)
MCHC: 32.1 g/dL (ref 30.0–36.0)
MCV: 84.1 fL (ref 80.0–100.0)
Monocytes Absolute: 0.5 10*3/uL (ref 0.1–1.0)
Monocytes Relative: 3 %
Neutro Abs: 14 10*3/uL — ABNORMAL HIGH (ref 1.7–7.7)
Neutrophils Relative %: 92 %
Platelets: 518 10*3/uL — ABNORMAL HIGH (ref 150–400)
RBC: 3.33 MIL/uL — ABNORMAL LOW (ref 3.87–5.11)
RDW: 14.5 % (ref 11.5–15.5)
WBC: 15.3 10*3/uL — ABNORMAL HIGH (ref 4.0–10.5)
nRBC: 0 % (ref 0.0–0.2)

## 2018-07-07 LAB — ANTISTREPTOLYSIN O TITER: ASO: 124 IU/mL (ref 0.0–200.0)

## 2018-07-07 LAB — GLUCOSE, CAPILLARY
Glucose-Capillary: 223 mg/dL — ABNORMAL HIGH (ref 70–99)
Glucose-Capillary: 265 mg/dL — ABNORMAL HIGH (ref 70–99)
Glucose-Capillary: 149 mg/dL — ABNORMAL HIGH (ref 70–99)
Glucose-Capillary: 272 mg/dL — ABNORMAL HIGH (ref 70–99)

## 2018-07-07 LAB — RENAL FUNCTION PANEL
Albumin: 2.3 g/dL — ABNORMAL LOW (ref 3.5–5.0)
Anion gap: 16 — ABNORMAL HIGH (ref 5–15)
BUN: 107 mg/dL — ABNORMAL HIGH (ref 8–23)
CO2: 24 mmol/L (ref 22–32)
Calcium: 8.5 mg/dL — ABNORMAL LOW (ref 8.9–10.3)
Chloride: 101 mmol/L (ref 98–111)
Creatinine, Ser: 4.31 mg/dL — ABNORMAL HIGH (ref 0.44–1.00)
GFR calc Af Amer: 11 mL/min — ABNORMAL LOW (ref >60)
GFR calc non Af Amer: 10 mL/min — ABNORMAL LOW (ref >60)
Glucose, Bld: 233 mg/dL — ABNORMAL HIGH (ref 70–99)
Phosphorus: 8.4 mg/dL — ABNORMAL HIGH (ref 2.5–4.6)
Potassium: 3.1 mmol/L — ABNORMAL LOW (ref 3.5–5.1)
Sodium: 141 mmol/L (ref 135–145)

## 2018-07-07 LAB — HCV COMMENT:

## 2018-07-07 LAB — C4 COMPLEMENT: Complement C4, Body Fluid: 31 mg/dL (ref 14–44)

## 2018-07-07 LAB — HEPATITIS C ANTIBODY (REFLEX): HCV Ab: <0.1 s/co ratio (ref 0.0–0.9)

## 2018-07-07 LAB — HEPATITIS B SURFACE ANTIGEN: Hepatitis B Surface Ag: NEGATIVE

## 2018-07-07 LAB — HEPATITIS B SURFACE ANTIBODY,QUALITATIVE: Hep B S Ab: NONREACTIVE

## 2018-07-07 LAB — C3 COMPLEMENT: C3 Complement: 146 mg/dL (ref 82–167)

## 2018-07-07 MED ORDER — INSULIN ASPART 100 UNIT/ML ~~LOC~~ SOLN
0.0000 [IU] | Freq: Three times a day (TID) | SUBCUTANEOUS | Status: DC
  Administered 2018-07-07: 2 [IU] via SUBCUTANEOUS
  Administered 2018-07-07: 8 [IU] via SUBCUTANEOUS
  Administered 2018-07-08: 5 [IU] via SUBCUTANEOUS
  Administered 2018-07-08 – 2018-07-09 (×3): 8 [IU] via SUBCUTANEOUS
  Administered 2018-07-09: 11 [IU] via SUBCUTANEOUS
  Administered 2018-07-09: 5 [IU] via SUBCUTANEOUS

## 2018-07-07 MED ORDER — PREDNISONE 50 MG PO TABS
60.0000 mg | ORAL_TABLET | Freq: Every day | ORAL | Status: DC
  Administered 2018-07-08 – 2018-07-09 (×2): 60 mg via ORAL
  Filled 2018-07-07 (×2): qty 1

## 2018-07-07 MED ORDER — DIPHENHYDRAMINE HCL 50 MG/ML IJ SOLN
50.0000 mg | Freq: Once | INTRAMUSCULAR | Status: AC
  Administered 2018-07-08: 50 mg via INTRAVENOUS
  Filled 2018-07-07: qty 1

## 2018-07-07 MED ORDER — DEXAMETHASONE SODIUM PHOSPHATE 10 MG/ML IJ SOLN: 12.0000 mg | Freq: Once | INTRAMUSCULAR | Status: DC

## 2018-07-07 MED ORDER — DEXAMETHASONE SODIUM PHOSPHATE 10 MG/ML IJ SOLN
12.0000 mg | Freq: Once | INTRAMUSCULAR | Status: AC
  Administered 2018-07-08: 12 mg via INTRAVENOUS
  Filled 2018-07-07: qty 1.2

## 2018-07-07 MED ORDER — FUROSEMIDE 80 MG PO TABS: 80.0000 mg | ORAL_TABLET | Freq: Two times a day (BID) | ORAL | Status: DC

## 2018-07-07 MED ORDER — INSULIN ASPART 100 UNIT/ML ~~LOC~~ SOLN
0.0000 [IU] | Freq: Every day | SUBCUTANEOUS | Status: DC
  Administered 2018-07-07: 3 [IU] via SUBCUTANEOUS
  Administered 2018-07-08: 2 [IU] via SUBCUTANEOUS

## 2018-07-07 MED ORDER — NYSTATIN 100000 UNIT/GM EX CREA
TOPICAL_CREAM | Freq: Two times a day (BID) | CUTANEOUS | Status: DC
  Administered 2018-07-07 – 2018-07-08 (×3): via TOPICAL
  Administered 2018-07-09: 1 via TOPICAL
  Administered 2018-07-09: 01:00:00 via TOPICAL
  Filled 2018-07-07 (×2): qty 15

## 2018-07-07 MED ORDER — ACETAMINOPHEN 325 MG PO TABS
650.0000 mg | ORAL_TABLET | Freq: Once | ORAL | Status: AC
  Administered 2018-07-08: 650 mg via ORAL
  Filled 2018-07-07: qty 2

## 2018-07-07 MED ORDER — SODIUM CHLORIDE 0.9 % IV SOLN
375.0000 mg/m2 | Freq: Once | INTRAVENOUS | Status: DC
  Filled 2018-07-07: qty 80

## 2018-07-07 MED ORDER — DIPHENHYDRAMINE HCL 50 MG/ML IJ SOLN: 50.0000 mg | Freq: Once | INTRAMUSCULAR | Status: DC

## 2018-07-07 MED ORDER — POTASSIUM CHLORIDE CRYS ER 20 MEQ PO TBCR
40.0000 meq | EXTENDED_RELEASE_TABLET | Freq: Once | ORAL | Status: AC
  Administered 2018-07-07: 40 meq via ORAL
  Filled 2018-07-07: qty 2

## 2018-07-07 MED ORDER — ACETAMINOPHEN 325 MG PO TABS: 650.0000 mg | ORAL_TABLET | Freq: Once | ORAL | Status: DC

## 2018-07-07 NOTE — Progress Notes (Signed)
Subjective: Interval History: has complaints stp, churning.  Objective: Vital signs in last 24 hours: Temp:  [97.5 F (36.4 C)-98.4 F (36.9 C)] 98 F (36.7 C) (06/16 1010) Pulse Rate:  [57-87] 71 (06/16 1010) Resp:  [11-33] 18 (06/16 0848) BP: (99-124)/(50-87) 111/53 (06/16 1010) SpO2:  [89 %-98 %] 95 % (06/16 1010) Weight:  [85.6 kg] 85.6 kg (06/15 2024) Weight change: -2.561 kg  Intake/Output from previous day: 06/15 0701 - 06/16 0700 In: 250 [P.O.:100; IV Piggyback:150] Out: 3025 [Urine:3025] Intake/Output this shift: Total I/O In: -  Out: 400 [Urine:400]  General appearance: alert, cooperative, no distress, moderately obese and pale Resp: clear to auscultation bilaterally Cardio: S1, S2 normal and systolic murmur: systolic ejection 2/6, crescendo and decrescendo at 2nd left intercostal space GI: obese, pos bs, soft, Extremities: edema 3+  Lab Results: Recent Labs    07/06/18 0638 07/07/18 0553  WBC 17.1* 15.3*  HGB 9.3* 9.0*  HCT 28.7* 28.0*  PLT 508* 518*   BMET:  Recent Labs    07/06/18 0638 07/07/18 0553  NA 141 141  K 3.5 3.1*  CL 105 101  CO2 21* 24  GLUCOSE 224* 233*  BUN 85* 107*  CREATININE 4.37* 4.31*  CALCIUM 8.4* 8.5*   No results for input(s): PTH in the last 72 hours. Iron Studies:  Recent Labs    07/05/18 1101  IRON 14*  TIBC 155*    Studies/Results: US Biopsy (kidney)  Result Date: 07/06/2018 INDICATION: 68 year old with acute kidney injury.  Request for renal biopsy. EXAM: ULTRASOUND-GUIDED RIGHT RENAL BIOPSY MEDICATIONS: None. ANESTHESIA/SEDATION: Moderate (conscious) sedation was employed during this procedure. A total of Versed 1.0 mg and Fentanyl 25 mcg was administered intravenously. Moderate Sedation Time: 16 minutes. The patient's level of consciousness and vital signs were monitored continuously by radiology nursing throughout the procedure under my direct supervision. FLUOROSCOPY TIME:  None COMPLICATIONS: None  immediate. PROCEDURE: Informed written consent was obtained from the patient with a Spanish translator after a thorough discussion of the procedural risks, benefits and alternatives. All questions were addressed. Maximal Sterile Barrier Technique was utilized including caps, mask, sterile gowns, sterile gloves, sterile drape, hand hygiene and skin antiseptic. A timeout was performed prior to the initiation of the procedure. Patient was placed prone and both kidneys were evaluated with ultrasound. The right kidney lower pole was targeted for biopsy. Right flank was prepped with chlorhexidine and sterile field was created. Skin and soft tissues were anesthetized with 1% lidocaine. Using ultrasound guidance, 16 gauge core biopsy needle was directed into the right kidney lower pole and a biopsy was obtained. Specimen placed in saline. A second core biopsy was obtained with ultrasound guidance. Two adequate core specimens were obtained. Follow-up ultrasound imaging was performed. Bandage placed over the puncture site. FINDINGS: Bilateral echogenic kidneys. Negative for hydronephrosis. Two adequate core biopsies obtained from the right kidney lower pole. No significant bleeding or hematoma formation following the core biopsies. IMPRESSION: Ultrasound-guided core biopsy of the right kidney lower pole. Electronically Signed   By: Markus Daft M.D.   On: 07/06/2018 15:24    I have reviewed the patient's current medications.  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  Vol xs. Cr stable will add diuretic , on steroids 2 Anemia got iv Fe 3 DM worse with steroids 4 obesity 5 pulm infilt P Lasix, steroids, Bx    LOS: 4 days   Jeneen Rinks Shyniece Scripter 07/07/2018,11:42 AM

## 2018-07-07 NOTE — Consult Note (Addendum)
WOC re-consult: Refer to consult note on 6/15 for bilat feet.  Requested today to assess thigh wounds.  Pt has patchy areas of partial thickness skin loss to bilat thighs; appearance is consistent with previous blisters which have ruptured, as was the case with her feet.  Right inner thigh with full thickness wound; 4X4X.1cm, red and moist, small amt tan drainage, no odor.   Plan: Foam dressing to absorb drainage, protect and promote healing.  Please re-consult if further assistance is needed.  Thank-you,  Julien Girt MSN, [place], [place], [place], [place]

## 2018-07-07 NOTE — Progress Notes (Signed)
[place], son, to update regarding pt's day. Answered all questions and no concerns at this time.  Paulla Fore, RN

## 2018-07-07 NOTE — Progress Notes (Signed)
Physical Therapy Treatment Patient Details Name: Julia Johns MRN: [id number] DOB: [birth year] Today's Date: 07/07/2018    History of Present Illness 68 y.o. female who was recently discharged from [facility] 06/26/2018 for work-up of shortness of breath and hemoptysis.  Extensive work-up in house revealed 4 x 3 cm LLL mass-like consolidation as well as paraspinal fluid collection.  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  Patient underwent a bronchoscopy on 5/28 and repeat bronchoscopy on 6/4 with inconclusive pathology findings but no clear evidence of malignancy. Patient was seen for routine posthospitalization follow-up at [facility] 07/03/18.  At that time she complained of dysuria.  UA showed hematuria and patient was started on ciprofloxacin.  Patient was called by [facility] 07/03/18 telling her to come to the ED because she was in acute renal failure.    PT Comments    Pt was seen from chair level as CNA had gotten her up to chair, and was not interested in trying to walk due to pain in abd and her fatigue.  Translation services were ordered to work with her, and was able to do both standing with supervision and there ex to legs with HEP initiated.  Pt reports through translator that she is already doing exercises and agrees to add to her routine.  Follow her with plan to walk further, progress to home therapy as she is able.  Pt is appreciative for therapy and thanks both PT and her translator.    Follow Up Recommendations  Home health PT     Equipment Recommendations  None recommended by PT    Recommendations for Other Services       Precautions / Restrictions Precautions Precautions: Fall Precaution Comments: low fall risk but is unsteady standing Restrictions Weight Bearing Restrictions: No    Mobility  Bed Mobility                General bed mobility comments: up in chair when PT arrived with CNA  Transfers Overall transfer level: Needs assistance Equipment used: 1 person hand held assist Transfers: Sit to/from Stand Sit to Stand: Min guard         General transfer comment: using a hand hold on bed rail to stand up at chair  Ambulation/Gait             General Gait Details: declined today   Stairs             Wheelchair Mobility    Modified Rankin (Stroke Patients Only)       Balance     Sitting balance-Leahy Scale: Good     Standing balance support: Single extremity supported Standing balance-Leahy Scale: Poor                              Cognition Arousal/Alertness: Awake/alert Behavior During Therapy: WFL for tasks assessed/performed Overall Cognitive Status: Within Functional Limits for tasks assessed                                        Exercises General Exercises - Lower Extremity Ankle Circles/Pumps: AROM;Both;5 reps Quad Sets: AROM;Both;10 reps Gluteal Sets: AROM;Both;10 reps Heel Slides: AROM;AAROM;Both;10 reps Hip ABduction/ADduction: AROM;AAROM;Both;10 reps Straight Leg Raises:  AAROM;Both;10 reps Hip Flexion/Marching: AAROM;Both;10 reps    General Comments        Pertinent Vitals/Pain Pain Assessment: Faces Faces Pain Scale: Hurts even more Pain Location: L upper abdominal pain Pain Descriptors / Indicators: Grimacing Pain Intervention(s): Monitored during session;Limited activity within patient's tolerance;Premedicated before session;Repositioned    Home Living                      Prior Function            PT Goals (current goals can now be found in the care plan section) Acute Rehab PT Goals Patient Stated Goal: to feel better Progress towards PT goals: Progressing toward goals    Frequency    Min 3X/week      PT Plan      Co-evaluation              AM-PAC PT "6  Clicks" Mobility   Outcome Measure  Help needed turning from your back to your side while in a flat bed without using bedrails?: None Help needed moving from lying on your back to sitting on the side of a flat bed without using bedrails?: None Help needed moving to and from a bed to a chair (including a wheelchair)?: A Little Help needed standing up from a chair using your arms (e.g., wheelchair or bedside chair)?: A Little Help needed to walk in hospital room?: A Little Help needed climbing 3-5 steps with a railing? : A Lot 6 Click Score: 19    End of Session Equipment Utilized During Treatment: Other (comment)(Spanish translator) Activity Tolerance: Patient tolerated treatment well;Patient limited by fatigue Patient left: in chair;with call bell/phone within reach Nurse Communication: Mobility status PT Visit Diagnosis: Muscle weakness (generalized) (M62.81);Unsteadiness on feet (R26.81)     Time: 0233-4356 PT Time Calculation (min) (ACUTE ONLY): 30 min  Charges:  $Therapeutic Exercise: 8-22 mins $Therapeutic Activity: 8-22 mins                    Ramond Dial 07/07/2018, 11:41 AM  Mee Hives, PT MS Acute Rehab Dept. Number: [place] [facility] [place]

## 2018-07-07 NOTE — Progress Notes (Addendum)
Inpatient Diabetes Program Recommendations  AACE/ADA: New Consensus Statement on Inpatient Glycemic Control (2015)  Target Ranges:  Prepandial:   less than 140 mg/dL      Peak postprandial:   less than 180 mg/dL (1-2 hours)      Critically ill patients:  140 - 180 mg/dL   Lab Results  Component Value Date   GLUCAP 265 (H) 07/07/2018   HGBA1C 7.9 (H) 07/03/2018    Review of Glycemic Control Results for FREEDA, SPIVEY (MRN [id number]) as of 07/07/2018 13:49  Ref. Range 07/06/2018 20:53 07/07/2018 07:03 07/07/2018 11:18  Glucose-Capillary Latest Ref Range: 70 - 99 mg/dL 287 (H) 223 (H) 265 (H)   Diabetes history: DM 2- New diagnosis Outpatient Diabetes medications: None Current orders for Inpatient glycemic control:  Solumedrol 500 mg daily Novolog sensitive tid with meals  Inpatient Diabetes Program Recommendations:    Consider adding Novolog 3 units TID (assuming patient is consuming >50% of meals and while on steroids).   Spoke with patient regarding new onset DM. Patient more receptive and discussed using interpreter.  Reviewed patient's current A1c of 7.9%. Explained what a A1c is and what it measures. Also reviewed goal A1c with patient, importance of good glucose control @ home, and blood sugar goals. Reviewed patho of DM, role of pancreas, signs and symptoms of hypo vs hyper, when to call MD, how to check CBGs, vascular changes and co-morbidies.  Patient will need a meter at discharge. Blood glucose meter kit (includes lancets and strips) ([id number]). Encouraged patient to begin checking 2 times daily and stressed the importance of following up with PCP. Reviewed importance of keeping low carbohydrate diet and choosing foods/beverages of nutritional value. Also, discussed oral antidiabetic medications, risks vs benefits and side effects. Patient agrees and has no further questions.  Will place dietitian consult, THN, care instruction for CBG checks and CM to help provide  list of PCPs.   Thanks, Bronson Curb, MSN, RNC-OB Diabetes Coordinator [phone] (8a-5p)

## 2018-07-07 NOTE — Progress Notes (Signed)
Patient ID: Julia Johns, female   DOB: 1950/08/27, 68 y.o.   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  Active lesion, very little chronicity.  Will start Rituximab 375mg /m sq. With plans for 4 doses in weekly fashion. Cont pred at 1 mg /kg and taper.

## 2018-07-07 NOTE — Progress Notes (Deleted)
   Subjective:    Patient ID: Julia Johns, female    DOB: 01/18/1951, 68 y.o.   MRN: [id number]  HPI    Review of Systems     Objective:   Physical Exam        Assessment & Plan:

## 2018-07-07 NOTE — Progress Notes (Signed)
PROGRESS NOTE    Julia Johns  JQB:[id number] DOB: [birth year] DOA: 07/03/2018 PCP: Patient, No Pcp Per   Brief Narrative: Patient is a 68 year old female originally from [place], [place] speaking,who was recently discharged from here after work-up for shortness of breath and hemoptysis. Extensive work-up on last admission revealed 4 x 3 cm LLL mass-likeconsolidation as well as paraspinal fluid collection. Work-up at the time revealed negative AFB with an indeterminant  QuantiFERON gold,negative SARS-CoV-2, positivec-ANCA titers 1-80 with a sed rate of 124 and a CRP of 38.  Patient underwent a bronchoscopy on 06/18/2018 and repeat bronchoscopy on 06/25/2018 with inconclusive pathology findings but no clear evidence of malignancy. Patient was followed by her PCP as an outpatient.  She was complaining of dysuria.  UA showed hematuria and she was started on ciprofloxacin.  She was found to have new acute renal failure and was sent to the emergency department.  She also has new onset diabetes mellitus. 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 Underwent  kidney biopsy on 07/06/18.  Assessment & Plan:   Principal Problem:   Acute renal failure (ARF) (HCC) Active Problems:   Paravertebral mass   Mass of lower lobe of left lung   Bilateral lower extremity edema   Hematuria   Yeast infection involving the vagina and surrounding area   Hypoalbuminemia   Dysuria  Acute renal failure: New onset.   Nephrology following.  Renal ultrasound consistent with medical renal disease.  On Foley catheter.  Nephrology has initiated empiric IV bolus steroids.Also on lasix  Underwent renal biopsy in 07/06/2018.  HIV, hepatitis panel negative.  Cryoglobulin level pending.  ANA, anti-double-stranded DNA, C3-C4 normal.  ANCA proteinase 3 level elevated.  Suspicion for vasculitis.Follow up kidney biopsy.  Acute respiratory failure with  hypoxia: Currently on 2 L of oxygen per minute.Chest Xray had shown Low lung volumes with mild worsening of bilateral lung opacities which may reflect edema or pneumonia.  This is most likely pulmonary edema from her acute renal failure.  Continue diuresis.  Continue to wean oxygen as tolerated.  Low suspicion for pneumonia.  Normocytic anemia: Iron studies shows low iron of 14.  She was  Given  iron infusion.  Hematuria: Kidney biopsy report pending.Suspected vasculitis.  Vaginitis/perineal fungal rash:   Treated with fluconazole 200 mg once.Continue nystatin cream.  New onset diabetes mellitus: Hemoglobin A1C of 7.9.  Continue sliding scale insulin now.Can be started  on glipizide as an outpatient.  Diabetic coordinator following..  Leukocytosis: Most likely reactive.  No signs of infection, sepsis.  Antibiotics not started.  Lower extremity edema/rash: Edema significantly improved now.  Most likely associated with hypoalbuminemia.  Lung mass:Patient had a masslike consolidation with bilateral pleural effusions as per imagings on last hospitalization.Her  bronchoscopy was negative for malignancy. Earlier Chest x-ray  Had shown  possible resolution of mass. Have not ordered a chest CT  given poor renal function however a contrasted CT might be considered once her kidney function has improved.  Paravertebral fluid collection:As per CT abdomen/Pelvis done on last hospitalization. Does not complain of any back pain and she does not have any focal symptoms.  During last hospitalization, neurosurgery, neurology, CT surgery were consulted and she was not recommended any further intervention.  Deconditioning/debility: Patient seen by physical therapy and recommended home health on discharge.          DVT prophylaxis:Lovenox Code Status: Full Family Communication: Called son [name] and gave  update Disposition Plan: Home after full work-up   Consultants: Nephrology  Procedures:None   Antimicrobials: None Anti-infectives (From admission, onward)   Start     Dose/Rate Route Frequency Ordered Stop   07/03/18 1730  fluconazole (DIFLUCAN) tablet 200 mg     200 mg Oral  Once 07/03/18 1718 07/03/18 2308      Subjective: Patient seen and examined the bedside this morning.  Hemodynamically stable.  Looks more comfortable today.  Still weak.  Currently on 2 L of oxygen per minute.  Edema is improving. Objective: Vitals:   07/06/18 2024 07/07/18 0443 07/07/18 0848 07/07/18 1010  BP: (!) 118/53 (!) 115/51 (!) 100/50 (!) 111/53  Pulse: 62 63 83 71  Resp: 18 18 18    Temp: 98.4 F (36.9 C) (!) 97.5 F (36.4 C)  98 F (36.7 C)  TempSrc: Oral Oral  Oral  SpO2: 97% 97% 98% 95%  Weight: 85.6 kg     Height:        Intake/Output Summary (Last 24 hours) at 07/07/2018 1351 Last data filed at 07/07/2018 1100 Gross per 24 hour  Intake 250 ml  Output 3825 ml  Net -3575 ml   Filed Weights   07/04/18 2121 07/05/18 2119 07/06/18 2024  Weight: 88.2 kg 88.2 kg 85.6 kg    Examination:  General exam: Not in distress,obese,weak HEENT:PERRL,Oral mucosa moist, Ear/Nose normal on gross exam Respiratory system: Bilateral decreased air entry on the bases Cardiovascular system: S1 & S2 heard, RRR. No JVD, murmurs, rubs, gallops or clicks. Gastrointestinal system: Abdomen is nondistended, soft and nontender. No organomegaly or masses felt. Normal bowel sounds heard. Central nervous system: Alert and oriented. No focal neurological deficits. Extremities: Trace edema on bilateral lower extremities, no clubbing ,no cyanosis, distal peripheral pulses palpable. Skin:  skin breakdowns on bilateral lower extremities with  desquamation,no icterus ,no pallor  Data Reviewed: I have personally reviewed following labs and imaging studies  CBC: Recent Labs  Lab 07/03/18 1249 07/04/18 1345 07/05/18 0709 07/06/18 0638 07/07/18 0553  WBC 17.0* 17.5* 16.0* 17.1* 15.3*  NEUTROABS  --   --   --   16.0* 14.0*  HGB 9.5* 9.1* 8.8* 9.3* 9.0*  HCT 30.1* 28.5* 28.0* 28.7* 28.0*  MCV 87.8 87.4 87.5 85.4 84.1  PLT 474* 442* 482* 508* 235*   Basic Metabolic Panel: Recent Labs  Lab 07/03/18 1249 07/04/18 1345 07/05/18 0709 07/06/18 0638 07/07/18 0553  NA 136 140 137 141 141  K 4.5 4.2 4.2 3.5 3.1*  CL 98 105 103 105 101  CO2 27 24 21* 21* 24  GLUCOSE 135* 168* 205* 224* 233*  BUN 56* 57* 66* 85* 107*  CREATININE 3.63* 3.91* 4.27* 4.37* 4.31*  CALCIUM 8.2* 7.9* 8.1* 8.4* 8.5*  PHOS  --   --   --   --  8.4*   GFR: Estimated Creatinine Clearance: 14 mL/min (A) (by C-G formula based on SCr of 4.31 mg/dL (H)). Liver Function Tests: Recent Labs  Lab 07/03/18 1249 07/04/18 1345 07/07/18 0553  AST 26 26  --   ALT 24 21  --   ALKPHOS 120 119  --   BILITOT 0.6 0.7  --   PROT 7.5 6.5  --   ALBUMIN 2.1* 1.8* 2.3*   No results for input(s): LIPASE, AMYLASE in the last 168 hours. No results for input(s): AMMONIA in the last 168 hours. Coagulation Profile: Recent Labs  Lab 07/05/18 1427  INR 1.2   Cardiac Enzymes: No results for input(s):  CKTOTAL, CKMB, CKMBINDEX, TROPONINI in the last 168 hours. BNP (last 3 results) No results for input(s): PROBNP in the last 8760 hours. HbA1C: No results for input(s): HGBA1C in the last 72 hours. CBG: Recent Labs  Lab 07/06/18 0728 07/06/18 1255 07/06/18 2053 07/07/18 0703 07/07/18 1118  GLUCAP 203* 166* 287* 223* 265*   Lipid Profile: No results for input(s): CHOL, HDL, LDLCALC, TRIG, CHOLHDL, LDLDIRECT in the last 72 hours. Thyroid Function Tests: No results for input(s): TSH, T4TOTAL, FREET4, T3FREE, THYROIDAB in the last 72 hours. Anemia Panel: Recent Labs    07/05/18 1101  TIBC 155*  IRON 14*   Sepsis Labs: Recent Labs  Lab 07/04/18 1345  PROCALCITON 0.29    Recent Results (from the past 240 hour(s))  Urine culture     Status: None   Collection Time: 07/03/18  3:56 PM   Specimen: Urine, Clean Catch  Result  Value Ref Range Status   Specimen Description URINE, CLEAN CATCH  Final   Special Requests NONE  Final   Culture   Final    NO GROWTH Performed at [facility], [place] [address] [place], [place] [address]    Report Status 07/04/2018 FINAL  Final  Novel Coronavirus,NAA,(SEND-OUT TO REF LAB - TAT 24-48 hrs); Hosp Order     Status: None   Collection Time: 07/03/18  5:25 PM   Specimen: Nasopharyngeal Swab; Respiratory  Result Value Ref Range Status   SARS-CoV-2, NAA NOT DETECTED NOT DETECTED Final    Comment: (NOTE) This test was developed and its performance characteristics determined by [employer]. This test has not been FDA cleared or approved. This test has been authorized by FDA under an Emergency Use Authorization (EUA). This test is only authorized for the duration of time the declaration that circumstances exist justifying the authorization of the emergency use of in vitro diagnostic tests for detection of SARS-CoV-2 virus and/or diagnosis of COVID-19 infection under section 564(b)(1) of the Act, 21 U.S.C. 517OHY-0(V)(3), unless the authorization is terminated or revoked sooner. When diagnostic testing is negative, the possibility of a false negative result should be considered in the context of a patient's recent exposures and the presence of clinical signs and symptoms consistent with COVID-19. An individual without symptoms of COVID-19 and who is not shedding SARS-CoV-2 virus would expect to have a negative (not detected) result in this assay. Performed  At: [facility] [address] [place], [place] [id number] Rush Farmer MD NI:[id number]    [facility]  Final    Comment: Performed at [facility], [place] [address] [place], [place] [address]         Radiology Studies: US Biopsy (kidney)  Result Date: 07/06/2018 INDICATION: 27 year old with acute kidney injury.  Request for renal biopsy. EXAM: ULTRASOUND-GUIDED  RIGHT RENAL BIOPSY MEDICATIONS: None. ANESTHESIA/SEDATION: Moderate (conscious) sedation was employed during this procedure. A total of Versed 1.0 mg and Fentanyl 25 mcg was administered intravenously. Moderate Sedation Time: 16 minutes. The patient's level of consciousness and vital signs were monitored continuously by radiology nursing throughout the procedure under my direct supervision. FLUOROSCOPY TIME:  None COMPLICATIONS: None immediate. PROCEDURE: Informed written consent was obtained from the patient with a Spanish translator after a thorough discussion of the procedural risks, benefits and alternatives. All questions were addressed. Maximal Sterile Barrier Technique was utilized including caps, mask, sterile gowns, sterile gloves, sterile drape, hand hygiene and skin antiseptic. A timeout was performed prior to the initiation of the procedure. Patient  was placed prone and both kidneys were evaluated with ultrasound. The right kidney lower pole was targeted for biopsy. Right flank was prepped with chlorhexidine and sterile field was created. Skin and soft tissues were anesthetized with 1% lidocaine. Using ultrasound guidance, 16 gauge core biopsy needle was directed into the right kidney lower pole and a biopsy was obtained. Specimen placed in saline. A second core biopsy was obtained with ultrasound guidance. Two adequate core specimens were obtained. Follow-up ultrasound imaging was performed. Bandage placed over the puncture site. FINDINGS: Bilateral echogenic kidneys. Negative for hydronephrosis. Two adequate core biopsies obtained from the right kidney lower pole. No significant bleeding or hematoma formation following the core biopsies. IMPRESSION: Ultrasound-guided core biopsy of the right kidney lower pole. Electronically Signed   By: Markus Daft M.D.   On: 07/06/2018 15:24        Scheduled Meds: . enoxaparin (LOVENOX) injection  30 mg Subcutaneous Q24H  . furosemide  160 mg Oral BID  .  insulin aspart  0-15 Units Subcutaneous TID WC  . insulin aspart  0-5 Units Subcutaneous QHS  . living well with diabetes book- in spanish   Does not apply Once  . nystatin cream   Topical BID  . sodium bicarbonate  1,300 mg Oral BID   Continuous Infusions: . methylPREDNISolone (SOLU-MEDROL) injection 500 mg (07/07/18 1059)     LOS: 4 days    Time spent: 35 mins.More than 50% of that time was spent in counseling and/or coordination of care.      Shelly Coss, MD [facility] Pager [phone]  If 7PM-7AM, please contact night-coverage [email or web address] Password [email or web address] 07/07/2018, 1:51 PM

## 2018-07-07 NOTE — Consult Note (Addendum)
° °  [facility] CM Inpatient Consult   07/07/2018  Julia Johns 02-27-1950 [id number]    Referral received from Inpatient DM coordinator through Julia Johns regarding new onset DM with A1c of 7.4. Patient was also reviewed for readmission within 7 days and within 30 days; has 2 hospitalizations in the past 6 months, with 19% medium risk score for unplanned readmission, and as a benefit from her [facility] plan.  Chart review shows currently, patient does not have a primary care provider listed. Called patient in her hospital room and spoke with her briefly (HIPAA verified- gave her name and date of birth) but unable to identify her primary care provider. Permission obtained to speak with family.  Reached out and left message with transition of care RN-CM for collaboration of needs, disposition and follow-up in the community.   Call made and spoke to patient's daughter in-law Julia Schneiders C.- HIPAA verified) and discussed possible discharge needs and referral received for [facility] care management services. She voiced interest of patient having the services which could help manage her health issues after discharge. Patient lives with her son Julia Johns) and daughter in-law, who both help her manage her medications (from [facility]), provide transportation and serve as her primary caregivers at home.  Daughter in-law confirmed that patient's primary care provider is Dr. Rochel Johns with [facility] (listed as providing transition of care) whom patient had seen prior to this admission.    Medical record review shows as follows: Patient is a 68 year old female originally from [place], [name] speaking,who was recently discharged from here after work-up for shortness of breath and hemoptysis.Extensive work-up on last admission revealed 4 x 3 cm LLL mass-likeconsolidation as well as paraspinal fluid collection.  Patient underwent a bronchoscopy on 06/18/2018 and repeat  bronchoscopy on 06/25/2018 with inconclusive pathology findings but no clear evidence of malignancy. Patient was followed by her PCP as an outpatient.  She was complaining of dysuria.  UA showed hematuria and she was started on ciprofloxacin.  She was found to have new acute renal failure and was sent to the emergency department.  She also has new onset diabetes mellitus. 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 Underwent  kidney biopsy on 07/06/18. (Acute renal failure, acute respiratory failure with hypoxia, hematuria, New onset diabetes mellitus with A1C of 7.9-sliding scale insulin now, can be started on glipizide as an outpatient. Inpatient DM coordinator following).  Daughter in-law provided consent (and notify patient) for referral to [facility] community care management coordinator for complex care, disease management of new onset DM and assess for further needs. She confirmed contact information listed in Epic.  Of note, [facility] Care Management services does not replace or interfere with any services that are arranged by transition of care case management or social work.     Addendum:  Called back and spoke with patient over the hospital phone using [address] [phone]). Patient apologized for talking briefly with her yesterday because of a procedure she has to undergo. Discussed with patient regarding referral received (new onset DM) and [facility] care management services. Verbal consent for [facility] services provided by patient and appreciative of family's awareness of such. Patient expressed gratitude for the call and help provided.   For questions and additional information, please contact:  Julia Johns A. Julia Johns, BSN, RN-BC [facility] Liaison Cell: [phone]

## 2018-07-08 LAB — GLUCOSE, CAPILLARY
Glucose-Capillary: 219 mg/dL — ABNORMAL HIGH (ref 70–99)
Glucose-Capillary: 263 mg/dL — ABNORMAL HIGH (ref 70–99)
Glucose-Capillary: 253 mg/dL — ABNORMAL HIGH (ref 70–99)
Glucose-Capillary: 213 mg/dL — ABNORMAL HIGH (ref 70–99)

## 2018-07-08 LAB — RENAL FUNCTION PANEL
Albumin: 2.3 g/dL — ABNORMAL LOW (ref 3.5–5.0)
Anion gap: 17 — ABNORMAL HIGH (ref 5–15)
BUN: 119 mg/dL — ABNORMAL HIGH (ref 8–23)
CO2: 27 mmol/L (ref 22–32)
Calcium: 8.5 mg/dL — ABNORMAL LOW (ref 8.9–10.3)
Chloride: 97 mmol/L — ABNORMAL LOW (ref 98–111)
Creatinine, Ser: 3.83 mg/dL — ABNORMAL HIGH (ref 0.44–1.00)
GFR calc Af Amer: 13 mL/min — ABNORMAL LOW (ref >60)
GFR calc non Af Amer: 11 mL/min — ABNORMAL LOW (ref >60)
Glucose, Bld: 219 mg/dL — ABNORMAL HIGH (ref 70–99)
Phosphorus: 8.6 mg/dL — ABNORMAL HIGH (ref 2.5–4.6)
Potassium: 3.3 mmol/L — ABNORMAL LOW (ref 3.5–5.1)
Sodium: 141 mmol/L (ref 135–145)

## 2018-07-08 LAB — CRYOGLOBULIN

## 2018-07-08 MED ORDER — POTASSIUM CHLORIDE CRYS ER 20 MEQ PO TBCR
40.0000 meq | EXTENDED_RELEASE_TABLET | Freq: Once | ORAL | Status: AC
  Administered 2018-07-08: 40 meq via ORAL
  Filled 2018-07-08: qty 2

## 2018-07-08 MED ORDER — SODIUM CHLORIDE 0.9 % IV SOLN
375.0000 mg/m2 | Freq: Once | INTRAVENOUS | Status: AC
  Administered 2018-07-08: 700 mg via INTRAVENOUS
  Filled 2018-07-08: qty 70

## 2018-07-08 NOTE — Progress Notes (Signed)
Current PIV is leaking which required another PIV to be initiated for [name] administration. Julia Johns

## 2018-07-08 NOTE — Care Management Important Message (Signed)
Important Message  Patient Details  Name: Julia Johns MRN: [id number] Date of Birth: Dec 23, 1950   Medicare Important Message Given:  Yes    Mildred Tuccillo 07/08/2018, 4:05 PM

## 2018-07-08 NOTE — Progress Notes (Signed)
Inpatient Diabetes Program Recommendations  AACE/ADA: New Consensus Statement on Inpatient Glycemic Control (2015)  Target Ranges:  Prepandial:   less than 140 mg/dL      Peak postprandial:   less than 180 mg/dL (1-2 hours)      Critically ill patients:  140 - 180 mg/dL   Lab Results  Component Value Date   GLUCAP 263 (H) 07/08/2018   HGBA1C 7.9 (H) 07/03/2018    Review of Glycemic Control Results for ORLY, QUIMBY (MRN [id number]) as of 07/08/2018 13:08  Ref. Range 07/07/2018 21:58 07/08/2018 06:41 07/08/2018 11:45  Glucose-Capillary Latest Ref Range: 70 - 99 mg/dL 272 (H) 219 (H) 263 (H)   Diabetes history:DM 2- New diagnosis Outpatient Diabetes medications:None Current orders for Inpatient glycemic control:Novolog 0-15 units tid with meals, Novolog 0-5 units QHS  Solumedrol 500 mg daily- completed on 6/16 Prednisone 60 mg QAM Decadron 12 mg x 1 on 6/17  Inpatient Diabetes Program Recommendations:  Glucose trends increased today and noted additional dose of steroids this AM. Anticipate insulin needs to increase.  Consider adding Novolog 3 units TID (assuming patient is consuming >50% of meals and while on steroids).  Note: Insulin correction must be given within one hour of last CBG.   Thanks,  Bronson Curb, MSN, RNC-OB Diabetes Coordinator [phone] (8a-5p)

## 2018-07-08 NOTE — Progress Notes (Signed)
[name] returned to pharmacy to correct does calculations which was based off of the incorrect height. Pharmacy will contact VAS team once the medication is ready for administration. Catalina Pizza

## 2018-07-08 NOTE — Progress Notes (Signed)
Patient ID: Julia Johns, female   DOB: 01-Oct-1950, 67 y.o.   MRN: [id number] Have reviewed use, SE, and risks or Rituximab with patient.

## 2018-07-08 NOTE — Progress Notes (Signed)
Subjective: Interval History: has no complaint , spent 45 min answering questions last pm and this am with interpreter.  .  Objective: Vital signs in last 24 hours: Temp:  [97.5 F (36.4 C)-98.4 F (36.9 C)] 98.1 F (36.7 C) (06/17 0946) Pulse Rate:  [60-91] 91 (06/17 0946) Resp:  [16-18] 18 (06/17 0946) BP: (110-124)/(49-59) 119/49 (06/17 0946) SpO2:  [95 %-97 %] 97 % (06/17 0946) Weight:  [92.1 kg] 92.1 kg (06/16 2159) Weight change: 6.461 kg  Intake/Output from previous day: 06/16 0701 - 06/17 0700 In: 620 [P.O.:620] Out: 3125 [Urine:3125] Intake/Output this shift: Total I/O In: 240 [P.O.:240] Out: 750 [Urine:750]  General appearance: alert, cooperative, no distress and moderately obese Resp: clear to auscultation bilaterally Cardio: S1, S2 normal and systolic murmur: systolic ejection 2/6, crescendo and decrescendo at 2nd left intercostal space GI: obese, pos bs, soft Extremities: edema 2+  Lab Results: Recent Labs    07/06/18 0638 07/07/18 0553  WBC 17.1* 15.3*  HGB 9.3* 9.0*  HCT 28.7* 28.0*  PLT 508* 518*   BMET:  Recent Labs    07/07/18 0553 07/08/18 0237  NA 141 141  K 3.1* 3.3*  CL 101 97*  CO2 24 27  GLUCOSE 233* 219*  BUN 107* 119*  CREATININE 4.31* 3.83*  CALCIUM 8.5* 8.5*   No results for input(s): PTH in the last 72 hours. Iron Studies: No results for input(s): IRON, TIBC, TRANSFERRIN, FERRITIN in the last 72 hours.  Studies/Results: US Biopsy (kidney)  Result Date: 07/06/2018 INDICATION: 68 year old with acute kidney injury.  Request for renal biopsy. EXAM: ULTRASOUND-GUIDED RIGHT RENAL BIOPSY MEDICATIONS: None. ANESTHESIA/SEDATION: Moderate (conscious) sedation was employed during this procedure. A total of Versed 1.0 mg and Fentanyl 25 mcg was administered intravenously. Moderate Sedation Time: 16 minutes. The patient's level of consciousness and vital signs were monitored continuously by radiology nursing throughout the procedure under  my direct supervision. FLUOROSCOPY TIME:  None COMPLICATIONS: None immediate. PROCEDURE: Informed written consent was obtained from the patient with a Spanish translator after a thorough discussion of the procedural risks, benefits and alternatives. All questions were addressed. Maximal Sterile Barrier Technique was utilized including caps, mask, sterile gowns, sterile gloves, sterile drape, hand hygiene and skin antiseptic. A timeout was performed prior to the initiation of the procedure. Patient was placed prone and both kidneys were evaluated with ultrasound. The right kidney lower pole was targeted for biopsy. Right flank was prepped with chlorhexidine and sterile field was created. Skin and soft tissues were anesthetized with 1% lidocaine. Using ultrasound guidance, 16 gauge core biopsy needle was directed into the right kidney lower pole and a biopsy was obtained. Specimen placed in saline. A second core biopsy was obtained with ultrasound guidance. Two adequate core specimens were obtained. Follow-up ultrasound imaging was performed. Bandage placed over the puncture site. FINDINGS: Bilateral echogenic kidneys. Negative for hydronephrosis. Two adequate core biopsies obtained from the right kidney lower pole. No significant bleeding or hematoma formation following the core biopsies. IMPRESSION: Ultrasound-guided core biopsy of the right kidney lower pole. Electronically Signed   By: Markus Daft M.D.   On: 07/06/2018 15:24    I have reviewed the patient's current medications.  Assessment/Plan: 1 AKI ANCA vasculitis.  Diuresing, Cr better on steroids , To get Ritux, and will need q wk. 2 low K replete 3 DM control esp with steroids 4 Anemia 5 Pulm infilt P Ritux, ok to d/c in am if tol,  Will need set up for Ritux q wk.  X 3 more doses.     LOS: 5 days   Jeneen Rinks Tiyah Zelenak 07/08/2018,11:02 AM

## 2018-07-08 NOTE — Progress Notes (Signed)
PROGRESS NOTE    Julia Johns  YYT:[id number] DOB: [birth year] DOA: 07/03/2018 PCP: [name], MD  Brief Narrative: 68 year old female originally from [place], [place] speaking,who was recently discharged from here after work-up for shortness of breath and hemoptysis.Extensive work-up on last admission revealed 4 x 3 cm LLL mass-likeconsolidation as well as paraspinal fluid collection. Work-up at the time revealed negative AFB with an indeterminant  QuantiFERON gold,negative SARS-CoV-2, positivec-ANCA titers 1-80 with a sed rate of 124 and a CRP of 38.  Patient underwent a bronchoscopy on 06/18/2018 and repeat bronchoscopy on 06/25/2018 with inconclusive pathology findings but no clear evidence of malignancy. Patient was followed by her PCP as an outpatient.  She was complaining of dysuria.  UA showed hematuria and she was started on ciprofloxacin.  She was found to have new acute renal failure and was sent to the emergency department.  She also has new onset diabetes mellitus. 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 Underwent  kidney biopsy on 07/06/18.  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  Patient to get 3 more doses weekly.  Renal ultrasound consistent with  On Foley catheter.Underwent renal biopsy in 07/06/2018.  HIV, hepatitis panel negative.  Cryoglobulin level not done yet well added to the morning labs. ANA, anti-double-stranded DNA, C3-C4 normal.  ANCA proteinase 3 level elevated.  Acute respiratory failure with hypoxia: Currently on 2 L of oxygen per minute.Chest Xray had shown Low lung  volumes with mild worsening of bilateral lung opacities which may reflect edema or pneumonia.  This is most likely pulmonary edema from her acute renal failure.  Continue diuresis.  Continue to wean oxygen as tolerated.  Low suspicion for pneumonia.  Normocytic anemia: Iron studies shows low iron of 14.  She was  Given  iron infusion.  Vaginitis/perineal fungal rash:   Treated with fluconazole 200 mg once.Continue nystatin cream.  New onset diabetes mellitus: Hemoglobin A1C of 7.9.  Continue sliding scale insulin now.Can be started  on glipizide as an outpatient.  Diabetic coordinator following..  Leukocytosis: Most likely reactive.  No signs of infection, sepsis.  Antibiotics not started.  Lower extremity edema/rash: Edema significantly improved now.  Most likely associated with hypoalbuminemia.  Lung mass:Patient had a masslike consolidation with bilateral pleural effusions as per imagings on last hospitalization.Her  bronchoscopy was negative for malignancy. Earlier Chest x-ray  Had shown  possible resolution of mass. Have not ordered a chest CT  given poor renal function however a contrasted CT might be considered once her kidney function has improved.  Paravertebral fluid collection:As per CT abdomen/Pelvis done on last hospitalization. Does not complain of any back pain and she does not have any focal symptoms.  During last hospitalization, neurosurgery, neurology, CT surgery were consulted and she was not recommended any further intervention.  Deconditioning/debility: Patient seen by physical therapy and recommended home health on discharge.    DVT prophylaxis:Lovenox Code Status: Full Family Communication: Called dil  Disposition Plan: Home after full work-up   Consultants: Nephrology  Procedures:None  Antimicrobials: None            Anti-infectives (From  admission, onward)   Start     Dose/Rate Route Frequency Ordered Stop   07/03/18 1730  fluconazole  (DIFLUCAN) tablet 200 mg     200 mg Oral  Once 07/03/18 1718 07/03/18 2308       Estimated body mass index is 48.96 kg/m as calculated from the following:   Height as of this encounter: 4\' 6"  (1.372 m).   Weight as of this encounter: 92.1 kg.   Subjective: She appears in no distress had bowel movements no nausea vomiting breathing better lower extremity edema better  Objective: Vitals:   07/07/18 2159 07/08/18 0606 07/08/18 0946 07/08/18 1300  BP: (!) 110/59 (!) 124/59 (!) 119/49   Pulse: 61 64 91   Resp: 17 16 18    Temp: 98.4 F (36.9 C) (!) 97.5 F (36.4 C) 98.1 F (36.7 C)   TempSrc: Oral Oral Oral   SpO2: 96% 96% 97%   Weight: 92.1 kg     Height:    4\' 6"  (1.372 m)    Intake/Output Summary (Last 24 hours) at 07/08/2018 1327 Last data filed at 07/08/2018 1100 Gross per 24 hour  Intake 1100 ml  Output 3075 ml  Net -1975 ml   Filed Weights   07/05/18 2119 07/06/18 2024 07/07/18 2159  Weight: 88.2 kg 85.6 kg 92.1 kg    Examination:  General exam: Appears calm and comfortable  Respiratory system: Clear to auscultation. Respiratory effort normal. Cardiovascular system: S1 & S2 heard, RRR. No JVD, murmurs, rubs, gallops or clicks. No pedal edema. Gastrointestinal system: Abdomen is nondistended, soft and nontender. No organomegaly or masses felt. Normal bowel sounds heard. Central nervous system: Alert and oriented. No focal neurological deficits. Extremities: 1+ pitting edema  skin: No rashes, lesions or ulcers Psychiatry: Judgement and insight appear normal. Mood & affect appropriate.     Data Reviewed: I have personally reviewed following labs and imaging studies  CBC: Recent Labs  Lab 07/03/18 1249 07/04/18 1345 07/05/18 0709 07/06/18 0638 07/07/18 0553  WBC 17.0* 17.5* 16.0* 17.1* 15.3*  NEUTROABS  --   --   --  16.0* 14.0*  HGB 9.5* 9.1* 8.8* 9.3* 9.0*  HCT 30.1* 28.5* 28.0* 28.7* 28.0*  MCV 87.8 87.4 87.5 85.4 84.1  PLT 474* 442* 482* 508*  017*   Basic Metabolic Panel: Recent Labs  Lab 07/04/18 1345 07/05/18 0709 07/06/18 0638 07/07/18 0553 07/08/18 0237  NA 140 137 141 141 141  K 4.2 4.2 3.5 3.1* 3.3*  CL 105 103 105 101 97*  CO2 24 21* 21* 24 27  GLUCOSE 168* 205* 224* 233* 219*  BUN 57* 66* 85* 107* 119*  CREATININE 3.91* 4.27* 4.37* 4.31* 3.83*  CALCIUM 7.9* 8.1* 8.4* 8.5* 8.5*  PHOS  --   --   --  8.4* 8.6*   GFR: Estimated Creatinine Clearance: 12.4 mL/min (A) (by C-G formula based on SCr of 3.83 mg/dL (H)). Liver Function Tests: Recent Labs  Lab 07/03/18 1249 07/04/18 1345 07/07/18 0553 07/08/18 0237  AST 26 26  --   --   ALT 24 21  --   --   ALKPHOS 120 119  --   --   BILITOT 0.6 0.7  --   --   PROT 7.5 6.5  --   --   ALBUMIN 2.1* 1.8* 2.3* 2.3*   No results for input(s): LIPASE, AMYLASE in the last 168 hours. No results for input(s): AMMONIA in the last 168 hours. Coagulation Profile: Recent Labs  Lab 07/05/18  1427  INR 1.2   Cardiac Enzymes: No results for input(s): CKTOTAL, CKMB, CKMBINDEX, TROPONINI in the last 168 hours. BNP (last 3 results) No results for input(s): PROBNP in the last 8760 hours. HbA1C: No results for input(s): HGBA1C in the last 72 hours. CBG: Recent Labs  Lab 07/07/18 1118 07/07/18 1654 07/07/18 2158 07/08/18 0641 07/08/18 1145  GLUCAP 265* 149* 272* 219* 263*   Lipid Profile: No results for input(s): CHOL, HDL, LDLCALC, TRIG, CHOLHDL, LDLDIRECT in the last 72 hours. Thyroid Function Tests: No results for input(s): TSH, T4TOTAL, FREET4, T3FREE, THYROIDAB in the last 72 hours. Anemia Panel: No results for input(s): VITAMINB12, FOLATE, FERRITIN, TIBC, IRON, RETICCTPCT in the last 72 hours. Sepsis Labs: Recent Labs  Lab 07/04/18 1345  PROCALCITON 0.29    Recent Results (from the past 240 hour(s))  Urine culture     Status: None   Collection Time: 07/03/18  3:56 PM   Specimen: Urine, Clean Catch  Result Value Ref Range Status   Specimen  Description URINE, CLEAN CATCH  Final   Special Requests NONE  Final   Culture   Final    NO GROWTH Performed at [facility], [place] [address], [place], [place] [address]    Report Status 07/04/2018 FINAL  Final  Novel Coronavirus,NAA,(SEND-OUT TO REF LAB - TAT 24-48 hrs); Hosp Order     Status: None   Collection Time: 07/03/18  5:25 PM   Specimen: Nasopharyngeal Swab; Respiratory  Result Value Ref Range Status   SARS-CoV-2, NAA NOT DETECTED NOT DETECTED Final    Comment: (NOTE) This test was developed and its performance characteristics determined by [employer]. This test has not been FDA cleared or approved. This test has been authorized by FDA under an Emergency Use Authorization (EUA). This test is only authorized for the duration of time the declaration that circumstances exist justifying the authorization of the emergency use of in vitro diagnostic tests for detection of SARS-CoV-2 virus and/or diagnosis of COVID-19 infection under section 564(b)(1) of the Act, 21 U.S.C. 539JQB-3(A)(1), unless the authorization is terminated or revoked sooner. When diagnostic testing is negative, the possibility of a false negative result should be considered in the context of a patient's recent exposures and the presence of clinical signs and symptoms consistent with COVID-19. An individual without symptoms of COVID-19 and who is not shedding SARS-CoV-2 virus would expect to have a negative (not detected) result in this assay. Performed  At: [facility] [address] [place], [place] [id number] Rush Farmer MD BD:[id number]    [place]  Final    Comment: Performed at [facility], [place] [address] [place], [place] [address]         Radiology Studies: No results found.      Scheduled Meds: . enoxaparin (LOVENOX) injection  30 mg Subcutaneous Q24H  . furosemide  160 mg Oral BID  . insulin aspart  0-15 Units Subcutaneous  TID WC  . insulin aspart  0-5 Units Subcutaneous QHS  . living well with diabetes book- in spanish   Does not apply Once  . nystatin cream   Topical BID  . predniSONE  60 mg Oral Q breakfast  . riTUXimab ([name]) IV infusion  375 mg/m2 Intravenous Once  . sodium bicarbonate  1,300 mg Oral BID   Continuous Infusions:   LOS: 5 days     Georgette Shell, MD [facility]  If 7PM-7AM, please contact night-coverage [email or web address] Password [facility] 07/08/2018, 1:27  PM

## 2018-07-08 NOTE — Progress Notes (Signed)
Called pt.'s son carlos to give update about pt. Status. answered question and verbalized understanding

## 2018-07-09 ENCOUNTER — Inpatient Hospital Stay: Payer: Medicare HMO | Admitting: Primary Care

## 2018-07-09 LAB — RENAL FUNCTION PANEL
Albumin: 2.4 g/dL — ABNORMAL LOW (ref 3.5–5.0)
Anion gap: 16 — ABNORMAL HIGH (ref 5–15)
BUN: 128 mg/dL — ABNORMAL HIGH (ref 8–23)
CO2: 29 mmol/L (ref 22–32)
Calcium: 8.6 mg/dL — ABNORMAL LOW (ref 8.9–10.3)
Chloride: 96 mmol/L — ABNORMAL LOW (ref 98–111)
Creatinine, Ser: 3.27 mg/dL — ABNORMAL HIGH (ref 0.44–1.00)
GFR calc Af Amer: 16 mL/min — ABNORMAL LOW (ref >60)
GFR calc non Af Amer: 14 mL/min — ABNORMAL LOW (ref >60)
Glucose, Bld: 220 mg/dL — ABNORMAL HIGH (ref 70–99)
Phosphorus: 7.5 mg/dL — ABNORMAL HIGH (ref 2.5–4.6)
Potassium: 3.1 mmol/L — ABNORMAL LOW (ref 3.5–5.1)
Sodium: 141 mmol/L (ref 135–145)

## 2018-07-09 LAB — GLUCOSE, CAPILLARY
Glucose-Capillary: 248 mg/dL — ABNORMAL HIGH (ref 70–99)
Glucose-Capillary: 322 mg/dL — ABNORMAL HIGH (ref 70–99)
Glucose-Capillary: 251 mg/dL — ABNORMAL HIGH (ref 70–99)

## 2018-07-09 MED ORDER — BLOOD GLUCOSE METER KIT: PACK | 0 refills | Status: DC

## 2018-07-09 MED ORDER — PREDNISONE 20 MG PO TABS: 60.0000 mg | ORAL_TABLET | Freq: Every day | ORAL | 1 refills | Status: DC

## 2018-07-09 MED ORDER — POTASSIUM CHLORIDE ER 10 MEQ PO TBCR: 10.0000 meq | EXTENDED_RELEASE_TABLET | Freq: Every day | ORAL | 0 refills | Status: DC

## 2018-07-09 MED ORDER — PANTOPRAZOLE SODIUM 40 MG PO TBEC
40.0000 mg | DELAYED_RELEASE_TABLET | Freq: Every day | ORAL | Status: DC
  Administered 2018-07-09: 40 mg via ORAL
  Filled 2018-07-09: qty 1

## 2018-07-09 MED ORDER — FUROSEMIDE 80 MG PO TABS
80.0000 mg | ORAL_TABLET | Freq: Two times a day (BID) | ORAL | Status: DC
  Administered 2018-07-09: 80 mg via ORAL
  Filled 2018-07-09 (×2): qty 1

## 2018-07-09 MED ORDER — GLIPIZIDE 5 MG PO TABS: 2.5000 mg | ORAL_TABLET | Freq: Two times a day (BID) | ORAL | 2 refills | Status: DC

## 2018-07-09 MED ORDER — POTASSIUM CHLORIDE CRYS ER 20 MEQ PO TBCR
40.0000 meq | EXTENDED_RELEASE_TABLET | Freq: Two times a day (BID) | ORAL | Status: DC
  Administered 2018-07-09: 40 meq via ORAL
  Filled 2018-07-09: qty 2

## 2018-07-09 MED ORDER — FUROSEMIDE 80 MG PO TABS: 80.0000 mg | ORAL_TABLET | Freq: Two times a day (BID) | ORAL | 2 refills | Status: DC

## 2018-07-09 NOTE — Discharge Summary (Signed)
Physician Discharge Summary  Julia Johns IDH:[id number] DOB: [birth year] DOA: 07/03/2018  PCP: Julia Brome, MD  Admit date: 07/03/2018 Discharge date: 07/09/2018  Admitted From:home Disposition: home   Recommendations for Outpatient Follow-up:  1. Follow up with PCP in 1-2 weeks 2. Please obtain BMP/CBC in one week 3. Please follow up with dr deterding   Home Health:yes  Equipment/Devices:none  Discharge Condition:stable CODE STATUS:full Diet recommendation:renal carb modified  Brief/Interim Summary:68 year old femaleoriginally from [place], [place] speaking,who was recently discharged from here after work-up for shortness of breath and hemoptysis.Extensive work-upon last admissionrevealed 4 x 3 cm LLL mass-likeconsolidation as well as paraspinal fluid collection. Work-up at the time revealed negative AFB with an indeterminant QuantiFERON gold,negative SARS-CoV-2, positivec-ANCA titers 1-80 with a sed rate of 124 and a CRP of 38.  Patient underwent a bronchoscopy on 06/18/2018 and repeat bronchoscopy on 06/25/2018 with inconclusive pathology findings but no clear evidence of malignancy. Patient was followed by her PCP as an outpatient. She was complaining of dysuria. UA showed hematuria and she was started on ciprofloxacin. She was found to have new acute renal failure and was sent to the emergency department. She also has new onset diabetes mellitus. Nephrology is following her hereand she hasbeen suspected to have vasculitis causingrenal failure.Underwentkidney biopsy on 07/06/18.    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  Patient  to get 3 more doses weekly. Renal ultrasound consistent with On Foley catheter.Underwent renal biopsy in 07/06/2018. HIV, hepatitis panel negative. Cryoglobulin level not done yet well added to the morning labs.ANA, anti-double-stranded DNA, C3-C4 normal.ANCA proteinase 3 level elevated. dr deterding to arrange for weekly rituximab at short stay.  Acute respiratory failure with hypoxia:Currently on 2 L of oxygen per minute.Chest Xray had shownLow lung volumes with mild worsening of bilateral lung opacities which may reflect edema or pneumonia.This is most likely pulmonary edema from her acute renal failure. Continue diuresis.Continue to wean oxygen as tolerated. Low suspicion for pneumonia.  Normocytic anemia:Iron studies shows low iron of 14. She was Given iron infusion.  Vaginitis/perineal fungal rash:Treated with fluconazole 200 mg once.Continue nystatin cream.  New onset diabetes mellitus:Hemoglobin A1C of 7.9. will dc on glipizide 2.5 mg bid   Lower extremity edema/rash: Edema significantly improved now. Most likely associated with hypoalbuminemia.  Lung mass:Patient had a masslike consolidation with bilateral pleural effusionsas per imagings on last hospitalization.Her bronchoscopy was negative for malignancy. EarlierChest x-ray Had shownpossible resolution of mass. Have not ordered a chest CT given poor renal function however a contrasted CT might be consideredonce her kidney function has improved.  Paravertebral fluid collection:As per CT abdomen/Pelvis done on last hospitalization. Does not complain of any back pain and she does not have any focal symptoms. During last hospitalization, neurosurgery, neurology, CT surgery were consulted and she was not recommended any further intervention.  Deconditioning/debility: Patient seen by physical therapy and recommended home health on discharge   Estimated body mass index is 48.96 kg/m as calculated from  the following:   Height as of this encounter: 4' 6"  (1.372 m).   Weight as of this encounter: 92.1 kg.  Discharge Instructions  Discharge Instructions    AMB Referral to [facility]   Complete by: As directed    Patient is a 68 year old female originally from [place], [place] speaking,who was recently discharged from [facility] after work-up for shortness of breath and hemoptysis.Extensive work-up on last admission revealed 4 x 3 cm LLL mass-likeconsolidation as well as paraspinal fluid collection.  Patient underwent a bronchoscopy on 06/18/2018 and repeat bronchoscopy on 06/25/2018 with inconclusive pathology findings but no clear evidence of malignancy. Patient was followed by her PCP (Dr. Rochel Johns with [facility], listed as providing TOC) as an outpatient.  She was complaining of dysuria.  UA showed hematuria and she was started on ciprofloxacin.  She was found to have new acute renal failure and was sent to the emergency department.  She also has new onset diabetes mellitus. Nephrology is following her and she has been suspected to have vasculitis causing renal failure. Underwent  kidney biopsy on 07/06/18. (Acute renal failure, acute respiratory failure with hypoxia, hematuria, New onset diabetes mellitus with A1C of 7.9- on sliding scale insulin now, can be started on glipizide as an outpatient. Inpt.DM coordinator following).  Daughter in-law Julia Johns) confirmed contact information listed in [place].   Note: Please send consent form to obtain written consent for [facility] services.   Reason for consult: Referral to [facility] community care management coordinator for complex care, disease management of new onset DM (A1c is 7.9) and assess for further needs after discharge.   Diagnoses of: Diabetes   Expected date of contact: 1-3 days (reserved for hospital discharges)   Call MD for:  difficulty breathing, headache or visual disturbances   Complete by: As directed    Call MD for:   persistant nausea and vomiting   Complete by: As directed    Call MD for:  temperature >100.4   Complete by: As directed    Diet - low sodium heart healthy   Complete by: As directed    Face-to-face encounter (required for Medicare/Medicaid patients)   Complete by: As directed    [place] certify that this patient is under my care and that I, or a nurse practitioner or physician's assistant working with me, had a face-to-face encounter that meets the physician face-to-face encounter requirements with this patient on 07/09/2018. The encounter with the patient was in whole, or in part for the following medical condition(s) which is the primary reason for home health care (List medical condition): ARF   The encounter with the patient was in whole, or in part, for the following medical condition, which is the primary reason for home health care: ARF   I certify that, based on my findings, the following services are medically necessary home health services: Physical therapy   Reason for Medically Necessary Home Health Services: Therapy- Personnel officer, Public librarian   My clinical findings support the need for the above services: Unable to leave home safely without assistance and/or assistive device   Further, I certify that my clinical findings support that this patient is homebound due to: Unable to leave home safely without assistance   Home Health   Complete by: As directed    To provide the following care/treatments: PT   Increase activity slowly   Complete by: As directed      Allergies as of 07/09/2018      Reactions   Penicillins Anaphylaxis   Patient stated that within a few minutes of receiving a penicillin IM injection she rapidly became unconscious and required "additional" medication to recover.  This occurred when she was ~68 years old and she does not recall what was given to assist in her recovery. She did not develop a rash.       Medication List     STOP taking these medications   ciprofloxacin 250 MG tablet Commonly known as: CIPRO   methocarbamol 500 MG tablet Commonly known as: ROBAXIN   phenol 1.4 % Liqd Commonly known as: CHLORASEPTIC   potassium chloride 10 MEQ tablet Commonly known as: K-DUR     TAKE these medications   bisacodyl 10 MG suppository Commonly known as: DULCOLAX Place 1 suppository (10 mg total) rectally daily as needed for moderate constipation.   blood glucose meter kit and supplies Dispense based on patient and insurance preference. Use up to four times daily as directed. (FOR ICD-10 E10.9, E11.9).   furosemide 80 MG tablet Commonly known as: LASIX Take 1 tablet (80 mg total) by mouth 2 (two) times daily. What changed:   medication strength  how much to take  when to take this   glipiZIDE 5 MG tablet Commonly known as: Glucotrol Take 0.5 tablets (2.5 mg total) by mouth 2 (two) times daily before a meal.   predniSONE 20 MG tablet Commonly known as: DELTASONE Take 3 tablets (60 mg total) by mouth daily with breakfast. Start taking on: July 10, 2018   senna-docusate 8.6-50 MG tablet Commonly known as: Senokot-S Take 1 tablet by mouth 2 (two) times daily.            Durable Medical Equipment  (From admission, onward)         Start     Ordered   07/04/18 0855  DME Glucometer  Once     07/04/18 2683         Follow-up Information    [facility], Elnita Maxwell, MD Follow up.   Specialties: Family Medicine, Interventional Cardiology, Radiology, Anesthesiology Contact information: [address] [place] [address] [phone]        Mauricia Area, MD Follow up.   Specialty: Nephrology Contact information: [place] [address] [phone]        Rigoberto Noel, MD Follow up.   Specialty: Pulmonary Disease Contact information: [address] [place] [address] [place]  Follow up.   Why: patient to come to short stay once a week for next 3 weeks to get riruximab dr deterding arranging Contact information: [address] [id number] [place] [address] [phone]         Allergies  Allergen Reactions  . Penicillins Anaphylaxis    Patient stated that within a few minutes of receiving a penicillin IM injection she rapidly became unconscious and required "additional" medication to recover. This occurred when she was ~68 years old and she does not recall what was given to assist in her recovery. She did not develop a rash.     Consultations:  nephro   Procedures/Studies: Dg Chest 2 View  Result Date: 07/05/2018 CLINICAL DATA:  Follow-up. EXAM: CHEST - 2 VIEW COMPARISON:  07/03/2018 FINDINGS: Lung volumes remain low. The cardiomediastinal silhouette is unchanged. There is peribronchial cuffing with increased patchy opacity in the perihilar regions bilaterally. Left greater than right basilar airspace opacities have not significantly changed with masslike left basilar density on prior CT. Small pleural effusions are suspected. No pneumothorax is identified. IMPRESSION: Low lung volumes with mild worsening of bilateral  lung opacities which may reflect edema or pneumonia. Electronically Signed   By: Logan Bores M.D.   On: 07/05/2018 08:07   Ct Angio Chest Pe W Or Wo Contrast  Result Date: 06/16/2018 CLINICAL DATA:  Shortness of breath EXAM: CT ANGIOGRAPHY CHEST WITH CONTRAST TECHNIQUE: Multidetector CT imaging of the chest was performed using the standard protocol during bolus administration of intravenous contrast. Multiplanar CT image reconstructions and MIPs were obtained to evaluate the vascular anatomy. CONTRAST:  60m OMNIPAQUE IOHEXOL 350 MG/ML SOLN COMPARISON:  Jun 09, 2018 CT angiogram chest; chest CT in thoracic MRI Jun 05, 2018 FINDINGS: Cardiovascular: There is no demonstrable pulmonary embolus. There is no thoracic aortic  aneurysm or dissection. Visualized great vessels appear normal. No pericardial effusion or pericardial thickening. Mediastinum/Nodes: There remains diffuse enlargement of the left lobe of the thyroid causing deviation of the trachea in the upper thoracic region toward the right. There is a mildly enhancing mass in the left lobe of the thyroid measuring 2.8 x 2.7 cm. Multiple subcentimeter lymph nodes are evident. No frank thoracic adenopathy evident. The prevertebral soft tissue thickening extending from T6-T10 is better seen on thoracic MR and prior CT with bolus timing not targeted for arterial phase imaging. No new soft tissue lesion evident. No esophageal lesions evident. Lungs/Pleura: There are there are fairly small pleural effusions bilaterally. There is airspace consolidation in both lower lobes. There is a masslike area in the left lower lobe posteriorly measuring 4.8 x 4.3 cm which is increased in size compared to 11 days prior and likely represents either progression of pneumonia or consolidation surrounding a mas areas of consolidation in both upper lobes and lingular regions have increased. Upper abdomen: There is mild reflux of contrast into the inferior vena cava and hepatic veins. There is hepatic steatosis. Visualized upper abdominal structures otherwise appear unremarkable. Musculoskeletal: There are no blastic or lytic bone lesions. No chest wall lesions evident. IMPRESSION: 1. No demonstrable pulmonary embolus. No thoracic aortic aneurysm or dissection. 2. Fairly small right pleural effusions, slightly larger than on recent studies. Multifocal airspace consolidation, progressed. Suspect consolidation surrounding mass in left lower lobe posteriorly. 3.  Subcentimeter lymph nodes without frank adenopathy. 4. Enlarged left lobe of thyroid with dominant thyroid mass measuring 2.8 x 2.7 cm in the left lobe. It may be prudent to correlate with ultrasound given dominant thyroid mass on the left. 5. The  paraspinous lesion from T6-T10 noted on recent studies is better seen on recent thoracic MR and chest CT not bolus timed to optimize arterial vascular visualization. Neoplastic etiology in this paraspinous lesion must be of concern as noted recent thoracic MR. 6. Mild reflux of contrast into the inferior vena cava and hepatic veins may indicate a degree of increase in right heart pressure. 7.  Hepatic steatosis. Electronically Signed   By: WLowella GripIII M.D.   On: 06/16/2018 17:16   Ct Abdomen Pelvis W Contrast  Result Date: 06/14/2018 CLINICAL DATA:  Abdominal distention, nausea, vomiting, pain, colitis, paravertebral phlegmon EXAM: CT ABDOMEN AND PELVIS WITH CONTRAST TECHNIQUE: Multidetector CT imaging of the abdomen and pelvis was performed using the standard protocol following bolus administration of intravenous contrast. CONTRAST:  1055mOMNIPAQUE IOHEXOL 300 MG/ML SOLN, additional oral enteric contrast COMPARISON:  CT abdomen pelvis, 06/03/2018, CT chest, 06/05/2018, MR thoracic spine, 06/05/2018 FINDINGS: Lower chest: There are bilateral pleural effusions with associated atelectasis or consolidation and a large, masslike consolidation of the dependent left lung base measuring at least 4.9 cm,  which appears to be enlarged compared to prior CT of the chest dated 06/09/2018 (series 3, image 8). There is paravertebral fluid seen about the partially included anterior lower thoracic spine and diaphragmatic cruces (series 3, image 9). Hepatobiliary: No focal liver abnormality is seen. Hepatic steatosis. No gallstones, gallbladder wall thickening, or biliary dilatation. Pancreas: Unremarkable. No pancreatic ductal dilatation or surrounding inflammatory changes. Spleen: Normal in size without focal abnormality. Adrenals/Urinary Tract: Adrenal glands are unremarkable. Kidneys are normal, without renal calculi, focal lesion, or hydronephrosis. Bladder is unremarkable. Stomach/Bowel: Stomach is within normal  limits. Appendix appears normal. Sigmoid diverticulosis. There is mild persistent fat stranding adjacent to the proximal sigmoid colon (series 3, image 60). Vascular/Lymphatic: No significant vascular findings are present. No enlarged abdominal or pelvic lymph nodes. Reproductive: Uterine fibroids. Other: No abdominal wall hernia or abnormality. No abdominopelvic ascites. Musculoskeletal: No acute or significant osseous findings. IMPRESSION: 1. Sigmoid diverticulosis. There is mild persistent fat stranding adjacent to the proximal sigmoid colon (series 3, image 60). This may reflect persistent diverticulitis/colitis or alternately chronic sequelae. No evidence of worsening colitis or developing complication such as abscess or perforation. 2. No other CT findings of the abdomen or pelvis to explain abdominal distension, nausea, vomiting, or pain. No evidence of bowel obstruction. 3. There are bilateral pleural effusions with associated atelectasis or consolidation and a large, masslike consolidation of the dependent left lung base measuring at least 4.9 cm, which appears to be enlarged compared to prior CT of the chest dated 06/09/2018 (series 3, image 8). 4. There is paravertebral fluid seen about the partially included anterior lower thoracic spine and diaphragmatic cruces (series 3, image 9). Consider dedicated chest imaging to further evaluate interval change in these findings. Electronically Signed   By: Eddie Candle M.D.   On: 06/14/2018 12:01   US Renal  Result Date: 07/03/2018 CLINICAL DATA:  Acute renal failure. EXAM: RENAL / URINARY TRACT ULTRASOUND COMPLETE COMPARISON:  None. FINDINGS: Right Kidney: Renal measurements: 13.1 x 5.5 x 4.6 cm = volume: 172 mL . Echogenicity within normal limits. No mass or hydronephrosis visualized. Left Kidney: Renal measurements: 12.9 x 7.8 x 6.0 cm = volume: 316 mL. Borderline increased parenchymal echogenicity. No mass or stone. No hydronephrosis Bladder: Appears  normal for degree of bladder distention. IMPRESSION: 1. Borderline increased renal parenchymal echogenicity on the left suggesting medical renal disease. The left kidney is larger than the right. No renal masses, stones or hydronephrosis. Electronically Signed   By: Lajean Manes M.D.   On: 07/03/2018 17:52   Dg Chest Portable 1 View  Result Date: 07/03/2018 CLINICAL DATA:  Pts family states pt was discharged 1 wk ago (admitted for stomach mass and PNA). States the pt has been having dysuria x 2-3 days, also having bilateral leg swelling. Pt was told by doctor that labs yesterday reflected new onset DM and acute renal failure - sent by doc to ED for eval EXAM: PORTABLE CHEST 1 VIEW COMPARISON:  Chest CT, 06/16/2018. Prior chest radiographs, most recent dated 06/05/2018. FINDINGS: Lung volumes are low. There is opacity at both lung bases consistent with a combination of small pleural effusions and atelectasis. Pneumonia at either lung base is possible. There are prominent bronchovascular markings bilaterally, but no overt pulmonary edema. Cardiac silhouette is normal in size. No mediastinal or hilar masses. Skeletal structures are grossly intact. IMPRESSION: 1. Small effusions with bilateral lung base opacities, which are most likely due to atelectasis. Consider pneumonia if there are consistent clinical findings. Prominent  bronchovascular markings accentuated by the low lung volumes, without overt pulmonary edema. Electronically Signed   By: Lajean Manes M.D.   On: 07/03/2018 15:00   Dg Abd Portable 1v  Result Date: 06/11/2018 CLINICAL DATA:  Abdominal pain and distension. EXAM: PORTABLE ABDOMEN - 1 VIEW COMPARISON:  CT abdomen and pelvis 06/03/2018 FINDINGS: Gas is present in scattered nondilated loops of bowel without evidence of obstruction. No gross intraperitoneal free air is identified on this supine study. Thoracolumbar spondylosis is noted. The lung bases are not well evaluated due to motion  artifact. IMPRESSION: Nonobstructed bowel gas pattern. Electronically Signed   By: Logan Bores M.D.   On: 06/11/2018 11:38   Dg Swallowing Func-speech Pathology  Result Date: 06/24/2018 Objective Swallowing Evaluation: Type of Study: Bedside Swallow Evaluation  Patient Details Name: Elizeth Weinrich MRN: [id number] Date of Birth: July 21, 1950 Today's Date: 06/24/2018 Time: SLP Start Time (ACUTE ONLY): 9528 -SLP Stop Time (ACUTE ONLY): 1410 SLP Time Calculation (min) (ACUTE ONLY): 25 min Past Medical History: No past medical history on file. Past Surgical History: Past Surgical History: Procedure Laterality Date . VIDEO BRONCHOSCOPY Bilateral 06/18/2018  Procedure: VIDEO BRONCHOSCOPY WITH FLUORO;  Surgeon: Rigoberto Noel, MD;  Location: [facility];  Service: Cardiopulmonary;  Laterality: Bilateral; HPI: This is a 68 year old female who presented as a transfer from [facility] with complaints of shortness of breath and blood-tinged sputum.  Patient underwent CT imaging which revealed a paraspinal soft tissue thickening from T6-T10 as well as a left lower lobe density within the CT which was enlarged from prior imaging concerning for an inflammatory/infectious etiology. Bronchoscopy 5/28. No culture growth. Cytology with atypical cells. No malignancy identified. TB- AFBs neg x 2, Quantiferon TB indeterminate, Covid negative. MD ordered MBS for objective assessment of swallow function.  No data recorded Assessment / Plan / Recommendation CHL IP CLINICAL IMPRESSIONS 06/24/2018 Clinical Impression Pt demonstrates normal swallow function. She could not swallow a barium tablet, could not orally transit despite liquid and puree boluses given, said it was too large. No SLP f/u needed. Continue current diet.  SLP Visit Diagnosis Dysphagia, unspecified (R13.10) Attention and concentration deficit following -- Frontal lobe and executive function deficit following -- Impact on safety and function Mild aspiration risk    CHL IP TREATMENT RECOMMENDATION 06/24/2018 Treatment Recommendations No treatment recommended at this time   No flowsheet data found. CHL IP DIET RECOMMENDATION 06/24/2018 SLP Diet Recommendations Regular solids;Thin liquid Liquid Administration via Cup;Straw Medication Administration Whole meds with liquid Compensations -- Postural Changes --   No flowsheet data found.  No flowsheet data found.  No flowsheet data found.     CHL IP ORAL PHASE 06/24/2018 Oral Phase WFL Oral - Pudding Teaspoon -- Oral - Pudding Cup -- Oral - Honey Teaspoon -- Oral - Honey Cup -- Oral - Nectar Teaspoon -- Oral - Nectar Cup -- Oral - Nectar Straw -- Oral - Thin Teaspoon -- Oral - Thin Cup -- Oral - Thin Straw -- Oral - Puree -- Oral - Mech Soft -- Oral - Regular -- Oral - Multi-Consistency -- Oral - Pill -- Oral Phase - Comment --  CHL IP PHARYNGEAL PHASE 06/24/2018 Pharyngeal Phase WFL Pharyngeal- Pudding Teaspoon -- Pharyngeal -- Pharyngeal- Pudding Cup -- Pharyngeal -- Pharyngeal- Honey Teaspoon -- Pharyngeal -- Pharyngeal- Honey Cup -- Pharyngeal -- Pharyngeal- Nectar Teaspoon -- Pharyngeal -- Pharyngeal- Nectar Cup -- Pharyngeal -- Pharyngeal- Nectar Straw -- Pharyngeal -- Pharyngeal- Thin Teaspoon -- Pharyngeal -- Pharyngeal- Thin Cup -- Pharyngeal --  Pharyngeal- Thin Straw -- Pharyngeal -- Pharyngeal- Puree -- Pharyngeal -- Pharyngeal- Mechanical Soft -- Pharyngeal -- Pharyngeal- Regular -- Pharyngeal -- Pharyngeal- Multi-consistency -- Pharyngeal -- Pharyngeal- Pill -- Pharyngeal -- Pharyngeal Comment --  No flowsheet data found. Herbie Baltimore, MA CCC-SLP Acute Rehabilitation Services Pager [phone] Office [phone] Lynann Beaver 06/24/2018, 2:48 PM              Dg C-arm 1-60 Min-no Report  Result Date: 06/18/2018 Fluoroscopy was utilized by the requesting physician.  No radiographic interpretation.   US Biopsy (kidney)  Result Date: 07/06/2018 INDICATION: 53 year old with acute kidney injury.  Request for renal  biopsy. EXAM: ULTRASOUND-GUIDED RIGHT RENAL BIOPSY MEDICATIONS: None. ANESTHESIA/SEDATION: Moderate (conscious) sedation was employed during this procedure. A total of Versed 1.0 mg and Fentanyl 25 mcg was administered intravenously. Moderate Sedation Time: 16 minutes. The patient's level of consciousness and vital signs were monitored continuously by radiology nursing throughout the procedure under my direct supervision. FLUOROSCOPY TIME:  None COMPLICATIONS: None immediate. PROCEDURE: Informed written consent was obtained from the patient with a Spanish translator after a thorough discussion of the procedural risks, benefits and alternatives. All questions were addressed. Maximal Sterile Barrier Technique was utilized including caps, mask, sterile gowns, sterile gloves, sterile drape, hand hygiene and skin antiseptic. A timeout was performed prior to the initiation of the procedure. Patient was placed prone and both kidneys were evaluated with ultrasound. The right kidney lower pole was targeted for biopsy. Right flank was prepped with chlorhexidine and sterile field was created. Skin and soft tissues were anesthetized with 1% lidocaine. Using ultrasound guidance, 16 gauge core biopsy needle was directed into the right kidney lower pole and a biopsy was obtained. Specimen placed in saline. A second core biopsy was obtained with ultrasound guidance. Two adequate core specimens were obtained. Follow-up ultrasound imaging was performed. Bandage placed over the puncture site. FINDINGS: Bilateral echogenic kidneys. Negative for hydronephrosis. Two adequate core biopsies obtained from the right kidney lower pole. No significant bleeding or hematoma formation following the core biopsies. IMPRESSION: Ultrasound-guided core biopsy of the right kidney lower pole. Electronically Signed   By: Markus Daft M.D.   On: 07/06/2018 15:24   Vas [place] Lower Extremity Venous (dvt)  Result Date: 06/17/2018  Lower Venous Study  Indications: Edema.  Performing Technologist: Abram Sander RVS  Examination Guidelines: A complete evaluation includes B-mode imaging, spectral Doppler, color Doppler, and power Doppler as needed of all accessible portions of each vessel. Bilateral testing is considered an integral part of a complete examination. Limited examinations for reoccurring indications may be performed as noted.  +---------+---------------+---------+-----------+----------+--------------+ RIGHT    CompressibilityPhasicitySpontaneityPropertiesSummary        +---------+---------------+---------+-----------+----------+--------------+ CFV      Full           Yes      Yes                                 +---------+---------------+---------+-----------+----------+--------------+ SFJ      Full                                                        +---------+---------------+---------+-----------+----------+--------------+ FV Prox  Full                                                        +---------+---------------+---------+-----------+----------+--------------+  FV Mid   Full                                                        +---------+---------------+---------+-----------+----------+--------------+ FV DistalFull                                                        +---------+---------------+---------+-----------+----------+--------------+ PFV      Full                                                        +---------+---------------+---------+-----------+----------+--------------+ POP      Full           Yes      Yes                                 +---------+---------------+---------+-----------+----------+--------------+ PTV      Full                                                        +---------+---------------+---------+-----------+----------+--------------+ PERO                                                  Not visualized  +---------+---------------+---------+-----------+----------+--------------+   +---------+---------------+---------+-----------+----------+--------------+ LEFT     CompressibilityPhasicitySpontaneityPropertiesSummary        +---------+---------------+---------+-----------+----------+--------------+ CFV      Full           Yes      Yes                                 +---------+---------------+---------+-----------+----------+--------------+ SFJ      Full                                                        +---------+---------------+---------+-----------+----------+--------------+ FV Prox  Full                                                        +---------+---------------+---------+-----------+----------+--------------+ FV Mid   Full                                                        +---------+---------------+---------+-----------+----------+--------------+  FV DistalFull                                                        +---------+---------------+---------+-----------+----------+--------------+ PFV      Full                                                        +---------+---------------+---------+-----------+----------+--------------+ POP      Full           Yes      Yes                                 +---------+---------------+---------+-----------+----------+--------------+ PTV      Full                                                        +---------+---------------+---------+-----------+----------+--------------+ PERO                                                  Not visualized +---------+---------------+---------+-----------+----------+--------------+     Summary: Right: There is no evidence of deep vein thrombosis in the lower extremity. No cystic structure found in the popliteal fossa. Left: There is no evidence of deep vein thrombosis in the lower extremity. No cystic structure found in the popliteal fossa.  *See  table(s) above for measurements and observations. Electronically signed by Harold Barban MD on 06/17/2018 at 1:08:38 PM.    Final     (Echo, Carotid, EGD, Colonoscopy, ERCP)    Subjective: Wants jello and ensure had interpreter on..dw son  Discharge Exam: Vitals:   07/09/18 0511 07/09/18 0900  BP: (!) 104/55 (!) 112/59  Pulse: 63 69  Resp: 20 18  Temp: 98 F (36.7 C) 98 F (36.7 C)  SpO2: 92% 95%   Vitals:   07/08/18 2110 07/09/18 0500 07/09/18 0511 07/09/18 0900  BP: (!) 98/50  (!) 104/55 (!) 112/59  Pulse: 62  63 69  Resp: 20  20 18   Temp: 98.3 F (36.8 C)  98 F (36.7 C) 98 F (36.7 C)  TempSrc:   Oral Oral  SpO2: 100%  92% 95%  Weight: 92.1 kg 92.1 kg    Height:        General: Pt is alert, awake, not in acute distress Cardiovascular: RRR, S1/S2 +, no rubs, no gallops Respiratory: CTA bilaterally, no wheezing, no rhonchi Abdominal: Soft, NT, ND, bowel sounds + Extremities: trace edema   The results of significant diagnostics from this hospitalization (including imaging, microbiology, ancillary and laboratory) are listed below for reference.     Microbiology: Recent Results (from the past 240 hour(s))  Urine culture     Status: None   Collection Time: 07/03/18  3:56 PM   Specimen: Urine, Clean Catch  Result Value Ref Range Status  Specimen Description URINE, CLEAN CATCH  Final   Special Requests NONE  Final   Culture   Final    NO GROWTH Performed at [facility], [place] [address] [place], [place] [address]    Report Status 07/04/2018 FINAL  Final  Novel Coronavirus,NAA,(SEND-OUT TO REF LAB - TAT 24-48 hrs); Hosp Order     Status: None   Collection Time: 07/03/18  5:25 PM   Specimen: Nasopharyngeal Swab; Respiratory  Result Value Ref Range Status   SARS-CoV-2, NAA NOT DETECTED NOT DETECTED Final    Comment: (NOTE) This test was developed and its performance characteristics determined by [employer]. This test has not been FDA cleared  or approved. This test has been authorized by FDA under an Emergency Use Authorization (EUA). This test is only authorized for the duration of time the declaration that circumstances exist justifying the authorization of the emergency use of in vitro diagnostic tests for detection of SARS-CoV-2 virus and/or diagnosis of COVID-19 infection under section 564(b)(1) of the Act, 21 U.S.C. 875IEP-3(I)(9), unless the authorization is terminated or revoked sooner. When diagnostic testing is negative, the possibility of a false negative result should be considered in the context of a patient's recent exposures and the presence of clinical signs and symptoms consistent with COVID-19. An individual without symptoms of COVID-19 and who is not shedding SARS-CoV-2 virus would expect to have a negative (not detected) result in this assay. Performed  At: [facility] [address] [place], [place] [id number] Rush Farmer MD YT:[id number]    [place]  Final    Comment: Performed at [facility], [place] [address], [place], [place] [address]     Labs: BNP (last 3 results) No results for input(s): BNP in the last 8760 hours. Basic Metabolic Panel: Recent Labs  Lab 07/05/18 0709 07/06/18 0638 07/07/18 0553 07/08/18 0237 07/09/18 0526  NA 137 141 141 141 141  K 4.2 3.5 3.1* 3.3* 3.1*  CL 103 105 101 97* 96*  CO2 21* 21* 24 27 29   GLUCOSE 205* 224* 233* 219* 220*  BUN 66* 85* 107* 119* 128*  CREATININE 4.27* 4.37* 4.31* 3.83* 3.27*  CALCIUM 8.1* 8.4* 8.5* 8.5* 8.6*  PHOS  --   --  8.4* 8.6* 7.5*   Liver Function Tests: Recent Labs  Lab 07/03/18 1249 07/04/18 1345 07/07/18 0553 07/08/18 0237 07/09/18 0526  AST 26 26  --   --   --   ALT 24 21  --   --   --   ALKPHOS 120 119  --   --   --   BILITOT 0.6 0.7  --   --   --   PROT 7.5 6.5  --   --   --   ALBUMIN 2.1* 1.8* 2.3* 2.3* 2.4*   No results for input(s): LIPASE, AMYLASE in the last 168  hours. No results for input(s): AMMONIA in the last 168 hours. CBC: Recent Labs  Lab 07/03/18 1249 07/04/18 1345 07/05/18 0709 07/06/18 0638 07/07/18 0553  WBC 17.0* 17.5* 16.0* 17.1* 15.3*  NEUTROABS  --   --   --  16.0* 14.0*  HGB 9.5* 9.1* 8.8* 9.3* 9.0*  HCT 30.1* 28.5* 28.0* 28.7* 28.0*  MCV 87.8 87.4 87.5 85.4 84.1  PLT 474* 442* 482* 508* 518*   Cardiac Enzymes: No results for input(s): CKTOTAL, CKMB, CKMBINDEX, TROPONINI in the last 168 hours. BNP: Invalid input(s): POCBNP CBG: Recent Labs  Lab 07/08/18 1145 07/08/18 1620 07/08/18  2109 07/09/18 0702 07/09/18 1124  GLUCAP 263* 253* 213* 248* 322*   D-Dimer No results for input(s): DDIMER in the last 72 hours. Hgb A1c No results for input(s): HGBA1C in the last 72 hours. Lipid Profile No results for input(s): CHOL, HDL, LDLCALC, TRIG, CHOLHDL, LDLDIRECT in the last 72 hours. Thyroid function studies No results for input(s): TSH, T4TOTAL, T3FREE, THYROIDAB in the last 72 hours.  Invalid input(s): FREET3 Anemia work up No results for input(s): VITAMINB12, FOLATE, FERRITIN, TIBC, IRON, RETICCTPCT in the last 72 hours. Urinalysis    Component Value Date/Time   COLORURINE YELLOW 07/03/2018 [place] 07/03/2018 1604   LABSPEC 1.009 07/03/2018 1604   PHURINE 8.0 07/03/2018 1604   GLUCOSEU NEGATIVE 07/03/2018 1604   HGBUR LARGE (A) 07/03/2018 1604   BILIRUBINUR NEGATIVE 07/03/2018 1604   KETONESUR NEGATIVE 07/03/2018 1604   PROTEINUR 100 (A) 07/03/2018 1604   NITRITE NEGATIVE 07/03/2018 1604   LEUKOCYTESUR NEGATIVE 07/03/2018 1604   Sepsis Labs Invalid input(s): PROCALCITONIN,  WBC,  LACTICIDVEN Microbiology Recent Results (from the past 240 hour(s))  Urine culture     Status: None   Collection Time: 07/03/18  3:56 PM   Specimen: Urine, Clean Catch  Result Value Ref Range Status   Specimen Description URINE, CLEAN CATCH  Final   Special Requests NONE  Final   Culture   Final    NO  GROWTH Performed at [facility], [place] [address] [place], [place] [address]    Report Status 07/04/2018 FINAL  Final  Novel Coronavirus,NAA,(SEND-OUT TO REF LAB - TAT 24-48 hrs); Hosp Order     Status: None   Collection Time: 07/03/18  5:25 PM   Specimen: Nasopharyngeal Swab; Respiratory  Result Value Ref Range Status   SARS-CoV-2, NAA NOT DETECTED NOT DETECTED Final    Comment: (NOTE) This test was developed and its performance characteristics determined by [employer]. This test has not been FDA cleared or approved. This test has been authorized by FDA under an Emergency Use Authorization (EUA). This test is only authorized for the duration of time the declaration that circumstances exist justifying the authorization of the emergency use of in vitro diagnostic tests for detection of SARS-CoV-2 virus and/or diagnosis of COVID-19 infection under section 564(b)(1) of the Act, 21 U.S.C. 518FQM-2(J)(0), unless the authorization is terminated or revoked sooner. When diagnostic testing is negative, the possibility of a false negative result should be considered in the context of a patient's recent exposures and the presence of clinical signs and symptoms consistent with COVID-19. An individual without symptoms of COVID-19 and who is not shedding SARS-CoV-2 virus would expect to have a negative (not detected) result in this assay. Performed  At: [facility] [address] [place], [place] [id number] Rush Farmer MD LR:[id number]    [place]  Final    Comment: Performed at [facility], [place] [address] [place], [place] [address]     Time coordinating discharge:  35 minutes  SIGNED:   Georgette Shell, MD  [facility] 07/09/2018, 11:53 AM Pager   If 7PM-7AM, please contact night-coverage [email or web address] Password [email or web address]

## 2018-07-09 NOTE — Plan of Care (Signed)
  Problem: Skin Integrity: Goal: Risk for impaired skin integrity will decrease Outcome: Progressing   

## 2018-07-09 NOTE — Progress Notes (Signed)
Physical Therapy Treatment Patient Details Name: Julia Johns MRN: [id number] DOB: [birth year] Today's Date: 07/09/2018    History of Present Illness 67 y.o. female who was recently discharged from [facility] 06/26/2018 for work-up of shortness of breath and hemoptysis.  Extensive work-up in house revealed 4 x 3 cm LLL mass-like consolidation as well as paraspinal fluid collection.  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  Patient underwent a bronchoscopy on 5/28 and repeat bronchoscopy on 6/4 with inconclusive pathology findings but no clear evidence of malignancy. Patient was seen for routine posthospitalization follow-up at [facility] 07/03/18.  At that time she complained of dysuria.  UA showed hematuria and patient was started on ciprofloxacin.  Patient was called by [facility] 07/03/18 telling her to come to the ED because she was in acute renal failure.    PT Comments    Patient doing well with therapy ambulating with RW and no external assistance. She describes her legs as weak and near giving out while walking, denies pain. Agree with HHPT.     Follow Up Recommendations  Home health PT     Equipment Recommendations  None recommended by PT    Recommendations for Other Services       Precautions / Restrictions Precautions Precautions: Fall Precaution Comments: low fall risk but is unsteady standing Restrictions Weight Bearing Restrictions: No    Mobility  Bed Mobility Overal bed mobility: Needs Assistance Bed Mobility: Supine to Sit     Supine to sit: Supervision;HOB elevated     General bed mobility comments: up in chair when PT arrived with CNA  Transfers Overall transfer level: Needs assistance Equipment used: 1 person hand held assist Transfers: Sit to/from Stand Sit to Stand: Min guard         General  transfer comment: using a hand hold on bed rail to stand up at chair  Ambulation/Gait Ambulation/Gait assistance: Supervision Gait Distance (Feet): 50 Feet Assistive device: Rolling walker (2 wheeled) Gait Pattern/deviations: Step-through pattern;Decreased stride length Gait velocity: decreased   General Gait Details: declined today   Marine scientist [place] (Stroke Patients Only)       Balance Overall balance assessment: Needs assistance Sitting-balance support: No upper extremity supported;Feet supported Sitting balance-Leahy Scale: Good     Standing balance support: Single extremity supported Standing balance-Leahy Scale: Poor                              Cognition Arousal/Alertness: Awake/alert Behavior During Therapy: WFL for tasks assessed/performed Overall Cognitive Status: Within Functional Limits for tasks assessed                                        Exercises      General Comments        Pertinent Vitals/Pain Faces Pain Scale: Hurts even more Pain Location: L upper abdominal pain Pain Descriptors / Indicators: Grimacing    Home Living                      Prior Function  PT Goals (current goals can now be found in the care plan section) Acute Rehab PT Goals Patient Stated Goal: to feel better PT Goal Formulation: With patient Time For Goal Achievement: 07/19/18 Potential to Achieve Goals: Good Progress towards PT goals: Progressing toward goals    Frequency    Min 3X/week      PT Plan Current plan remains appropriate;Equipment recommendations need to be updated    Co-evaluation              AM-PAC PT "6 Clicks" Mobility   Outcome Measure  Help needed turning from your back to your side while in a flat bed without using bedrails?: None Help needed moving from lying on your back to sitting on the side of a flat bed without using  bedrails?: None Help needed moving to and from a bed to a chair (including a wheelchair)?: A Little Help needed standing up from a chair using your arms (e.g., wheelchair or bedside chair)?: A Little Help needed to walk in hospital room?: A Little Help needed climbing 3-5 steps with a railing? : A Lot 6 Click Score: 19    End of Session Equipment Utilized During Treatment: Other (comment)(Spanish translator) Activity Tolerance: Patient tolerated treatment well;Patient limited by fatigue Patient left: in chair;with call bell/phone within reach Nurse Communication: Mobility status PT Visit Diagnosis: Muscle weakness (generalized) (M62.81);Unsteadiness on feet (R26.81)     Time: 9935-7017 PT Time Calculation (min) (ACUTE ONLY): 15 min  Charges:  $Gait Training: 8-22 mins                     Reinaldo Berber, PT, DPT Acute Rehabilitation Services Pager: [phone] Office: [phone]     Reinaldo Berber 07/09/2018, 12:39 PM

## 2018-07-09 NOTE — TOC Benefit Eligibility Note (Signed)
Transition of Care [facility]) Benefit Eligibility Note    Patient Details  Name: Maddison Kilner MRN: [id number] Date of Birth: 25-Jul-1950      Covered?: (no information)        Spoke with Person/Company/Phone Number:: Jarrett Soho           Additional Notes: [employer] has no information on rituxamab infusion  Have made two calls to the CM for more information left a message with no response  Orbie Pyo Phone Number: 07/09/2018, 12:25 PM

## 2018-07-09 NOTE — Progress Notes (Signed)
Called son [name] about the updates and pending discharge .[name] said ,he will get her at around 5 pm today.

## 2018-07-09 NOTE — TOC Transition Note (Addendum)
Transition of Care [facility]) - CM/SW Discharge Note   Patient Details  Name: Julia Johns MRN: [id number] Date of Birth: 1950-09-08  Transition of Care [facility]) CM/SW Contact:  Sharin Mons, RN Phone Number: 07/09/2018, 4:18 PM   Clinical Narrative:  Admitted with ARF/ vasculitis/ new DM. Pt will transition to home with son today with the resumption of home health services with [facility]. Dr.Deterdene to arrange f/u appointments with short stay for weekly RITOXUMAB. Son to provide transportation to home.  [name] (Daughter) [name] (Son)    [phone]     PCP: [name]  Final next level of care: Home w Home Health Services Barriers to Discharge: No Barriers Identified   Patient Goals and CMS Choice        Discharge Placement                       Discharge Plan and Services                          HH Arranged: RN, PT [facility] Agency: [facility] Date [facility] Agency Contacted: 07/09/18([facility] liaison  made aware of d/c plan via voice message) Time [place]: 1616    Social Determinants of Health (SDOH) Interventions     Readmission Risk Interventions No flowsheet data found.

## 2018-07-09 NOTE — Progress Notes (Signed)
Subjective: Interval History: has complaints quesy stom. .  Objective: Vital signs in last 24 hours: Temp:  [97.3 F (36.3 C)-99 F (37.2 C)] 98 F (36.7 C) (06/18 0900) Pulse Rate:  [59-69] 69 (06/18 0900) Resp:  [18-20] 18 (06/18 0900) BP: (98-118)/(50-60) 112/59 (06/18 0900) SpO2:  [92 %-100 %] 95 % (06/18 0900) Weight:  [92.1 kg] 92.1 kg (06/18 0500) Weight change: 0.015 kg  Intake/Output from previous day: 06/17 0701 - 06/18 0700 In: 930 [P.O.:930] Out: 2675 [Urine:2675] Intake/Output this shift: No intake/output data recorded.  General appearance: alert, cooperative, no distress and mildly obese Resp: clear to auscultation bilaterally Cardio: S1, S2 normal GI: pos bs, soft, , nontender. Extremities: edema 2+  Lab Results: Recent Labs    07/07/18 0553  WBC 15.3*  HGB 9.0*  HCT 28.0*  PLT 518*   BMET:  Recent Labs    07/08/18 0237 07/09/18 0526  NA 141 141  K 3.3* 3.1*  CL 97* 96*  CO2 27 29  GLUCOSE 219* 220*  BUN 119* 128*  CREATININE 3.83* 3.27*  CALCIUM 8.5* 8.6*   No results for input(s): PTH in the last 72 hours. Iron Studies: No results for input(s): IRON, TIBC, TRANSFERRIN, FERRITIN in the last 72 hours.  Studies/Results: No results found.  I have reviewed the patient's current medications.  Assessment/Plan: 1 AKI diuresing, can lower Lasix, needs k.  Received Ritux, and on Pred . Cr improving,. Needs set up for weekly Rituximab. 2 Anemia. 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

## 2018-07-09 NOTE — Discharge Summary (Signed)
Physician Discharge Summary  Julia Johns WER:[id number] DOB: 02-01-1950 DOA: 07/03/2018  PCP: Julia Brome, MD  Admit date: 07/03/2018 Discharge date: 07/10/2018  Admitted From: Home Disposition: Home  Recommendations for Outpatient Follow-up:  1. Follow up with PCP in 1-2 weeks 2. Please obtain BMP/CBC in one week 3. 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  Patient due to get 3 more doses weekly.  Renal ultrasound consistent with medical renal disease. Underwent renal biopsy in 07/06/2018.  HIV, hepatitis panel negative.  Cryoglobulin level pending.  ANA, anti-double-stranded DNA, C3-C4 normal.  ANCA proteinase 3 level elevated.  Acute respiratory failure with hypoxia: Currently on 2 L of oxygen per minute.Chest Xray had shown Low lung volumes with mild worsening of bilateral lung opacities which may reflect edema or pneumonia.    More likely pulmonary edema from acute renal failure.  Continue diuresis at home Lasix twice a day.  Patient was discharged home on 2 L of oxygen during her last hospital stay.    Normocytic anemia: Iron studies shows low iron of 14.  She was  Given  iron infusion.  Vaginitis/perineal fungal rash:   Treated with fluconazole 200 mg once.Continue nystatin cream.  New onset diabetes mellitus: Hemoglobin A1C of 7.9.  She was treated with sliding scale insulin.  She will be discharged on glipizide 2.5 mg twice a  day.  Glucometer ordered upon discharge.   Lower extremity edema/rash: Edema significantly improved now.  Most likely associated with hypoalbuminemia.  Lung mass:Patient had a masslike consolidation with bilateral pleural effusions as per imagings on last hospitalization.Her  bronchoscopy was negative for malignancy. Earlier Chest x-ray  Had shown  possible resolution of mass.  Follow-up with Dr. [name].  Paravertebral fluid collection:As per CT abdomen/Pelvis done on last hospitalization. Does not complain of any back pain and she does not have any focal symptoms.  During last hospitalization, neurosurgery, neurology, CT surgery were consulted and she was not recommended any further intervention.  Deconditioning/debility: Patient seen by physical therapy and recommended home health on discharge.   Estimated body mass index is 48.96 kg/m as calculated from the following:   Height as of this encounter: 4' 6"  (1.372 m).   Weight as of this encounter: 92.1 kg.  Discharge Instructions  Discharge Instructions    AMB Referral to Veteran Management   Complete by: As directed    Patient is a 68 year old female originally from [place], [place] speaking,who was recently discharged from [facility] after work-up for shortness of breath and hemoptysis.Extensive work-up on last admission revealed 4 x 3 cm LLL mass-likeconsolidation as well as paraspinal fluid collection.  Patient underwent a bronchoscopy on 06/18/2018 and repeat bronchoscopy on 06/25/2018 with inconclusive pathology findings but no clear evidence of malignancy. Patient was followed by her PCP (Dr. Rochel Johns with [facility], listed as providing TOC) as an outpatient.  She was complaining of dysuria.  UA showed hematuria and she was started on ciprofloxacin.  She was found to have new  acute renal failure and was sent to the emergency department.  She also has new onset diabetes mellitus. Nephrology is following her and she has  been suspected to have vasculitis causing renal failure. Underwent  kidney biopsy on 07/06/18. (Acute renal failure, acute respiratory failure with hypoxia, hematuria, New onset diabetes mellitus with A1C of 7.9- on sliding scale insulin now, can be started on glipizide as an outpatient. Inpt.DM coordinator following).  Daughter in-law [name]) confirmed contact information listed in [place].   Note: Please send consent form to obtain written consent for [facility] services.   Reason for consult: Referral to [facility] community care management coordinator for complex care, disease management of new onset DM (A1c is 7.9) and assess for further needs after discharge.   Diagnoses of: Diabetes   Expected date of contact: 1-3 days (reserved for hospital discharges)   Call MD for:  difficulty breathing, headache or visual disturbances   Complete by: As directed    Call MD for:  persistant nausea and vomiting   Complete by: As directed    Call MD for:  temperature >100.4   Complete by: As directed    Diet - low sodium heart healthy   Complete by: As directed    Face-to-face encounter (required for Medicare/Medicaid patients)   Complete by: As directed    [facility] certify that this patient is under my care and that I, or a nurse practitioner or physician's assistant working with me, had a face-to-face encounter that meets the physician face-to-face encounter requirements with this patient on 07/09/2018. The encounter with the patient was in whole, or in part for the following medical condition(s) which is the primary reason for home health care (List medical condition): ARF   The encounter with the patient was in whole, or in part, for the following medical condition, which is the primary reason for home health care: ARF   I certify that, based on my findings, the following services are medically necessary home health services: Physical therapy   Reason for Medically Necessary Home Health Services:  Therapy- Personnel officer, Public librarian   My clinical findings support the need for the above services: Unable to leave home safely without assistance and/or assistive device   Further, I certify that my clinical findings support that this patient is homebound due to: Unable to leave home safely without assistance   Home Health   Complete by: As directed    To provide the following care/treatments: PT   Increase activity slowly   Complete by: As directed      Allergies as of 07/09/2018      Reactions   Penicillins Anaphylaxis   Patient stated that within a few minutes of receiving a penicillin IM injection she rapidly became unconscious and required "additional" medication to recover. This occurred when she was ~68 years old and she does not recall what was given to assist in her recovery. She did not develop a rash.       Medication List    STOP taking these medications   ciprofloxacin 250 MG tablet Commonly known as: CIPRO   methocarbamol 500 MG tablet Commonly known as: ROBAXIN   phenol 1.4 % Liqd Commonly known as: CHLORASEPTIC     TAKE these medications   bisacodyl 10 MG suppository Commonly known as: DULCOLAX Place 1 suppository (10 mg total) rectally daily as needed for moderate constipation.   blood glucose meter kit and supplies Dispense based on patient and insurance  preference. Use up to four times daily as directed. (FOR ICD-10 E10.9, E11.9).   furosemide 80 MG tablet Commonly known as: LASIX Take 1 tablet (80 mg total) by mouth 2 (two) times daily. What changed:   medication strength  how much to take  when to take this   glipiZIDE 5 MG tablet Commonly known as: Glucotrol Take 0.5 tablets (2.5 mg total) by mouth 2 (two) times daily before a meal.   potassium chloride 10 MEQ tablet Commonly known as: K-DUR Take 1 tablet (10 mEq total) by mouth daily. What changed:   how much to take  when to take this   predniSONE 20 MG  tablet Commonly known as: DELTASONE Take 3 tablets (60 mg total) by mouth daily with breakfast.   senna-docusate 8.6-50 MG tablet Commonly known as: Senokot-S Take 1 tablet by mouth 2 (two) times daily.      Follow-up Information    [facility], Elnita Maxwell, MD Follow up.   Specialties: Family Medicine, Interventional Cardiology, Radiology, Anesthesiology Contact information: [address] [place] [address] [phone]        Mauricia Area, MD Follow up.   Specialty: Nephrology Contact information: [place] [address] [phone]        Rigoberto Noel, MD Follow up.   Specialty: Pulmonary Disease Contact information: [address] [place] [address] [facility] Follow up.   Why: patient to come to short stay once a week for next 3 weeks to get riruximab dr deterding arranging Contact information: [address] [id number] [place] [address] [phone]         Allergies  Allergen Reactions  . Penicillins Anaphylaxis    Patient stated that within a few minutes of receiving a penicillin IM injection she rapidly became unconscious and required "additional" medication to recover. This occurred when she was ~68 years old and she does not recall what was given to assist in her recovery. She did not develop a rash.     Consultations: Nephrology Procedures/Studies: Dg Chest 2 View  Result Date: 07/05/2018 CLINICAL DATA:  Follow-up. EXAM: CHEST - 2 VIEW COMPARISON:  07/03/2018 FINDINGS: Lung volumes remain low. The cardiomediastinal silhouette is unchanged. There is peribronchial cuffing with increased patchy opacity in the perihilar regions bilaterally. Left greater than right basilar airspace opacities have not significantly changed with masslike left basilar density on prior CT. Small pleural effusions are suspected. No pneumothorax is identified.  IMPRESSION: Low lung volumes with mild worsening of bilateral lung opacities which may reflect edema or pneumonia. Electronically Signed   By: Logan Bores M.D.   On: 07/05/2018 08:07   Ct Angio Chest Pe W Or Wo Contrast  Result Date: 06/16/2018 CLINICAL DATA:  Shortness of breath EXAM: CT ANGIOGRAPHY CHEST WITH CONTRAST TECHNIQUE: Multidetector CT imaging of the chest was performed using the standard protocol during bolus administration of intravenous contrast. Multiplanar CT image reconstructions and MIPs were obtained to evaluate the vascular anatomy. CONTRAST:  52m OMNIPAQUE IOHEXOL 350 MG/ML SOLN COMPARISON:  Jun 09, 2018 CT angiogram chest; chest CT in thoracic MRI Jun 05, 2018 FINDINGS: Cardiovascular: There is no demonstrable pulmonary embolus. There is no thoracic aortic aneurysm or dissection. Visualized great vessels appear normal. No pericardial effusion or pericardial thickening. Mediastinum/Nodes: There remains diffuse enlargement of the left lobe of the thyroid causing deviation of the trachea in the upper thoracic region  toward the right. There is a mildly enhancing mass in the left lobe of the thyroid measuring 2.8 x 2.7 cm. Multiple subcentimeter lymph nodes are evident. No frank thoracic adenopathy evident. The prevertebral soft tissue thickening extending from T6-T10 is better seen on thoracic MR and prior CT with bolus timing not targeted for arterial phase imaging. No new soft tissue lesion evident. No esophageal lesions evident. Lungs/Pleura: There are there are fairly small pleural effusions bilaterally. There is airspace consolidation in both lower lobes. There is a masslike area in the left lower lobe posteriorly measuring 4.8 x 4.3 cm which is increased in size compared to 11 days prior and likely represents either progression of pneumonia or consolidation surrounding a mas areas of consolidation in both upper lobes and lingular regions have increased. Upper abdomen: There is mild  reflux of contrast into the inferior vena cava and hepatic veins. There is hepatic steatosis. Visualized upper abdominal structures otherwise appear unremarkable. Musculoskeletal: There are no blastic or lytic bone lesions. No chest wall lesions evident. IMPRESSION: 1. No demonstrable pulmonary embolus. No thoracic aortic aneurysm or dissection. 2. Fairly small right pleural effusions, slightly larger than on recent studies. Multifocal airspace consolidation, progressed. Suspect consolidation surrounding mass in left lower lobe posteriorly. 3.  Subcentimeter lymph nodes without frank adenopathy. 4. Enlarged left lobe of thyroid with dominant thyroid mass measuring 2.8 x 2.7 cm in the left lobe. It may be prudent to correlate with ultrasound given dominant thyroid mass on the left. 5. The paraspinous lesion from T6-T10 noted on recent studies is better seen on recent thoracic MR and chest CT not bolus timed to optimize arterial vascular visualization. Neoplastic etiology in this paraspinous lesion must be of concern as noted recent thoracic MR. 6. Mild reflux of contrast into the inferior vena cava and hepatic veins may indicate a degree of increase in right heart pressure. 7.  Hepatic steatosis. Electronically Signed   By: Lowella Grip III M.D.   On: 06/16/2018 17:16   Ct Abdomen Pelvis W Contrast  Result Date: 06/14/2018 CLINICAL DATA:  Abdominal distention, nausea, vomiting, pain, colitis, paravertebral phlegmon EXAM: CT ABDOMEN AND PELVIS WITH CONTRAST TECHNIQUE: Multidetector CT imaging of the abdomen and pelvis was performed using the standard protocol following bolus administration of intravenous contrast. CONTRAST:  130m OMNIPAQUE IOHEXOL 300 MG/ML SOLN, additional oral enteric contrast COMPARISON:  CT abdomen pelvis, 06/03/2018, CT chest, 06/05/2018, MR thoracic spine, 06/05/2018 FINDINGS: Lower chest: There are bilateral pleural effusions with associated atelectasis or consolidation and a large,  masslike consolidation of the dependent left lung base measuring at least 4.9 cm, which appears to be enlarged compared to prior CT of the chest dated 06/09/2018 (series 3, image 8). There is paravertebral fluid seen about the partially included anterior lower thoracic spine and diaphragmatic cruces (series 3, image 9). Hepatobiliary: No focal liver abnormality is seen. Hepatic steatosis. No gallstones, gallbladder wall thickening, or biliary dilatation. Pancreas: Unremarkable. No pancreatic ductal dilatation or surrounding inflammatory changes. Spleen: Normal in size without focal abnormality. Adrenals/Urinary Tract: Adrenal glands are unremarkable. Kidneys are normal, without renal calculi, focal lesion, or hydronephrosis. Bladder is unremarkable. Stomach/Bowel: Stomach is within normal limits. Appendix appears normal. Sigmoid diverticulosis. There is mild persistent fat stranding adjacent to the proximal sigmoid colon (series 3, image 60). Vascular/Lymphatic: No significant vascular findings are present. No enlarged abdominal or pelvic lymph nodes. Reproductive: Uterine fibroids. Other: No abdominal wall hernia or abnormality. No abdominopelvic ascites. Musculoskeletal: No acute or significant osseous findings. IMPRESSION:  1. Sigmoid diverticulosis. There is mild persistent fat stranding adjacent to the proximal sigmoid colon (series 3, image 60). This may reflect persistent diverticulitis/colitis or alternately chronic sequelae. No evidence of worsening colitis or developing complication such as abscess or perforation. 2. No other CT findings of the abdomen or pelvis to explain abdominal distension, nausea, vomiting, or pain. No evidence of bowel obstruction. 3. There are bilateral pleural effusions with associated atelectasis or consolidation and a large, masslike consolidation of the dependent left lung base measuring at least 4.9 cm, which appears to be enlarged compared to prior CT of the chest dated  06/09/2018 (series 3, image 8). 4. There is paravertebral fluid seen about the partially included anterior lower thoracic spine and diaphragmatic cruces (series 3, image 9). Consider dedicated chest imaging to further evaluate interval change in these findings. Electronically Signed   By: Eddie Candle M.D.   On: 06/14/2018 12:01   US Renal  Result Date: 07/03/2018 CLINICAL DATA:  Acute renal failure. EXAM: RENAL / URINARY TRACT ULTRASOUND COMPLETE COMPARISON:  None. FINDINGS: Right Kidney: Renal measurements: 13.1 x 5.5 x 4.6 cm = volume: 172 mL . Echogenicity within normal limits. No mass or hydronephrosis visualized. Left Kidney: Renal measurements: 12.9 x 7.8 x 6.0 cm = volume: 316 mL. Borderline increased parenchymal echogenicity. No mass or stone. No hydronephrosis Bladder: Appears normal for degree of bladder distention. IMPRESSION: 1. Borderline increased renal parenchymal echogenicity on the left suggesting medical renal disease. The left kidney is larger than the right. No renal masses, stones or hydronephrosis. Electronically Signed   By: Lajean Manes M.D.   On: 07/03/2018 17:52   Dg Chest Portable 1 View  Result Date: 07/03/2018 CLINICAL DATA:  Pts family states pt was discharged 1 wk ago (admitted for stomach mass and PNA). States the pt has been having dysuria x 2-3 days, also having bilateral leg swelling. Pt was told by doctor that labs yesterday reflected new onset DM and acute renal failure - sent by doc to ED for eval EXAM: PORTABLE CHEST 1 VIEW COMPARISON:  Chest CT, 06/16/2018. Prior chest radiographs, most recent dated 06/05/2018. FINDINGS: Lung volumes are low. There is opacity at both lung bases consistent with a combination of small pleural effusions and atelectasis. Pneumonia at either lung base is possible. There are prominent bronchovascular markings bilaterally, but no overt pulmonary edema. Cardiac silhouette is normal in size. No mediastinal or hilar masses. Skeletal structures  are grossly intact. IMPRESSION: 1. Small effusions with bilateral lung base opacities, which are most likely due to atelectasis. Consider pneumonia if there are consistent clinical findings. Prominent bronchovascular markings accentuated by the low lung volumes, without overt pulmonary edema. Electronically Signed   By: Lajean Manes M.D.   On: 07/03/2018 15:00   Dg Abd Portable 1v  Result Date: 06/11/2018 CLINICAL DATA:  Abdominal pain and distension. EXAM: PORTABLE ABDOMEN - 1 VIEW COMPARISON:  CT abdomen and pelvis 06/03/2018 FINDINGS: Gas is present in scattered nondilated loops of bowel without evidence of obstruction. No gross intraperitoneal free air is identified on this supine study. Thoracolumbar spondylosis is noted. The lung bases are not well evaluated due to motion artifact. IMPRESSION: Nonobstructed bowel gas pattern. Electronically Signed   By: Logan Bores M.D.   On: 06/11/2018 11:38   Dg Swallowing Func-speech Pathology  Result Date: 06/24/2018 Objective Swallowing Evaluation: Type of Study: Bedside Swallow Evaluation  Patient Details Name: Julia Johns MRN: [id number] Date of Birth: 1951-01-03 Today's Date: 06/24/2018 Time: SLP Start  Time (ACUTE ONLY): 1345 -SLP Stop Time (ACUTE ONLY): 1410 SLP Time Calculation (min) (ACUTE ONLY): 25 min Past Medical History: No past medical history on file. Past Surgical History: Past Surgical History: Procedure Laterality Date . VIDEO BRONCHOSCOPY Bilateral 06/18/2018  Procedure: VIDEO BRONCHOSCOPY WITH FLUORO;  Surgeon: Rigoberto Noel, MD;  Location: [place];  Service: Cardiopulmonary;  Laterality: Bilateral; HPI: This is a 68 year old female who presented as a transfer from [facility] with complaints of shortness of breath and blood-tinged sputum.  Patient underwent CT imaging which revealed a paraspinal soft tissue thickening from T6-T10 as well as a left lower lobe density within the CT which was enlarged from prior imaging  concerning for an inflammatory/infectious etiology. Bronchoscopy 5/28. No culture growth. Cytology with atypical cells. No malignancy identified. TB- AFBs neg x 2, Quantiferon TB indeterminate, Covid negative. MD ordered MBS for objective assessment of swallow function.  No data recorded Assessment / Plan / Recommendation CHL IP CLINICAL IMPRESSIONS 06/24/2018 Clinical Impression Pt demonstrates normal swallow function. She could not swallow a barium tablet, could not orally transit despite liquid and puree boluses given, said it was too large. No SLP f/u needed. Continue current diet.  SLP Visit Diagnosis Dysphagia, unspecified (R13.10) Attention and concentration deficit following -- Frontal lobe and executive function deficit following -- Impact on safety and function Mild aspiration risk   CHL IP TREATMENT RECOMMENDATION 06/24/2018 Treatment Recommendations No treatment recommended at this time   No flowsheet data found. CHL IP DIET RECOMMENDATION 06/24/2018 SLP Diet Recommendations Regular solids;Thin liquid Liquid Administration via Cup;Straw Medication Administration Whole meds with liquid Compensations -- Postural Changes --   No flowsheet data found.  No flowsheet data found.  No flowsheet data found.     CHL IP ORAL PHASE 06/24/2018 Oral Phase WFL Oral - Pudding Teaspoon -- Oral - Pudding Cup -- Oral - Honey Teaspoon -- Oral - Honey Cup -- Oral - Nectar Teaspoon -- Oral - Nectar Cup -- Oral - Nectar Straw -- Oral - Thin Teaspoon -- Oral - Thin Cup -- Oral - Thin Straw -- Oral - Puree -- Oral - Mech Soft -- Oral - Regular -- Oral - Multi-Consistency -- Oral - Pill -- Oral Phase - Comment --  CHL IP PHARYNGEAL PHASE 06/24/2018 Pharyngeal Phase WFL Pharyngeal- Pudding Teaspoon -- Pharyngeal -- Pharyngeal- Pudding Cup -- Pharyngeal -- Pharyngeal- Honey Teaspoon -- Pharyngeal -- Pharyngeal- Honey Cup -- Pharyngeal -- Pharyngeal- Nectar Teaspoon -- Pharyngeal -- Pharyngeal- Nectar Cup -- Pharyngeal -- Pharyngeal- Nectar  Straw -- Pharyngeal -- Pharyngeal- Thin Teaspoon -- Pharyngeal -- Pharyngeal- Thin Cup -- Pharyngeal -- Pharyngeal- Thin Straw -- Pharyngeal -- Pharyngeal- Puree -- Pharyngeal -- Pharyngeal- Mechanical Soft -- Pharyngeal -- Pharyngeal- Regular -- Pharyngeal -- Pharyngeal- Multi-consistency -- Pharyngeal -- Pharyngeal- Pill -- Pharyngeal -- Pharyngeal Comment --  No flowsheet data found. Herbie Baltimore, MA CCC-SLP Acute Rehabilitation Services Pager [phone] Office [phone] Lynann Beaver 06/24/2018, 2:48 PM              Dg C-arm 1-60 Min-no Report  Result Date: 06/18/2018 Fluoroscopy was utilized by the requesting physician.  No radiographic interpretation.   US Biopsy (kidney)  Result Date: 07/06/2018 INDICATION: 28 year old with acute kidney injury.  Request for renal biopsy. EXAM: ULTRASOUND-GUIDED RIGHT RENAL BIOPSY MEDICATIONS: None. ANESTHESIA/SEDATION: Moderate (conscious) sedation was employed during this procedure. A total of Versed 1.0 mg and Fentanyl 25 mcg was administered intravenously. Moderate Sedation Time: 16 minutes. The patient's level of consciousness and vital signs  were monitored continuously by radiology nursing throughout the procedure under my direct supervision. FLUOROSCOPY TIME:  None COMPLICATIONS: None immediate. PROCEDURE: Informed written consent was obtained from the patient with a Spanish translator after a thorough discussion of the procedural risks, benefits and alternatives. All questions were addressed. Maximal Sterile Barrier Technique was utilized including caps, mask, sterile gowns, sterile gloves, sterile drape, hand hygiene and skin antiseptic. A timeout was performed prior to the initiation of the procedure. Patient was placed prone and both kidneys were evaluated with ultrasound. The right kidney lower pole was targeted for biopsy. Right flank was prepped with chlorhexidine and sterile field was created. Skin and soft tissues were anesthetized with  1% lidocaine. Using ultrasound guidance, 16 gauge core biopsy needle was directed into the right kidney lower pole and a biopsy was obtained. Specimen placed in saline. A second core biopsy was obtained with ultrasound guidance. Two adequate core specimens were obtained. Follow-up ultrasound imaging was performed. Bandage placed over the puncture site. FINDINGS: Bilateral echogenic kidneys. Negative for hydronephrosis. Two adequate core biopsies obtained from the right kidney lower pole. No significant bleeding or hematoma formation following the core biopsies. IMPRESSION: Ultrasound-guided core biopsy of the right kidney lower pole. Electronically Signed   By: Markus Daft M.D.   On: 07/06/2018 15:24   Vas [place] Lower Extremity Venous (dvt)  Result Date: 06/17/2018  Lower Venous Study Indications: Edema.  Performing Technologist: Abram Sander RVS  Examination Guidelines: A complete evaluation includes B-mode imaging, spectral Doppler, color Doppler, and power Doppler as needed of all accessible portions of each vessel. Bilateral testing is considered an integral part of a complete examination. Limited examinations for reoccurring indications may be performed as noted.  +---------+---------------+---------+-----------+----------+--------------+ RIGHT    CompressibilityPhasicitySpontaneityPropertiesSummary        +---------+---------------+---------+-----------+----------+--------------+ CFV      Full           Yes      Yes                                 +---------+---------------+---------+-----------+----------+--------------+ SFJ      Full                                                        +---------+---------------+---------+-----------+----------+--------------+ FV Prox  Full                                                        +---------+---------------+---------+-----------+----------+--------------+ FV Mid   Full                                                         +---------+---------------+---------+-----------+----------+--------------+ FV DistalFull                                                        +---------+---------------+---------+-----------+----------+--------------+  PFV      Full                                                        +---------+---------------+---------+-----------+----------+--------------+ POP      Full           Yes      Yes                                 +---------+---------------+---------+-----------+----------+--------------+ PTV      Full                                                        +---------+---------------+---------+-----------+----------+--------------+ PERO                                                  Not visualized +---------+---------------+---------+-----------+----------+--------------+   +---------+---------------+---------+-----------+----------+--------------+ LEFT     CompressibilityPhasicitySpontaneityPropertiesSummary        +---------+---------------+---------+-----------+----------+--------------+ CFV      Full           Yes      Yes                                 +---------+---------------+---------+-----------+----------+--------------+ SFJ      Full                                                        +---------+---------------+---------+-----------+----------+--------------+ FV Prox  Full                                                        +---------+---------------+---------+-----------+----------+--------------+ FV Mid   Full                                                        +---------+---------------+---------+-----------+----------+--------------+ FV DistalFull                                                        +---------+---------------+---------+-----------+----------+--------------+ PFV      Full                                                         +---------+---------------+---------+-----------+----------+--------------+  POP      Full           Yes      Yes                                 +---------+---------------+---------+-----------+----------+--------------+ PTV      Full                                                        +---------+---------------+---------+-----------+----------+--------------+ PERO                                                  Not visualized +---------+---------------+---------+-----------+----------+--------------+     Summary: Right: There is no evidence of deep vein thrombosis in the lower extremity. No cystic structure found in the popliteal fossa. Left: There is no evidence of deep vein thrombosis in the lower extremity. No cystic structure found in the popliteal fossa.  *See table(s) above for measurements and observations. Electronically signed by Harold Barban MD on 06/17/2018 at 1:08:38 PM.    Final    (Echo, Carotid, EGD, Colonoscopy, ERCP)    Subjective: Anxious to go home refusing breakfast wants Jell-O and Ensure  Discharge Exam: Vitals:   07/09/18 0511 07/09/18 0900  BP: (!) 104/55 (!) 112/59  Pulse: 63 69  Resp: 20 18  Temp: 98 F (36.7 C) 98 F (36.7 C)  SpO2: 92% 95%   Vitals:   07/08/18 2110 07/09/18 0500 07/09/18 0511 07/09/18 0900  BP: (!) 98/50  (!) 104/55 (!) 112/59  Pulse: 62  63 69  Resp: 20  20 18   Temp: 98.3 F (36.8 C)  98 F (36.7 C) 98 F (36.7 C)  TempSrc:   Oral Oral  SpO2: 100%  92% 95%  Weight: 92.1 kg 92.1 kg    Height:        General: Pt is alert, awake, not in acute distress Cardiovascular: RRR, S1/S2 +, no rubs, no gallops Respiratory: CTA bilaterally, no wheezing, no rhonchi Abdominal: Soft, NT, ND, bowel sounds + Extremities: 1+ pitting edema   The results of significant diagnostics from this hospitalization (including imaging, microbiology, ancillary and laboratory) are listed below for reference.     Microbiology: Recent  Results (from the past 240 hour(s))  Urine culture     Status: None   Collection Time: 07/03/18  3:56 PM   Specimen: Urine, Clean Catch  Result Value Ref Range Status   Specimen Description URINE, CLEAN CATCH  Final   Special Requests NONE  Final   Culture   Final    NO GROWTH Performed at [facility], [address] [place], [place] [address]    Report Status 07/04/2018 FINAL  Final  Novel Coronavirus,NAA,(SEND-OUT TO REF LAB - TAT 24-48 hrs); Hosp Order     Status: None   Collection Time: 07/03/18  5:25 PM   Specimen: Nasopharyngeal Swab; Respiratory  Result Value Ref Range Status   SARS-CoV-2, NAA NOT DETECTED NOT DETECTED Final    Comment: (NOTE) This test was developed and its performance characteristics determined by [employer]. This test has not been FDA cleared or  approved. This test has been authorized by FDA under an Emergency Use Authorization (EUA). This test is only authorized for the duration of time the declaration that circumstances exist justifying the authorization of the emergency use of in vitro diagnostic tests for detection of SARS-CoV-2 virus and/or diagnosis of COVID-19 infection under section 564(b)(1) of the Act, 21 U.S.C. 071QRF-7(J)(8), unless the authorization is terminated or revoked sooner. When diagnostic testing is negative, the possibility of a false negative result should be considered in the context of a patient's recent exposures and the presence of clinical signs and symptoms consistent with COVID-19. An individual without symptoms of COVID-19 and who is not shedding SARS-CoV-2 virus would expect to have a negative (not detected) result in this assay. Performed  At: [facility] [address] [place], [place] [id number] Rush Farmer MD EB:[id number]    [place]  Final    Comment: Performed at [facility], [place] [address], [place], [place] [address]     Labs: BNP (last 3  results) No results for input(s): BNP in the last 8760 hours. Basic Metabolic Panel: Recent Labs  Lab 07/05/18 0709 07/06/18 0638 07/07/18 0553 07/08/18 0237 07/09/18 0526  NA 137 141 141 141 141  K 4.2 3.5 3.1* 3.3* 3.1*  CL 103 105 101 97* 96*  CO2 21* 21* 24 27 29   GLUCOSE 205* 224* 233* 219* 220*  BUN 66* 85* 107* 119* 128*  CREATININE 4.27* 4.37* 4.31* 3.83* 3.27*  CALCIUM 8.1* 8.4* 8.5* 8.5* 8.6*  PHOS  --   --  8.4* 8.6* 7.5*   Liver Function Tests: Recent Labs  Lab 07/04/18 1345 07/07/18 0553 07/08/18 0237 07/09/18 0526  AST 26  --   --   --   ALT 21  --   --   --   ALKPHOS 119  --   --   --   BILITOT 0.7  --   --   --   PROT 6.5  --   --   --   ALBUMIN 1.8* 2.3* 2.3* 2.4*   No results for input(s): LIPASE, AMYLASE in the last 168 hours. No results for input(s): AMMONIA in the last 168 hours. CBC: Recent Labs  Lab 07/04/18 1345 07/05/18 0709 07/06/18 0638 07/07/18 0553  WBC 17.5* 16.0* 17.1* 15.3*  NEUTROABS  --   --  16.0* 14.0*  HGB 9.1* 8.8* 9.3* 9.0*  HCT 28.5* 28.0* 28.7* 28.0*  MCV 87.4 87.5 85.4 84.1  PLT 442* 482* 508* 518*   Cardiac Enzymes: No results for input(s): CKTOTAL, CKMB, CKMBINDEX, TROPONINI in the last 168 hours. BNP: Invalid input(s): POCBNP CBG: Recent Labs  Lab 07/08/18 1620 07/08/18 2109 07/09/18 0702 07/09/18 1124 07/09/18 1613  GLUCAP 253* 213* 248* 322* 251*   D-Dimer No results for input(s): DDIMER in the last 72 hours. Hgb A1c No results for input(s): HGBA1C in the last 72 hours. Lipid Profile No results for input(s): CHOL, HDL, LDLCALC, TRIG, CHOLHDL, LDLDIRECT in the last 72 hours. Thyroid function studies No results for input(s): TSH, T4TOTAL, T3FREE, THYROIDAB in the last 72 hours.  Invalid input(s): FREET3 Anemia work up No results for input(s): VITAMINB12, FOLATE, FERRITIN, TIBC, IRON, RETICCTPCT in the last 72 hours. Urinalysis    Component Value Date/Time   COLORURINE YELLOW 07/03/2018 [place] 07/03/2018 1604   LABSPEC 1.009 07/03/2018 1604   PHURINE 8.0 07/03/2018 1604   GLUCOSEU NEGATIVE 07/03/2018 1604   HGBUR LARGE (A) 07/03/2018 1604  BILIRUBINUR NEGATIVE 07/03/2018 [place] 07/03/2018 1604   PROTEINUR 100 (A) 07/03/2018 1604   NITRITE NEGATIVE 07/03/2018 1604   LEUKOCYTESUR NEGATIVE 07/03/2018 1604   Sepsis Labs Invalid input(s): PROCALCITONIN,  WBC,  LACTICIDVEN Microbiology Recent Results (from the past 240 hour(s))  Urine culture     Status: None   Collection Time: 07/03/18  3:56 PM   Specimen: Urine, Clean Catch  Result Value Ref Range Status   Specimen Description URINE, CLEAN CATCH  Final   Special Requests NONE  Final   Culture   Final    NO GROWTH Performed at [facility], [place] [address] [place], [place] [address]    Report Status 07/04/2018 FINAL  Final  Novel Coronavirus,NAA,(SEND-OUT TO REF LAB - TAT 24-48 hrs); Hosp Order     Status: None   Collection Time: 07/03/18  5:25 PM   Specimen: Nasopharyngeal Swab; Respiratory  Result Value Ref Range Status   SARS-CoV-2, NAA NOT DETECTED NOT DETECTED Final    Comment: (NOTE) This test was developed and its performance characteristics determined by [employer]. This test has not been FDA cleared or approved. This test has been authorized by FDA under an Emergency Use Authorization (EUA). This test is only authorized for the duration of time the declaration that circumstances exist justifying the authorization of the emergency use of in vitro diagnostic tests for detection of SARS-CoV-2 virus and/or diagnosis of COVID-19 infection under section 564(b)(1) of the Act, 21 U.S.C. 700FVC-9(S)(4), unless the authorization is terminated or revoked sooner. When diagnostic testing is negative, the possibility of a false negative result should be considered in the context of a patient's recent exposures and the presence of clinical signs and symptoms consistent  with COVID-19. An individual without symptoms of COVID-19 and who is not shedding SARS-CoV-2 virus would expect to have a negative (not detected) result in this assay. Performed  At: [facility] [address] [place], [place] [id number] Rush Farmer MD GY:[id number]    [place]  Final    Comment: Performed at [facility], [place] [address], [place], [place] [address]     Time coordinating discharge: 34 minutes  SIGNED:   Georgette Shell, MD  [facility] 07/10/2018, 1:28 PM Pager   If 7PM-7AM, please contact night-coverage [email or web address] Password [email or web address]

## 2018-07-09 NOTE — Progress Notes (Signed)
DISCHARGE NOTE HOME Julia Johns to be discharged to home per MD order.Called Spanish interpreter to discussed instructions for next medical appointments to several doctors, especially the renal doctor for the next chemo dose next week   and discussed also her prescriptions medicines that was sent to her pharmacy. Prescriptions given to patient; medication list explained in detail. Patient verbalized understanding.  Skin issues as documented on her chart . IV catheter discontinued intact. Site without signs and symptoms of complications. Dressing and pressure applied. Pt denies pain at the site currently. Foley catheter discontinued. No complaints noted.  Patient free of lines, drains, and wounds.   An After Visit Summary (AVS) was printed  On Spanish version which was discussed at length to the patient with the help of an interpreter and given to her .English version which was given and discussed to her son .Reminded [name] to document all the results of her blood sugar checking at home and bring to her next medical appointment. Patient escorted via wheelchair, and discharged home via private auto -[name].  Varnado, Zenon Mayo, RN

## 2018-07-09 NOTE — Plan of Care (Signed)
Called interpreter for her discharged instructions .

## 2018-07-09 NOTE — Discharge Instructions (Signed)
Check blood sugar three times daily and document aand take it to pcp Contar carbohidratos y la diabetes  Por qu es importante el conteo de carbohidratos?   Contar las porciones de carbohidratos ayuda a Freight forwarder nivel de glucosa (azcar) en su sangre para que se sienta mejor.   El equilibrio [employer] carbohidratos que come y Best boy determina el nivel de glucosa que tendr en la sangre despus de comer.   Contar carbohidratos tambin le ayudar a planificar sus comidas.   Qu alimentos contienen carbohidratos?  Entre los alimentos con carbohidratos se incluyen:   Panes, galletas saladas y cereales   Pastas, arroz y Forensic psychologist (verduras) con almidn, como papas, elote (maz o Therapist, music) y Facilities manager (guisantes o arvejas)   Optometrist (habichuelas) y legumbres   Donna, [place] de soya y Estate agent   Frutas y jugos de fruta   Dulces como pasteles, Administrator, helados, mermeladas y Geologist, engineering   Porciones de carbohidratos  Al planificar comidas para la diabetes, recuerde que un alimento con 1 porcin de carbohidratos contiene aproximadamente 15 gramos de carbohidratos:   Revise el tamao de las porciones con tazas y cucharas de medir o con una pesa de alimentos.   Lea los Datos de Nutricin en las etiquetas de los alimentos para saber cuntos gramos de carbohidratos contienen los alimentos que come.   Los Estée Lauder de este folleto muestran porciones que contienen cerca de 15 gramos de carbohidratos.   Consejos para planificar sus comidas   Un Plan de Alimentacin indica cuntas porciones de carbohidratos consumir en sus comidas y refrigerios (snacks). Para muchos adultos es adecuado comer 3 a 5 porciones de carbohidratos en cada comida y de 1 a 2 porciones de carbohidratos, en cada refrigerio.   En un Plan de Alimentacin diaria saludable, la mayora de los carbohidratos provienen de:  o Al menos 6 porciones de frutas y vegetales sin almidn  o Al menos 6 porciones  de Engineer, manufacturing, frijoles y Photographer con almidn, con al menos 3 de estas porciones de granos integrales (enteros)  o Al menos 2 porciones de Air traffic controller o productos lcteos   Revise regularmente su nivel de glucosa en la sangre. Esto puede indicarle si necesita ajustar las horas a las que consume carbohidratos.   Comer alimentos que contienen [place], como granos [place], y comer muy pocos alimentos salados es bueno para su salud.   Coma 4 a 6 onzas de carne u otros alimentos con protenas (como hamburguesas de soya) cada da. Elija fuentes de protena bajas en grasa, como carne de res y de cerdo bajas en grasa, pollo, pescado, queso bajo en grasa o alimentos vegetarianos como la soya.   Coma algunas grasas saludables, como aceite de [place], de canola y nueces.   Coma muy pocas grasas saturadas. Estas grasas no son saludables y se Occupational psychologist, la crema y las carnes con mucha grasa, como el tocino (tocineta) y las salchichas o Photographer.   Coma muy pocas o nada de grasas trans. Estas grasas no son saludables y se encuentran en todos los alimentos que contienen aceites parcialmente hidrogenados en su lista de ingredientes.   Consejos para leer etiquetas  En los Datos de Nutricin de las etiquetas aparece una lista con el total de gramos de carbohidratos en una porcin estndar. La porcin estndar puede ser mayor o menor que 1 porcin de carbohidratos. Para saber cuntas porciones de carbohidratos hay en un alimento:   Primero mire el tamao  de la porcin estndar de la etiqueta.   Luego verifique el total de gramos de carbohidratos. Esta es la cantidad de carbohidratos en 1 porcin estndar. Divida el total de gramos de carbohidratos por 15. Este nmero equivale al nmero de porciones de carbohidratos en 1 porcin estndar. Recuerde: 1 porcin de carbohidratos equivale a 15 gramos de carbohidratos.   Nota: Puede ignorar los gramos de [employer] Datos de Nutricin, ya que estn  incluidos en el total de gramos de carbohidratos.   Listas de alimentos para el conteo de carbohidratos  1 porcin = cerca de 15 gramos de carbohidratos  Almidones   1 rebanada de pan (1 onza)   1 tortilla (6 pulgadas)    rosca de pan (bagel) grande (1 onza)   2 tortillas para taco (5 pulgadas)    pan para hamburguesa o para salchicha (hot dog) (3/4 onza)    taza de cereal listo para comer sin endulzar    taza de cereal cocido   1 taza de sopa a base de caldo   4-6 galletitas saladas   ? taza de pasta o arroz (cocidos)    taza de frijoles, chcharos, granos de elote, camotes (batatas, boniatos), calabaza (zapallo), pur de papas o papas hervidas (cocidos)    papa grande asada (3 onzas)    onza de pretzels, papitas o totopos (tortilla chips)   3 tazas de palomitas de maz (popcorn) (ya preparadas)   Frutas   1 fruta fresca pequea ( a 1 taza)    taza de fruta enlatada o congelada   17 uvas pequeas (3 onzas)   1 taza de meln, bayas (moras)    vaso de jugo de fruta   2 cucharadas de frutas secas (arndanos azules/blueberries, cerezas, arndanos rojos/cranberries, frutas surtidas, uvas pasas/pasitas)   Leche   1 taza de [employer] o reducida en grasa   1 taza de leche de soya   ? taza de yogur descremado endulzado con un edulcorante sin azcar (6 onzas)   Dulces y postres   pastel cuadrado de 2 pulgadas (sin betn/cobertura)   2 galletitas dulces (? onzas)    taza de helado o yogur congelado    taza de sorbete (sherbet) o nieve (sorbet)   1 cucharada de jarabe (sirope), mermelada, jalea, azcar o miel   2 cucharadas de jarabe bajo en caloras  Otros alimentos   Cuente 1 taza de vegetales crudos o  taza de vegetales sin almidn, cocidas, como porciones de alimentos con cero (0) carbohidratos o sin restriccin. Si come 3 o ms porciones en una comida, cuntelas como 1 porcin de carbohidratos.   Los alimentos que contienen  menos de 20 caloras en cada porcin tambin pueden contarse como porciones con cero carbohidratos o alimentos sin restriccin.   Cuente 1 taza de guiso (estofado) u otros alimentos combinados como 2 porciones de carbohidratos.     Contar carbohidratos y la diabetes: Ejemplo de men para 1 da [place]  1 pltano/banana pequeo (1 carbohidrato)   taza de hojuelas de maz (cornflakes) (1 carbohidrato)  1 taza de leche descremada o baja en grasa (1 carbohidrato)  1 rebanada de pan de trigo integral (1 carbohidrato)  1 cucharadita de margarina   Almuerzo  2 onzas de rebanadas de [place]  2 rebanadas de pan de trigo integral (2 carbohidratos)  2 hojas de Acupuncturist  4 palitos de apio  4 palitos de zanahoria  1 manzana mediana (1 carbohidrato)  1 taza de [employer]  o baja en grasa (1 carbohidrato)   Refrigerio  2 cucharadas de uvas pasas/pasitas (1 carbohidrato)   onzas de mini pretzels sin sal (1 carbohidrato)   Cena  3 onzas de carne asada de res, magra   papa grande asada (2 carbohidratos)  1 cucharada de crema agria reducida en grasa   taza de ejotes/habichuelas verdes/chauchas  1 taza de ensalada de vegetales  1 cucharada de aderezo para ensaladas reducido en caloras  1 panecillo de trigo integral (1 carbohidrato)  1 cucharadita de margarina  1 taza de bolitas de meln (1 carbohidrato)   Refrigerio  6 onzas de yogur de frutas bajo en grasa, sin azcar (1 carbohidrato)  2 cucharadas de nueces sin sal

## 2018-07-10 ENCOUNTER — Encounter: Payer: Self-pay | Admitting: *Deleted

## 2018-07-10 ENCOUNTER — Other Ambulatory Visit: Payer: Self-pay | Admitting: *Deleted

## 2018-07-10 LAB — CRYOGLOBULIN

## 2018-07-10 NOTE — Patient Outreach (Signed)
[facility]) Care Management  [place]  07/10/2018 Julia Johns 1951/01/13 [id number]   Initial post discharge telephone outreach  Transition of care by practice of Dr.Cox, PCP.   Referral received 6/17 Referral source: [facility] hospital liaison Referral reason: Complex care management , new onset of Diabetes , A1c 7.9 and assess for further needs after discharge.  Hospital admission 6/12- 6/18, Dx : Acute renal failure, Vasculitis , bilateral lower leg edema,   Chart reviewed :  Patient is a 68 year old female originally from [place], [place] speaking,who was recently discharged from [facility] 5/19-6/5  after work-up for shortness of breath and hemoptysis.Extensive work-up on last admission revealed 4 x 3 cm LLL mass-likeconsolidation as well as paraspinal fluid collection.  Patient underwent a bronchoscopy on 06/18/2018 and repeat bronchoscopy on 06/25/2018 with inconclusive pathology findings but no clear evidence of malignancy.  Subjective:  Outreach call to patient, using [place] line,(ID [phone]) introduced self, explained reason for the call.  Patient reports feeling better but still a little weak. She discussed her legs still with swelling and feeling heavy, and that she is using the walker for support.  Patient denies shortness of breath or chest pain, she reports that she no longer needs to wear oxygen at home prescribed at previous admission .  Patient reports that her weight today is 175 lbs.  Patient gave verbal agreement that I may speak with her son Julia Johns about any of her health conditions and follow up as he helps take care of her.  Diabetes  Discussed with patient son new prescription for blood sugar monitor, he reports being able to get meter on yesterday and with the help of his wife they have been able to monitor patient blood sugar. He reports reading this am is 175. Patient has questions about normal reading and when  to be concerned.   Reports buying and preparing meals with vegetables and meat and limiting starchy foods. He reports reading material that he was provided at discharge about meal planning.   Social  Patient lives at home with her son Julia Johns, his wife and 2 sons, he is off work for the next week. He states that they are planning on having someone with patient at all times.  Patient has a walker for use at home, son asking about commode chair for patient to make it easier for her, as she is on fluid pills twice daily for now. Patient has a bell that he is currently using to patient to ring when she needs to get up, he states that he does not want her to fall.  Patient son will provide transportation to office follow up visit. Patient son reports that he has not heard from home health yet today, reviewed anticipate call usually within 48 hours of discharge. Medication  Patient was recently discharged from hospital and all medications have been reviewed. Patient son denies cost concern related to medication, he is assisting with patient managing her medication . Appointments Reviewed with patient son, appointment on 6/24 at 8 am [place] for [name] infusion .  Patient has several post discharge follow up appointments to be scheduled, patient son is planning on call today/monday to set up and has contact numbers.  Dr.Alva,Pulmonary Dr.Deterding,Nephrology Dr.Cox, PCP    Encounter Medications:  Outpatient Encounter Medications as of 07/10/2018  Medication Sig Note  . blood glucose meter kit and supplies Dispense based on patient and insurance preference. Use up to four times daily as directed. (  FOR ICD-10 E10.9, E11.9).   . furosemide (LASIX) 80 MG tablet Take 1 tablet (80 mg total) by mouth 2 (two) times daily.   Marland Kitchen glipiZIDE (GLUCOTROL) 5 MG tablet Take 0.5 tablets (2.5 mg total) by mouth 2 (two) times daily before a meal.   . potassium chloride (K-DUR) 10 MEQ tablet Take 1 tablet (10 mEq  total) by mouth daily.   . predniSONE (DELTASONE) 20 MG tablet Take 3 tablets (60 mg total) by mouth daily with breakfast.   . bisacodyl (DULCOLAX) 10 MG suppository Place 1 suppository (10 mg total) rectally daily as needed for moderate constipation. (Patient not taking: Reported on 07/10/2018) 07/03/2018: Have not started yet   . senna-docusate (SENOKOT-S) 8.6-50 MG tablet Take 1 tablet by mouth 2 (two) times daily. (Patient not taking: Reported on 07/10/2018) 07/03/2018: Have not started yet   No facility-administered encounter medications on file as of 07/10/2018.     Functional Status:  In your present state of health, do you have any difficulty performing the following activities: 07/10/2018 07/03/2018  Hearing? N N  Vision? N N  Difficulty concentrating or making decisions? N N  Walking or climbing stairs? Y Y  Comment uses walker -  Dressing or bathing? [name]  Comment needs help from family -  Doing errands, shopping? Y N  Comment family helps -  Conservation officer, nature and eating ? Y -  Comment familly helps -  In the past six months, have you accidently leaked urine? N -  Do you have problems with loss of bowel control? N -  Managing your Medications? Y -  Comment son helps -  Managing your Finances? Y -  Comment family helps -  Housekeeping or managing your Housekeeping? Y -  Comment family helps -    Fall/Depression Screening: Fall Risk  07/10/2018  Falls in the past year? 1  Number falls in past yr: 0  Injury with Fall? 0  Risk for fall due to : Impaired balance/gait;History of fall(s)  Follow up Falls prevention discussed   PHQ 2/9 Scores 07/10/2018  PHQ - 2 Score 0    Assessment:   Patient will benefit from ongoing complex case management for education and support of chronic condition of Diabetes and further assessment of care needs.  Plan:  Will plan follow up call in the next week, for further assessment . Provided patient son with [facility] care management contact information.   Will send patient welcome letter and Physician barrier/involvment letter.  Will send [facility] diabetes book in [place].  Reinforced fall precaution use of walker. Placed call to Dr.Cox office regarding patient need of [facility] [place], spoke with nurse that verifies he will get message to Dr.Cox as order will need to go to [place]. Returned call to patient son Julia Johns to inform.  Placed call to [place] spoke with Baldo Ash that verifies they have received referral and that it is in intake and patient will be outreached patient son informed.    [facility] CM Care Plan Problem One     Most Recent Value  Care Plan Problem One  High risk for readmission related to recent discharge and less than 30 day readmit for Acute kidney injury   Role Documenting the Problem One  Care Management [place] for Problem One  Active  [facility] Long Term Goal   Patient will not experience and admission over the next 31 days   [facility] Long Term Goal Start Date  07/10/18  Interventions for  Problem One Long Term Goal  Reviewed discharge instruction information, advised importance of notifying MD sooner of concerns related to increase in weakness, swelling .   [facility] CM Short Term Goal #1   Over the next 24 days patient will attend all medication appointments   [facility] CM Short Term Goal #1 Start Date  07/10/18  Interventions for Short Term Goal #1  Advised regarding the importance of scheduling post discharge appointments, reviewed recommended follow ups and having contact number. Offered to assist patient son prefers to make contact.   [facility] CM Short Term Goal #2   Over the next 30 days patient will continue to weigh daily and keep a record .   [facility] CM Short Term Goal #2 Start Date  07/10/18  Interventions for Short Term Goal #2  Discussed the importance of keeping track of daily weights and keeping a log, advised regarding notifying MD of increase in swelling or sudden weight gain of 3 pounds in a day. Discussed rationale of fluid  and potassium pills     [facility] CM Care Plan Problem Two     Most Recent Value  Care Plan Problem Two  Knowledge deficit related to new diagnosis of Diabetes   Role Documenting the Problem Two  Care Management Coordinator  Care Plan for Problem Two  Active  Interventions for Problem Two Long Term Goal   Reviewed with patient/son importance of taking medication as prescribed, reviewed importance of blood sugar control and managing by taking medications, monitoring reading , eating healthy and being active as possible.   [facility] Long Term Goal  Over the next 90 days patient will be able to identify at least measures of self care managment of Diabetes    [facility] Long Term Goal Start Date  07/10/18  [facility] CM Short Term Goal #1   Over the next 30 days patient/son will be able to reports monitioring blood sugar 3 times daily and keeping a record.    [facility] CM Short Term Goal #1 Start Date  07/10/18  Interventions for Short Term Goal #2   Verified patient son has obtained monitor after discharge, discussed education in how to use monitor voices understanding after following included instructions. Discussed usual blood sugar readings and when to call MD for low blood sugar readings of less than 70,  2 episodes in a day and over 250 x 2 days or as instructed by MD  Reviewed checking reading before meals .   [facility] CM Short Term Goal #2   Patient/son will be able to verbalize 2 symptoms of low blood sugar over the next 30 days   [facility] CM Short Term Goal #2 Start Date  07/10/18  Interventions for Short Term Goal #2  provided education on low blood sugar readings , and symptoms to look for , increased weakness, shakiness, sweating , Reviewed rule of 15 for treating episode including examples of fast acting sugar to use. Will send [facility] Diabetes education book in [place], [place], [place] Management Coordinator  [phone]- Mobile [phone]- [place]

## 2018-07-13 DIAGNOSIS — N189 Chronic kidney disease, unspecified: Secondary | ICD-10-CM | POA: Diagnosis not present

## 2018-07-13 DIAGNOSIS — N179 Acute kidney failure, unspecified: Secondary | ICD-10-CM | POA: Diagnosis not present

## 2018-07-14 ENCOUNTER — Other Ambulatory Visit (HOSPITAL_COMMUNITY): Payer: Self-pay

## 2018-07-14 ENCOUNTER — Other Ambulatory Visit: Payer: Self-pay | Admitting: *Deleted

## 2018-07-14 DIAGNOSIS — E119 Type 2 diabetes mellitus without complications: Secondary | ICD-10-CM | POA: Diagnosis not present

## 2018-07-14 DIAGNOSIS — S70311D Abrasion, right thigh, subsequent encounter: Secondary | ICD-10-CM | POA: Diagnosis not present

## 2018-07-14 DIAGNOSIS — E669 Obesity, unspecified: Secondary | ICD-10-CM | POA: Diagnosis not present

## 2018-07-14 DIAGNOSIS — R918 Other nonspecific abnormal finding of lung field: Secondary | ICD-10-CM | POA: Diagnosis not present

## 2018-07-14 DIAGNOSIS — Z7984 Long term (current) use of oral hypoglycemic drugs: Secondary | ICD-10-CM | POA: Diagnosis not present

## 2018-07-14 DIAGNOSIS — R222 Localized swelling, mass and lump, trunk: Secondary | ICD-10-CM | POA: Diagnosis not present

## 2018-07-14 DIAGNOSIS — D649 Anemia, unspecified: Secondary | ICD-10-CM | POA: Diagnosis not present

## 2018-07-14 DIAGNOSIS — Z7951 Long term (current) use of inhaled steroids: Secondary | ICD-10-CM | POA: Diagnosis not present

## 2018-07-14 DIAGNOSIS — Z8701 Personal history of pneumonia (recurrent): Secondary | ICD-10-CM | POA: Diagnosis not present

## 2018-07-14 NOTE — Patient Outreach (Signed)
[facility] [place]) Care Management  [place]  07/14/2018   [name] 03-14-1950 [id number]   Telephone  assessment   Referral received 6/17 Referral source: [facility] Johns liaison Referral reason: Complex care management , new onset of Diabetes , A1c 7.9 and assess for further needs after discharge.  Johns admission 6/12- 6/18, Dx : Acute renal failure, Vasculitis , bilateral lower leg edema,    Subjective:  Outreach call to patient son, Julia Johns, contact for patient, as agreed by patient ,,he discussed patient continues to improve daily, she is still weak but getting there.  He discussed some delay in home health making contact due to calling patient phone and she does not always answer or have her phone, he is not listed as the main contact.  He discussed home visit today or Friday days that he agreed upon.   Diabetes He discussed improved reading , he states concern regarding elevated blood sugars on Friday 275 to 400, but readings have improved since then. He reports the highest reading in the last 3 days have been 170 to 200 with 316 being the highest , with today's reading 170. Denies having low blood sugar reading or symptoms on review.  They continue to work on adjusting diet , working on balanced meals, portions sizes.  Vasculitis  Son reports patient with decreasing in swelling, in legs her weight is holding steady at 175 and down to 174 today.  Patient continues to use walker . BSC has been ordered by PCP office and son anticipates delivery to home.  Patient son reports getting follow up labs done on Monday . He discussed patient visit on tomorrow for Rituxin infusion.   Encounter Medications:  Outpatient Encounter Medications as of 07/14/2018  Medication Sig Note  . bisacodyl (DULCOLAX) 10 MG suppository Place 1 suppository (10 mg total) rectally daily as needed for moderate constipation. (Patient not taking: Reported on 07/10/2018)  07/03/2018: Have not started yet   . blood glucose meter kit and supplies Dispense based on patient and insurance preference. Use up to four times daily as directed. (FOR ICD-10 E10.9, E11.9).   . furosemide (LASIX) 80 MG tablet Take 1 tablet (80 mg total) by mouth 2 (two) times daily.   Marland Kitchen glipiZIDE (GLUCOTROL) 5 MG tablet Take 0.5 tablets (2.5 mg total) by mouth 2 (two) times daily before a meal.   . potassium chloride (K-DUR) 10 MEQ tablet Take 1 tablet (10 mEq total) by mouth daily.   . predniSONE (DELTASONE) 20 MG tablet Take 3 tablets (60 mg total) by mouth daily with breakfast.   . senna-docusate (SENOKOT-S) 8.6-50 MG tablet Take 1 tablet by mouth 2 (two) times daily. (Patient not taking: Reported on 07/10/2018) 07/03/2018: Have not started yet   No facility-administered encounter medications on file as of 07/14/2018.     Functional Status:  In your present state of health, do you have any difficulty performing the following activities: 07/10/2018 07/03/2018  Hearing? N N  Vision? N N  Difficulty concentrating or making decisions? N N  Walking or climbing stairs? Y Y  Comment uses walker -  Dressing or bathing? [name]  Comment needs help from family -  Doing errands, shopping? Y N  Comment family helps -  Conservation officer, nature and eating ? Y -  Comment familly helps -  In the past six months, have you accidently leaked urine? N -  Do you have problems with loss of bowel control? N -  Managing  your Medications? Y -  Comment son helps -  Managing your Finances? Y -  Comment family helps -  Housekeeping or managing your Housekeeping? Y -  Comment family helps -    Fall/Depression Screening: Fall Risk  07/10/2018  Falls in the past year? 1  Number falls in past yr: 0  Injury with Fall? 0  Risk for fall due to : Impaired balance/gait;History of fall(s)  Follow up Falls prevention discussed   PHQ 2/9 Scores 07/10/2018  PHQ - 2 Score 0    Assessment:  Patient will benefit from continued  education and support on diabetes self care management . May benefit from face to face diabetes education class when available , he plans to discuss with PCP at visit this week.   Plan:  Will plan follow up call in the next month.   [facility] CM Care Plan Problem One     Most Recent Value  Care Plan Problem One  High risk for readmission related to recent discharge and less than 30 day readmit for Acute kidney injury   Role Documenting the Problem One  Care Management [place] for Problem One  Active  [facility] Long Term Goal   Patient will not experience and admission over the next 31 days   [facility] Long Term Goal Start Date  07/10/18  Interventions for Problem One Long Term Goal  Discussed current clinical state, reinforced contining medication as prescribed , notify MD of worsening sympttoms, to scheduled sooner visit to avoid readmission , reviewed 24 nurse advice line .   [facility] CM Short Term Goal #1   Over the next 24 days patient will attend all medication appointments   [facility] CM Short Term Goal #1 Start Date  07/10/18  Interventions for Short Term Goal #1  Veriifed patient has scheduled PCP visit, and reviewed other speciality visit, kidney specialist , and to schedule pulmonary visit .   [facility] CM Short Term Goal #2   Over the next 30 days patient will continue to weigh daily and keep a record .   [facility]N CM Short Term Goal #2 Start Date  07/10/18  Interventions for Short Term Goal #2  REviewed daily weights and continued to monitor daily and notiify MD of sudden elevations over 2 pounds in a day or 5 in a week, shortness of breath .     [place] Johns CM Care Plan Problem Two     Most Recent Value  Care Plan Problem Two  Knowledge deficit related to new diagnosis of Diabetes   Role Documenting the Problem Two  Care Management Coordinator  Care Plan for Problem Two  Active  Interventions for Problem Two Long Term Goal   Discussed recent blood sugar readings and continue taking medications as prescribed,  notify MD of reading of concerns less than 70 consistent readings over 250   [facility]N Long Term Goal  Over the next 90 days patient will be able to identify at least measures of self care managment of Diabetes    [facility]N Long Term Goal Start Date  07/10/18  [facility]N CM Short Term Goal #1   Over the next 30 days patient/son will be able to reports monitioring blood sugar 3 times daily and keeping a record.    [facility]N CM Short Term Goal #1 Start Date  07/10/18  Interventions for Short Term Goal #2   Reinforced continuing to monitor readings and take record /meter to PCP visit   [facility]N CM Short Term Goal #2  Patient/son will be able to verbalize 2 symptoms of low blood sugar over the next 30 days   [facility]N CM Short Term Goal #2 Start Date  07/10/18  Interventions for Short Term Goal #2  review of low blood sugar readings , none identifed.       [name], RN, [place] Management Coordinator  [phone]- Mobile [phone]- Toll Free Main Office

## 2018-07-15 ENCOUNTER — Other Ambulatory Visit: Payer: Self-pay

## 2018-07-15 ENCOUNTER — Ambulatory Visit (HOSPITAL_COMMUNITY)
Admission: RE | Admit: 2018-07-15 | Discharge: 2018-07-15 | Disposition: A | Discharge status: Home / Self Care | Payer: Medicare HMO | Source: Ambulatory Visit | Attending: Nephrology | Admitting: Nephrology

## 2018-07-15 DIAGNOSIS — I776 Arteritis, unspecified: Secondary | ICD-10-CM | POA: Insufficient documentation

## 2018-07-15 MED ORDER — DIPHENHYDRAMINE HCL 50 MG/ML IJ SOLN
INTRAMUSCULAR | Status: AC
  Administered 2018-07-15: 25 mg via INTRAVENOUS
  Filled 2018-07-15: qty 1

## 2018-07-15 MED ORDER — DIPHENHYDRAMINE HCL 50 MG/ML IJ SOLN
25.0000 mg | Freq: Once | INTRAMUSCULAR | Status: DC
  Administered 2018-07-15: 25 mg via INTRAVENOUS

## 2018-07-15 MED ORDER — ACETAMINOPHEN 325 MG PO TABS
ORAL_TABLET | ORAL | Status: AC
  Administered 2018-07-15: 650 mg via ORAL
  Filled 2018-07-15: qty 2

## 2018-07-15 MED ORDER — ACETAMINOPHEN 325 MG PO TABS
650.0000 mg | ORAL_TABLET | Freq: Once | ORAL | Status: DC
  Administered 2018-07-15: 650 mg via ORAL

## 2018-07-15 MED ORDER — METHYLPREDNISOLONE SODIUM SUCC 125 MG IJ SOLR
INTRAMUSCULAR | Status: AC
  Administered 2018-07-15: 125 mg via INTRAVENOUS
  Filled 2018-07-15: qty 2

## 2018-07-15 MED ORDER — METHYLPREDNISOLONE SODIUM SUCC 125 MG IJ SOLR
125.0000 mg | Freq: Once | INTRAMUSCULAR | Status: DC
  Administered 2018-07-15: 125 mg via INTRAVENOUS

## 2018-07-15 MED ORDER — SODIUM CHLORIDE 0.9 % IV SOLN
700.0000 mg | Freq: Once | INTRAVENOUS | Status: DC
  Administered 2018-07-15: 700 mg via INTRAVENOUS
  Filled 2018-07-15: qty 70

## 2018-07-15 NOTE — Discharge Instructions (Signed)
Rituximab injection Qu es este medicamento? El RITUXIMAB es un anticuerpo monoclonal. Se utiliza para tratar ciertos tipos de cncer, tales como el linfoma no Hodgkin y la leucemia linfoctica crnica. Tambin se [place] para tratar la artritis reumatoide, la poliangitis granulomatosa (o granulomatosis de Wegener), la poliangitis microscpica y el pnfigo vulgar. Este medicamento puede ser utilizado para otros usos; si tiene alguna pregunta consulte con su proveedor de atencin mdica o con su farmacutico. MARCAS COMUNES: [name] Qu le debo informar a mi profesional de la salud antes de tomar este medicamento? Necesitan saber si usted presenta alguno de los siguientes problemas o situaciones: enfermedad cardiaca infeccin (especialmente infecciones virales, como hepatitis B, varicela, fuegos labiales o herpes) problemas del sistema inmunolgico ritmo cardiaco irregular enfermedad renal recuentos sanguneos bajos, como baja cantidad de glbulos blancos, plaquetas o glbulos rojos enfermedad pulmonar o respiratoria, como asma recientemente recibi o tiene programado recibir una vacuna una reaccin alrgica o inusual al rituximab, a otros medicamentos, alimentos, colorantes o conservantes si est embarazada o buscando quedar embarazada si est amamantando a un beb Cmo debo utilizar este medicamento? Este medicamento se administra mediante infusin por va intravenosa. Lo administra un profesional de Technical sales engineer calificado en un hospital o en un entorno clnico. Su farmacutico le dar una [place] del medicamento especial con cada receta y relleno. Asegrese de leer esta informacin cada vez cuidadosamente. Hable con su pediatra para informarse acerca del uso de este medicamento en nios. Este medicamento no est aprobado para uso en nios. Sobredosis: Pngase en contacto inmediatamente con un centro toxicolgico o una sala de urgencia si usted cree que haya tomado demasiado medicamento. ATENCIN: [employer] es solo para usted. No comparta este medicamento con nadie. Qu sucede si me olvido de una dosis? Es importante no olvidar ninguna dosis. Informe a su mdico o a su profesional de la salud si no puede asistir a Photographer. Qu puede interactuar con este medicamento? cisplatino vacunas de virus vivos Puede ser que esta lista no menciona todas las posibles interacciones. Informe a su profesional de [employer]	[place] de [employer] productos a base de hierbas, medicamentos de [place] o suplementos nutritivos que est tomando. Si usted fuma, consume bebidas alcohlicas o si utiliza drogas ilegales, indqueselo tambin a su profesional de [employer]	[place]. Algunas sustancias pueden interactuar con su medicamento. A qu debo estar atento al usar [employer]? Se supervisar su estado de salud atentamente mientras reciba este medicamento. Usted podra necesitar realizarse [employer] de sangre mientras est usando [place]. Este medicamento puede causar Chief of Staff graves. Para reducir su riesgo, es posible que necesite tomar un medicamento antes del tratamiento con este medicamento. Tome su medicamento segn las instrucciones. En algunos pacientes, este medicamento puede causar una infeccin grave en el cerebro que puede llevar a la muerte. Si tiene cualquier problema para ver, pensar, hablar, caminar o pararse, infrmeselo a su profesional de [employer]	[place] de inmediato. Si no puede ponerse en contacto con su profesional de la salud, busque otra fuente de atencin mdica de [place] urgente. Consulte a su mdico o a su profesional de la salud si tiene fiebre, escalofros o dolor de garganta, o cualquier otro sntoma de resfro o gripe. No se trate usted mismo. Este medicamento reduce la capacidad del cuerpo para combatir infecciones. Trate de no acercarse a personas que estn enfermas. No debe quedar embarazada mientras est usando este medicamento o por 12 meses despus de dejar de usarlo. Las mujeres  deben informar a su mdico si estn  buscando quedar embarazadas o si creen que podran estar embarazadas. Existe la posibilidad de efectos secundarios graves en un beb sin nacer. Para obtener ms informacin, hable con su profesional de la salud o su farmacutico. No debe amamantar a un beb mientras est tomando este medicamento o por 6 meses despus de dejar de usarlo. Qu efectos secundarios puedo tener al [employer] este medicamento? Efectos secundarios que debe informar a su mdico o a Barrister's clerk de la salud tan pronto como sea posible: Chief of Staff, como erupcin cutnea, comezn/picazn o urticaria; hinchazn de la cara, los labios o la lengua problemas respiratorios dolor en el pecho cambios en la visin diarrea dolor de cabeza con fiebre, rigidez del cuello, sensibilidad a la luz, nuseas, o confusin ritmo cardiaco rpido, irregular prdida de memoria recuentos sanguneos bajos: este medicamento podra reducir la cantidad de glbulos blancos, glbulos rojos y plaquetas. Su riesgo de infeccin y [place]. llagas en la boca problemas de equilibrio, del habla o al caminar enrojecimiento, formacin de ampollas, descamacin o distensin de la piel, incluso dentro de la boca signos de infeccin: fiebre o escalofros, tos, dolor de garganta, dolor o dificultad para orinar signos y sntomas de lesin al rin, tales como dificultad para orinar o cambios en la cantidad de orina signos y sntomas de lesin al hgado, como orina amarilla oscura o [place]; sensacin general de estar enfermo o sntomas gripales; heces claras; prdida del apetito; nuseas; dolor en la regin abdominal superior derecha; cansancio o debilidad inusual; color amarillento de los ojos o la piel signos y sntomas de presin sangunea baja, tales como [place], sensacin de [place] o aturdimiento, cadas, cansancio o debilidad inusual dolor estomacal hinchazn de tobillos, pies, manos sangrado o moretones inusuales  vmito Efectos secundarios que generalmente no requieren atencin mdica (infrmelos a su mdico o a Barrister's clerk de la salud si persisten o si son molestos): dolor de Special educational needs teacher en las articulaciones calambres o dolores musculares nuseas cansancio Puede ser que esta lista no menciona todos los posibles efectos secundarios. Comunquese a su mdico por asesoramiento mdico [employer] Usted puede informar los efectos secundarios a la FDA por telfono al 1-800-FDA-1088. Dnde debo guardar mi medicina? Este medicamento se administra en hospitales o clnicas y no necesitar guardarlo en su domicilio. ATENCIN: Este folleto es un resumen. Puede ser que no cubra toda la posible informacin. Si usted tiene preguntas acerca de esta medicina, consulte con su mdico, su farmacutico o su profesional de Technical sales engineer.  2019 Elsevier/Gold Standard (2017-04-10 00:00:00) Rituximab injection What is this medicine? RITUXIMAB (ri TUX i mab) is a monoclonal antibody. It is used to treat certain types of cancer like non-Hodgkin lymphoma and chronic lymphocytic leukemia. It is also used to treat rheumatoid arthritis, granulomatosis with polyangiitis (or Wegener's granulomatosis), microscopic polyangiitis, and pemphigus vulgaris. This medicine may be used for other purposes; ask your health care provider or pharmacist if you have questions. COMMON BRAND NAME(S): [name] What should I tell my health care provider before I take this medicine? They need to know if you have any of these conditions: -heart disease -infection (especially a virus infection such as hepatitis B, chickenpox, cold sores, or herpes) -immune system problems -irregular heartbeat -kidney disease -low blood counts, like low white cell, platelet, or red cell counts -lung or breathing disease, like asthma -recently received or scheduled to receive a vaccine -an unusual or allergic reaction to rituximab, other medicines, foods,  dyes, or preservatives -pregnant or trying to get  pregnant -breast-feeding How should I use this medicine? This medicine is for infusion into a vein. It is administered in a hospital or clinic by a specially trained health care professional. A special MedGuide will be given to you by the pharmacist with each prescription and refill. Be sure to read this information carefully each time. Talk to your pediatrician regarding the use of this medicine in children. This medicine is not approved for use in children. Overdosage: If you think you have taken too much of this medicine contact a poison control center or emergency room at once. NOTE: This medicine is only for you. Do not share this medicine with others. What if I miss a dose? It is important not to miss a dose. Call your doctor or health care professional if you are unable to keep an appointment. What may interact with this medicine? -cisplatin -live virus vaccines This list may not describe all possible interactions. Give your health care provider a list of all the medicines, herbs, non-prescription drugs, or dietary supplements you use. Also tell them if you smoke, drink alcohol, or use illegal drugs. Some items may interact with your medicine. What should I watch for while using this medicine? Your condition will be monitored carefully while you are receiving this medicine. You may need blood work done while you are taking this medicine. This medicine can cause serious allergic reactions. To reduce your risk you may need to take medicine before treatment with this medicine. Take your medicine as directed. In some patients, this medicine may cause a serious brain infection that may cause death. If you have any problems seeing, thinking, speaking, walking, or standing, tell your healthcare professional right away. If you cannot reach your healthcare professional, urgently seek other source of medical care. Call your doctor or health care  professional for advice if you get a fever, chills or sore throat, or other symptoms of a cold or flu. Do not treat yourself. This drug decreases your body's ability to fight infections. Try to avoid being around people who are sick. Do not become pregnant while taking this medicine or for 12 months after stopping it. Women should inform their doctor if they wish to become pregnant or think they might be pregnant. There is a potential for serious side effects to an unborn child. Talk to your health care professional or pharmacist for more information. Do not breast-feed an infant while taking this medicine or for 6 months after stopping it. What side effects may I notice from receiving this medicine? Side effects that you should report to your doctor or health care professional as soon as possible: -allergic reactions like skin rash, itching or hives; swelling of the face, lips, or tongue -breathing problems -chest pain -changes in vision -diarrhea -headache with fever, neck stiffness, sensitivity to light, nausea, or confusion -fast, irregular heartbeat -loss of memory -low blood counts - this medicine may decrease the number of white blood cells, red blood cells and platelets. You may be at increased risk for infections and bleeding. -mouth sores -problems with balance, talking, or walking -redness, blistering, peeling or loosening of the skin, including inside the mouth -signs of infection - fever or chills, cough, sore throat, pain or difficulty passing urine -signs and symptoms of kidney injury like trouble passing urine or change in the amount of urine -signs and symptoms of liver injury like dark yellow or brown urine; general ill feeling or flu-like symptoms; light-colored stools; loss of appetite; nausea; right upper belly pain;  unusually weak or tired; yellowing of the eyes or skin -signs and symptoms of low blood pressure like dizziness; feeling faint or lightheaded, falls; unusually  weak or tired -stomach pain -swelling of the ankles, feet, hands -unusual bleeding or bruising -vomiting Side effects that usually do not require medical attention (report to your doctor or health care professional if they continue or are bothersome): -headache -joint pain -muscle cramps or muscle pain -nausea -tiredness This list may not describe all possible side effects. Call your doctor for medical advice about side effects. You may report side effects to FDA at 1-800-FDA-1088. Where should I keep my medicine? This drug is given in a hospital or clinic and will not be stored at home. NOTE: This sheet is a summary. It may not cover all possible information. If you have questions about this medicine, talk to your doctor, pharmacist, or health care provider.  2019 Elsevier/Gold Standard (2016-12-20 13:04:32)

## 2018-07-16 DIAGNOSIS — R222 Localized swelling, mass and lump, trunk: Secondary | ICD-10-CM | POA: Diagnosis not present

## 2018-07-16 DIAGNOSIS — E669 Obesity, unspecified: Secondary | ICD-10-CM | POA: Diagnosis not present

## 2018-07-16 DIAGNOSIS — Z7951 Long term (current) use of inhaled steroids: Secondary | ICD-10-CM | POA: Diagnosis not present

## 2018-07-16 DIAGNOSIS — E119 Type 2 diabetes mellitus without complications: Secondary | ICD-10-CM | POA: Diagnosis not present

## 2018-07-16 DIAGNOSIS — S70311D Abrasion, right thigh, subsequent encounter: Secondary | ICD-10-CM | POA: Diagnosis not present

## 2018-07-16 DIAGNOSIS — R918 Other nonspecific abnormal finding of lung field: Secondary | ICD-10-CM | POA: Diagnosis not present

## 2018-07-16 DIAGNOSIS — D649 Anemia, unspecified: Secondary | ICD-10-CM | POA: Diagnosis not present

## 2018-07-16 DIAGNOSIS — Z8701 Personal history of pneumonia (recurrent): Secondary | ICD-10-CM | POA: Diagnosis not present

## 2018-07-16 DIAGNOSIS — Z7984 Long term (current) use of oral hypoglycemic drugs: Secondary | ICD-10-CM | POA: Diagnosis not present

## 2018-07-20 ENCOUNTER — Telehealth: Payer: Self-pay | Admitting: Primary Care

## 2018-07-20 ENCOUNTER — Encounter (HOSPITAL_COMMUNITY): Payer: Self-pay

## 2018-07-20 ENCOUNTER — Observation Stay (HOSPITAL_COMMUNITY)
Admission: EM | Admit: 2018-07-20 | Discharge: 2018-07-22 | Disposition: A | Discharge status: Home / Self Care | Payer: Medicare HMO | Attending: Family Medicine | Admitting: Family Medicine

## 2018-07-20 ENCOUNTER — Other Ambulatory Visit: Payer: Self-pay

## 2018-07-20 DIAGNOSIS — R918 Other nonspecific abnormal finding of lung field: Secondary | ICD-10-CM | POA: Insufficient documentation

## 2018-07-20 DIAGNOSIS — E1122 Type 2 diabetes mellitus with diabetic chronic kidney disease: Secondary | ICD-10-CM | POA: Diagnosis not present

## 2018-07-20 DIAGNOSIS — I517 Cardiomegaly: Secondary | ICD-10-CM | POA: Insufficient documentation

## 2018-07-20 DIAGNOSIS — N179 Acute kidney failure, unspecified: Secondary | ICD-10-CM | POA: Diagnosis not present

## 2018-07-20 DIAGNOSIS — N184 Chronic kidney disease, stage 4 (severe): Secondary | ICD-10-CM | POA: Insufficient documentation

## 2018-07-20 DIAGNOSIS — I776 Arteritis, unspecified: Secondary | ICD-10-CM | POA: Diagnosis not present

## 2018-07-20 DIAGNOSIS — N390 Urinary tract infection, site not specified: Secondary | ICD-10-CM | POA: Insufficient documentation

## 2018-07-20 DIAGNOSIS — Z880 Allergy status to penicillin: Secondary | ICD-10-CM | POA: Diagnosis not present

## 2018-07-20 DIAGNOSIS — E1165 Type 2 diabetes mellitus with hyperglycemia: Secondary | ICD-10-CM | POA: Insufficient documentation

## 2018-07-20 DIAGNOSIS — I959 Hypotension, unspecified: Secondary | ICD-10-CM | POA: Insufficient documentation

## 2018-07-20 DIAGNOSIS — R5383 Other fatigue: Secondary | ICD-10-CM | POA: Diagnosis not present

## 2018-07-20 DIAGNOSIS — Z1159 Encounter for screening for other viral diseases: Secondary | ICD-10-CM | POA: Diagnosis not present

## 2018-07-20 DIAGNOSIS — E876 Hypokalemia: Secondary | ICD-10-CM | POA: Insufficient documentation

## 2018-07-20 DIAGNOSIS — Z7952 Long term (current) use of systemic steroids: Secondary | ICD-10-CM | POA: Insufficient documentation

## 2018-07-20 DIAGNOSIS — N3001 Acute cystitis with hematuria: Secondary | ICD-10-CM

## 2018-07-20 DIAGNOSIS — N189 Chronic kidney disease, unspecified: Secondary | ICD-10-CM

## 2018-07-20 DIAGNOSIS — R042 Hemoptysis: Principal | ICD-10-CM | POA: Insufficient documentation

## 2018-07-20 DIAGNOSIS — Z794 Long term (current) use of insulin: Secondary | ICD-10-CM | POA: Insufficient documentation

## 2018-07-20 DIAGNOSIS — E871 Hypo-osmolality and hyponatremia: Secondary | ICD-10-CM | POA: Insufficient documentation

## 2018-07-20 DIAGNOSIS — J90 Pleural effusion, not elsewhere classified: Secondary | ICD-10-CM | POA: Diagnosis not present

## 2018-07-20 DIAGNOSIS — Z79899 Other long term (current) drug therapy: Secondary | ICD-10-CM | POA: Diagnosis not present

## 2018-07-20 DIAGNOSIS — Z20828 Contact with and (suspected) exposure to other viral communicable diseases: Secondary | ICD-10-CM | POA: Diagnosis not present

## 2018-07-20 LAB — CBG MONITORING, ED: Glucose-Capillary: 169 mg/dL — ABNORMAL HIGH (ref 70–99)

## 2018-07-20 LAB — BASIC METABOLIC PANEL
Anion gap: 12 (ref 5–15)
BUN: 107 mg/dL — ABNORMAL HIGH (ref 8–23)
CO2: 28 mmol/L (ref 22–32)
Calcium: 8.6 mg/dL — ABNORMAL LOW (ref 8.9–10.3)
Chloride: 96 mmol/L — ABNORMAL LOW (ref 98–111)
Creatinine, Ser: 2.25 mg/dL — ABNORMAL HIGH (ref 0.44–1.00)
GFR calc Af Amer: 25 mL/min — ABNORMAL LOW (ref >60)
GFR calc non Af Amer: 22 mL/min — ABNORMAL LOW (ref >60)
Glucose, Bld: 188 mg/dL — ABNORMAL HIGH (ref 70–99)
Potassium: 3.5 mmol/L (ref 3.5–5.1)
Sodium: 136 mmol/L (ref 135–145)

## 2018-07-20 LAB — CBC
HCT: 29.2 % — ABNORMAL LOW (ref 36.0–46.0)
Hemoglobin: 9.4 g/dL — ABNORMAL LOW (ref 12.0–15.0)
MCH: 27.7 pg (ref 26.0–34.0)
MCHC: 32.2 g/dL (ref 30.0–36.0)
MCV: 86.1 fL (ref 80.0–100.0)
Platelets: 251 10*3/uL (ref 150–400)
RBC: 3.39 MIL/uL — ABNORMAL LOW (ref 3.87–5.11)
RDW: 16.4 % — ABNORMAL HIGH (ref 11.5–15.5)
WBC: 16.6 10*3/uL — ABNORMAL HIGH (ref 4.0–10.5)
nRBC: 0 % (ref 0.0–0.2)

## 2018-07-20 MED ORDER — SODIUM CHLORIDE 0.9% FLUSH: 3.0000 mL | Freq: Once | INTRAVENOUS | Status: DC

## 2018-07-20 NOTE — ED Triage Notes (Signed)
Pt here for evaluation of coughing up blood today, pt had 3 episodes today. Pt denies any SOB at this time, nad noted. Recently admitted for PNA and had a lung biopsy done. Has an appt with pulmonologist tomorrow but they recommended she come here instead.

## 2018-07-20 NOTE — Telephone Encounter (Signed)
Spoke with Dr. Tobie Poet. States that the pt called her office and advised them that she is "spitting" up bright red blood. She is "filling up a paper towel." Pt has an appointment with our office on 07/21/2018 at 0900 with [place]. Advised Dr. Tobie Poet that if the pt is coughing up that much blood she should be evaluated in the ED. Dr. Tobie Poet agreed and wanted [place] to cancel the pt's appointment for tomorrow. HFU is being rescheduled by Ewell Poe. Nothing further was needed.

## 2018-07-20 NOTE — Progress Notes (Deleted)
@Patient  ID: Julia Johns, female    DOB: 02/16/1950, 68 y.o.   MRN: [id number]  No chief complaint on file.   Referring provider: [name], MD  HPI: 68 year old female,   Allergies  Allergen Reactions   Penicillins Anaphylaxis    Patient stated that within a few minutes of receiving a penicillin IM injection she rapidly became unconscious and required "additional" medication to recover. This occurred when she was ~68 years old and she does not recall what was given to assist in her recovery. She did not develop a rash.      There is no immunization history on file for this patient.  Past Medical History:  Diagnosis Date   Chronic kidney disease    Diabetes mellitus without complication ([place])     Tobacco History: Social History   Tobacco Use  Smoking Status Never Smoker  Smokeless Tobacco Never Used   Counseling given: Not Answered   Outpatient Medications Prior to Visit  Medication Sig Dispense Refill   bisacodyl (DULCOLAX) 10 MG suppository Place 1 suppository (10 mg total) rectally daily as needed for moderate constipation. (Patient not taking: Reported on 07/10/2018) 12 suppository 0   blood glucose meter kit and supplies Dispense based on patient and insurance preference. Use up to four times daily as directed. (FOR ICD-10 E10.9, E11.9). 1 each 0   furosemide (LASIX) 80 MG tablet Take 1 tablet (80 mg total) by mouth 2 (two) times daily. 60 tablet 2   glipiZIDE (GLUCOTROL) 5 MG tablet Take 0.5 tablets (2.5 mg total) by mouth 2 (two) times daily before a meal. 30 tablet 2   potassium chloride (K-DUR) 10 MEQ tablet Take 1 tablet (10 mEq total) by mouth daily. 60 tablet 0   predniSONE (DELTASONE) 20 MG tablet Take 3 tablets (60 mg total) by mouth daily with breakfast. 90 tablet 1   senna-docusate (SENOKOT-S) 8.6-50 MG tablet Take 1 tablet by mouth 2 (two) times daily. (Patient not taking: Reported on 07/10/2018)     No facility-administered  medications prior to visit.       Review of Systems  Review of Systems   Physical Exam  There were no vitals taken for this visit. Physical Exam   Lab Results:  CBC    Component Value Date/Time   WBC 15.3 (H) 07/07/2018 0553   RBC 3.33 (L) 07/07/2018 0553   HGB 9.0 (L) 07/07/2018 0553   HCT 28.0 (L) 07/07/2018 0553   PLT 518 (H) 07/07/2018 0553   MCV 84.1 07/07/2018 0553   MCH 27.0 07/07/2018 0553   MCHC 32.1 07/07/2018 0553   RDW 14.5 07/07/2018 0553   LYMPHSABS 0.7 07/07/2018 0553   MONOABS 0.5 07/07/2018 0553   EOSABS 0.0 07/07/2018 0553   BASOSABS 0.0 07/07/2018 0553    BMET    Component Value Date/Time   NA 141 07/09/2018 0526   K 3.1 (L) 07/09/2018 0526   CL 96 (L) 07/09/2018 0526   CO2 29 07/09/2018 0526   GLUCOSE 220 (H) 07/09/2018 0526   BUN 128 (H) 07/09/2018 0526   CREATININE 3.27 (H) 07/09/2018 0526   CALCIUM 8.6 (L) 07/09/2018 0526   GFRNONAA 14 (L) 07/09/2018 0526   GFRAA 16 (L) 07/09/2018 0526    BNP No results found for: BNP  ProBNP No results found for: PROBNP  Imaging: Dg Chest 2 View  Result Date: 07/05/2018 CLINICAL DATA:  Follow-up. EXAM: CHEST - 2 VIEW COMPARISON:  07/03/2018 FINDINGS: Lung volumes remain low. The  cardiomediastinal silhouette is unchanged. There is peribronchial cuffing with increased patchy opacity in the perihilar regions bilaterally. Left greater than right basilar airspace opacities have not significantly changed with masslike left basilar density on prior CT. Small pleural effusions are suspected. No pneumothorax is identified. IMPRESSION: Low lung volumes with mild worsening of bilateral lung opacities which may reflect edema or pneumonia. Electronically Signed   By: Logan Bores M.D.   On: 07/05/2018 08:07   US Renal  Result Date: 07/03/2018 CLINICAL DATA:  Acute renal failure. EXAM: RENAL / URINARY TRACT ULTRASOUND COMPLETE COMPARISON:  None. FINDINGS: Right Kidney: Renal measurements: 13.1 x 5.5 x 4.6 cm =  volume: 172 mL . Echogenicity within normal limits. No mass or hydronephrosis visualized. Left Kidney: Renal measurements: 12.9 x 7.8 x 6.0 cm = volume: 316 mL. Borderline increased parenchymal echogenicity. No mass or stone. No hydronephrosis Bladder: Appears normal for degree of bladder distention. IMPRESSION: 1. Borderline increased renal parenchymal echogenicity on the left suggesting medical renal disease. The left kidney is larger than the right. No renal masses, stones or hydronephrosis. Electronically Signed   By: Lajean Manes M.D.   On: 07/03/2018 17:52   Dg Chest Portable 1 View  Result Date: 07/03/2018 CLINICAL DATA:  Pts family states pt was discharged 1 wk ago (admitted for stomach mass and PNA). States the pt has been having dysuria x 2-3 days, also having bilateral leg swelling. Pt was told by doctor that labs yesterday reflected new onset DM and acute renal failure - sent by doc to ED for eval EXAM: PORTABLE CHEST 1 VIEW COMPARISON:  Chest CT, 06/16/2018. Prior chest radiographs, most recent dated 06/05/2018. FINDINGS: Lung volumes are low. There is opacity at both lung bases consistent with a combination of small pleural effusions and atelectasis. Pneumonia at either lung base is possible. There are prominent bronchovascular markings bilaterally, but no overt pulmonary edema. Cardiac silhouette is normal in size. No mediastinal or hilar masses. Skeletal structures are grossly intact. IMPRESSION: 1. Small effusions with bilateral lung base opacities, which are most likely due to atelectasis. Consider pneumonia if there are consistent clinical findings. Prominent bronchovascular markings accentuated by the low lung volumes, without overt pulmonary edema. Electronically Signed   By: Lajean Manes M.D.   On: 07/03/2018 15:00   Dg Swallowing Func-speech Pathology  Result Date: 06/24/2018 Objective Swallowing Evaluation: Type of Study: Bedside Swallow Evaluation  Patient Details Name: Julia Johns MRN: [id number] Date of Birth: 1950-09-23 Today's Date: 06/24/2018 Time: SLP Start Time (ACUTE ONLY): 7672 -SLP Stop Time (ACUTE ONLY): 1410 SLP Time Calculation (min) (ACUTE ONLY): 25 min Past Medical History: No past medical history on file. Past Surgical History: Past Surgical History: Procedure Laterality Date  VIDEO BRONCHOSCOPY Bilateral 06/18/2018  Procedure: VIDEO BRONCHOSCOPY WITH FLUORO;  Surgeon: Rigoberto Noel, MD;  Location: [place];  Service: Cardiopulmonary;  Laterality: Bilateral; HPI: This is a 68 year old female who presented as a transfer from [facility] with complaints of shortness of breath and blood-tinged sputum.  Patient underwent CT imaging which revealed a paraspinal soft tissue thickening from T6-T10 as well as a left lower lobe density within the CT which was enlarged from prior imaging concerning for an inflammatory/infectious etiology. Bronchoscopy 5/28. No culture growth. Cytology with atypical cells. No malignancy identified. TB- AFBs neg x 2, Quantiferon TB indeterminate, Covid negative. MD ordered MBS for objective assessment of swallow function.  No data recorded Assessment / Plan / Recommendation CHL IP CLINICAL IMPRESSIONS 06/24/2018 Clinical Impression  Pt demonstrates normal swallow function. She could not swallow a barium tablet, could not orally transit despite liquid and puree boluses given, said it was too large. No SLP f/u needed. Continue current diet.  SLP Visit Diagnosis Dysphagia, unspecified (R13.10) Attention and concentration deficit following -- Frontal lobe and executive function deficit following -- Impact on safety and function Mild aspiration risk   CHL IP TREATMENT RECOMMENDATION 06/24/2018 Treatment Recommendations No treatment recommended at this time   No flowsheet data found. CHL IP DIET RECOMMENDATION 06/24/2018 SLP Diet Recommendations Regular solids;Thin liquid Liquid Administration via Cup;Straw Medication Administration Whole meds  with liquid Compensations -- Postural Changes --   No flowsheet data found.  No flowsheet data found.  No flowsheet data found.     CHL IP ORAL PHASE 06/24/2018 Oral Phase WFL Oral - Pudding Teaspoon -- Oral - Pudding Cup -- Oral - Honey Teaspoon -- Oral - Honey Cup -- Oral - Nectar Teaspoon -- Oral - Nectar Cup -- Oral - Nectar Straw -- Oral - Thin Teaspoon -- Oral - Thin Cup -- Oral - Thin Straw -- Oral - Puree -- Oral - Mech Soft -- Oral - Regular -- Oral - Multi-Consistency -- Oral - Pill -- Oral Phase - Comment --  CHL IP PHARYNGEAL PHASE 06/24/2018 Pharyngeal Phase WFL Pharyngeal- Pudding Teaspoon -- Pharyngeal -- Pharyngeal- Pudding Cup -- Pharyngeal -- Pharyngeal- Honey Teaspoon -- Pharyngeal -- Pharyngeal- Honey Cup -- Pharyngeal -- Pharyngeal- Nectar Teaspoon -- Pharyngeal -- Pharyngeal- Nectar Cup -- Pharyngeal -- Pharyngeal- Nectar Straw -- Pharyngeal -- Pharyngeal- Thin Teaspoon -- Pharyngeal -- Pharyngeal- Thin Cup -- Pharyngeal -- Pharyngeal- Thin Straw -- Pharyngeal -- Pharyngeal- Puree -- Pharyngeal -- Pharyngeal- Mechanical Soft -- Pharyngeal -- Pharyngeal- Regular -- Pharyngeal -- Pharyngeal- Multi-consistency -- Pharyngeal -- Pharyngeal- Pill -- Pharyngeal -- Pharyngeal Comment --  No flowsheet data found. Herbie Baltimore, MA CCC-SLP Acute Rehabilitation Services Pager [phone] Office [phone] Lynann Beaver 06/24/2018, 2:48 PM              US Biopsy (kidney)  Result Date: 07/06/2018 INDICATION: 60 year old with acute kidney injury.  Request for renal biopsy. EXAM: ULTRASOUND-GUIDED RIGHT RENAL BIOPSY MEDICATIONS: None. ANESTHESIA/SEDATION: Moderate (conscious) sedation was employed during this procedure. A total of Versed 1.0 mg and Fentanyl 25 mcg was administered intravenously. Moderate Sedation Time: 16 minutes. The patient's level of consciousness and vital signs were monitored continuously by radiology nursing throughout the procedure under my direct supervision. FLUOROSCOPY  TIME:  None COMPLICATIONS: None immediate. PROCEDURE: Informed written consent was obtained from the patient with a Spanish translator after a thorough discussion of the procedural risks, benefits and alternatives. All questions were addressed. Maximal Sterile Barrier Technique was utilized including caps, mask, sterile gowns, sterile gloves, sterile drape, hand hygiene and skin antiseptic. A timeout was performed prior to the initiation of the procedure. Patient was placed prone and both kidneys were evaluated with ultrasound. The right kidney lower pole was targeted for biopsy. Right flank was prepped with chlorhexidine and sterile field was created. Skin and soft tissues were anesthetized with 1% lidocaine. Using ultrasound guidance, 16 gauge core biopsy needle was directed into the right kidney lower pole and a biopsy was obtained. Specimen placed in saline. A second core biopsy was obtained with ultrasound guidance. Two adequate core specimens were obtained. Follow-up ultrasound imaging was performed. Bandage placed over the puncture site. FINDINGS: Bilateral echogenic kidneys. Negative for hydronephrosis. Two adequate core biopsies obtained from the right kidney lower pole. No significant bleeding  or hematoma formation following the core biopsies. IMPRESSION: Ultrasound-guided core biopsy of the right kidney lower pole. Electronically Signed   By: Markus Daft M.D.   On: 07/06/2018 15:24     Assessment & Plan:   No problem-specific Assessment & Plan notes found for this encounter.     Martyn Ehrich, NP 07/20/2018

## 2018-07-21 ENCOUNTER — Inpatient Hospital Stay: Payer: Medicare HMO | Admitting: Primary Care

## 2018-07-21 ENCOUNTER — Emergency Department (HOSPITAL_COMMUNITY): Payer: Medicare HMO

## 2018-07-21 ENCOUNTER — Encounter (HOSPITAL_COMMUNITY): Payer: Self-pay | Admitting: *Deleted

## 2018-07-21 DIAGNOSIS — Z7951 Long term (current) use of inhaled steroids: Secondary | ICD-10-CM | POA: Diagnosis not present

## 2018-07-21 DIAGNOSIS — R042 Hemoptysis: Secondary | ICD-10-CM | POA: Diagnosis not present

## 2018-07-21 DIAGNOSIS — D649 Anemia, unspecified: Secondary | ICD-10-CM | POA: Diagnosis not present

## 2018-07-21 DIAGNOSIS — Z7984 Long term (current) use of oral hypoglycemic drugs: Secondary | ICD-10-CM | POA: Diagnosis not present

## 2018-07-21 DIAGNOSIS — E119 Type 2 diabetes mellitus without complications: Secondary | ICD-10-CM | POA: Diagnosis not present

## 2018-07-21 DIAGNOSIS — R918 Other nonspecific abnormal finding of lung field: Secondary | ICD-10-CM | POA: Diagnosis not present

## 2018-07-21 DIAGNOSIS — N189 Chronic kidney disease, unspecified: Secondary | ICD-10-CM

## 2018-07-21 DIAGNOSIS — S70311D Abrasion, right thigh, subsequent encounter: Secondary | ICD-10-CM | POA: Diagnosis not present

## 2018-07-21 DIAGNOSIS — J90 Pleural effusion, not elsewhere classified: Secondary | ICD-10-CM | POA: Diagnosis not present

## 2018-07-21 DIAGNOSIS — N390 Urinary tract infection, site not specified: Secondary | ICD-10-CM

## 2018-07-21 DIAGNOSIS — N184 Chronic kidney disease, stage 4 (severe): Secondary | ICD-10-CM | POA: Diagnosis not present

## 2018-07-21 DIAGNOSIS — N179 Acute kidney failure, unspecified: Secondary | ICD-10-CM | POA: Diagnosis not present

## 2018-07-21 DIAGNOSIS — I517 Cardiomegaly: Secondary | ICD-10-CM | POA: Diagnosis not present

## 2018-07-21 DIAGNOSIS — I959 Hypotension, unspecified: Secondary | ICD-10-CM | POA: Diagnosis not present

## 2018-07-21 DIAGNOSIS — Z8701 Personal history of pneumonia (recurrent): Secondary | ICD-10-CM | POA: Diagnosis not present

## 2018-07-21 DIAGNOSIS — E1122 Type 2 diabetes mellitus with diabetic chronic kidney disease: Secondary | ICD-10-CM | POA: Diagnosis not present

## 2018-07-21 DIAGNOSIS — Z1159 Encounter for screening for other viral diseases: Secondary | ICD-10-CM | POA: Diagnosis not present

## 2018-07-21 DIAGNOSIS — R222 Localized swelling, mass and lump, trunk: Secondary | ICD-10-CM | POA: Diagnosis not present

## 2018-07-21 DIAGNOSIS — E669 Obesity, unspecified: Secondary | ICD-10-CM | POA: Diagnosis not present

## 2018-07-21 LAB — BASIC METABOLIC PANEL
Anion gap: 10 (ref 5–15)
BUN: 88 mg/dL — ABNORMAL HIGH (ref 8–23)
CO2: 25 mmol/L (ref 22–32)
Calcium: 7.6 mg/dL — ABNORMAL LOW (ref 8.9–10.3)
Chloride: 106 mmol/L (ref 98–111)
Creatinine, Ser: 1.82 mg/dL — ABNORMAL HIGH (ref 0.44–1.00)
GFR calc Af Amer: 33 mL/min — ABNORMAL LOW (ref >60)
GFR calc non Af Amer: 28 mL/min — ABNORMAL LOW (ref >60)
Glucose, Bld: 81 mg/dL (ref 70–99)
Potassium: 2.7 mmol/L — CL (ref 3.5–5.1)
Sodium: 141 mmol/L (ref 135–145)

## 2018-07-21 LAB — URINALYSIS, ROUTINE W REFLEX MICROSCOPIC
Bilirubin Urine: NEGATIVE
Glucose, UA: 50 mg/dL — AB
Ketones, ur: NEGATIVE mg/dL
Nitrite: POSITIVE — AB
Protein, ur: 100 mg/dL — AB
RBC / HPF: >50 RBC/hpf — ABNORMAL HIGH (ref 0–5)
Specific Gravity, Urine: 1.01 (ref 1.005–1.030)
pH: 5 (ref 5.0–8.0)

## 2018-07-21 LAB — LACTIC ACID, PLASMA: Lactic Acid, Venous: 0.9 mmol/L (ref 0.5–1.9)

## 2018-07-21 LAB — CBC
HCT: 24.9 % — ABNORMAL LOW (ref 36.0–46.0)
Hemoglobin: 8.1 g/dL — ABNORMAL LOW (ref 12.0–15.0)
MCH: 28 pg (ref 26.0–34.0)
MCHC: 32.5 g/dL (ref 30.0–36.0)
MCV: 86.2 fL (ref 80.0–100.0)
Platelets: 209 10*3/uL (ref 150–400)
RBC: 2.89 MIL/uL — ABNORMAL LOW (ref 3.87–5.11)
RDW: 16.4 % — ABNORMAL HIGH (ref 11.5–15.5)
WBC: 13.9 10*3/uL — ABNORMAL HIGH (ref 4.0–10.5)
nRBC: 0 % (ref 0.0–0.2)

## 2018-07-21 LAB — TYPE AND SCREEN
ABO/RH(D): O POS
Antibody Screen: NEGATIVE

## 2018-07-21 LAB — GLUCOSE, CAPILLARY
Glucose-Capillary: 72 mg/dL (ref 70–99)
Glucose-Capillary: 165 mg/dL — ABNORMAL HIGH (ref 70–99)
Glucose-Capillary: 77 mg/dL (ref 70–99)
Glucose-Capillary: 146 mg/dL — ABNORMAL HIGH (ref 70–99)

## 2018-07-21 LAB — MAGNESIUM: Magnesium: 1.7 mg/dL (ref 1.7–2.4)

## 2018-07-21 LAB — BRAIN NATRIURETIC PEPTIDE: B Natriuretic Peptide: 344.1 pg/mL — ABNORMAL HIGH (ref 0.0–100.0)

## 2018-07-21 LAB — SARS CORONAVIRUS 2 BY RT PCR (HOSPITAL ORDER, PERFORMED IN ~~LOC~~ HOSPITAL LAB): SARS Coronavirus 2: NEGATIVE

## 2018-07-21 LAB — FUNGUS CULTURE RESULT

## 2018-07-21 LAB — FUNGUS CULTURE WITH STAIN

## 2018-07-21 LAB — ABO/RH: ABO/RH(D): O POS

## 2018-07-21 LAB — FUNGAL ORGANISM REFLEX

## 2018-07-21 LAB — PROCALCITONIN: Procalcitonin: 0.39 ng/mL

## 2018-07-21 LAB — TROPONIN I (HIGH SENSITIVITY): Troponin I (High Sensitivity): 8 ng/L (ref <18)

## 2018-07-21 MED ORDER — POTASSIUM CHLORIDE IN NACL 40-0.9 MEQ/L-% IV SOLN
INTRAVENOUS | Status: DC
  Administered 2018-07-21 – 2018-07-22 (×3): 100 mL/h via INTRAVENOUS
  Filled 2018-07-21 (×4): qty 1000

## 2018-07-21 MED ORDER — SODIUM CHLORIDE 0.9 % IV BOLUS (SEPSIS)
1000.0000 mL | Freq: Once | INTRAVENOUS | Status: AC
  Administered 2018-07-21: 1000 mL via INTRAVENOUS

## 2018-07-21 MED ORDER — ACETAMINOPHEN 650 MG RE SUPP: 650.0000 mg | Freq: Four times a day (QID) | RECTAL | Status: DC | PRN

## 2018-07-21 MED ORDER — SODIUM CHLORIDE 0.9 % IV SOLN: INTRAVENOUS | Status: DC

## 2018-07-21 MED ORDER — LEVOFLOXACIN IN D5W 750 MG/150ML IV SOLN
750.0000 mg | Freq: Once | INTRAVENOUS | Status: AC
  Administered 2018-07-21: 750 mg via INTRAVENOUS
  Filled 2018-07-21: qty 150

## 2018-07-21 MED ORDER — ACETAMINOPHEN 325 MG PO TABS: 650.0000 mg | ORAL_TABLET | Freq: Four times a day (QID) | ORAL | Status: DC | PRN

## 2018-07-21 MED ORDER — SODIUM CHLORIDE 0.9 % IV BOLUS (SEPSIS)
500.0000 mL | Freq: Once | INTRAVENOUS | Status: AC
  Administered 2018-07-21: 500 mL via INTRAVENOUS

## 2018-07-21 MED ORDER — INSULIN ASPART 100 UNIT/ML ~~LOC~~ SOLN
0.0000 [IU] | Freq: Three times a day (TID) | SUBCUTANEOUS | Status: DC
  Administered 2018-07-21: 2 [IU] via SUBCUTANEOUS
  Administered 2018-07-22: 1 [IU] via SUBCUTANEOUS

## 2018-07-21 MED ORDER — LEVOFLOXACIN IN D5W 500 MG/100ML IV SOLN: 500.0000 mg | INTRAVENOUS | Status: DC

## 2018-07-21 MED ORDER — PREDNISONE 50 MG PO TABS
60.0000 mg | ORAL_TABLET | Freq: Every day | ORAL | Status: DC
  Administered 2018-07-22: 60 mg via ORAL
  Filled 2018-07-21: qty 1

## 2018-07-21 MED ORDER — PREDNISONE 50 MG PO TABS
60.0000 mg | ORAL_TABLET | Freq: Every day | ORAL | Status: AC
  Administered 2018-07-21: 60 mg via ORAL
  Filled 2018-07-21: qty 1

## 2018-07-21 NOTE — Progress Notes (Signed)
CRITICAL VALUE ALERT  Critical Value: K+: 2.7  Date & Time Notied: 07/21/18 8250  Provider Notified: Verlon Au, MD

## 2018-07-21 NOTE — Progress Notes (Signed)
Patient's son called this pm for update.

## 2018-07-21 NOTE — Progress Notes (Addendum)
Patient examined seen and reviewed 68 year old [place] female recent diagnosis of ANCA vasculitis unclear flavor-known also to have lung mass with hemoptysis-pathology on last admission was negative for malignancy overtly but did show atypia Presented with hypotension volume depletion and likely urinary infection now on Levaquin  Seem to improve fairly well over the course of ED visit with bolus IV fluids some question of whether she has actually had further hemoptysis so observing overnight again tonight, continuing IV saline 100 cc/h With 40 of K as she was quite severely hyponatremic at 2.7  We will touch base with family in a.m. regarding planning and ensuring that she understands clearly help her take her meds Probably will not be sending her home on high-dose Lasix  Discussed with Dr. Carolin Sicks of nephrology who reviewed the patient and felt no need at this time for immunosuppressants in hospital-- continue prednisone and outpatient infusion probably in am Will call family and update later today  Verneita Griffes, MD [facility] 1:40 PM

## 2018-07-21 NOTE — ED Provider Notes (Signed)
TIME SEEN: 12:02 AM  CHIEF COMPLAINT: Lethargy, decreased oral intake, hemoptysis  HPI: Patient is a 68 y.o. F with h/o IDDM, CKD who presents to ED with 5 days of hemoptysis the past 5 days.  Patient reports that she is coughing up spoonfuls of blood with clots intermittently.  No chest pain or shortness of breath.  Does not wear oxygen chronically.  Recently admitted to the hospital for hemoptysis and had extensive work-up.  Per hospitalist note - "Extensive work-up in house revealed 4 x 3 cm LLL mass-like consolidation as well as paraspinal fluid collection.  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  Patient underwent a bronchoscopy on 5/28 and repeat bronchoscopy on 6/4 with inconclusive pathology findings but no clear evidence of malignancy."  Patient had been doing well at home but then had a recent admission for acute renal failure.  Patient's family states over the past several days she has not been eating and drinking well and has been lethargic.  They state that her urine has appeared very dark.  No vomiting or diarrhea.  No known fevers.  Spanish interpreter used.  DIL [name] - [phone] Son [name] - [phone]   CTA chest 06/16/2018:  IMPRESSION: 1. No demonstrable pulmonary embolus. No thoracic aortic aneurysm or dissection.  2. Fairly small right pleural effusions, slightly larger than on recent studies. Multifocal airspace consolidation, progressed. Suspect consolidation surrounding mass in left lower lobe posteriorly.  3.  Subcentimeter lymph nodes without frank adenopathy.  4. Enlarged left lobe of thyroid with dominant thyroid mass measuring 2.8 x 2.7 cm in the left lobe. It may be prudent to correlate with ultrasound given dominant thyroid mass on the left.  5. The paraspinous lesion from T6-T10 noted on recent studies  is better seen on recent thoracic MR and chest CT not bolus timed to optimize arterial vascular visualization. Neoplastic etiology in this paraspinous lesion must be of concern as noted recent thoracic MR.  6. Mild reflux of contrast into the inferior vena cava and hepatic veins may indicate a degree of increase in right heart pressure.  7.  Hepatic steatosis.   ROS: See HPI Constitutional: no fever  Eyes: no drainage  ENT: no runny nose   Cardiovascular:  no chest pain  Resp: no SOB  GI: no vomiting GU: no dysuria Integumentary: no rash  Allergy: no hives  Musculoskeletal: no leg swelling  Neurological: no slurred speech ROS otherwise negative  PAST MEDICAL HISTORY/PAST SURGICAL HISTORY:  Past Medical History:  Diagnosis Date  . Chronic kidney disease   . Diabetes mellitus without complication ([place])     MEDICATIONS:  Prior to Admission medications   Medication Sig Start Date End Date Taking? Authorizing Provider  bisacodyl (DULCOLAX) 10 MG suppository Place 1 suppository (10 mg total) rectally daily as needed for moderate constipation. Patient not taking: Reported on 07/10/2018 06/26/18   Georgette Shell, MD  blood glucose meter kit and supplies Dispense based on patient and insurance preference. Use up to four times daily as directed. (FOR ICD-10 E10.9, E11.9). 07/09/18   Georgette Shell, MD  furosemide (LASIX) 80 MG tablet Take 1 tablet (80 mg total) by mouth 2 (two) times daily. 07/09/18   Georgette Shell, MD  glipiZIDE (GLUCOTROL) 5 MG tablet Take 0.5 tablets (2.5 mg total) by mouth 2 (two) times daily before a  meal. 07/09/18 07/09/19  Georgette Shell, MD  potassium chloride (K-DUR) 10 MEQ tablet Take 1 tablet (10 mEq total) by mouth daily. 07/09/18   Georgette Shell, MD  predniSONE (DELTASONE) 20 MG tablet Take 3 tablets (60 mg total) by mouth daily with breakfast. 07/10/18   Georgette Shell, MD  senna-docusate (SENOKOT-S) 8.6-50 MG tablet Take  1 tablet by mouth 2 (two) times daily. Patient not taking: Reported on 07/10/2018 06/26/18   Georgette Shell, MD    ALLERGIES:  Allergies  Allergen Reactions  . Penicillins Anaphylaxis    Patient stated that within a few minutes of receiving a penicillin IM injection she rapidly became unconscious and required "additional" medication to recover. This occurred when she was ~68 years old and she does not recall what was given to assist in her recovery. She did not develop a rash.     SOCIAL HISTORY:  Social History   Tobacco Use  . Smoking status: Never Smoker  . Smokeless tobacco: Never Used  Substance Use Topics  . Alcohol use: Never    Frequency: Never    FAMILY HISTORY: No family history on file.  EXAM: BP (!) 110/49   Pulse 60   Temp 98.7 F (37.1 C)   Resp 17   SpO2 95%  CONSTITUTIONAL: Alert and oriented and responds appropriately to questions.  Chronically ill-appearing, in no distress HEAD: Normocephalic EYES: Conjunctivae clear, pupils appear equal, EOMI ENT: normal nose; dry mucous membranes;, no lesions noted within her mouth, no active bleeding from her gums, she has no teeth, tongue appears normal without lesions NECK: Supple, no meningismus, no nuchal rigidity, no LAD; no JVD CARD: RRR; S1 and S2 appreciated; no murmurs, no clicks, no rubs, no gallops RESP: Normal chest excursion without splinting or tachypnea; breath sounds clear and equal bilaterally; no wheezes, no rhonchi, no rales, no hypoxia or respiratory distress, speaking full sentences ABD/GI: Normal bowel sounds; non-distended; soft, non-tender, no rebound, no guarding, no peritoneal signs, no hepatosplenomegaly BACK:  The back appears normal and is non-tender to palpation, there is no CVA tenderness EXT: Normal ROM in all joints; non-tender to palpation; no edema; normal capillary refill; no cyanosis, no calf tenderness or swelling    SKIN: Normal color for age and race; warm; no rash NEURO: Moves  all extremities equally PSYCH: The patient's mood and manner are appropriate. Grooming and personal hygiene are appropriate.  MEDICAL DECISION MAKING: Patient here with hemoptysis.  Initially patient very hypotensive here.  Improving with IV hydration.  Her lungs are clear without hypoxia.  She has had previous CT of her chest in May that showed consolidation in the left lower lobe but no pulmonary embolus.  She has had bronchoscopy x2 without clear evidence of malignancy.  Labs show anemia which appears stable.  She has a creatinine of 2.25 this appears to be improving.  Her blood pressure here is improving with IV hydration.  She has been on furosemide 80 mg twice daily.  She appears dry on exam.   ED PROGRESS: Patient's urine appears infected.  I am concerned for possible sepsis.  She has received 30 mL/kg IV fluid bolus.  Her chest x-ray shows similar pattern of hazy bilateral airspace opacities with small bilateral pleural effusions.  Clinically she appears dry on exam and not volume overloaded.  She has no JVD or peripheral edema.  Her lungs are clear to auscultation.  Her blood pressure is fluid responsive.  I suspect that her hypotension is  actually from being dry from being on Lasix 80 mg twice daily versus urosepsis.  Her COVID test is pending.  She has anaphylactic reaction to penicillins and family is unsure if she can take cephalosporins.  Will give IV Levaquin for possible healthcare associated pneumonia but also to treat her UTI.  Will discuss with medicine for admission.   2:49 AM Discussed patient's case with hospitalist, Dr. Marlowe Sax.  I have recommended admission and patient (and family if present) agree with this plan. Admitting physician will place admission orders.   I reviewed all nursing notes, vitals, pertinent previous records, EKGs, lab and urine results, imaging (as available).     EKG Interpretation  Date/Time:  Monday July 20 2018 21:31:01 EDT Ventricular Rate:  57 PR  Interval:    QRS Duration: 109 QT Interval:  416 QTC Calculation: 405 R Axis:   -8 Text Interpretation:  Sinus rhythm Low voltage, precordial leads Abnormal R-wave progression, late transition Nonspecific T abnormalities, diffuse leads similar to prior 6/20 Confirmed by Aletta Edouard [phone]) on 07/20/2018 10:42:46 PM         CRITICAL CARE Performed by: Cyril Mourning Ward   Total critical care time: 55 minutes  Critical care time was exclusive of separately billable procedures and treating other patients.  Critical care was necessary to treat or prevent imminent or life-threatening deterioration.  Critical care was time spent personally by me on the following activities: development of treatment plan with patient and/or surrogate as well as nursing, discussions with consultants, evaluation of patient's response to treatment, examination of patient, obtaining history from patient or surrogate, ordering and performing treatments and interventions, ordering and review of laboratory studies, ordering and review of radiographic studies, pulse oximetry and re-evaluation of patient's condition.      Ward, Delice Bison, DO 07/21/18 [phone]

## 2018-07-21 NOTE — ED Notes (Signed)
ED TO INPATIENT HANDOFF REPORT  ED Nurse Name and Phone #: Tray [place], [phone]  S Name/Age/Gender Julia Johns 68 y.o. female Room/Bed: 033C/033C  Code Status   Code Status: Prior  Home/SNF/Other Home Patient oriented to: self and place Is this baseline? Yes   Triage Complete: Triage complete  Chief Complaint Cough up blood  Triage Note Pt here for evaluation of coughing up blood today, pt had 3 episodes today. Pt denies any SOB at this time, nad noted. Recently admitted for PNA and had a lung biopsy done. Has an appt with pulmonologist tomorrow but they recommended she come here instead.    Allergies Allergies  Allergen Reactions  . Penicillins Anaphylaxis    Patient stated that within a few minutes of receiving a penicillin IM injection she rapidly became unconscious and required "additional" medication to recover. This occurred when she was ~68 years old and she does not recall what was given to assist in her recovery. She did not develop a rash.     Level of Care/Admitting Diagnosis ED Disposition    ED Disposition Condition Comment   Admit  Hospital Area: [facility] [[id number]]  Level of Care: Telemetry Medical [104]  I expect the patient will be discharged within 24 hours: No (not a candidate for 5C-Observation unit)  Covid Evaluation: Screening Protocol (No Symptoms)  Diagnosis: Hemoptysis [500938]  Admitting Physician: Shela Leff [[id number]]  Attending Physician: Shela Leff [[id number]]  PT Class (Do Not Modify): Observation [104]  PT Acc Code (Do Not Modify): Observation [10022]       B Medical/Surgery History Past Medical History:  Diagnosis Date  . Chronic kidney disease   . Diabetes mellitus without complication [facility])    Past Surgical History:  Procedure Laterality Date  . VIDEO BRONCHOSCOPY Bilateral 06/18/2018   Procedure: VIDEO BRONCHOSCOPY WITH FLUORO;  Surgeon: Rigoberto Noel, MD;  Location: [place] [place];  Service: Cardiopulmonary;  Laterality: Bilateral;  . VIDEO BRONCHOSCOPY WITH ENDOBRONCHIAL ULTRASOUND N/A 06/25/2018   Procedure: VIDEO BRONCHOSCOPY WITH ENDOBRONCHIAL ULTRASOUND;  Surgeon: Rigoberto Noel, MD;  Location: [facility] OR;  Service: Thoracic;  Laterality: N/A;     A IV Location/Drains/Wounds Patient Lines/Drains/Airways Status   Active Line/Drains/Airways    Name:   Placement date:   Placement time:   Site:   Days:   Peripheral IV 07/20/18 Left Antecubital   07/20/18    2218    Antecubital   1   Incision (Closed) 07/06/18 Back Right;Lower   07/06/18    1224     15   Incision (Closed) 07/06/18 Back Right   07/06/18    1250     15   Wound / Incision (Open or Dehisced) 07/03/18 Thigh Right full thickness wound   07/03/18    1821    Thigh   18   Wound / Incision (Open or Dehisced) 07/03/18 Incision - Open Foot Anterior;Right stage 2 open wound?   07/03/18    1821    Foot   18   Wound / Incision (Open or Dehisced) 07/03/18 Incision - Open Anterior;Left stage 2   07/03/18    1822    -   18   Wound / Incision (Open or Dehisced) 07/03/18 Ankle Left;Medial open wound pt states from having legs wrapped in hospital   07/03/18    1830    Ankle   18          Intake/Output Last 24 hours No intake or output data  in the 24 hours ending 07/21/18 0357  Labs/Imaging Results for orders placed or performed during the hospital encounter of 07/20/18 (from the past 48 hour(s))  Basic metabolic panel     Status: Abnormal   Collection Time: 07/20/18  7:07 PM  Result Value Ref Range   Sodium 136 135 - 145 mmol/L   Potassium 3.5 3.5 - 5.1 mmol/L   Chloride 96 (L) 98 - 111 mmol/L   CO2 28 22 - 32 mmol/L   Glucose, Bld 188 (H) 70 - 99 mg/dL   BUN 107 (H) 8 - 23 mg/dL   Creatinine, Ser 2.25 (H) 0.44 - 1.00 mg/dL   Calcium 8.6 (L) 8.9 - 10.3 mg/dL   GFR calc non Af Amer 22 (L) >60 mL/min   GFR calc Af Amer 25 (L) >60 mL/min   Anion gap 12 5 - 15    Comment: Performed at [facility], [address] [place], [place] [address]  CBC     Status: Abnormal   Collection Time: 07/20/18  7:07 PM  Result Value Ref Range   WBC 16.6 (H) 4.0 - 10.5 K/uL   RBC 3.39 (L) 3.87 - 5.11 MIL/uL   Hemoglobin 9.4 (L) 12.0 - 15.0 g/dL   HCT 29.2 (L) 36.0 - 46.0 %   MCV 86.1 80.0 - 100.0 fL   MCH 27.7 26.0 - 34.0 pg   MCHC 32.2 30.0 - 36.0 g/dL   RDW 16.4 (H) 11.5 - 15.5 %   Platelets 251 150 - 400 K/uL   nRBC 0.0 0.0 - 0.2 %    Comment: Performed at [facility] [address], [place], [place] [address]  CBG monitoring, ED     Status: Abnormal   Collection Time: 07/20/18  9:34 PM  Result Value Ref Range   Glucose-Capillary 169 (H) 70 - 99 mg/dL  Urinalysis, Routine w reflex microscopic     Status: Abnormal   Collection Time: 07/20/18 11:44 PM  Result Value Ref Range   Color, Urine YELLOW YELLOW   APPearance HAZY (A) CLEAR   Specific Gravity, Urine 1.010 1.005 - 1.030   pH 5.0 5.0 - 8.0   Glucose, UA 50 (A) NEGATIVE mg/dL   Hgb urine dipstick LARGE (A) NEGATIVE   Bilirubin Urine NEGATIVE NEGATIVE   Ketones, ur NEGATIVE NEGATIVE mg/dL   Protein, ur 100 (A) NEGATIVE mg/dL   Nitrite POSITIVE (A) NEGATIVE   Leukocytes,Ua TRACE (A) NEGATIVE   RBC / HPF >50 (H) 0 - 5 RBC/hpf   WBC, UA 11-20 0 - 5 WBC/hpf   Bacteria, UA MANY (A) NONE SEEN    Comment: Performed at [facility], [address] [place], [place] [address]  Lactic acid, plasma     Status: None   Collection Time: 07/21/18 12:36 AM  Result Value Ref Range   Lactic Acid, Venous 0.9 0.5 - 1.9 mmol/L    Comment: Performed at [facility] [address], [place], [place] [address]   Dg Chest 2 View  Result Date: 07/21/2018 CLINICAL DATA:  Hemoptysis EXAM: CHEST - 2 VIEW COMPARISON:  July 05, 2018 FINDINGS: The cardiac silhouette is enlarged. Again noted are bilateral pleural effusions. The lung volumes are low. Hazy bilateral airspace opacities are again noted. There is no pneumothorax. There is no  definite acute osseous abnormality. IMPRESSION: 1. Cardiomegaly with low lung volumes. 2. Again noted are findings suspicious for pulmonary edema. 3. Small bilateral pleural effusions. 4. Bibasilar  airspace opacities which may represent atelectasis or infiltrate. Electronically Signed   By: Constance Holster M.D.   On: 07/21/2018 00:31    Pending Labs Unresulted Labs (From admission, onward)    Start     Ordered   07/21/18 0011  Blood culture (routine x 2)  BLOOD CULTURE X 2,   STAT     07/21/18 0010   07/21/18 0011  Urine culture  ONCE - STAT,   STAT     07/21/18 0010   07/21/18 0010  SARS Coronavirus 2 (CEPHEID - Performed in [facility] hospital lab), Hosp Order  (Asymptomatic Patients Labs)  ONCE - STAT,   STAT    Question:  Rule Out  Answer:  Yes   07/21/18 0009          Vitals/Pain Today's Vitals   07/21/18 0202 07/21/18 0245 07/21/18 0300 07/21/18 0315  BP:  104/76 113/63 106/90  Pulse:  (!) 57 63 63  Resp:  18 (!) 21 16  Temp: 97.6 F (36.4 C)     TempSrc: Rectal     SpO2:  94% 94% 94%  PainSc:        Isolation Precautions No active isolations  Medications Medications  sodium chloride flush (NS) 0.9 % injection 3 mL (has no administration in time range)  levofloxacin (LEVAQUIN) IVPB 750 mg (750 mg Intravenous New Bag/Given 07/21/18 0241)  levofloxacin (LEVAQUIN) IVPB 500 mg (has no administration in time range)  sodium chloride 0.9 % bolus 1,000 mL (0 mLs Intravenous Stopped 07/21/18 0242)  sodium chloride 0.9 % bolus 500 mL (0 mLs Intravenous Stopped 07/21/18 0242)    Mobility walks with device Moderate fall risk   Focused Assessments GI   R Recommendations: See Admitting Provider Note  Report given to:   Additional Notes: Pt has Levaquin running

## 2018-07-21 NOTE — Progress Notes (Signed)
Pharmacy Antibiotic Note  Julia Johns is a 68 y.o. female admitted on 07/20/2018 with UTI.  Pharmacy has been consulted for levaquin dosing.Levaquin 750 mg IV ordered in ED  Plan: Continue levaquin 500 mg IV q48 hours F/u clinical course and cultures     Temp (24hrs), Avg:98.7 F (37.1 C), Min:98.7 F (37.1 C), Max:98.7 F (37.1 C)  Recent Labs  Lab 07/20/18 1907  WBC 16.6*  CREATININE 2.25*    Estimated Creatinine Clearance: 22.5 mL/min (A) (by C-G formula based on SCr of 2.25 mg/dL (H)).    Allergies  Allergen Reactions  . Penicillins Anaphylaxis    Patient stated that within a few minutes of receiving a penicillin IM injection she rapidly became unconscious and required "additional" medication to recover. This occurred when she was ~68 years old and she does not recall what was given to assist in her recovery. She did not develop a rash.      Thank you for allowing pharmacy to be a part of this patient's care.  Shakeyla Giebler, [place] 07/21/2018 12:23 AM

## 2018-07-21 NOTE — Care Management Obs Status (Signed)
[place] NOTIFICATION   Patient Details  Name: Julia Johns MRN: [id number] Date of Birth: 28-Aug-1950   Medicare Observation Status Notification Given:  Yes    Zenon Mayo, RN 07/21/2018, 3:47 PM

## 2018-07-21 NOTE — Progress Notes (Addendum)
Called son, [name], to update plan of care. Son states that pt has decreased appetite at home and worries that pt might be depressed. Answered all questions.   Paulla Fore, RN

## 2018-07-21 NOTE — H&P (Signed)
History and Physical    Julia Johns LEX:[id number] DOB: 1950/02/14 DOA: 07/20/2018  PCP: [name], MD Patient coming from: Home  Chief Complaint: Hemoptysis  HPI: Julia Johns is a 68 y.o. female originally from [place], Spanish-speaking, who was previously admitted for work-up of shortness of breath and hemoptysis.  Per discharge summary, extensive work-up during that admission revealed a 4 x 3 cm left lower lobe mass and consolidation as well as paraspinal fluid collection.  Work-up at that time revealed negative AFB with an indeterminate QuantiFERON gold, negative SARS-CoV-2, positive ANCA titers 1-80 with a sed rate of 124 and a CRP of 38.  Patient underwent bronchoscopy on Jun 18, 2018 and repeat bronchoscopy on June 25, 2018 with inconclusive pathology findings but no clear evidence of malignancy.  Plan was to follow-up with Dr. [name].  She was seen by neurosurgery, neurology, and CT surgery during this hospitalization for the paravertebral fluid collection and no further intervention was recommended.  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  She was started on weekly rituximab with first dose on June 17 and plan to get 3 more doses weekly.  Renal ultrasound consistent with medical renal disease.  Underwent renal biopsy on July 06, 2018.  HIV, hepatitis panel negative.  ANA, anti-double-stranded DNA, C3-C4 normal.  ANCA proteinase 3 level elevated.  Also found to have mild worsening of bilateral lung opacities thought to reflect pulmonary edema from acute renal failure.  She was discharged home on 2 L supplemental oxygen.  Also found to have new onset diabetes with A1c 7.9, started on glipizide.   Patient is a poor historian and history limited despite using interpreter services.  States 3 days ago she coughed up a blood clot.  Since then she has not had any further episodes of hemoptysis.  She  is now only coughing up clear mucus.  No chest pain or shortness of breath.  States she does not take any blood thinners.  Denies any abdominal pain, hematemesis, hematochezia, or melena.  Reports having dysuria.   ED Course: Hypotensive with systolic in the 44H and diastolic in the 67R.  Remainder vitals stable.  White count 16.6, previously elevated as well.  Lactic acid normal.  Hemoglobin 9.4, stable since recent hospital discharge.  Creatinine 2.2, improved since recent hospital discharge when it was 3.2.  UA with large amount of hemoglobin, positive nitrite, trace leukocytes, greater than 50 RBCs, 11-20 WBCs, and many bacteria.  Urine culture pending.  Blood culture x2 pending.  COVID-19 rapid test pending. Chest x-ray showing cardiomegaly with low lung volumes.  Findings suspicious for pulmonary edema.  Small bilateral pleural effusions.  Bibasilar airspace opacities which may represent atelectasis or infiltrate.  Review of Systems:  All systems reviewed and apart from history of presenting illness, are negative.  Past Medical History:  Diagnosis Date   Chronic kidney disease    Diabetes mellitus without complication ([place])     Past Surgical History:  Procedure Laterality Date   VIDEO BRONCHOSCOPY Bilateral 06/18/2018   Procedure: VIDEO BRONCHOSCOPY WITH FLUORO;  Surgeon: Rigoberto Noel, MD;  Location: [facility];  Service: Cardiopulmonary;  Laterality: Bilateral;   VIDEO BRONCHOSCOPY WITH ENDOBRONCHIAL ULTRASOUND N/A 06/25/2018   Procedure: VIDEO BRONCHOSCOPY WITH ENDOBRONCHIAL ULTRASOUND;  Surgeon: Rigoberto Noel, MD;  Location: [facility];  Service: Thoracic;  Laterality: N/A;     reports that she has never smoked. She  has never used smokeless tobacco. She reports that she does not drink alcohol or use drugs.  Allergies  Allergen Reactions   Penicillins Anaphylaxis    Patient stated that within a few minutes of receiving a penicillin IM injection she rapidly became unconscious and  required "additional" medication to recover. This occurred when she was ~68 years old and she does not recall what was given to assist in her recovery. She did not develop a rash.     History reviewed. No pertinent family history.  Prior to Admission medications   Medication Sig Start Date End Date Taking? Authorizing Provider  bisacodyl (DULCOLAX) 10 MG suppository Place 1 suppository (10 mg total) rectally daily as needed for moderate constipation. Patient not taking: Reported on 07/10/2018 06/26/18   Georgette Shell, MD  blood glucose meter kit and supplies Dispense based on patient and insurance preference. Use up to four times daily as directed. (FOR ICD-10 E10.9, E11.9). 07/09/18   Georgette Shell, MD  furosemide (LASIX) 80 MG tablet Take 1 tablet (80 mg total) by mouth 2 (two) times daily. 07/09/18   Georgette Shell, MD  glipiZIDE (GLUCOTROL) 5 MG tablet Take 0.5 tablets (2.5 mg total) by mouth 2 (two) times daily before a meal. 07/09/18 07/09/19  Georgette Shell, MD  potassium chloride (K-DUR) 10 MEQ tablet Take 1 tablet (10 mEq total) by mouth daily. 07/09/18   Georgette Shell, MD  predniSONE (DELTASONE) 20 MG tablet Take 3 tablets (60 mg total) by mouth daily with breakfast. 07/10/18   Georgette Shell, MD  senna-docusate (SENOKOT-S) 8.6-50 MG tablet Take 1 tablet by mouth 2 (two) times daily. Patient not taking: Reported on 07/10/2018 06/26/18   Georgette Shell, MD    Physical Exam: Vitals:   07/21/18 0300 07/21/18 0315 07/21/18 0440 07/21/18 0545  BP: 113/63 106/90 105/61   Pulse: 63 63 68   Resp: (!) _0 Temp:   98.5 F (36.9 C)   TempSrc:      SpO2: 94% 94% 100%   Weight:    82 kg  Height:    4' 11" (1.499 m)    Physical Exam  Constitutional: She is oriented to person, place, and time. She appears well-developed and well-nourished. No distress.  HENT:  Head: Normocephalic.  Mouth/Throat: Oropharynx is clear and moist.  Eyes: Right eye  exhibits no discharge. Left eye exhibits no discharge.  Neck: Neck supple.  Cardiovascular: Normal rate, regular rhythm and intact distal pulses.  Pulmonary/Chest: Effort normal. No respiratory distress. She has no wheezes.  Mild crackles appreciated at the bases  Abdominal: Soft. Bowel sounds are normal. She exhibits no distension. There is no abdominal tenderness. There is no guarding.  Musculoskeletal:        General: No edema.  Neurological: She is alert and oriented to person, place, and time.  Skin: Skin is warm and dry. She is not diaphoretic.     Labs on Admission: I have personally reviewed following labs and imaging studies  CBC: Recent Labs  Lab 07/20/18 1907 07/21/18 0623  WBC 16.6* 13.9*  HGB 9.4* 8.1*  HCT 29.2* 24.9*  MCV 86.1 86.2  PLT 251 943   Basic Metabolic Panel: Recent Labs  Lab 07/20/18 1907 07/21/18 0623  NA 136 141  K 3.5 2.7*  CL 96* 106  CO2 28 25  GLUCOSE 188* 81  BUN 107* 88*  CREATININE 2.25* 1.82*  CALCIUM 8.6* 7.6*  MG  --  1.7   GFR: Estimated Creatinine Clearance: 27.4 mL/min (A) (by C-G formula based on SCr of 1.82 mg/dL (H)). Liver Function Tests: No results for input(s): AST, ALT, ALKPHOS, BILITOT, PROT, ALBUMIN in the last 168 hours. No results for input(s): LIPASE, AMYLASE in the last 168 hours. No results for input(s): AMMONIA in the last 168 hours. Coagulation Profile: No results for input(s): INR, PROTIME in the last 168 hours. Cardiac Enzymes: No results for input(s): CKTOTAL, CKMB, CKMBINDEX, TROPONINI in the last 168 hours. BNP (last 3 results) No results for input(s): PROBNP in the last 8760 hours. HbA1C: No results for input(s): HGBA1C in the last 72 hours. CBG: Recent Labs  Lab 07/20/18 2134 07/21/18 0718  GLUCAP 169* 72   Lipid Profile: No results for input(s): CHOL, HDL, LDLCALC, TRIG, CHOLHDL, LDLDIRECT in the last 72 hours. Thyroid Function Tests: No results for input(s): TSH, T4TOTAL, FREET4, T3FREE,  THYROIDAB in the last 72 hours. Anemia Panel: No results for input(s): VITAMINB12, FOLATE, FERRITIN, TIBC, IRON, RETICCTPCT in the last 72 hours. Urine analysis:    Component Value Date/Time   COLORURINE YELLOW 07/20/2018 2344   APPEARANCEUR HAZY (A) 07/20/2018 2344   LABSPEC 1.010 07/20/2018 2344   PHURINE 5.0 07/20/2018 2344   GLUCOSEU 50 (A) 07/20/2018 2344   HGBUR LARGE (A) 07/20/2018 2344   [place] 07/20/2018 [place] 07/20/2018 2344   PROTEINUR 100 (A) 07/20/2018 2344   NITRITE POSITIVE (A) 07/20/2018 2344   LEUKOCYTESUR TRACE (A) 07/20/2018 2344    Radiological Exams on Admission: Dg Chest 2 View  Result Date: 07/21/2018 CLINICAL DATA:  Hemoptysis EXAM: CHEST - 2 VIEW COMPARISON:  July 05, 2018 FINDINGS: The cardiac silhouette is enlarged. Again noted are bilateral pleural effusions. The lung volumes are low. Hazy bilateral airspace opacities are again noted. There is no pneumothorax. There is no definite acute osseous abnormality. IMPRESSION: 1. Cardiomegaly with low lung volumes. 2. Again noted are findings suspicious for pulmonary edema. 3. Small bilateral pleural effusions. 4. Bibasilar airspace opacities which may represent atelectasis or infiltrate. Electronically Signed   By: Constance Holster M.D.   On: 07/21/2018 00:31    EKG: Independently reviewed.  T wave inversions in lead III, aVF, V3-V5 appear new compared to prior EKG from July 06, 2018.  Assessment/Plan Principal Problem:   Hemoptysis Active Problems:   Lung mass   Acute lower UTI   Hypotension   CKD (chronic kidney disease)  Hemoptysis, suspect secondary to LLL lung mass Patient reported one episode of hematemesis 3 days ago.  Hypotensive on arrival but remainder of vitals stable.  No further episodes of hemoptysis in the ED.  Hemoglobin 9.4, stable since recent hospital discharge.  Suspect hypotension is related to volume depletion from home high-dose Lasix use (80 twice  daily).  CT done during prior hospitalization showing left lower lobe lung mass which is the likely source. Work-up done previously showing negative AFB with an indeterminate QuantiFERON gold. Patient underwent bronchoscopy twice recently with inconclusive pathology findings but no clear evidence of malignancy. -Type and screen -Continue to monitor CBC -Avoid anticoagulation/antiplatelet agents -Consult pulmonology in a.m.  UTI Patient endorses dysuria. UA with large amount of hemoglobin, positive nitrite, trace leukocytes, greater than 50 RBCs, 11-20 WBCs, and many bacteria.  Presented with hypotension which is now improved with IV fluid.  White count 16.6, previously elevated as well.  Sepsis less likely given no fever or lactic acidosis. -Levaquin given penicillin allergy -Urine culture pending  Hypotension Suspect  related to home high-dose Lasix use.  Blood pressure now improved after 1.5 L IV fluid boluses.  Also has a UTI, do not think hypotension is related to sepsis given no fever or lactic acidosis.  Chest x-ray with bibasilar airspace opacities, likely atelectasis versus ? infiltrate.  COVID-19 rapid test negative. 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  Creatinine 2.2, improved since recent hospital discharge when it was 3.2. -Continue to monitor renal function -Continue prednisone -Will need continuation of weekly rituximab.  Started on June 17 with plan to continue 3 more weekly doses.  Cardiomegaly and ? pulmonary edema on chest x-ray Clinically does not appear significantly volume overloaded on exam.  Not hypoxic. Echo done Jun 16, 2018 showing normal LVEF of 60 to 65% and normal LV diastolic parameters.  Chest x-ray personally reviewed and bibasilar opacities seen on previous chest x-rays done June 12 and June 14 as well. -Hold Lasix given hypotension on arrival -Check  BNP  T wave inversions on EKG T wave inversions in lead III, aVF, V3-V5 appear new compared to prior EKG from July 06, 2018. ? Demand ischemia although does not appear significantly volume overloaded on exam and hemoglobin checked on admission not significantly changed from baseline.  High-sensitivity troponin negative.  Patient is not complaining of chest pain. -Cardiac monitoring -Repeat EKG in a.m.  Uncontrolled newly diagnosed type 2 diabetes Diagnosis with diabetes during her recent hospitalization.  Hemoglobin A1c 7.9 on July 03, 2018.  Started on glipizide. -Hold home glipizide -Sliding scale insulin sensitive and CBG checks  DVT prophylaxis: SCDs Code Status: Patient wishes to be full code. Family Communication: No family available. Disposition Plan: Anticipate discharge after clinical improvement. Consults called: None Admission status: It is my clinical opinion that referral for OBSERVATION is reasonable and necessary in this patient based on the above information provided. The aforementioned taken together are felt to place the patient at high risk for further clinical deterioration. However it is anticipated that the patient may be medically stable for discharge from the hospital within 24 to 48 hours.  The medical decision making on this patient was of high complexity and the patient is at high risk for clinical deterioration, therefore this is a level 3 visit.  Shela Leff MD [facility] Pager [phone]  If 7PM-7AM, please contact night-coverage [email or web address] Password [email or web address]  07/21/2018, 9:45 AM

## 2018-07-22 ENCOUNTER — Inpatient Hospital Stay (HOSPITAL_COMMUNITY)
Admission: RE | Admit: 2018-07-22 | Discharge: 2018-07-22 | Disposition: A | Discharge status: Home / Self Care | Payer: Medicare HMO | Source: Ambulatory Visit | Attending: Nephrology | Admitting: Nephrology

## 2018-07-22 DIAGNOSIS — R042 Hemoptysis: Secondary | ICD-10-CM | POA: Diagnosis not present

## 2018-07-22 LAB — CBC WITH DIFFERENTIAL/PLATELET
Abs Immature Granulocytes: 0.07 10*3/uL (ref 0.00–0.07)
Basophils Absolute: 0 10*3/uL (ref 0.0–0.1)
Basophils Relative: 0 %
Eosinophils Absolute: 0 10*3/uL (ref 0.0–0.5)
Eosinophils Relative: 0 %
HCT: 27 % — ABNORMAL LOW (ref 36.0–46.0)
Hemoglobin: 8.5 g/dL — ABNORMAL LOW (ref 12.0–15.0)
Immature Granulocytes: 1 %
Lymphocytes Relative: 5 %
Lymphs Abs: 0.5 10*3/uL — ABNORMAL LOW (ref 0.7–4.0)
MCH: 27.4 pg (ref 26.0–34.0)
MCHC: 31.5 g/dL (ref 30.0–36.0)
MCV: 87.1 fL (ref 80.0–100.0)
Monocytes Absolute: 0.3 10*3/uL (ref 0.1–1.0)
Monocytes Relative: 2 %
Neutro Abs: 10.8 10*3/uL — ABNORMAL HIGH (ref 1.7–7.7)
Neutrophils Relative %: 92 %
Platelets: 197 10*3/uL (ref 150–400)
RBC: 3.1 MIL/uL — ABNORMAL LOW (ref 3.87–5.11)
RDW: 16.7 % — ABNORMAL HIGH (ref 11.5–15.5)
WBC: 11.7 10*3/uL — ABNORMAL HIGH (ref 4.0–10.5)
nRBC: 0 % (ref 0.0–0.2)

## 2018-07-22 LAB — BASIC METABOLIC PANEL
Anion gap: 11 (ref 5–15)
BUN: 70 mg/dL — ABNORMAL HIGH (ref 8–23)
CO2: 23 mmol/L (ref 22–32)
Calcium: 8.2 mg/dL — ABNORMAL LOW (ref 8.9–10.3)
Chloride: 110 mmol/L (ref 98–111)
Creatinine, Ser: 1.83 mg/dL — ABNORMAL HIGH (ref 0.44–1.00)
GFR calc Af Amer: 32 mL/min — ABNORMAL LOW (ref >60)
GFR calc non Af Amer: 28 mL/min — ABNORMAL LOW (ref >60)
Glucose, Bld: 101 mg/dL — ABNORMAL HIGH (ref 70–99)
Potassium: 3.8 mmol/L (ref 3.5–5.1)
Sodium: 144 mmol/L (ref 135–145)

## 2018-07-22 LAB — URINE CULTURE: Culture: >=100000 — AB

## 2018-07-22 LAB — GLUCOSE, CAPILLARY
Glucose-Capillary: 101 mg/dL — ABNORMAL HIGH (ref 70–99)
Glucose-Capillary: 148 mg/dL — ABNORMAL HIGH (ref 70–99)

## 2018-07-22 MED ORDER — FUROSEMIDE 80 MG PO TABS: 40.0000 mg | ORAL_TABLET | Freq: Two times a day (BID) | ORAL | 2 refills | Status: DC

## 2018-07-22 MED ORDER — ONDANSETRON HCL 4 MG/2ML IJ SOLN: 4.0000 mg | Freq: Four times a day (QID) | INTRAMUSCULAR | Status: DC | PRN

## 2018-07-22 MED ORDER — FOSFOMYCIN TROMETHAMINE 3 G PO PACK
3.0000 g | PACK | Freq: Once | ORAL | Status: AC
  Administered 2018-07-22: 3 g via ORAL
  Filled 2018-07-22: qty 3

## 2018-07-22 MED ORDER — SENNOSIDES-DOCUSATE SODIUM 8.6-50 MG PO TABS
1.0000 | ORAL_TABLET | Freq: Once | ORAL | Status: DC
  Filled 2018-07-22: qty 1

## 2018-07-22 MED ORDER — ACETAMINOPHEN 325 MG PO TABS: 650.0000 mg | ORAL_TABLET | Freq: Four times a day (QID) | ORAL | Status: DC | PRN

## 2018-07-22 NOTE — Progress Notes (Signed)
DISCHARGE NOTE HOME Julia Johns to be discharged Home per MD order. Discussed prescriptions and follow up appointments with the patient. Prescriptions given to patient; medication list explained in detail. Patient verbalized understanding.  Skin clean, dry and intact without evidence of skin break down, no evidence of skin tears noted. IV catheter discontinued intact. Site without signs and symptoms of complications. Dressing and pressure applied. Pt denies pain at the site currently. No complaints noted.  Patient free of lines, drains, and wounds.   An After Visit Summary (AVS) was printed and given to the patient. Patient escorted via wheelchair, and discharged home via private auto.  Paulla Fore, RN

## 2018-07-22 NOTE — Plan of Care (Signed)
  Problem: Education: Goal: Knowledge of General Education information will improve Description: Including pain rating scale, medication(s)/side effects and non-pharmacologic comfort measures Outcome: Progressing   Problem: Pain Managment: Goal: General experience of comfort will improve Outcome: Progressing   

## 2018-07-22 NOTE — Discharge Summary (Signed)
Physician Discharge Summary  Julia Johns LTJ:[id number] DOB: March 19, 1950 DOA: 07/20/2018  PCP: [name], MD  Admit date: 07/20/2018 Discharge date: 07/22/2018 Time spent: 35 minutes  Recommendations for Outpatient Follow-up:  1. Fosfomycin x 1 for ? Cystitis  2. Going to infusion center on d/c for [name] as scheduled 3. Needs labs ~ 1 week--check renal panel  4. 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  Improved ED visit with bolus IV fluids No hemoptysis--hasn;t had further hemoptysis  Was on IV saline 100 cc/h With 40 of K as she was quite severely hyponatremic at 2.7 which improved on d/c  Hospital Course:  aki hypok  2/2 lasix high dose-sending home lasix 40 bid--d/w d-I-l.  Get renal panel 1 week  No K rx on d/c ANCA vasculitis [name] to be continued--Appreciate Dr. Noel Journey coordinating--Daughter in law to call for details and confirm and [name] staff to liase with her Has OP appt Dr. 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 Noel Journey  Discharge Exam: Vitals:   07/22/18 0504 07/22/18 0926  BP: (!)  112/47 111/63  Pulse: (!) 58 66  Resp: (!) 21 18  Temp: 98.3 F (36.8 C) 98.1 F (36.7 C)  SpO2: 94% 95%    General: eomi ncat Cardiovascular: s1 s2 no  Respiratory: cta b no added   Discharge Instructions   Discharge Instructions    Diet - low sodium heart healthy   Complete by: As directed    Discharge instructions   Complete by: As directed    Finish small course of keflex--you might have had a small urinary infection continue prednisone until you follow with Dr. Jimmy Footman Go to the infusion center today to get your infusion--Please report to Dr. Jimmy Footman in 1-2 weeks You will need labs as well in the near future   Increase activity slowly   Complete by: As directed      Allergies as of 07/22/2018      Reactions   Penicillins Anaphylaxis   Patient stated that within a few minutes of receiving a penicillin IM injection she rapidly became unconscious and required "additional" medication to recover. This occurred when she was ~68 years old and she does not recall what was given to assist in her recovery. She did not develop a rash.       Medication List    STOP taking these medications   bisacodyl 10 MG suppository Commonly known as: DULCOLAX   blood glucose meter kit and supplies     TAKE these medications   acetaminophen 325 MG tablet Commonly known as: TYLENOL Take 2 tablets (650 mg total) by mouth every 6 (six) hours as needed for mild pain (or Fever >/=  101).   furosemide 80 MG tablet Commonly known as: LASIX Take 0.5 tablets (40 mg total) by mouth 2 (two) times daily. What changed: how much to take   glipiZIDE 5 MG tablet Commonly known as: Glucotrol Take 0.5 tablets (2.5 mg total) by mouth 2 (two) times daily before a meal.   potassium chloride 10 MEQ tablet Commonly known as: K-DUR Take 1 tablet (10 mEq total) by mouth daily.   predniSONE 20 MG tablet Commonly known as: DELTASONE Take 3 tablets (60 mg total) by mouth daily with breakfast.    senna-docusate 8.6-50 MG tablet Commonly known as: Senokot-S Take 1 tablet by mouth 2 (two) times daily.      Allergies  Allergen Reactions  . Penicillins Anaphylaxis    Patient stated that within a few minutes of receiving a penicillin IM injection she rapidly became unconscious and required "additional" medication to recover. This occurred when she was ~68 years old and she does not recall what was given to assist in her recovery. She did not develop a rash.       The results of significant diagnostics from this hospitalization (including imaging, microbiology, ancillary and laboratory) are listed below for reference.    Significant Diagnostic Studies: Dg Chest 2 View  Result Date: 07/21/2018 CLINICAL DATA:  Hemoptysis EXAM: CHEST - 2 VIEW COMPARISON:  July 05, 2018 FINDINGS: The cardiac silhouette is enlarged. Again noted are bilateral pleural effusions. The lung volumes are low. Hazy bilateral airspace opacities are again noted. There is no pneumothorax. There is no definite acute osseous abnormality. IMPRESSION: 1. Cardiomegaly with low lung volumes. 2. Again noted are findings suspicious for pulmonary edema. 3. Small bilateral pleural effusions. 4. Bibasilar airspace opacities which may represent atelectasis or infiltrate. Electronically Signed   By: Constance Holster M.D.   On: 07/21/2018 00:31   Dg Chest 2 View  Result Date: 07/05/2018 CLINICAL DATA:  Follow-up. EXAM: CHEST - 2 VIEW COMPARISON:  07/03/2018 FINDINGS: Lung volumes remain low. The cardiomediastinal silhouette is unchanged. There is peribronchial cuffing with increased patchy opacity in the perihilar regions bilaterally. Left greater than right basilar airspace opacities have not significantly changed with masslike left basilar density on prior CT. Small pleural effusions are suspected. No pneumothorax is identified. IMPRESSION: Low lung volumes with mild worsening of bilateral lung opacities which may reflect edema or  pneumonia. Electronically Signed   By: Logan Bores M.D.   On: 07/05/2018 08:07   US Renal  Result Date: 07/03/2018 CLINICAL DATA:  Acute renal failure. EXAM: RENAL / URINARY TRACT ULTRASOUND COMPLETE COMPARISON:  None. FINDINGS: Right Kidney: Renal measurements: 13.1 x 5.5 x 4.6 cm = volume: 172 mL . Echogenicity within normal limits. No mass or hydronephrosis visualized. Left Kidney: Renal measurements: 12.9 x 7.8 x 6.0 cm = volume: 316 mL. Borderline increased parenchymal echogenicity. No mass or stone. No hydronephrosis Bladder: Appears normal for degree of bladder distention. IMPRESSION: 1. Borderline increased renal parenchymal echogenicity on the left suggesting medical renal disease. The left kidney is larger than the right. No renal masses, stones or hydronephrosis. Electronically Signed   By: Lajean Manes M.D.   On: 07/03/2018 17:52   Dg Chest Portable 1 View  Result Date: 07/03/2018 CLINICAL DATA:  Pts family states pt was discharged 1 wk ago (admitted for stomach mass and PNA). States the pt has been having dysuria x 2-3 days, also having bilateral leg swelling. Pt was told by doctor that labs yesterday reflected new onset DM and acute  renal failure - sent by doc to ED for eval EXAM: PORTABLE CHEST 1 VIEW COMPARISON:  Chest CT, 06/16/2018. Prior chest radiographs, most recent dated 06/05/2018. FINDINGS: Lung volumes are low. There is opacity at both lung bases consistent with a combination of small pleural effusions and atelectasis. Pneumonia at either lung base is possible. There are prominent bronchovascular markings bilaterally, but no overt pulmonary edema. Cardiac silhouette is normal in size. No mediastinal or hilar masses. Skeletal structures are grossly intact. IMPRESSION: 1. Small effusions with bilateral lung base opacities, which are most likely due to atelectasis. Consider pneumonia if there are consistent clinical findings. Prominent bronchovascular markings accentuated by the low  lung volumes, without overt pulmonary edema. Electronically Signed   By: Lajean Manes M.D.   On: 07/03/2018 15:00   Dg Swallowing Func-speech Pathology  Result Date: 06/24/2018 Objective Swallowing Evaluation: Type of Study: Bedside Swallow Evaluation  Patient Details Name: Marquesha Robideau MRN: [id number] Date of Birth: Sep 27, 1950 Today's Date: 06/24/2018 Time: SLP Start Time (ACUTE ONLY): 1324 -SLP Stop Time (ACUTE ONLY): 1410 SLP Time Calculation (min) (ACUTE ONLY): 25 min Past Medical History: No past medical history on file. Past Surgical History: Past Surgical History: Procedure Laterality Date . VIDEO BRONCHOSCOPY Bilateral 06/18/2018  Procedure: VIDEO BRONCHOSCOPY WITH FLUORO;  Surgeon: Rigoberto Noel, MD;  Location: [facility];  Service: Cardiopulmonary;  Laterality: Bilateral; HPI: This is a 68 year old female who presented as a transfer from [facility] with complaints of shortness of breath and blood-tinged sputum.  Patient underwent CT imaging which revealed a paraspinal soft tissue thickening from T6-T10 as well as a left lower lobe density within the CT which was enlarged from prior imaging concerning for an inflammatory/infectious etiology. Bronchoscopy 5/28. No culture growth. Cytology with atypical cells. No malignancy identified. TB- AFBs neg x 2, Quantiferon TB indeterminate, Covid negative. MD ordered MBS for objective assessment of swallow function.  No data recorded Assessment / Plan / Recommendation CHL IP CLINICAL IMPRESSIONS 06/24/2018 Clinical Impression Pt demonstrates normal swallow function. She could not swallow a barium tablet, could not orally transit despite liquid and puree boluses given, said it was too large. No SLP f/u needed. Continue current diet.  SLP Visit Diagnosis Dysphagia, unspecified (R13.10) Attention and concentration deficit following -- Frontal lobe and executive function deficit following -- Impact on safety and function Mild aspiration risk   CHL IP  TREATMENT RECOMMENDATION 06/24/2018 Treatment Recommendations No treatment recommended at this time   No flowsheet data found. CHL IP DIET RECOMMENDATION 06/24/2018 SLP Diet Recommendations Regular solids;Thin liquid Liquid Administration via Cup;Straw Medication Administration Whole meds with liquid Compensations -- Postural Changes --   No flowsheet data found.  No flowsheet data found.  No flowsheet data found.     CHL IP ORAL PHASE 06/24/2018 Oral Phase WFL Oral - Pudding Teaspoon -- Oral - Pudding Cup -- Oral - Honey Teaspoon -- Oral - Honey Cup -- Oral - Nectar Teaspoon -- Oral - Nectar Cup -- Oral - Nectar Straw -- Oral - Thin Teaspoon -- Oral - Thin Cup -- Oral - Thin Straw -- Oral - Puree -- Oral - Mech Soft -- Oral - Regular -- Oral - Multi-Consistency -- Oral - Pill -- Oral Phase - Comment --  CHL IP PHARYNGEAL PHASE 06/24/2018 Pharyngeal Phase WFL Pharyngeal- Pudding Teaspoon -- Pharyngeal -- Pharyngeal- Pudding Cup -- Pharyngeal -- Pharyngeal- Honey Teaspoon -- Pharyngeal -- Pharyngeal- Honey Cup -- Pharyngeal -- Pharyngeal- Nectar Teaspoon -- Pharyngeal -- Pharyngeal- Nectar Cup --  Pharyngeal -- Pharyngeal- Nectar Straw -- Pharyngeal -- Pharyngeal- Thin Teaspoon -- Pharyngeal -- Pharyngeal- Thin Cup -- Pharyngeal -- Pharyngeal- Thin Straw -- Pharyngeal -- Pharyngeal- Puree -- Pharyngeal -- Pharyngeal- Mechanical Soft -- Pharyngeal -- Pharyngeal- Regular -- Pharyngeal -- Pharyngeal- Multi-consistency -- Pharyngeal -- Pharyngeal- Pill -- Pharyngeal -- Pharyngeal Comment --  No flowsheet data found. Herbie Baltimore, MA CCC-SLP Acute Rehabilitation Services Pager [phone] Office [phone] Lynann Beaver 06/24/2018, 2:48 PM              US Biopsy (kidney)  Result Date: 07/06/2018 INDICATION: 89 year old with acute kidney injury.  Request for renal biopsy. EXAM: ULTRASOUND-GUIDED RIGHT RENAL BIOPSY MEDICATIONS: None. ANESTHESIA/SEDATION: Moderate (conscious) sedation was employed during this  procedure. A total of Versed 1.0 mg and Fentanyl 25 mcg was administered intravenously. Moderate Sedation Time: 16 minutes. The patient's level of consciousness and vital signs were monitored continuously by radiology nursing throughout the procedure under my direct supervision. FLUOROSCOPY TIME:  None COMPLICATIONS: None immediate. PROCEDURE: Informed written consent was obtained from the patient with a Spanish translator after a thorough discussion of the procedural risks, benefits and alternatives. All questions were addressed. Maximal Sterile Barrier Technique was utilized including caps, mask, sterile gowns, sterile gloves, sterile drape, hand hygiene and skin antiseptic. A timeout was performed prior to the initiation of the procedure. Patient was placed prone and both kidneys were evaluated with ultrasound. The right kidney lower pole was targeted for biopsy. Right flank was prepped with chlorhexidine and sterile field was created. Skin and soft tissues were anesthetized with 1% lidocaine. Using ultrasound guidance, 16 gauge core biopsy needle was directed into the right kidney lower pole and a biopsy was obtained. Specimen placed in saline. A second core biopsy was obtained with ultrasound guidance. Two adequate core specimens were obtained. Follow-up ultrasound imaging was performed. Bandage placed over the puncture site. FINDINGS: Bilateral echogenic kidneys. Negative for hydronephrosis. Two adequate core biopsies obtained from the right kidney lower pole. No significant bleeding or hematoma formation following the core biopsies. IMPRESSION: Ultrasound-guided core biopsy of the right kidney lower pole. Electronically Signed   By: Markus Daft M.D.   On: 07/06/2018 15:24    Microbiology: Recent Results (from the past 240 hour(s))  Urine culture     Status: Abnormal   Collection Time: 07/20/18 11:44 PM   Specimen: Urine, Random  Result Value Ref Range Status   Specimen Description URINE, RANDOM  Final    Special Requests   Final    NONE Performed at [facility], [address], [place], [place] [address]    Culture >=100,000 COLONIES/mL ESCHERICHIA COLI (A)  Final   Report Status 07/22/2018 FINAL  Final   Organism ID, Bacteria ESCHERICHIA COLI (A)  Final      Susceptibility   Escherichia coli - MIC*    AMPICILLIN >=32 RESISTANT Resistant     CEFAZOLIN <=4 SENSITIVE Sensitive     CEFTRIAXONE <=1 SENSITIVE Sensitive     CIPROFLOXACIN >=4 RESISTANT Resistant     GENTAMICIN <=1 SENSITIVE Sensitive     IMIPENEM <=0.25 SENSITIVE Sensitive     NITROFURANTOIN <=16 SENSITIVE Sensitive     TRIMETH/SULFA >=320 RESISTANT Resistant     AMPICILLIN/SULBACTAM >=32 RESISTANT Resistant     PIP/TAZO <=4 SENSITIVE Sensitive     Extended ESBL NEGATIVE Sensitive     * >=100,000 COLONIES/mL ESCHERICHIA COLI  Blood culture (routine x 2)     Status: None (Preliminary result)   Collection Time: 07/21/18  12:37 AM   Specimen: BLOOD RIGHT HAND  Result Value Ref Range Status   Specimen Description BLOOD RIGHT HAND  Final   Special Requests   Final    BOTTLES DRAWN AEROBIC ONLY Blood Culture results may not be optimal due to an inadequate volume of blood received in culture bottles   Culture   Final    NO GROWTH 1 DAY Performed at [facility], [place] [address] [place], [place] [address]    Report Status PENDING  Incomplete  Blood culture (routine x 2)     Status: None (Preliminary result)   Collection Time: 07/21/18 12:38 AM   Specimen: BLOOD RIGHT WRIST  Result Value Ref Range Status   Specimen Description BLOOD RIGHT WRIST  Final   Special Requests   Final    BOTTLES DRAWN AEROBIC ONLY Blood Culture results may not be optimal due to an inadequate volume of blood received in culture bottles   Culture   Final    NO GROWTH 1 DAY Performed at [facility], [place] [address] [place], [place] [address]    Report Status PENDING  Incomplete  SARS Coronavirus 2 (CEPHEID - Performed in [place] hospital lab), Hosp Order     Status: None   Collection Time: 07/21/18  2:44 AM   Specimen: Nasopharyngeal Swab  Result Value Ref Range Status   SARS Coronavirus 2 NEGATIVE NEGATIVE Final    Comment: (NOTE) If result is NEGATIVE SARS-CoV-2 target nucleic acids are NOT DETECTED. The SARS-CoV-2 RNA is generally detectable in upper and lower  respiratory specimens during the acute phase of infection. The lowest  concentration of SARS-CoV-2 viral copies this assay can detect is 250  copies / mL. A negative result does not preclude SARS-CoV-2 infection  and should not be used as the sole basis for treatment or other  patient management decisions.  A negative result may occur with  improper specimen collection / handling, submission of specimen other  than nasopharyngeal swab, presence of viral mutation(s) within the  areas targeted by this assay, and inadequate number of viral copies  (<250 copies / mL). A negative result must be combined with clinical  observations, patient history, and epidemiological information. If result is POSITIVE SARS-CoV-2 target nucleic acids are DETECTED. The SARS-CoV-2 RNA is generally detectable in upper and lower  respiratory specimens dur ing the acute phase of infection.  Positive  results are indicative of active infection with SARS-CoV-2.  Clinical  correlation with patient history and other diagnostic information is  necessary to determine patient infection status.  Positive results do  not rule out bacterial infection or co-infection with other viruses. If result is PRESUMPTIVE POSTIVE SARS-CoV-2 nucleic acids MAY BE PRESENT.   A presumptive positive result was obtained on the submitted specimen  and confirmed on repeat testing.  While 2019 novel coronavirus  (SARS-CoV-2) nucleic acids may be present in the submitted sample  additional confirmatory testing may be necessary for epidemiological  and / or clinical management purposes  to  differentiate between  SARS-CoV-2 and other Sarbecovirus currently known to infect humans.  If clinically indicated additional testing with an alternate test  methodology [phone]) is advised. The SARS-CoV-2 RNA is generally  detectable in upper and lower respiratory sp ecimens during the acute  phase of infection. The expected result is Negative. Fact Sheet for Patients:  StrictlyIdeas.no Fact Sheet for Healthcare Providers: BankingDealers.co.za This test is not yet approved or cleared by the [place] FDA  and has been authorized for detection and/or diagnosis of SARS-CoV-2 by FDA under an Emergency Use Authorization (EUA).  This EUA will remain in effect (meaning this test can be used) for the duration of the COVID-19 declaration under Section 564(b)(1) of the Act, 21 U.S.C. section 360bbb-3(b)(1), unless the authorization is terminated or revoked sooner. Performed at [facility], [place] [address], [place], [place] [address]      Labs: Basic Metabolic Panel: Recent Labs  Lab 07/20/18 1907 07/21/18 0623 07/22/18 0746  NA 136 141 144  K 3.5 2.7* 3.8  CL 96* 106 110  CO2 _0 GLUCOSE 188* 81 101*  BUN 107* 88* 70*  CREATININE 2.25* 1.82* 1.83*  CALCIUM 8.6* 7.6* 8.2*  MG  --  1.7  --    Liver Function Tests: No results for input(s): AST, ALT, ALKPHOS, BILITOT, PROT, ALBUMIN in the last 168 hours. No results for input(s): LIPASE, AMYLASE in the last 168 hours. No results for input(s): AMMONIA in the last 168 hours. CBC: Recent Labs  Lab 07/20/18 1907 07/21/18 0623 07/22/18 0746  WBC 16.6* 13.9* 11.7*  NEUTROABS  --   --  10.8*  HGB 9.4* 8.1* 8.5*  HCT 29.2* 24.9* 27.0*  MCV 86.1 86.2 87.1  PLT 251 209 197   Cardiac Enzymes: No results for input(s): CKTOTAL, CKMB, CKMBINDEX, TROPONINI in the last 168 hours. BNP: BNP (last 3 results) Recent Labs    07/21/18 1101  BNP 344.1*    ProBNP (last 3  results) No results for input(s): PROBNP in the last 8760 hours.  CBG: Recent Labs  Lab 07/21/18 0718 07/21/18 1105 07/21/18 1556 07/21/18 2118 07/22/18 0712  GLUCAP 72 165* 77 146* 101*       Signed:  Nita Sells MD   [facility] 07/22/2018, 10:24 AM

## 2018-07-23 ENCOUNTER — Encounter: Payer: Self-pay | Admitting: *Deleted

## 2018-07-23 ENCOUNTER — Other Ambulatory Visit: Payer: Self-pay | Admitting: *Deleted

## 2018-07-23 LAB — ACID FAST CULTURE WITH REFLEXED SENSITIVITIES (MYCOBACTERIA)
Acid Fast Culture: NEGATIVE
Source of Sample: POSITIVE

## 2018-07-23 NOTE — Patient Outreach (Signed)
[facility]) [place] Telephone Outreach,  PCP office completes Transition of Care follow up post-hospital discharge Post-hospital discharge day # 1 Unsuccessful consecutive outreach attempt # 1- previously engaged patient/ caregiver  07/23/2018  [name] 17-Mar-1950 [id number]  Unsuccessful telephone outreach to Julia Johns, caregiver/ son of patient Julia Johns, 68 y/o female currently active with [facility] after recent hospitalization June 12-18, 2020 for acute renal failure; patient was discharged home to self-care with home health services in place.  Unfortunately, patient experienced hospital re-admission 11 days after her previous admission, on June 29- July 22, 2018 for hemoptysis, acute cystitis with hematuria, and hypotension.  Patient has history including, but not limited to, CKD; DM; recently diagnosed lung mass.  Call placed today in coverage of patient's primary [facility] RN CM after hospital discharge yesterday; noted from review of EMR that patient requested [place] calls be placed to her caregiver/ son, [name].  At time of call today, caregiver's phone number rang without physical or voice mail pick up; unable to leave HIPAA compliant voice message requesting call back.  Plan:  Will make patient's primary [facility] RN CM aware of unsuccessful call attempt and ensure that call be re-attempted within 4 business days if I do not hear back from patient/ caregiver later today  Oneta Rack, RN, BSN, [place] Coordinator [facility] Care Management  [phone]

## 2018-07-26 DIAGNOSIS — R918 Other nonspecific abnormal finding of lung field: Secondary | ICD-10-CM | POA: Diagnosis not present

## 2018-07-26 LAB — ACID FAST CULTURE WITH REFLEXED SENSITIVITIES (MYCOBACTERIA): Acid Fast Culture: NEGATIVE

## 2018-07-26 LAB — CULTURE, BLOOD (ROUTINE X 2)
Culture: NO GROWTH
Culture: NO GROWTH

## 2018-07-27 DIAGNOSIS — N179 Acute kidney failure, unspecified: Secondary | ICD-10-CM | POA: Diagnosis not present

## 2018-07-27 DIAGNOSIS — R918 Other nonspecific abnormal finding of lung field: Secondary | ICD-10-CM | POA: Diagnosis not present

## 2018-07-27 DIAGNOSIS — N189 Chronic kidney disease, unspecified: Secondary | ICD-10-CM | POA: Diagnosis not present

## 2018-07-28 ENCOUNTER — Encounter: Payer: Self-pay | Admitting: *Deleted

## 2018-07-28 ENCOUNTER — Other Ambulatory Visit: Payer: Self-pay | Admitting: *Deleted

## 2018-07-28 NOTE — Patient Outreach (Addendum)
[facility]) Care Management  [place]  07/28/2018   Julia Johns 02-20-1950 [id number] Telephone  assessment   Referral received 6/17 Referral source: [facility] hospital liaison Referral reason: Complex care management , new onset of Diabetes , A1c 7.9 and assess for further needs after discharge.  Hospital admission 6/12- 6/18, Dx : Acute renal failure, Vasculitis , bilateral lower leg edema,   Patient is a 68 year old female originally from [place], [place] speaking,who was recently discharged from [facility] 5/19-6/5  after work-up for shortness of breath and hemoptysis.Extensive work-up on last admission revealed 4 x 3 cm LLL mass-likeconsolidation as well as paraspinal fluid collection.  Patient underwent a bronchoscopy on 06/18/2018 and repeat bronchoscopy on 06/25/2018 with inconclusive pathology findings but no clear evidence of malignancy.  Less than 30 day hospital readmission 6/29- 7/1, Dx: Hemoptysis, Acute lower UTI , CKD   Subjective:   Outreach call to patient contact number using [place] interpreter line , representative Allena Katz [phone]. Provided information for call greeting, Interpreter reports no answer at phone number, stating number rang several times then just hung up, unable to leave a voice message.   Placed call to patient son [name], that patient has given verbal okay to speak with by telephone on her behalf.  [name] discussed patient recent readmission to hospital he denies any further complaint of coughing up blood . He reports patient having a UTI .  He discussed patient patient is getting stronger, she is using her walker.He did report patient requesting to sleep in the living room one night on the sofa instead of her room and she experienced a fall when getting up from the sofa ,stating that her leg gave way, reports that  she did not get hurt or hit her head . Patient has family member with her at all times. [name]  verifies [facility] home health RN has contacted him and plans return call.   [name] discussed noting patient having some problems with memory he reports 'that she is making up stuff or conversations that didn't happen. Encouraged to discuss with PCP at visit on this week and sooner worsening.  Discussed symptoms of UTI, reports patient urinating well, no noted odor to urine, no fever. he reports that patient is drinking well .  Patient discussed receiving call after discharge from hospital to stop giving fluid pill . He noted patient with increase in swelling in legs on yesterday and weight up to 180, when prior weighs had been 175-176, he spoke with representative at Kidney MD office that instructed patient to take Lasix 80 once daily,he stated legs looked better on today, but patient had not weighed before he left for work.   Diabetes Patient son discussed patient blood sugars reading better, 90 to 120, not higher than 200. He reports patient had been started on insulin prior to hospital admission as well has her glipizide increased , insulin  was not continued after discharge. Reviewed symptoms of low blood sugar and treatment he denies any low readings.   Medications  Patient was recently discharged from hospital and all medications have been reviewed.   Appointments  7/8 [name] infusion then office visit with kidney MD Dr. Tobie Poet follow up visit on 7/10 patient son recalls.     Encounter Medications:  Outpatient Encounter Medications as of 07/28/2018  Medication Sig Note  . acetaminophen (TYLENOL) 325 MG tablet Take 2 tablets (650 mg total) by mouth every 6 (six) hours as needed for mild pain (or Fever >/=  101).   . furosemide (LASIX) 80 MG tablet Take 0.5 tablets (40 mg total) by mouth 2 (two) times daily.   Marland Kitchen glipiZIDE (GLUCOTROL) 5 MG tablet Take 0.5 tablets (2.5 mg total) by mouth 2 (two) times daily before a meal.   . potassium chloride (K-DUR) 10 MEQ tablet Take 1 tablet (10 mEq  total) by mouth daily.   . predniSONE (DELTASONE) 20 MG tablet Take 3 tablets (60 mg total) by mouth daily with breakfast.   . senna-docusate (SENOKOT-S) 8.6-50 MG tablet Take 1 tablet by mouth 2 (two) times daily. (Patient not taking: Reported on 07/10/2018) 07/03/2018: Have not started yet   No facility-administered encounter medications on file as of 07/28/2018.     Functional Status:  In your present state of health, do you have any difficulty performing the following activities: 07/21/2018 07/10/2018  Hearing? N N  Vision? N N  Difficulty concentrating or making decisions? N N  Walking or climbing stairs? Y Y  Comment - uses walker  Dressing or bathing? Y Y  Comment - needs help from family  Doing errands, shopping? Y Y  Comment - family helps  Preparing Food and eating ? - Y  Comment - familly helps  In the past six months, have you accidently leaked urine? - N  Do you have problems with loss of bowel control? - N  Managing your Medications? - Y  Comment - son helps  Managing your Finances? - Y  Comment - family helps  Housekeeping or managing your Housekeeping? - Y  Comment - family helps    Fall/Depression Screening: Fall Risk  07/10/2018  Falls in the past year? 1  Number falls in past yr: 0  Injury with Fall? 0  Risk for fall due to : Impaired balance/gait;History of fall(s)  Follow up Falls prevention discussed   PHQ 2/9 Scores 07/10/2018  PHQ - 2 Score 0    Assessment:  Will benefit from continued education and support of chronic medical conditions including Diabetes and high risk for readmission  Plan:  Fall prevention measures reviewed will send EMMI handout on preventing falls in the elderly.  Reinforced notifying MD sooner regarding medical concerns to arrange office visit, verified that patient has [facility] 24 hours nurse advice line.   [facility] CM Care Plan Problem One     Most Recent Value  Care Plan Problem One  High risk for readmission related to recent  discharge and less than 30 day readmit for Acute kidney injury   Role Documenting the Problem One  Care Management Coordinator  Care Plan for Problem One  Active  [facility] Long Term Goal   Patient will not experience and admission over the next 31 days   [facility] Long Term Goal Start Date  07/28/18 Barrie Folk date restart due to readmission ]  Interventions for Problem One Long Term Goal  REview of discharge instructions and follow up reinforced notifying md of new concerns related to coughing up blood increase in weights , review of symptoms of UTI  including fever, malodours urine, change in mental status in the elderly notify MD sooner to arrange office visit , to avoid readmission .    [facility] CM Short Term Goal #1   Over the next 24 days patient will attend all medication appointments   [facility] CM Short Term Goal #1 Start Date  07/28/18 [goal date restart due to admission ]  Interventions for Short Term Goal #1  Reviewed upcoming follow up visit with renal, pcpp  and infusion treatment , reinforced rescheduling pulmonary visit. Encouraged to take all medication bottles to appointments    [facility] CM Short Term Goal #2   Over the next 30 days patient will continue to weigh daily and keep a record .   [facility] CM Short Term Goal #2 Start Date  07/10/18 [goal date restart , readmission ]  Interventions for Short Term Goal #2  Discussed recent weight for last 3 days , reinforced taking daily weight log to MD visit .     [facility] CM Care Plan Problem Two     Most Recent Value  Care Plan Problem Two  Knowledge deficit related to new diagnosis of Diabetes   Role Documenting the Problem Two  Care Management Coordinator  Care Plan for Problem Two  Active  Interventions for Problem Two Long Term Goal   Reinforced taking medications as prescribed, notifying MD sooner for episodes of low blood sugar readings, or elevated, reviewed normal ranges of blood sugar. Asked regarding receiving Diabetes education book, patient son will look for at  home..   [facility] Long Term Goal  Over the next 90 days patient will be able to identify at least measures of self care managment of Diabetes    [facility] Long Term Goal Start Date  07/10/18  [facility] CM Short Term Goal #1   Over the next 30 days patient/son will be able to reports monitioring blood sugar 3 times daily and keeping a record.    [facility] CM Short Term Goal #1 Start Date  07/28/18  Interventions for Short Term Goal #2   Reviewed recent reading , reinforced taking records to PCP next visit   [facility] CM Short Term Goal #2   Patient/son will be able to verbalize 2 symptoms of low blood sugar over the next 30 days   [facility] CM Short Term Goal #2 Start Date  07/28/18  Interventions for Short Term Goal #2  discussed low blood sugar readings symptoms and review of patient current symptoms ,none identified. Reviewed treatment for low blood sugar readings. Will send [facility] calendar book.       [name], RN, [place] Management Coordinator  [phone]- Mobile [phone]- Toll Free Main Office

## 2018-07-28 NOTE — Telephone Encounter (Signed)
Pt was sent back to hospital by Dr. Tobie Poet 07/03/2018. Closing this encounter.

## 2018-07-29 ENCOUNTER — Ambulatory Visit (HOSPITAL_COMMUNITY)
Admission: RE | Admit: 2018-07-29 | Discharge: 2018-07-29 | Disposition: A | Discharge status: Home / Self Care | Payer: Medicare HMO | Source: Ambulatory Visit | Attending: Nephrology | Admitting: Nephrology

## 2018-07-29 ENCOUNTER — Other Ambulatory Visit: Payer: Self-pay

## 2018-07-29 DIAGNOSIS — I776 Arteritis, unspecified: Secondary | ICD-10-CM | POA: Insufficient documentation

## 2018-07-29 MED ORDER — DIPHENHYDRAMINE HCL 50 MG/ML IJ SOLN
25.0000 mg | Freq: Once | INTRAMUSCULAR | Status: AC
  Administered 2018-07-29: 25 mg via INTRAVENOUS

## 2018-07-29 MED ORDER — SODIUM CHLORIDE 0.9 % IV SOLN
700.0000 mg | Freq: Once | INTRAVENOUS | Status: AC
  Administered 2018-07-29: 700 mg via INTRAVENOUS
  Filled 2018-07-29: qty 70

## 2018-07-29 MED ORDER — METHYLPREDNISOLONE SODIUM SUCC 125 MG IJ SOLR
INTRAMUSCULAR | Status: AC
  Filled 2018-07-29: qty 2

## 2018-07-29 MED ORDER — METHYLPREDNISOLONE SODIUM SUCC 125 MG IJ SOLR
125.0000 mg | Freq: Once | INTRAMUSCULAR | Status: AC
  Administered 2018-07-29: 125 mg via INTRAVENOUS

## 2018-07-29 MED ORDER — ACETAMINOPHEN 325 MG PO TABS
650.0000 mg | ORAL_TABLET | Freq: Once | ORAL | Status: AC
  Administered 2018-07-29: 650 mg via ORAL

## 2018-07-29 MED ORDER — ACETAMINOPHEN 325 MG PO TABS
ORAL_TABLET | ORAL | Status: AC
  Filled 2018-07-29: qty 2

## 2018-07-29 MED ORDER — DIPHENHYDRAMINE HCL 50 MG/ML IJ SOLN
INTRAMUSCULAR | Status: AC
  Filled 2018-07-29: qty 1

## 2018-07-30 LAB — ACID FAST CULTURE WITH REFLEXED SENSITIVITIES (MYCOBACTERIA): Acid Fast Culture: NEGATIVE

## 2018-07-31 DIAGNOSIS — N189 Chronic kidney disease, unspecified: Secondary | ICD-10-CM | POA: Diagnosis not present

## 2018-07-31 DIAGNOSIS — D631 Anemia in chronic kidney disease: Secondary | ICD-10-CM | POA: Diagnosis not present

## 2018-07-31 DIAGNOSIS — L89213 Pressure ulcer of right hip, stage 3: Secondary | ICD-10-CM | POA: Diagnosis not present

## 2018-07-31 DIAGNOSIS — L89221 Pressure ulcer of left hip, stage 1: Secondary | ICD-10-CM | POA: Diagnosis not present

## 2018-07-31 DIAGNOSIS — I776 Arteritis, unspecified: Secondary | ICD-10-CM | POA: Diagnosis not present

## 2018-07-31 DIAGNOSIS — J9621 Acute and chronic respiratory failure with hypoxia: Secondary | ICD-10-CM | POA: Diagnosis not present

## 2018-07-31 DIAGNOSIS — N179 Acute kidney failure, unspecified: Secondary | ICD-10-CM | POA: Diagnosis not present

## 2018-08-01 LAB — ACID FAST CULTURE WITH REFLEXED SENSITIVITIES (MYCOBACTERIA): Acid Fast Culture: NEGATIVE

## 2018-08-03 DIAGNOSIS — N3001 Acute cystitis with hematuria: Secondary | ICD-10-CM | POA: Diagnosis not present

## 2018-08-03 DIAGNOSIS — D508 Other iron deficiency anemias: Secondary | ICD-10-CM | POA: Diagnosis not present

## 2018-08-03 DIAGNOSIS — N179 Acute kidney failure, unspecified: Secondary | ICD-10-CM | POA: Diagnosis not present

## 2018-08-03 DIAGNOSIS — E1122 Type 2 diabetes mellitus with diabetic chronic kidney disease: Secondary | ICD-10-CM | POA: Diagnosis not present

## 2018-08-03 DIAGNOSIS — E876 Hypokalemia: Secondary | ICD-10-CM | POA: Diagnosis not present

## 2018-08-03 DIAGNOSIS — I776 Arteritis, unspecified: Secondary | ICD-10-CM | POA: Diagnosis not present

## 2018-08-03 DIAGNOSIS — L89213 Pressure ulcer of right hip, stage 3: Secondary | ICD-10-CM | POA: Diagnosis not present

## 2018-08-03 DIAGNOSIS — N189 Chronic kidney disease, unspecified: Secondary | ICD-10-CM | POA: Diagnosis not present

## 2018-08-03 DIAGNOSIS — E871 Hypo-osmolality and hyponatremia: Secondary | ICD-10-CM | POA: Diagnosis not present

## 2018-08-03 DIAGNOSIS — R600 Localized edema: Secondary | ICD-10-CM | POA: Diagnosis not present

## 2018-08-03 DIAGNOSIS — R042 Hemoptysis: Secondary | ICD-10-CM | POA: Diagnosis not present

## 2018-08-03 DIAGNOSIS — N184 Chronic kidney disease, stage 4 (severe): Secondary | ICD-10-CM | POA: Diagnosis not present

## 2018-08-04 ENCOUNTER — Other Ambulatory Visit: Payer: Self-pay | Admitting: *Deleted

## 2018-08-04 ENCOUNTER — Telehealth: Payer: Self-pay | Admitting: Pulmonary Disease

## 2018-08-04 NOTE — Patient Outreach (Signed)
[facility]) Care Management  [place]  08/05/2018   Julia Johns May 02, 1950 [id number]   Telephone assessment   [name], 68 y/o female currently active with [facility] after recent hospitalization June 12-18, 2020 for acute renal failure; patient was discharged home to self-care with home health services in place.  Unfortunately, patient experienced hospital re-admission 11 days after her previous admission, on June 29- July 22, 2018 for hemoptysis, acute cystitis with hematuria, and hypotension.  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  Today placed call to [facility] home health agency patient active with prior to admission for home PT, spoke with [facility] , that will follow up and return call to me for update.  I will plan return call to patient on today.   Received return call from [place] at [facility] home health that states they will need resumption of home health orders prior to resuming service.   Placed return call to Dr.Cox office to explain need resumption of home health orders, spoke with Kingsley Callander that will follow up with Dr.Cox and contact [facility] home health with needed information. Discussed with her that I have a planned call to patient on today and inform patient/family/.  Successful outreach call to patient with [facility] interpreters, [place]  Again explained reason for the call with patient,  She discussed that she is doing just fine, walking better and doing better. Patient discussed moving around home more today and did some cooking.  Patient discussed that she is taking her medications, but doesn't like too , but knows that  is helps her kidney's and her to get better.  Patient discussed still having some swelling in her legs but a little better. She discussed her weight today was 184, but she does not weigh everyday, advised patient about importance of weighing daily.  Reviewed importance of being able to recognize sudden weight gain when she monitors it daily. She states that she get back to that.   Patient gave verbal consent to speak with her son [name] as well as [name] on her behalf has they help her every day. Discussed with patient how she prefers to receive written information, she reads spanish mostly , little english but [name] and [employer] and help her to understand.   Spoke with [name] patient , daughter in law she further discussed :  Diabetes  She reports checking patient blood sugar at least twice daily it is a little higher in the evening . She discussed patient likes to eat several times throughout the day.   She discussed patient blood sugar 120- in the morning and higher in the evening in the 200 to 300, she is keeping a written record, she reports doing so and reviewed at PCP visit .  Review of high and low blood sugar readings and importance of notifying MD of consistent high level readings.  She discussed patient being on insulin prior to recent hospitalization, but not at this time.   UTI [name] discussed patient with diagnosis of UTI at PCP visit on yesterday . She state they will pick up antibiotic on today , advised regarding the importance to starting prescription, and completing full prescription. Review  of symptoms of UTI to notify MD of .   Chronic kidney disease/Vaculitis  [name] discussed patient having last infusion treatment on tomorrow. She discussed patient recent visit to Kidney doctor, she voiced having question regarding patient progress, is treatment working, how much longer she will be on prednisone, she discussed placing call to [place] office and leaving a  message.    Objective: Ht 1.422 m (4\' 8" )   Wt 184 lb (83.5 kg)   BMI 41.25 kg/m   Encounter Medications:  Outpatient Encounter Medications as of 08/04/2018  Medication Sig Note  . acetaminophen (TYLENOL) 325 MG tablet Take 2 tablets (650 mg total) by mouth every 6 (six) hours as needed for mild pain (or Fever >/= 101).   . furosemide (LASIX) 80 MG tablet Take 0.5 tablets (40 mg total) by mouth 2 (two) times daily.   Marland Kitchen glipiZIDE (GLUCOTROL) 5 MG tablet Take 0.5 tablets (2.5 mg total) by mouth 2 (two) times daily before a meal.   . potassium chloride (K-DUR) 10 MEQ tablet Take 1 tablet (10 mEq total) by mouth daily.   . predniSONE (DELTASONE) 20 MG tablet Take 3 tablets (60 mg total) by mouth daily with breakfast.   . senna-docusate (SENOKOT-S) 8.6-50 MG tablet Take 1 tablet by mouth 2 (two) times daily. (Patient not taking: Reported on 07/10/2018) 07/03/2018: Have not started yet   No facility-administered encounter medications on file as of 08/04/2018.     Functional Status:  In your present state of health, do you have any difficulty performing the following activities: 07/21/2018 07/10/2018  Hearing? N N  Vision? N N  Difficulty concentrating or making decisions? N N  Walking or climbing stairs? Y Y  Comment - uses walker  Dressing or bathing? Y Y  Comment - needs help from family  Doing errands, shopping? Y Y  Comment - family helps  Preparing Food and eating ? - Y  Comment - familly helps  In the past six months, have you accidently leaked urine? - N  Do you have problems with loss of bowel control? - N  Managing your Medications? - Y  Comment - son helps  Managing your Finances? - Y  Comment - family helps  Housekeeping or managing your Housekeeping? - Y  Comment - family helps    Fall/Depression Screening: Fall Risk  07/10/2018  Falls in the past year? 1  Number falls in past yr: 0  Injury with Fall? 0  Risk for fall due to : Impaired balance/gait;History of fall(s)   Follow up Falls prevention discussed   PHQ 2/9 Scores 07/10/2018  PHQ - 2 Score 0    Assessment:   Diabetes - patient/family will benefit from ongoing education/support for diabetes management  Vasculitis / CKD- final [name] infusion on 7/15. Patient caregivers with questions regarding next follow up plan, how much longer she will be on prednisone and is infusion working . Placed call to [place] office able to leave a message on nurse line Deann to follow up with patient family regarding questions and concerns .  Education on weight monitoring , best time of day to weight , for consistent weight monitoring.  Falls  No recent falls, using walker, will benefit from home therapy as recommended. Patient has someone in home with her at all times.   Plan:  Will plan follow up call in the next 3 days to verify home health services in place.  Fall prevention measures reviewed.  Will send PCP this initial assessment  note.  Will send EMMI handout of Chronic kidney disease Will send EMMI on Diabetes and diet , english for family to assist.    [facility] CM Care Plan Problem One     Most Recent Value  Care Plan Problem One  High risk for readmission related to recent discharge and less than 30 day readmit for Acute kidney injury   Role Documenting the Problem One  Care Management [place] for Problem One  Active  [facility] Long Term Goal   Patient will not experience and admission over the next 31 days   [facility] Long Term Goal Start Date  07/28/18 Barrie Folk date restart due to readmission ]  Interventions for Problem One Long Term Goal  Discussed patient current clinical state, reinforced importance of taking all medications as prescribed, to treat conditons . Will send EMMI on chronic kidney disease .    [facility] CM Short Term Goal #1   Over the next 24 days patient will attend all medication appointments   [facility] CM Short Term Goal #1 Start Date  07/28/18 Barrie Folk date restart due to admission ]   Interventions for Short Term Goal #1  Reviewed follow up visits with PCP , weekly lab work and infusion treatment for 7/15.   [facility] CM Short Term Goal #2   Over the next 30 days patient will continue to weigh daily and keep a record .   [facility] CM Short Term Goal #2 Start Date  07/28/18  Interventions for Short Term Goal #2  Reviewed with patient again the best time of day to weigh , each morning after going to bathroom and before getting dressed , for consistency . Advised regarding sudden weight gain of 2 pounds in a day and 5 in a week, increased swelling or sob to notify MD     [facility] CM Care Plan Problem Two     Most Recent Value  Care Plan Problem Two  Knowledge deficit related to new diagnosis of Diabetes   Role Documenting the Problem Two  Care Management Coordinator  Care Plan for Problem Two  Active  Interventions for Problem Two Long Term Goal   Discussed measures of blood sugar control , monitoring , diet managment , taking medications helps with control of condiiton..   [facility] Long Term Goal  Over the next 90 days patient will be able to identify at least measures of self care managment of Diabetes    [facility] Long Term Goal Start Date  07/10/18  [facility] CM Short Term Goal #1   Over the next 30 days patient/son will be able to reports monitioring blood sugar 3 times daily and keeping a record.    [facility] CM Short Term Goal #1 Start Date  07/28/18  Interventions for Short Term Goal #2   Reviewed with family continuing to montior blood sugars and encouraged to keep a record,. encouraged to notify MD of consistent elevations over 200 to MD , discussed how information helps with managing blood sugar control   [facility] CM Short Term Goal #2   Patient/son will be able to verbalize 2 symptoms of low blood sugar over the next 30 days   [facility] CM Short Term Goal #2 Start Date  07/28/18  Interventions for Short Term Goal #2  Review with caregiver , blood sugar readings confirmed no low readings, Reviewed info in diabetes book  on treating low sugar episodes   [facility] CM Short Term Goal #3   Over the next 30 days patient  will be able to report meal planning using appropriate foods    [facility] CM Short Term Goal #3 Start Date  08/04/18  Interventions for Short Term Goal #3  Discussed with patient/family how appropriate food intake helps with blood sugar control ,eating meals at regular times. Referenced plate method in diabetes book. Will send EMMI on Diabetes and Diet emmi for review.        [name], RN, [place] Management Coordinator  [phone]- Mobile [phone]- Toll Free Main Office

## 2018-08-04 NOTE — Telephone Encounter (Signed)
LMTCB for pt via [place] interpreters.

## 2018-08-04 NOTE — Telephone Encounter (Signed)
Please arrange for FU OV for imaging in 4-6 weeks

## 2018-08-05 ENCOUNTER — Ambulatory Visit (HOSPITAL_COMMUNITY)
Admission: RE | Admit: 2018-08-05 | Discharge: 2018-08-05 | Disposition: A | Discharge status: Home / Self Care | Payer: Medicare HMO | Source: Ambulatory Visit | Attending: Nephrology | Admitting: Nephrology

## 2018-08-05 ENCOUNTER — Other Ambulatory Visit: Payer: Self-pay

## 2018-08-05 ENCOUNTER — Encounter (HOSPITAL_COMMUNITY): Payer: Self-pay

## 2018-08-05 DIAGNOSIS — I776 Arteritis, unspecified: Secondary | ICD-10-CM | POA: Diagnosis not present

## 2018-08-05 MED ORDER — ACETAMINOPHEN 325 MG PO TABS
ORAL_TABLET | ORAL | Status: AC
  Administered 2018-08-05: 650 mg via ORAL
  Filled 2018-08-05: qty 2

## 2018-08-05 MED ORDER — ACETAMINOPHEN 325 MG PO TABS
650.0000 mg | ORAL_TABLET | Freq: Once | ORAL | Status: AC
  Administered 2018-08-05: 650 mg via ORAL

## 2018-08-05 MED ORDER — DIPHENHYDRAMINE HCL 50 MG/ML IJ SOLN
25.0000 mg | Freq: Once | INTRAMUSCULAR | Status: AC
  Administered 2018-08-05: 09:00:00 25 mg via INTRAVENOUS

## 2018-08-05 MED ORDER — SODIUM CHLORIDE 0.9 % IV SOLN
700.0000 mg | Freq: Once | INTRAVENOUS | Status: AC
  Administered 2018-08-05: 700 mg via INTRAVENOUS
  Filled 2018-08-05: qty 70

## 2018-08-05 MED ORDER — DIPHENHYDRAMINE HCL 50 MG/ML IJ SOLN
INTRAMUSCULAR | Status: AC
  Administered 2018-08-05: 25 mg via INTRAVENOUS
  Filled 2018-08-05: qty 1

## 2018-08-05 MED ORDER — METHYLPREDNISOLONE SODIUM SUCC 125 MG IJ SOLR
INTRAMUSCULAR | Status: AC
  Administered 2018-08-05: 125 mg via INTRAVENOUS
  Filled 2018-08-05: qty 2

## 2018-08-05 MED ORDER — METHYLPREDNISOLONE SODIUM SUCC 125 MG IJ SOLR
125.0000 mg | Freq: Once | INTRAMUSCULAR | Status: AC
  Administered 2018-08-05: 09:00:00 125 mg via INTRAVENOUS

## 2018-08-06 ENCOUNTER — Other Ambulatory Visit: Payer: Self-pay | Admitting: *Deleted

## 2018-08-06 NOTE — Patient Outreach (Signed)
[name]) Care Management  08/06/2018  Julia Johns 24-Aug-1950 [id number]   Patient case has been discussed in multidisciplinary case discussion .   [name], RN, [place] Management Coordinator  [phone]- Mobile [phone]- Toll Free Main Office

## 2018-08-07 ENCOUNTER — Other Ambulatory Visit: Payer: Self-pay | Admitting: *Deleted

## 2018-08-07 NOTE — Patient Outreach (Signed)
[facility]) Care Management  08/07/2018  Julia Johns Jan 24, 1950 [id number]  Telephone assessment   Julia Johns, 68 y/o femalecurrently active with [place] after recent hospitalizationJune 12-18, 2020 for acute renal failure; patient was discharged home to self-care with home health services in place. 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   Placed call to patient son Julia Johns , HIPAA verified x 2 identifiers. He states that Julia Johns is at home and is able to speak with me. Julia Johns also states patient is doing a little better she working on getting her to eat better.  She discussed  Chronic kidney disease follow up  Julia Johns discussed that she has been able to speak with nurse at [place] office to follow up on patient plan. She reports patient received new prescription for prednisone and they are tapering dose and understands importance of not abruptly  stopping medications . She also clarified Lasix dose is 80 mg once daily ant to notify office of increase in swelling.  She reports patient weight has been stable at 180 lbs for the last 2 days. She discussed patient with some improvement in swelling of lower legs. She explained that patient will need to begin weekly injections for kidney condition, office to follow up with more information , she has follow up visit with Dr.Patel in August. Reinforced importance on continuing daily weights, reviewed sudden weight gains of 2 pounds in a day  and 5 in  Week to notify MD.  Diabetes  Family assisting patient with monitoring blood sugar twice daly before meals, she usually runs 80 in the morning and 200 to 300 in the evening. Son explained patient is at home during the day with the son/girlfriend and patient food choices/amounts are a little more . Patient son and daughter in law expressed continuing to work on patient portion size and food choices,they are preparing fresh vegetables , including protein with meals and working on making changes in portions sizes. Will continue education and support on meal planning and diabetes management, discussed notifying MD of consistent higher blood sugars in the evening. Family states next office visit next month.  UTI Verified patient has received antibiotic prescriptions and taking. Family reports patient is doing overall better with being agreeable with taking medications.  Fall risk/Home health  No recent falls, per patient son he has not received call regarding patient home health services. Patient currently using walker in the home and tolerating, tires easily at times. Will benefit from home therapy for increase strength balance for fall prevention .     Plan Placed call to [facility] office, they verify that they have not received resumption orders for home health .  Will plan return call to PCP office to follow up on home health orders on next business day as placed call today and office is closed.   THN CM Care Plan Problem One     Most Recent Value  Care Plan Problem One  High risk for readmission related to recent discharge and less than 30 day readmit for Acute  kidney injury   Role Documenting the Problem One  Care Management Coordinator  Care Plan for Problem One  Active  THN Long Term Goal   Patient will not experience and admission over the next 31 days   THN Long Term Goal Start Date  07/28/18 Barrie Folk date restart due to readmission ]  Interventions for Problem One Long  Term Goal  Confirm patient has obtained prescriptons for UTI and began taking . REviewed symptoms of unresolved UTI, burning  when urinating , odor to urine , signs of temperature.    THN CM Short Term Goal #1   Over the next 24 days patient will attend all medication appointments   THN CM Short Term Goal #1 Start Date  07/28/18 [goal date restart due to admission ]  Interventions for Short Term Goal #1  Discuss follow up with pulmonary MD as patient in hospital at last scheduled visit .   THN CM Short Term Goal #2   Over the next 30 days patient will continue to weigh daily and keep a record .   THN CM Short Term Goal #2 Start Date  07/28/18  Interventions for Short Term Goal #2  Discussed current weights , and no increases, Review of sudden weight gain parameters to call Dr. Posey Pronto about, weght gain of 2 pounds in a day and 5 in a week. increase in swelling or noted shortness of breath .     [facility] CM Care Plan Problem Two     Most Recent Value  Care Plan Problem Two  Knowledge deficit related to new diagnosis of Diabetes   Role Documenting the Problem Two  Care Management Coordinator  Care Plan for Problem Two  Active  Interventions for Problem Two Long Term Goal   Review of patient current clinical state, and reinforced blood control , includes taking medication as prescribed, balanced meal planning and being activity as tolerated .   THN Long Term Goal  Over the next 90 days patient will be able to identify at least measures of self care managment of Diabetes    THN Long Term Goal Start Date  07/10/18  THN CM Short Term Goal #1   Over the next 30 days patient/son will be able to reports monitioring blood sugar 2 times daily and keeping a record.   Geralyn Flash per MD goals ]  [facility] CM Short Term Goal #1 Start Date  07/28/18  Interventions for Short Term Goal #2   Discusesd recent blood sugars readings, stressed importance of continuing to monitor , keep  record . Stressed importance of notifying MD of  increased occurence of reading in the 200 range   THN CM Short Term Goal #2   Patient/son will be able to verbalize 2 symptoms of low blood sugar over the next 30 days   THN CM Short Term Goal #2 Start Date  07/28/18  Interventions for Short Term Goal #2  Discussed if patient has had low readings, review with teachback  how to treat reinforced importance of recheck to verify blood sugar above 70  THN CM Short Term Goal #3   Over the next 30 days patient will be able to report meal planning using appropriate foods    THN CM Short Term Goal #3 Start Date  08/04/18  Interventions for Short Term Goal #3  Verified patient recieving education material , reviewed plate method and balanced meals to keep blood sugar reading stable, will reinforce . Discussed patient usual meals and likes and  review portions sizes       [name], RN, [place] Management Coordinator  [phone]- Mobile [phone]- Toll Free Main Office

## 2018-08-10 ENCOUNTER — Other Ambulatory Visit: Payer: Self-pay | Admitting: *Deleted

## 2018-08-10 DIAGNOSIS — N179 Acute kidney failure, unspecified: Secondary | ICD-10-CM | POA: Diagnosis not present

## 2018-08-10 DIAGNOSIS — N189 Chronic kidney disease, unspecified: Secondary | ICD-10-CM | POA: Diagnosis not present

## 2018-08-10 NOTE — Patient Outreach (Signed)
[facility]) Care Management  08/10/2018  Julia Johns 10/10/1950 [id number]   Care Coordination call    Outreach call to PCP office to follow up on Home health order for care resumption , home PT. Spoke with [name], RN to explain patient home health services had not been resumed since her recent discharge home after readmission. Explained speaking with [facility] and they stated needing orders to resume therapy after admission.   Patient would benefit from home therapy evaluation and treatment to increase strength and balance , as she initially only received one visit prior to readmission, if Dr.Cox is in agreement .  Nurse at office to will follow with Dr.Cox and home health services.   Plan Will plan return call to follow up with patient/son  in the next 3 business days   [name], RN, [place] Management Coordinator  [phone]- Mobile [phone]- [place]

## 2018-08-13 ENCOUNTER — Other Ambulatory Visit: Payer: Self-pay | Admitting: *Deleted

## 2018-08-13 NOTE — Patient Outreach (Signed)
[facility]) Care Management  08/13/2018  Landa Mullinax [birth year] [id number]   Care Coordination call  Telephone assessment    Care Coordination call to [place] to discuss if agency had received resumption of home health orders, Judeen Hammans representative checked and no new orders.   Outreach call to patient son [name] to discuss if he had received call regarding any home health service he states that he has not .  He further discussed:  Self health management of chronic disease states, Diabetes, chronic kidney disease.  - Patient discussed overall that patient is doing better, she seems to have more energy moving around home more , cooking some of the meals at home.  -Diabetes Family continues to help with monitoring blood sugars son states reading usually in the 100-110 range in the morning and in the evening they have had reading at 300 range  and at least 3 times in the last week, he doesn't have the book in front of him, but they occasionally forget and have checked reading after the meals .  Discussed with family how blood sugar begins to spike after eating , and how much it goes up depends on what she eats. Son discussed they continue to work on focusing on portion controls and starchy foods of her culture .   Chronic Kidney disease Son discussed patient with decrease in swelling of lower legs, Daughter in law [name] has received call from [place] with additional instructions on increasing lasix to twice daily in the am and at 2 pm he is unsure of dosage as [name] helps with that. She has also received communication that tapering prednisone and it should be completed by July 31.  Patient continues to weight daily with weight for the last 3 days. Patient son is able to accurately identify worsening weight gain guideline with action of notifying MD.   Social Patient son discussed patient may benefit from home services to make sure she gets  stronger as she attempt to not sure walker at times. Advised regarding continuing to use walker for safety and to prevent falls. Discussed that I will follow up with Dr.Cox office.   Appointments  Discussed with patient son , upcoming medical visits Dr.Patel visit for August has received appointment letter.   Dr.Cox visit scheduled for September Patient had missed appointment with Pulmonary office during readmission , son agreeable to calling to reschedule he verifies [name] will have contact number .    Care Coordination  Placed call to Dr.Cox, spoke with nurse [name] that states that she has sent Home health orders to number provided by family to [facility] on July 14 as she states Dr.Cox wanted home health to continue to see patient as she was concerned regarding as skin area on leg noted at office visit in the 2 weeks. Provided [name] contact number to [facility] health that previously had home visit with patient but that states they have not received orders to resume care, discussed providing patient son number as contact as patient does not usually  answer the phone Discussed with [name] family report of patient blood sugars in the am range 100-110 and some reading in the evening in the 300 and over range. She discussed patient next office visit in 2 months at this time.   Patient son denies any further concerns or problem other than what has already been discussed. Encouraged patient family to contact me directly if questions or concerns verified my contact number. Marland Kitchen  Plan Will plan follow up call in the next week to inquires regarding home health and next 2  to make contact with patient regarding Diabetes continued education and support  Patient family will continue to monitor blood sugar and encouraged to notify of consistent elevations of blood  sugar 250- 300.   THN CM Care Plan Problem One     Most Recent Value  Care Plan Problem One  High risk for readmission related  to recent discharge and less than 30 day readmit for Acute kidney injury   Role Documenting the Problem One  Care Management [place] for Problem One  Active  [facility] Long Term Goal   Patient will not experience and admission over the next 31 days   THN Long Term Goal Start Date  07/28/18 Barrie Folk date restart due to readmission ]  Interventions for Problem One Long Term Goal  Reviewed patient clinical condition, care coordination call to PCP office regarding home health service for home physical therapy.   THN CM Short Term Goal #1   Over the next 24 days patient will attend all medication appointments   THN CM Short Term Goal #1 Start Date  07/28/18 [goal date restart due to admission ]  Interventions for Short Term Goal #1  Reviewed upcoming medical appointments with Dr.Patel,  Dr.Cox, and missed pulmonary MD visit that was missed during readmission , son agrees to reschedule visit.   THN CM Short Term Goal #2   Over the next 30 days patient will continue to weigh daily and keep a record .   THN CM Short Term Goal #2 Start Date  07/28/18  Interventions for Short Term Goal #2  reviewed current weights,and  teachback on recognizing weight gain guideline action plan on notifying MD      [facility] CM Care Plan Problem Two     Most Recent Value  Care Plan Problem Two  Knowledge deficit related to new diagnosis of Diabetes   Role Documenting the Problem Two  Care Management Coordinator  Care Plan for Problem Two  Active  Interventions for Problem Two Long Term Goal   Reviewed with son,  patient current clinical state  and discussed self management skills of diet, management , medication management work together to help with controlling blood sugar. Discussed benefits of activity how that helps decreasing blood sugar   THN Long Term Goal  Over the next 90 days patient will be able to identify at least measures of self care managment of Diabetes    THN Long Term Goal Start Date  07/10/18  THN CM  Short Term Goal #1   Over the next 30 days patient/son will be able to reports monitioring blood sugar 2 times daily and keeping a record.   Geralyn Flash per MD goals ]  [facility] CM Short Term Goal #1 Start Date  07/28/18  Interventions for Short Term Goal #2   REviewed recent reading and commended for continuing to check, reinforced keeping a written record for easy reveiw.   THN CM Short Term Goal #2   Patient/son will be able to verbalize 2 symptoms of low blood sugar over the next 30 days   THN CM Short Term Goal #2 Start Date  07/28/18  Interventions for Short Term Goal #2  Assessed for patient recent episode and teachback on how to treat reinforced checking after 15 minutes and having next meal or snack with carbohydrate protein   THN CM Short Term Goal #3  Over the next 30 days patient will be able to report meal planning using appropriate foods    THN CM Short Term Goal #3 Start Date  08/04/18  Interventions for Short Term Goal #3  Reviewed importance of portion control of patient foods especially rice, tortilla , can be eaten portion size can be adjusted , discussed with patient , verifed education handout has been received  and caregivers  reviewing        [name], RN, [place] Management Coordinator  [phone]- Mobile [phone]- [place]

## 2018-08-15 DIAGNOSIS — Z7951 Long term (current) use of inhaled steroids: Secondary | ICD-10-CM | POA: Diagnosis not present

## 2018-08-15 DIAGNOSIS — D649 Anemia, unspecified: Secondary | ICD-10-CM | POA: Diagnosis not present

## 2018-08-15 DIAGNOSIS — E119 Type 2 diabetes mellitus without complications: Secondary | ICD-10-CM | POA: Diagnosis not present

## 2018-08-15 DIAGNOSIS — R918 Other nonspecific abnormal finding of lung field: Secondary | ICD-10-CM | POA: Diagnosis not present

## 2018-08-15 DIAGNOSIS — S70311D Abrasion, right thigh, subsequent encounter: Secondary | ICD-10-CM | POA: Diagnosis not present

## 2018-08-15 DIAGNOSIS — E669 Obesity, unspecified: Secondary | ICD-10-CM | POA: Diagnosis not present

## 2018-08-15 DIAGNOSIS — Z7984 Long term (current) use of oral hypoglycemic drugs: Secondary | ICD-10-CM | POA: Diagnosis not present

## 2018-08-15 DIAGNOSIS — R222 Localized swelling, mass and lump, trunk: Secondary | ICD-10-CM | POA: Diagnosis not present

## 2018-08-15 DIAGNOSIS — Z8701 Personal history of pneumonia (recurrent): Secondary | ICD-10-CM | POA: Diagnosis not present

## 2018-08-17 DIAGNOSIS — N179 Acute kidney failure, unspecified: Secondary | ICD-10-CM | POA: Diagnosis not present

## 2018-08-17 DIAGNOSIS — K219 Gastro-esophageal reflux disease without esophagitis: Secondary | ICD-10-CM | POA: Diagnosis not present

## 2018-08-17 DIAGNOSIS — E1122 Type 2 diabetes mellitus with diabetic chronic kidney disease: Secondary | ICD-10-CM | POA: Diagnosis not present

## 2018-08-17 DIAGNOSIS — R0602 Shortness of breath: Secondary | ICD-10-CM | POA: Diagnosis not present

## 2018-08-17 DIAGNOSIS — N189 Chronic kidney disease, unspecified: Secondary | ICD-10-CM | POA: Diagnosis not present

## 2018-08-17 DIAGNOSIS — R0789 Other chest pain: Secondary | ICD-10-CM | POA: Diagnosis not present

## 2018-08-19 ENCOUNTER — Other Ambulatory Visit: Payer: Self-pay | Admitting: *Deleted

## 2018-08-19 DIAGNOSIS — D631 Anemia in chronic kidney disease: Secondary | ICD-10-CM | POA: Diagnosis not present

## 2018-08-19 DIAGNOSIS — N189 Chronic kidney disease, unspecified: Secondary | ICD-10-CM | POA: Diagnosis not present

## 2018-08-19 DIAGNOSIS — N179 Acute kidney failure, unspecified: Secondary | ICD-10-CM | POA: Diagnosis not present

## 2018-08-19 NOTE — Patient Outreach (Signed)
[name]) Care Management  [place]  08/19/2018   Julia Johns 12-15-1950 [id number]    Telephone follow up assessment  Care Coordination .    [name], 68 y/o femalecurrently active with [name] after recent hospitalizationJune 12-18, 2020 for acute renal failure; patient was discharged home to self-care with home health services in place. Unfortunately, patient experienced hospital re-admission 11 days after her previous admission, on June 29- July 22, 2018 for hemoptysis, acute cystitis with hematuria, and hypotension.  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  Subjective:   Successful outreach call to patient son to follow up on home health orders.  [name] discussed patient had recent visit with Dr.Cox on 7/27. He discussed patient is doing much better, reports area previously observed to her leg is now healed, reports area reviewed at MD visit and patient reports improvement and mobility and energy improved discussed at visit per patient son.   Patient declines need for home health services  she is tolerating activity in the home using her rolling walker, no recent falls. Per [name] patient does not want anyone else coming to her home during the day.  Patient son further discussed   Self health management of chronic disease states, Diabetes, chronic kidney disease.  Diabetes Family continues to assist patient with monitoring of blood sugars twice daily, noted am blood sugars in the 90's and evening in the 200-300. Patient son reports Glipzide increased at visit on Monday to 1/2 tablet in the am and 1 tablet in the pm. Son verified that they have adjusted patient to new dosing and along with diet modifications and increased activity in home as tolerated will help with control of diabetes and blood sugar range.  Patient son discussed they  are still working on diet changes, reviewed importance of portion size, related to patient favorite/usual diet that consist a large part  of corn/rice/beans, working on adding in more green vegetables too.  Chronic Kidney Disease Patient continuing to weigh daily , home weights staying in the 175 range, improvement in swelling in her legs , looking good per son report.  Patient continues with weekly injections at [facility] office for chronic kidney condition until next follow up with nephrology in August per patient son, as well he reports this was the final week of every Monday lab work done at [facility].  Patient son able to identify worsening of weight gain guideline for notifying MD of weight increases and swelling or shortness of breath.   Appointment  Dr. Posey Pronto in August  Dr. Tobie Poet September  Dr. Volanda Napoleon, August 3 pulmonary post hospital visit     Encounter Medications:  Outpatient Encounter Medications as of 08/19/2018  Medication Sig Note  . acetaminophen (TYLENOL) 325 MG tablet Take 2 tablets (650 mg total) by mouth every 6 (six) hours as needed for mild pain (or Fever >/= 101).   . furosemide (LASIX) 80 MG tablet Take 0.5 tablets (40 mg total) by mouth 2 (two) times daily.   Marland Kitchen glipiZIDE (GLUCOTROL) 5 MG tablet Take 0.5 tablets (2.5 mg total) by mouth 2 (two) times daily before a meal.   . potassium chloride (K-DUR) 10 MEQ tablet Take 1 tablet (10 mEq total) by mouth daily.   . predniSONE (DELTASONE) 20 MG tablet Take 3 tablets (60 mg total) by mouth daily with breakfast.   . senna-docusate (SENOKOT-S) 8.6-50 MG tablet Take 1 tablet  by mouth 2 (two) times daily. (Patient not taking: Reported on 07/10/2018) 07/03/2018: Have not started yet   No facility-administered encounter medications on file as of 08/19/2018.     Functional Status:  In your present state of health, do you have any difficulty performing the following activities: 07/21/2018 07/10/2018  Hearing? N N  Vision? N N   Difficulty concentrating or making decisions? N N  Walking or climbing stairs? Y Y  Comment - uses walker  Dressing or bathing? Y Y  Comment - needs help from family  Doing errands, shopping? Y Y  Comment - family helps  Preparing Food and eating ? - Y  Comment - familly helps  In the past six months, have you accidently leaked urine? - N  Do you have problems with loss of bowel control? - N  Managing your Medications? - Y  Comment - son helps  Managing your Finances? - Y  Comment - family helps  Housekeeping or managing your Housekeeping? - Y  Comment - family helps    Fall/Depression Screening: Fall Risk  07/10/2018  Falls in the past year? 1  Number falls in past yr: 0  Injury with Fall? 0  Risk for fall due to : Impaired balance/gait;History of fall(s)  Follow up Falls prevention discussed   PHQ 2/9 Scores 07/10/2018  PHQ - 2 Score 0    Assessment  Patient with improvement with mobility  Family consistently helping monitoring twice daily blood sugars, will benefit from continued education and support of diabetes education management. Patient with help from family continues with daily weight monitoring and action to take if worsening symptoms.  Patient attending regularly scheduled office visits.   Plan  Will continue education and support of Diabetes self health management . Will plan return call to patient in the next week.   THN CM Care Plan Problem One     Most Recent Value  Care Plan Problem One  High risk for readmission related to recent discharge and less than 30 day readmit for Acute kidney injury   Role Documenting the Problem One  Care Management [place] for Problem One  Active  [facility] Long Term Goal   Patient will not experience and admission over the next 31 days   THN Long Term Goal Start Date  07/28/18 Barrie Folk date restart due to readmission ]  Interventions for Problem One Long Term Goal  Discussed patient clinical state at present,  medication adjustments at recent PCP visit and adherence to medical plan for follow up, medications and to notify MD of worsening of symptoms or concerns sooner to address issue to avoid hospital admission.   THN CM Short Term Goal #1   Over the next 24 days patient will attend all medication appointments   THN CM Short Term Goal #1 Start Date  07/28/18 [goal date restart due to admission ]  Interventions for Short Term Goal #1  Again revivewed upcoming post hospital visit with pulmonary and follow up with PCP and nephrology , has time and dates of visit and transportation in place and family to attend with patient .   THN CM Short Term Goal #2   Over the next 30 days patient will continue to weigh daily and keep a record .   THN CM Short Term Goal #2 Start Date  07/28/18  Interventions for Short Term Goal #2  Again encouraged rationale for continued monitoring of weights, teach back on weight gain measurement to call MD  about.     [facility] CM Care Plan Problem Two     Most Recent Value  Care Plan Problem Two  Knowledge deficit related to new diagnosis of Diabetes   Role Documenting the Problem Two  Care Management Coordinator  Care Plan for Problem Two  Active  Interventions for Problem Two Long Term Goal   Reinforced adherence to new medication changes with diabetes plan, notify MD of low reading of 70 , teachback of symptoms and how to treat , encouraged to notify MD of low sugar or continued high reading over 250 .   THN Long Term Goal  Over the next 90 days patient will be able to identify at least measures of self care managment of Diabetes    THN Long Term Goal Start Date  07/10/18  THN CM Short Term Goal #1   Over the next 30 days patient/son will be able to reports monitioring blood sugar 2 times daily and keeping a record.   Geralyn Flash per MD goals ]  [facility] CM Short Term Goal #1 Start Date  07/28/18  Interventions for Short Term Goal #2   Encouraged to continue monitor daily and keeping a record  and take to PCP visit, Discussed how information gives idea of control of blood sugar. Reviewed recent blood sugars readings.   THN CM Short Term Goal #2   Patient/son will be able to verbalize 2 symptoms of low blood sugar over the next 30 days   THN CM Short Term Goal #2 Start Date  07/28/18  [facility] CM Short Term Goal #2 Met Date  08/19/18  THN CM Short Term Goal #3   Over the next 30 days patient will be able to report meal planning using appropriate foods    THN CM Short Term Goal #3 Start Date  08/04/18  Interventions for Short Term Goal #3  Discussed portions sizes review of plate method , referring to diabetes education book with vary of food choices. Reviewed carb , protein and vegetables choices to include in balanced meals , with reference to serving size.        [name], RN, [place] Management Coordinator  [phone]- Mobile [phone]- Toll Free Main Office

## 2018-08-24 ENCOUNTER — Ambulatory Visit (INDEPENDENT_AMBULATORY_CARE_PROVIDER_SITE_OTHER): Payer: Medicare HMO

## 2018-08-24 ENCOUNTER — Ambulatory Visit (INDEPENDENT_AMBULATORY_CARE_PROVIDER_SITE_OTHER): Payer: Medicare HMO | Admitting: Primary Care

## 2018-08-24 ENCOUNTER — Other Ambulatory Visit: Payer: Self-pay

## 2018-08-24 ENCOUNTER — Encounter: Payer: Self-pay | Admitting: Primary Care

## 2018-08-24 VITALS — BP 100/60 | HR 97 | Temp 98.5°F | Ht <= 58 in | Wt 179.6 lb

## 2018-08-24 DIAGNOSIS — R918 Other nonspecific abnormal finding of lung field: Secondary | ICD-10-CM

## 2018-08-24 DIAGNOSIS — J181 Lobar pneumonia, unspecified organism: Secondary | ICD-10-CM | POA: Diagnosis not present

## 2018-08-24 DIAGNOSIS — R042 Hemoptysis: Secondary | ICD-10-CM | POA: Diagnosis not present

## 2018-08-24 DIAGNOSIS — N189 Chronic kidney disease, unspecified: Secondary | ICD-10-CM | POA: Diagnosis not present

## 2018-08-24 DIAGNOSIS — R05 Cough: Secondary | ICD-10-CM

## 2018-08-24 DIAGNOSIS — R058 Other specified cough: Secondary | ICD-10-CM

## 2018-08-24 DIAGNOSIS — R12 Heartburn: Secondary | ICD-10-CM | POA: Insufficient documentation

## 2018-08-24 NOTE — Assessment & Plan Note (Signed)
-   Clinically improved - Denies cough or shortness of breath. No further hemoptysis - Treated with vancomycin and meropenem

## 2018-08-24 NOTE — Patient Instructions (Addendum)
   Orders: CXR today  Referral: GI in [place] BU:[id number]   Follow-up Dr. [name] in 2-3 months (or next available) Return sooner if you develop shortness of breath, wheezing or cough with blood

## 2018-08-24 NOTE — Progress Notes (Addendum)
@Patient  ID: Julia Johns, female    DOB: 08-18-1950, 68 y.o.   MRN: [id number]  Chief Complaint  Patient presents with  . Hospitalization Follow-up    hosp f/u, reports chest tightness on exertion & dry cough     Referring provider: [name], MD  HPI: 68 year old female, never smoked. PMH significant for hypotension, lobar pneumonia, acute respiratory failure with hypoxia, colitis, acute renal failure, CKD, hemoptysis, paravertebral mass, lung mass, obesity. Patient of Dr. [name], consulted in-patient. New to [facility] pulmonary office.   Hospital admission 06/09/18-06/26/18: Admitted from [facility] with sob and blood tinge sputum, left thoracic pain and midsternal chest tightness. MRI of T-spine showed paraspinal soft tissue thickening T6-1- concerning for malignancy. CTA negative for PE but prevertebral densities (malignacy vs infection) and LLL density 4.5x2.9cm concerning for mass with surrounding infiltrate. Transferred to [facility]. Covid test negative. CT abd/pelvis showed sigmoid diverticulosis, bilateral pleural effusions with associated atelectasis or consolidation and large masslike consolidation of dependent left lung base measuring 4.9cm. Bronchoscopy on 6/4 of left lower lobe pathology showing atypical squamous cells favored to be reactive.  Treated with vancomycin and meropenem.   08/24/2018 Patient presents today for hospital follow-up. Son accompanied her. She is doing ok. She has had not further episodes of hemoptysis. Very rare cough. No shortness of breath or wheezing. Complains of epigastric pain/chest tightness when eating food x2 weeks. Experiences a lot of acid reflux. Started taking omeprazole 20mg  in the morning 1 week ago. No nausea or vomiting.   Allergies  Allergen Reactions  . Penicillins Anaphylaxis    Patient stated that within a few minutes of receiving a penicillin IM injection she rapidly became unconscious and required "additional" medication to  recover. This occurred when she was ~68 years old and she does not recall what was given to assist in her recovery. She did not develop a rash.     Immunization History  Administered Date(s) Administered  . Influenza-Unspecified 10/23/2017    Past Medical History:  Diagnosis Date  . Chronic kidney disease   . Diabetes mellitus without complication ([place])     Tobacco History: Social History   Tobacco Use  Smoking Status Never Smoker  Smokeless Tobacco Never Used   Counseling given: Not Answered   Outpatient Medications Prior to Visit  Medication Sig Dispense Refill  . furosemide (LASIX) 80 MG tablet Take 0.5 tablets (40 mg total) by mouth 2 (two) times daily. 60 tablet 2  . glipiZIDE (GLUCOTROL) 5 MG tablet Take 0.5 tablets (2.5 mg total) by mouth 2 (two) times daily before a meal. 30 tablet 2  . potassium chloride (K-DUR) 10 MEQ tablet Take 1 tablet (10 mEq total) by mouth daily. 60 tablet 0  . acetaminophen (TYLENOL) 325 MG tablet Take 2 tablets (650 mg total) by mouth every 6 (six) hours as needed for mild pain (or Fever >/= 101). (Patient not taking: Reported on 08/24/2018)    . senna-docusate (SENOKOT-S) 8.6-50 MG tablet Take 1 tablet by mouth 2 (two) times daily. (Patient not taking: Reported on 08/24/2018)    . predniSONE (DELTASONE) 20 MG tablet Take 3 tablets (60 mg total) by mouth daily with breakfast. 90 tablet 1   No facility-administered medications prior to visit.    Review of Systems  Review of Systems  Constitutional: Negative.   HENT: Negative.   Respiratory: Positive for cough. Negative for shortness of breath and wheezing.        Rare cough/throat clearing  Cardiovascular: Negative.  Gastrointestinal: Negative for nausea and vomiting.       GERD   Physical Exam  BP 100/60   Pulse 97   Temp 98.5 F (36.9 C) (Oral)   Ht 4\' 8"  (1.422 m)   Wt 179 lb 9.6 oz (81.5 kg)   SpO2 95%   BMI 40.27 kg/m  Physical Exam Constitutional:      General: She is not  in acute distress.    Appearance: Normal appearance. She is obese.  HENT:     Right Ear: Tympanic membrane normal.     Left Ear: Tympanic membrane normal.     Mouth/Throat:     Mouth: Mucous membranes are moist.     Pharynx: Oropharynx is clear.  Cardiovascular:     Rate and Rhythm: Normal rate.     Pulses: Normal pulses.     Heart sounds: Normal heart sounds.  Pulmonary:     Effort: Pulmonary effort is normal.     Breath sounds: Wheezing present.     Comments: Diminished Musculoskeletal: Normal range of motion.  Neurological:     General: No focal deficit present.     Mental Status: She is alert and oriented to person, place, and time. Mental status is at baseline.  Psychiatric:        Mood and Affect: Mood normal.        Behavior: Behavior normal.        Thought Content: Thought content normal.        Judgment: Judgment normal.      Lab Results:  CBC    Component Value Date/Time   WBC 11.7 (H) 07/22/2018 0746   RBC 3.10 (L) 07/22/2018 0746   HGB 8.5 (L) 07/22/2018 0746   HCT 27.0 (L) 07/22/2018 0746   PLT 197 07/22/2018 0746   MCV 87.1 07/22/2018 0746   MCH 27.4 07/22/2018 0746   MCHC 31.5 07/22/2018 0746   RDW 16.7 (H) 07/22/2018 0746   LYMPHSABS 0.5 (L) 07/22/2018 0746   MONOABS 0.3 07/22/2018 0746   EOSABS 0.0 07/22/2018 0746   BASOSABS 0.0 07/22/2018 0746    BMET    Component Value Date/Time   NA 144 07/22/2018 0746   K 3.8 07/22/2018 0746   CL 110 07/22/2018 0746   CO2 23 07/22/2018 0746   GLUCOSE 101 (H) 07/22/2018 0746   BUN 70 (H) 07/22/2018 0746   CREATININE 1.83 (H) 07/22/2018 0746   CALCIUM 8.2 (L) 07/22/2018 0746   GFRNONAA 28 (L) 07/22/2018 0746   GFRAA 32 (L) 07/22/2018 0746    BNP    Component Value Date/Time   BNP 344.1 (H) 07/21/2018 1101    ProBNP No results found for: PROBNP  Imaging: No results found.   Assessment & Plan:   Lung mass - CTA 5/26- LLL density 4.5x2.9cm concerning for mass with surrounding infiltrate -  Bronchoscopy 6/4 of LLL pathology showing atypical squamous cells favored to be reactive. No malignancy.  - CXR today - FU with DR. Alva in 2 months - Needs repeat CT chest without contrast for subcentimeter RUL nodule in 9-12 months   Lobar pneumonia ([place]) - Clinically improved - Denies cough or shortness of breath. No further hemoptysis - Treated with vancomycin and meropenem   Hemoptysis - No further episodes   Heart burn - Epigastric pain/chest tightness when swallowing while she eats - Started 20mg  omeprazole daily last week  - Referred to GI    Martyn Ehrich, NP 08/24/2018

## 2018-08-24 NOTE — Assessment & Plan Note (Signed)
-   Epigastric pain/chest tightness when swallowing while she eats - Started 20mg  omeprazole daily last week  - Referred to GI

## 2018-08-24 NOTE — Assessment & Plan Note (Signed)
No further episodes

## 2018-08-24 NOTE — Assessment & Plan Note (Addendum)
-   CTA 5/26- LLL density 4.5x2.9cm concerning for mass with surrounding infiltrate - Bronchoscopy 6/4 of LLL pathology showing atypical squamous cells favored to be reactive. No malignancy.  - CXR today - FU with DR. Alva in 2 months - Needs repeat CT chest without contrast for subcentimeter RUL nodule in 9-12 months

## 2018-08-25 ENCOUNTER — Other Ambulatory Visit: Payer: Self-pay | Admitting: Primary Care

## 2018-08-25 ENCOUNTER — Other Ambulatory Visit: Payer: Self-pay | Admitting: *Deleted

## 2018-08-25 DIAGNOSIS — R911 Solitary pulmonary nodule: Secondary | ICD-10-CM

## 2018-08-25 DIAGNOSIS — R918 Other nonspecific abnormal finding of lung field: Secondary | ICD-10-CM

## 2018-08-25 NOTE — Progress Notes (Signed)
Labs before CT scan in 1 month

## 2018-08-25 NOTE — Progress Notes (Signed)
CXR showed low lung volumes and cardiomegaly. Please move up CT chest for 1 month

## 2018-08-25 NOTE — Progress Notes (Signed)
Order was placed for patient's CT scan in 1 month. Per [facility] her labs will run out in 2 weeks. Do you want her to have more labs or move CT scan up?     CT changed it was to be w/o contrast. No need for labs.

## 2018-08-26 DIAGNOSIS — R918 Other nonspecific abnormal finding of lung field: Secondary | ICD-10-CM | POA: Diagnosis not present

## 2018-08-26 DIAGNOSIS — N179 Acute kidney failure, unspecified: Secondary | ICD-10-CM | POA: Diagnosis not present

## 2018-08-26 DIAGNOSIS — D631 Anemia in chronic kidney disease: Secondary | ICD-10-CM | POA: Diagnosis not present

## 2018-08-26 DIAGNOSIS — N189 Chronic kidney disease, unspecified: Secondary | ICD-10-CM | POA: Diagnosis not present

## 2018-08-26 NOTE — Progress Notes (Signed)
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  This could explain all her pulmonary findings. Does need repeat CT chest imaging

## 2018-08-26 NOTE — Progress Notes (Signed)
No lab necessary for CT w/o contrast. Nothing further needed.

## 2018-08-27 ENCOUNTER — Other Ambulatory Visit: Payer: Self-pay | Admitting: *Deleted

## 2018-08-27 DIAGNOSIS — R918 Other nonspecific abnormal finding of lung field: Secondary | ICD-10-CM | POA: Diagnosis not present

## 2018-08-27 NOTE — Patient Outreach (Signed)
[facility]) Care Management  [place]  08/27/2018   Julia Johns December 26, 1950 [id number]   Julia Johns, 68 y/o femalecurrently active with [facility] after recent hospitalizationJune 12-18, 2020 for acute renal failure; patient was discharged home to self-care with home health services in place. Unfortunately, patient experienced hospital re-admission 11 days after her previous admission, on June 29- July 22, 2018 for hemoptysis, acute cystitis with hematuria, and hypotension.  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  Subjective:   Successful outreach call to patient's son [name], prior to planning call to patient to make sure [name] would be home so that I will be able to speak with patient because she doesn't answer the phone.  Patient's son discussed recent visit to Lung Doctor, and he discussed patient had an xray to check on lung mass and states that it was low lung volumes  but she has a CT scan scheduled in the next month.  He discussed patient having more energy , walking more in her home using her walker. Diabetes  He discussed patient blood sugar reading are doing much better she is ranging the 100's in the am and 170 in the pm no elevated blood sugar levels in the 300 range in the last week.  He denies her having any low blood sugar symptoms or reading. Discussed patient better food choices and she is more agreeable to taking her medications, even ask him before they are due. Chronic Kidney Disease  He discussed patient still getting, injections at kidney office , and lab work is improving .  Patient weights are staying stable at 172-175 at home, no swelling, reports healing at previous noted skin irritations on her legs stating they look normal.   Successful outreach to patient using [employer] , representative Garnette Scheuermann   [phone]. Patient states that she is doing pretty good, trying to watch what she eats she discussed her recent reading of blood sugars being being 100 to in the 200's.  She then voiced wants is bothering her more now is her constipation, she reports having hemorrhoids  and bleeding when she wipes, she states her "anus is swollen"  . She discussed drinking prune juice , eating prunes and nothing helps. She reports having a bowel movement on today but is was very hard, no complaint of stomach pain. Discussed other measures to help with constipation, discussed foods with fiber beans, fresh vegetables adequate fluid intake helps with relieving constipation , patient states that she has tried all of that.She wants the doctor  to tell her what to do. Patient discussed her other concerns if her knee are bothering her, they are so stiff and sometimes she does not trust them, she discussed using her walker at all times and that she is careful . She discussed ability to move around her home but she wants Dr.Cox to know about her knees.     Encounter Medications:  Outpatient Encounter Medications as of 08/27/2018  Medication Sig Note  . acetaminophen (TYLENOL) 325 MG tablet Take 2 tablets (650 mg total) by mouth every 6 (six) hours as needed for mild pain (or Fever >/= 101). (Patient not taking: Reported on 08/24/2018)   . furosemide (LASIX) 80 MG tablet Take 0.5 tablets (40 mg total) by mouth 2 (two) times daily.   Marland Kitchen glipiZIDE (GLUCOTROL) 5 MG tablet Take 0.5 tablets (2.5 mg total) by mouth 2 (two) times daily  before a meal.   . potassium chloride (K-DUR) 10 MEQ tablet Take 1 tablet (10 mEq total) by mouth daily.   Marland Kitchen senna-docusate (SENOKOT-S) 8.6-50 MG tablet Take 1 tablet by mouth 2 (two) times daily. (Patient not taking: Reported on 08/24/2018) 07/03/2018: Have not started yet   No facility-administered encounter medications on file as of 08/27/2018.     Functional Status:  In your present state of health, do you  have any difficulty performing the following activities: 07/21/2018 07/10/2018  Hearing? N N  Vision? N N  Difficulty concentrating or making decisions? N N  Walking or climbing stairs? Y Y  Comment - uses walker  Dressing or bathing? Y Y  Comment - needs help from family  Doing errands, shopping? Y Y  Comment - family helps  Preparing Food and eating ? - Y  Comment - familly helps  In the past six months, have you accidently leaked urine? - N  Do you have problems with loss of bowel control? - N  Managing your Medications? - Y  Comment - son helps  Managing your Finances? - Y  Comment - family helps  Housekeeping or managing your Housekeeping? - Y  Comment - family helps    Fall/Depression Screening: Fall Risk  07/10/2018  Falls in the past year? 1  Number falls in past yr: 0  Injury with Fall? 0  Risk for fall due to : Impaired balance/gait;History of fall(s)  Follow up Falls prevention discussed   PHQ 2/9 Scores 07/10/2018  PHQ - 2 Score 0    Assessment:   Diabetes- improvement in blood sugar reading and working toward making better meal planning choices, patient son starts the day eating healthy  breakfast with patient as a example to help her.  Chronic Kidney Disease- consistent follow up vasculitis treatment injections  as prescribed by specialist . Weight remaining stable, reinterated with son teachback on signs of sudden weigh gain and he is able to discuss action plan of notifying MD office.  High Fall risk - no falls continues to use her walker, complaining of both knees hurting her.  Social  - good family support at home , making sure patient attends all appointments and helping with her managing diet and daily care routines   - Patient main concerns today at visit are related constipation and her knee discomfort and wanting her doctor to be aware   Discussed foods to include in diet to help with constipation, fiber food source examples reviewed , drinking adequate  water daily,  that help with relieving constipation, over the counter products such as Miralax. Patient discussed needing something else that will work, requesting recommendation from her doctor.   - Patient family concern today is related to patient recent xray results and call report that patient xray showed cardiomegaly and wanted to make sure Dr.Cox was aware. Discussed the term cardiomegaly, enlarged heart, and other test may be done to determine cause , reinforced continuing to monitor for increase in shortness of breath, swelling , increased cough sudden weight gain of 2 pound in a day or 5 in a week to notify MD for follow up.     Plan:  Placed call to Dr.Cox office able to speak with her nurse [name] to discuss patient concerns related to constipation , hemmoroids and noting blood on tissue, her knee discomfort and family concern regarding recent chest xray report. [name] states that she will notify Dr. Tobie Poet of concerns. Patient next office visit is 8/24.  Discussed continuing healthier meal planning, Reviewed continuing to keep a record of blood sugars, and when to notify MD of elevated reading of 240 for more than a day.  Will plan follow up call to patient daughter in law in the next week.   THN CM Care Plan Problem One     Most Recent Value  Care Plan Problem One  High risk for readmission related to recent discharge and less than 30 day readmit for Acute kidney injury   Role Documenting the Problem One  Care Management [place] for Problem One  Active  THN Long Term Goal   Patient will not experience and admission over the next 31 days   THN Long Term Goal Start Date  07/28/18 Barrie Folk date restart due to readmission ]  [facility] Long Term Goal Met Date  08/27/18  [facility] CM Short Term Goal #1   Over the next 24 days patient will attend all medication appointments   THN CM Short Term Goal #1 Start Date  07/28/18 Barrie Folk date restart due to admission ]  [facility] CM Short Term Goal #1  Met Date  08/27/18  [facility] CM Short Term Goal #2   Over the next 30 days patient will continue to weigh daily and keep a record .   THN CM Short Term Goal #2 Start Date  07/28/18  [facility] CM Short Term Goal #2 Met Date  08/27/18    [facility] CM Care Plan Problem Two     Most Recent Value  Care Plan Problem Two  Knowledge deficit related to new diagnosis of Diabetes   Role Documenting the Problem Two  Care Management Coordinator  Care Plan for Problem Two  Active  Interventions for Problem Two Long Term Goal   Reviewed with patient and son currentl clinical state, commended patient on how she is taking her medications. Call to PCP office regarding patient concern related to hemorhoid swelling, bleeding noted and knee pain. Discussed nutrtion measures to relieve constipation , food with fiber, adequate fluid water intake.    THN Long Term Goal  Over the next 90 days patient will be able to identify at 2  least measures of self care managment of Diabetes    THN Long Term Goal Start Date  07/10/18  [facility] CM Short Term Goal #1   Over the next 30 days patient/son will be able to reports monitioring blood sugar 2 times daily and keeping a record.   Geralyn Flash per MD goals ]  [facility] CM Short Term Goal #1 Start Date  07/28/18  [facility] CM Short Term Goal #1 Met Date   08/27/18  THN CM Short Term Goal #2   Patient/son will be able to verbalize 2 symptoms of low blood sugar over the next 30 days   THN CM Short Term Goal #2 Start Date  07/28/18  [facility] CM Short Term Goal #2 Met Date  08/19/18  THN CM Short Term Goal #3   Over the next 30 days patient will be able to report meal planning using appropriate foods    THN CM Short Term Goal #3 Start Date  08/04/18  Interventions for Short Term Goal #3  Reviewed diabetes meal planning different food groups and foods in each , review of foods with fiber such as beans have more fiber and helps control blood sugar, and carbohydrates such as rice corn impact blood sugar more and important to  watch portions sizes .       Joelene Millin  Bertell Maria RN, [place] Management Coordinator  [phone]- Mobile [phone]- Toll Free Main Office

## 2018-08-28 DIAGNOSIS — K640 First degree hemorrhoids: Secondary | ICD-10-CM | POA: Diagnosis not present

## 2018-08-28 DIAGNOSIS — L89212 Pressure ulcer of right hip, stage 2: Secondary | ICD-10-CM | POA: Diagnosis not present

## 2018-08-28 DIAGNOSIS — M25569 Pain in unspecified knee: Secondary | ICD-10-CM | POA: Diagnosis not present

## 2018-08-28 DIAGNOSIS — K5901 Slow transit constipation: Secondary | ICD-10-CM | POA: Diagnosis not present

## 2018-08-31 DIAGNOSIS — I776 Arteritis, unspecified: Secondary | ICD-10-CM | POA: Diagnosis not present

## 2018-08-31 DIAGNOSIS — J9621 Acute and chronic respiratory failure with hypoxia: Secondary | ICD-10-CM | POA: Diagnosis not present

## 2018-08-31 DIAGNOSIS — L89213 Pressure ulcer of right hip, stage 3: Secondary | ICD-10-CM | POA: Diagnosis not present

## 2018-08-31 DIAGNOSIS — L89221 Pressure ulcer of left hip, stage 1: Secondary | ICD-10-CM | POA: Diagnosis not present

## 2018-09-02 ENCOUNTER — Other Ambulatory Visit: Payer: Self-pay | Admitting: *Deleted

## 2018-09-02 DIAGNOSIS — D631 Anemia in chronic kidney disease: Secondary | ICD-10-CM | POA: Diagnosis not present

## 2018-09-02 DIAGNOSIS — N189 Chronic kidney disease, unspecified: Secondary | ICD-10-CM | POA: Diagnosis not present

## 2018-09-02 DIAGNOSIS — N179 Acute kidney failure, unspecified: Secondary | ICD-10-CM | POA: Diagnosis not present

## 2018-09-02 NOTE — Patient Outreach (Signed)
[facility]) Care Management  09/02/2018  Julia Johns 02/16/1950 [id number]    Telephone assessment    [name], 68 y/o femalecurrently active with [place] after recent hospitalizationJune 12-18, 2020 for acute renal failure; patient was discharged home to self-care with home health services in place. Unfortunately, patient experienced hospital re-admission 11 days after her previous admission, on June 29- July 22, 2018 for hemoptysis, acute cystitis with hematuria, and hypotension.  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   Subjective  Outreach call to patient daughter in law [name],  as agreed to follow up on patient concerns from call on last week, HIPAA verified x 2 identifiers  [name] requested return call a little later today.   1100 Successful follow up call to patient caregiver, [name], she discussed patient feeling a little better but has low energy,discussed anemia and how it may contribute to her low energy and reassured her MD or keeping check on her levels. . She dicussed patient continues to receive weekly injections for treatment of vasculitis, until her number is at 58, she is unsure what that number is . Discussed with caregiver injections related to low hemoglobin as result of kidney condition not making enough RBC and injections help with improving this, she discussed having a better understanding, she recalls recent reading of 9.7 on last week, Patient is now at appointment to receive injection.   She further discussed  Constipation/hemorrhoids  She reports visit to MD on last week for evaluation and patient has a new prescription of hemorrhoid cream . She has also been prescribed Miralax to take daily for constipation that will help with ease of have bowel movement with hemorrhoid condition, reinforced to encourage patient to  continue daily use to be effective for now. She report patient is having some improvement with constipation but needs encouragement , reviewed how to use miralax and liquids it can be mixed in.   She also discussed patient having a "sore" at her inner thigh that she was prescribed a cream for ( noted mupriocin in med list in [facility]), patient continued to decline home health RN services.  She reports patient having a cream and continues to apply. Discussed role of blood sugar control to help with healing. She states patient is working on eating better. She is unable to review blood sugar readings at this time.   Patient knee pain Patient to follow up with MD in one month for possible injections if no improvement , patient has been recommended by MD  to take tylenol as needed  for pain, caregiver states that patient sometime hesitant with taking medications. Reinforced fall prevention encouraged to use her walker.     Appointment  She follows up with Dr.Cox in September Dr.Patel on 8/24.    Plan Will plan follow up call to patient in the next month Reinforced with caregiver, patient take  Miralax regularly to improve constipation, and notify MD of concerns related to skin condition on thigh not healing , signs of infection.      THN CM Care Plan Problem One     Most Recent Value  Care Plan Problem One  High risk for readmission related to recent discharge and less than 30 day readmit for Acute kidney injury   Role Documenting the Problem One  Care Management [place] for Problem One  Active  [facility] Long Term Goal   Patient  will not experience and admission over the next 31 days   THN Long Term Goal Start Date  07/28/18 Barrie Folk date restart due to readmission ]  [facility] Long Term Goal Met Date  08/27/18  [facility] CM Short Term Goal #1   Over the next 24 days patient will attend all medication appointments   THN CM Short Term Goal #1 Start Date  07/28/18 Barrie Folk date restart due to admission ]   [facility] CM Short Term Goal #1 Met Date  08/27/18  [facility] CM Short Term Goal #2   Over the next 30 days patient will continue to weigh daily and keep a record .   THN CM Short Term Goal #2 Start Date  07/28/18  [facility] CM Short Term Goal #2 Met Date  08/27/18    [facility] CM Care Plan Problem Two     Most Recent Value  Care Plan Problem Two  Knowledge deficit related to new diagnosis of Diabetes   Role Documenting the Problem Two  Care Management Coordinator  Care Plan for Problem Two  Active  Interventions for Problem Two Long Term Goal   Discussed patient current clinical state, reinforced taking medication and treatment as MD prescribed.,   THN Long Term Goal  Over the next 90 days patient will be able to identify at 2  least measures of self care managment of Diabetes    THN Long Term Goal Start Date  07/10/18  THN CM Short Term Goal #1   Over the next 30 days patient/son will be able to reports monitioring blood sugar 2 times daily and keeping a record.   Geralyn Flash per MD goals ]  [facility] CM Short Term Goal #1 Start Date  07/28/18  THN CM Short Term Goal #1 Met Date   08/27/18  THN CM Short Term Goal #2   Patient/son will be able to verbalize 2 symptoms of low blood sugar over the next 30 days   THN CM Short Term Goal #2 Start Date  07/28/18  [facility] CM Short Term Goal #2 Met Date  08/19/18  THN CM Short Term Goal #3   Over the next 30 days patient will be able to report meal planning using appropriate foods    THN CM Short Term Goal #3 Start Date  08/04/18  Interventions for Short Term Goal #3  Reinforced balanced meal planning, including protein in meals to help promote healing , reviewed examples   THN CM Short Term Goal #4  Patient/caregiver will be able to report wound healing at thigh site over the next 30 days as evidenced by report  THN CM Short Term Goal #4 Start Date  09/02/18  Interventions of Short Term Goal #4  Advised caregiver to encourage patient to adhere to MD prescribed treatment for wound ,  discussed keeping area clean and dry, reviewed signs of infecton , increased redness, drainage. will plan call to patient the next week .         [name], RN, [place] Management Coordinator  [phone]- Mobile [phone]- Toll Free Main Office

## 2018-09-09 DIAGNOSIS — N189 Chronic kidney disease, unspecified: Secondary | ICD-10-CM | POA: Diagnosis not present

## 2018-09-09 DIAGNOSIS — N179 Acute kidney failure, unspecified: Secondary | ICD-10-CM | POA: Diagnosis not present

## 2018-09-09 DIAGNOSIS — D631 Anemia in chronic kidney disease: Secondary | ICD-10-CM | POA: Diagnosis not present

## 2018-09-10 ENCOUNTER — Other Ambulatory Visit: Payer: Self-pay | Admitting: *Deleted

## 2018-09-10 NOTE — Patient Outreach (Signed)
[facility]) Care Management  09/10/2018  Jeanae Whitmill 1950-10-29 [id number]   Telephone follow up call    [name], 68 y/o femalecurrently active with [place] after recent hospitalizationJune 12-18, 2020 for acute renal failure; patient was discharged home to self-care with home health services in place.  Patient  experienced hospital re-admission 11 days after her previous admission, on June 29- July 22, 2018 for hemoptysis, acute cystitis with hematuria, and hypotension.  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   Subjective  Telephone follow up call attempt to reach patient using [employer] [phone] ) with call to patient exDaughter in law number as agreed to by patient. No answer interpreter able to leave as requested HIPAA compliant  voice mail message for return call .   1420 Successful return call to patient using [employer], Tonny Bollman [phone]) Patient initially states that she is doing fine, healing.   She further discussed   Diabetes  She reports reading doing better ,morning ranges 120- 180 and 200 in the evening, denies having reading in the 300 range. She reports continuing to take medication as prescribed, her son helping her. Discussed with patient balanced meal planning including proteins, she reports having a good appetite and eating 3 meals a day, and a snack. She discussed her love of rice, discussed rice can be included in her diet with moderation and control on portions and spread throughout the day.  Patient discussed improvement of sore area on lower leg, reports using cream prescribed and it is doing better. Will benefit from ongoing  education and support.  Anemia/Chronic Kidney Disease  Patient discussed continuing to keep weekly injections and states that she understanding lab work is doing  better. Explained to patient reason for injection to treat low blood count due to recent kidney condition, and results have improved. Patient states she is hopeful that she will only have 2 more treatments and she will find out more at next visit with specialist.  Patients continues to weigh daily, weight range is 180 to 184, no sudden daily weight increases, she reports legs are not swollen. Improved with swelling, hemoglobin up to 11.2 , receiving Retacrit injections.   Lung Mass Patient discussed having follow up lung scan in next month, she discussed having occasional cough, nonproductive occasional chest tightness only when she coughs, denies shortness of breath .  She denies having difficulty swallowing at visit today.  Constipation  Patient reports improvement with constipation and hemorrhoids, taking Miralax about every other day and having bowel movements every other day she reports normal for her . Discussed with patient daily use of miralax helps with treating constipation, making it easier to have a bowel movement with less strain on hemmorhoids.   Appointments Dr.Patel 8/24 Lung Scan 9/2  Plan  Will continue education and support on chronic medical conditions,  Reinforced with patient notifying MD of worsening of symptoms,increasing swelling sudden weight gain, shortness of breath chest pain. Will plan return call in the next month to patient son.    THN CM Care Plan Problem One     Most Recent Value  Care Plan Problem One  High risk for readmission related to recent discharge and less than 30 day readmit for Acute kidney injury   Role Documenting the Problem One  Care Management [place] for Problem One  Active  [facility] Long Term Goal  Patient will not experience and admission over the next 31 days   THN Long Term Goal Start Date  07/28/18 Barrie Folk date restart due to readmission ]  [facility] Long Term Goal Met Date  08/27/18  [facility] CM Short Term Goal #1   Over the next 24 days  patient will attend all medication appointments   THN CM Short Term Goal #1 Start Date  07/28/18 Barrie Folk date restart due to admission ]  [facility] CM Short Term Goal #1 Met Date  08/27/18  [facility] CM Short Term Goal #2   Over the next 30 days patient will continue to weigh daily and keep a record .   THN CM Short Term Goal #2 Start Date  07/28/18  [facility] CM Short Term Goal #2 Met Date  08/27/18    [facility] CM Care Plan Problem Two     Most Recent Value  Care Plan Problem Two  Knowledge deficit related to new diagnosis of Diabetes   Role Documenting the Problem Two  Care Management Coordinator  Care Plan for Problem Two  Active  Interventions for Problem Two Long Term Goal   Reinforced with patient continuing to monitor blood sugar, discussed how this can help with identify how it varys with eating certain food and portions sizes . i  THN Long Term Goal  Over the next 90 days patient will be able to identify at 2  least measures of self care managment of Diabetes    THN Long Term Goal Start Date  07/10/18  THN CM Short Term Goal #1   Over the next 30 days patient/son will be able to reports monitioring blood sugar 2 times daily and keeping a record.   Geralyn Flash per MD goals ]  [facility] CM Short Term Goal #1 Start Date  07/28/18  THN CM Short Term Goal #1 Met Date   08/27/18  THN CM Short Term Goal #2   Patient/son will be able to verbalize 2 symptoms of low blood sugar over the next 30 days   THN CM Short Term Goal #2 Start Date  07/28/18  [facility] CM Short Term Goal #2 Met Date  08/19/18  THN CM Short Term Goal #3   Over the next 30 days patient will be able to report meal planning using balanced meal plan .   [go]  THN CM Short Term Goal #3 Start Date  09/10/18 [goal date extended for continued reinforcement ]  Interventions for Short Term Goal #3  Discussed protein examples to include in meals beans, spreading rice portions throught out the day to help with balancing meals , avoid eating in large portions and at each meal   THN CM Short Term Goal #4  Patient/caregiver will be able to report wound healing at thigh site over the next 30 days as evidenced by report  THN CM Short Term Goal #4 Start Date  09/02/18  Interventions of Short Term Goal #4  Reviewed with patient importance of keeping sight clean and dry, reviewed sign of worsening redness, drainage ,       [name], RN, [place] Management Coordinator  [phone]- Mobile [phone]- [place]

## 2018-09-14 DIAGNOSIS — D631 Anemia in chronic kidney disease: Secondary | ICD-10-CM | POA: Diagnosis not present

## 2018-09-14 DIAGNOSIS — I776 Arteritis, unspecified: Secondary | ICD-10-CM | POA: Diagnosis not present

## 2018-09-14 DIAGNOSIS — N189 Chronic kidney disease, unspecified: Secondary | ICD-10-CM | POA: Diagnosis not present

## 2018-09-14 DIAGNOSIS — E1122 Type 2 diabetes mellitus with diabetic chronic kidney disease: Secondary | ICD-10-CM | POA: Diagnosis not present

## 2018-09-16 DIAGNOSIS — R05 Cough: Secondary | ICD-10-CM | POA: Diagnosis not present

## 2018-09-16 DIAGNOSIS — N189 Chronic kidney disease, unspecified: Secondary | ICD-10-CM | POA: Diagnosis not present

## 2018-09-16 DIAGNOSIS — D631 Anemia in chronic kidney disease: Secondary | ICD-10-CM | POA: Diagnosis not present

## 2018-09-16 DIAGNOSIS — R509 Fever, unspecified: Secondary | ICD-10-CM | POA: Diagnosis not present

## 2018-09-16 DIAGNOSIS — N179 Acute kidney failure, unspecified: Secondary | ICD-10-CM | POA: Diagnosis not present

## 2018-09-23 ENCOUNTER — Other Ambulatory Visit: Payer: Self-pay

## 2018-09-23 ENCOUNTER — Ambulatory Visit (HOSPITAL_BASED_OUTPATIENT_CLINIC_OR_DEPARTMENT_OTHER)
Admission: RE | Admit: 2018-09-23 | Discharge: 2018-09-23 | Disposition: A | Discharge status: Home / Self Care | Payer: Medicare HMO | Source: Ambulatory Visit | Attending: Primary Care | Admitting: Primary Care

## 2018-09-23 DIAGNOSIS — D631 Anemia in chronic kidney disease: Secondary | ICD-10-CM | POA: Diagnosis not present

## 2018-09-23 DIAGNOSIS — N189 Chronic kidney disease, unspecified: Secondary | ICD-10-CM | POA: Diagnosis not present

## 2018-09-23 DIAGNOSIS — N179 Acute kidney failure, unspecified: Secondary | ICD-10-CM | POA: Diagnosis not present

## 2018-09-23 DIAGNOSIS — R911 Solitary pulmonary nodule: Secondary | ICD-10-CM | POA: Insufficient documentation

## 2018-09-23 DIAGNOSIS — R918 Other nonspecific abnormal finding of lung field: Secondary | ICD-10-CM | POA: Insufficient documentation

## 2018-09-24 ENCOUNTER — Telehealth: Payer: Self-pay | Admitting: Pulmonary Disease

## 2018-09-24 ENCOUNTER — Other Ambulatory Visit: Payer: Self-pay | Admitting: *Deleted

## 2018-09-24 NOTE — Patient Outreach (Signed)
Julia Johns  09/24/2018  Julia Johns 1950-08-22 [id number]  Telephone assessment  Care Coordination  Julia Johns, 68 y/o femalecurrently active with [place] after recent hospitalizationJune 12-18, 2020 for acute renal failure;vasculitis  patient was discharged home to self-care with home health services in place.  Patient  experienced hospital re-admission  on June 29- July 22, 2018 for hemoptysis, acute cystitis with hematuria, and hypotension.  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  Incoming call from patient daughter in law Julia Johns,  She discussed patient having lung scan done on yesterday and concern regarding patient does not answer her phone and she wants to make sure she or patient son is able to get results of test.  She also discussed patient oxygen saturation being low at visit to Julia Johns outpatient during Reacrit injections and it was recommended that family purchase oxygen saturation monitor and patient wear her oxygen, in which they did at 2 liters.  Julia Johns discussed patient reading had been running 84- 88 % on 2 liters and patient coughing a little more today. she called JuliaCox office this am and has been advised to increase oxygen flow to 3 liters. She was also advised to call Pulmonary MD office  Julia Johns reports oxygen device, portable system that she has only goes to 2.5 liters and she is unsure of having bigger concentrator only  emergency tank that patient has been placed on. Report needing help with knowing what to do about oxygen.  Placed 3 way call to Julia Johns regarding concern for oxygen equipment and need to increase liter flow. Representative able to speak with Julia Johns in home regarding oxygen equipment in place , he recommends patient needs new  order from MD office with increased oxygen flow, in  order to get appropriate equipment in the home. Patient has 2 emergency tanks at present and Johns portable device that only goes to 2.5 liters, daughter  describes not having concentrator in home, patient  is currently on oxygen at 3 liters via tanks. Julia Johns reports patient with less coughing while on 3 liters and oxygen levels at 88-92%. She denies patient complaint of shortness of breath or struggling to breath, reports some discomfort at mid chest when coughing that she has been experiencing .Discussed normal oxygen saturation level, and keeping level at least 90.  Julia Johns reports awaiting return call from Pulmonary office.  Discussed with Julia Johns if patient had any increase in swelling , she reports no increase in swelling , discussed patient recent weights , she is unsure and unable to locate patient logs. She did discuss patient has been weaned off prednisone and lasix was stopped at recent appointment with JuliaPatel on 8/24.  1018 Placed call to JuliaCox, office able to speak with Julia Johns , explained situation with oxygen and need for new order for 3 liter flow. I provided contact information to [facility] to RN for new orders . She inquired if daughter  had contacted JuliaAlva office, informed her  that Julia Johns had placed call and awaiting return call. Discussed patient currently on 3 liters with oxygen saturation at 88- to over 94%, patient without increase in shortness , has nonproductive cough.  1530 Return call to Julia Johns patient son, he understands patient has an appointment with JuliaAlva on tomorrow, he will be attending visit with patient. He discussed that he started back giving patient lasix on yesterday and she received it  on today as well. He reports not keeping track of patient weights in the last week, discussed importance as a measure to help identify sudden fluid weight gain, he voiced understanding.  Asked Julia Johns if they had received contact from [facility] regarding delivery  of oxygen he reports no calls yet , He discussed patient being on a portable tank and that she has a portable oxygen  that will go to 2.5 liters,described concentrator with homefill compressor he  reports  patient does not have that equipment in home.   [place] call to [facility] to follow up on receiving new oxygen orders for 3 liters representative states she is able to see note  that MD office will email order  but not that email has been received . Representative is agreeable to contacting patient son to verify what oxygen supplies that currently have in the home, provided Julia Johns contact number.  1600 Return call to [place] at JuliaCox office she verifies office staff sent email to [facility] with new orders for 3 liters.   Plan Reinforced with caregiver notifying MD for new concerns, 911 for worsening symptoms of shortness of breath .  Reinforced wearing oxygen as supplied in home .   Julia Johns     Most Recent Value  Care Plan Problem Johns  High risk for readmission related to Kidney and lung health conditons   Role Documenting the Problem Johns  Care Johns Coordinator  Care Plan for Problem Johns  Active  Julia Long Term Goal   Patient will not experience and admission over the next 31 days   Julia Long Term Goal Start Date  09/24/18 Julia Johns plan reinitiated ]  Interventions for Problem Johns Long Term Goal  Discussed patient current clinical state, advised notifying MD  for worsening of shortness of breath, cough , fever symptoms and 911 for emergency    [facility] CM Short Term Goal #1   Over the next 30 days patient/family will be able to identify at least 2 worsening symptoms of condiition .  Julia CM Short Term Goal #1 Start Date  09/24/18  Interventions for Short Term Goal #1  Reveiwed with caregiver worsening symptoms to notify MD of , increased shortness of breath, weight gain, cough with hemoptysis reviewed action plan of importance of seeking prompt medical  attention.   Julia CM Short Term Goal #2   Over the next 30 days patient will continue to weigh daily and keep a record .   Julia CM Short Term Goal #2 Start Date  09/24/18  Interventions for Short Term Goal #2  Advised regarding importance of keeping track of weights, best time of day to weigh and sudden weight gain of 3 pounds in a day or 5 in a week is information to be communicated with MD, for adjustments in medical plan     [facility] CM Care Plan Problem Two     Most Recent Value  Care Plan Problem Two  Knowledge deficit related to new diagnosis of Diabetes   Role Documenting the Problem Two  Care Johns Coordinator  Care Plan for Problem Two  Active  Interventions for Problem Two Long Term Goal   Discussed current clinical state encouraged to continue to montior blood sugar reading  and notify MD of new concerns., denies low readings.   Julia Long Term Goal  Over the next 90 days patient will be able to identify at 2  least measures of self  care managment of Diabetes    Julia Long Term Goal Start Date  07/10/18  Julia CM Short Term Goal #1   Over the next 30 days patient/son will be able to reports monitioring blood sugar 2 times daily and keeping a record.    Julia CM Short Term Goal #1 Start Date  07/28/18  [facility] CM Short Term Goal #1 Met Date   08/27/18  Julia CM Short Term Goal #2   Patient/son will be able to verbalize 2 symptoms of low blood sugar over the next 30 days   Julia CM Short Term Goal #2 Start Date  07/28/18  [facility] CM Short Term Goal #2 Met Date  08/19/18  Julia CM Short Term Goal #3   Over the next 30 days patient will be able to report meal planning using balanced meal plan .    Julia CM Short Term Goal #3 Start Date  09/10/18  [facility] CM Short Term Goal #4  Patient/caregiver will be able to report wound healing at thigh site over the next 30 days as evidenced by report  [facility] CM Short Term Goal #4 Start Date  09/02/18       [name], RN, [place]  Coordinator  [phone]- Mobile [phone]- [place] Office

## 2018-09-24 NOTE — Telephone Encounter (Signed)
09/24/2018 1215  Please contact the patient.  I spoke with Dr. [name] and he has reviewed the CT.  Concerned that patient may be having worsened fluid overload.  We can have the patient come into our office to be further evaluated if COVID negative for questions such as symptoms.  Aware that she is having worsening shortness of breath based off of initial call.  Also need to check with patient and see which nephrologist is managing her.  Is it still Dr. Warrick Parisian to somebody else now that I believe he has retired.  If so we will need to contact  [name] and make sure that they have the CT images.  Is patient still receiving her [name]?   I would also recommend the patient follow-up with her nephrology team for a close follow-up as they may need to augment her treatment therapies.  Wyn Quaker, FNP

## 2018-09-24 NOTE — Telephone Encounter (Signed)
I have reached out to Dr. [name].  When I hear back from him then we can work on following up with the patient.  If able you can let the patient know that we are touching base with Dr. [name] and then will be in touch with the patient.  Wyn Quaker, FNP

## 2018-09-24 NOTE — Telephone Encounter (Signed)
Returned call to Julia Johns to schedule appt. Dr. [name] is not available - appt scheduled 0900 on 09/25/18 with [name], NP.  Son will accompany patient to visit for interpretation.  He will address dpr updates at this visit also.  Both son and patient have had no recent travel, no new symptoms of illness other than the SOB and dry cough experienced by patient.  No pending covid test results and no deny any reason to believe they have been exposed to the coronavirus.  Julia Johns (daughter-in-law) states she herself has cancer and they are very cautious and seldom go out of the home.  Also advised the patient would still need to follow up with [name] also.  Family did not know the name of the physician at [name].  Patient is no longer on [name] but may restart it in about 6 months.   Contacted [name] but were unable to get through to speak with someone.  Tried multiple extensions, including the operator, and could only leave voicemail.  Left a voicemail on the physician line regarding this patient and brief message about recent SOB and CT and that patient was advised to follow up with them also.  Also mentioned patient would be seen in this office tomorrow morning and left our number for call back.    Nothing further needed at this time.

## 2018-09-24 NOTE — Progress Notes (Signed)
See additional triage messages regarding this.  Overall patient CT chest has been reviewed by Dr. [name].  Improved left lower lobe opacity.  This is good news.  Looks like patient could be potentially fluid overloaded based off the edema and what we are seeing on patient's current CT.  The CT images as well as imaging report needs to be sent to patient's nephrologist.  Is the patient more fluid overloaded than usual?  Is the patient still taking [name]?  Wyn Quaker, FNP

## 2018-09-24 NOTE — Telephone Encounter (Signed)
Obviously pt will need interpreter scheduled to be with pt or family will need to translate if brought into office visit.   Wyn Quaker FNP

## 2018-09-24 NOTE — Telephone Encounter (Signed)
Spoke with pt, Dr. Tobie Poet was called because her oxygen levels were decreasing. Wednesday when she went to get her injection, her oxygen was low and was placed on oxygen. They called Dr. Tobie Poet and he told her to increase to 3L since her oxygen was in the 80's. He also  advised her to call Dr. [name] to see what she should do about her oxygen. She had a CT yesterday and her daughter in law requested the results.   In reviewing her DPR, her son or her daughter in law are not listed so we are unable to give them any health information on the patient. The patient only speaks Spanish so we would have to call back with a Spanish interpreter.  Dr. [name] is not here and Eustaquio Maize is not here to advise.  Can the provider of the day please advise as soon as possible.   Her current oxygen level 92% on 3L

## 2018-09-24 NOTE — Progress Notes (Addendum)
_0  ID: Julia Johns, female    DOB: April 06, 1950, 68 y.o.   MRN: [id number]  Chief Complaint  Patient presents with   Follow-up    Per son, increased SOB that started last week.     Referring provider: [name], MD  HPI:  68 year old female never smoker followed in our office for abnormal CT showing lung mass in May/2020.  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  She is managed by Dr. Posey Pronto at [name].  She received 4 weeks of [name] and now is on a every 37-monthschedule.  Next due [name] infusion in January 2021.  PMH: ANCA vasculitis, chronic kidney disease stage IV, history of hemoptysis, GERD, obesity Smoker/ Smoking History: Never smoker Maintenance: Receives [name] every 6 months-due next in January/2021 Pt of: Dr. AElsworth Soho 09/25/2018  - Visit   68year old female never smoker presenting to our office today after contacting our office earlier this week and having worsening shortness of breath.  Patient has been hospitalized multiple times over the past couple months for management of her breathing as well as abnormal CT imaging.  [facility] PPosey Prontofor recently diagnosed ANCA vasculitis that likely led to worsening kidney function chronic kidney disease stage IV.  She received weekly [name] for 4 weeks and now is on a 668-monthituxan infusion schedule.  Her most recent [name] infusion was on 07/29/2018.  Son who is with patient today reports that her next [name] infusion should be received in January/2021.  Their next follow-up with nephrology is in 2 to 3 months.  [facility] to help improve patient's known anemia.   [facility] and oxygen levels on room air were shown to be  77%.  They contacted patient's primary care provider and patient's primary care provider recommended patient be placed on 3 L of O2.  Patient then contacted our office prompting the scheduled office visit.  Patient also recently completed a follow-up CT chest.  Those results are listed below:  09/23/2018-CT chest without contrast-intralobular septal thickening in the upper lungs bilaterally with bilateral diffuse groundglass attenuation demonstrating slight sparing of lower lungs, imaging features nonspecific and edema infection inflammatory disease and hemorrhage all can have this appearance, focal area of consolidative opacity in the left lower lobe improved when compared to 06/16/2018, mosaic attenuation in the lungs bilaterally, nonspecific but likely reflecting air trapping, prominence of main pulmonary arteries suggest pulmonary arterial hypertension  Patient son is primary caregiver of patient also reports that after last nephrology office visit they were informed that they could decrease patient's 80 mg of Lasix daily as they saw fit based off of patient's fluid levels and weight.  As patient did not have lower extremity swelling patient was titrated off of Lasix.  She has not taken any Lasix over the last week.  2 days ago when symptoms were noticed the patient was hypoxemic patient's son placed patient back on 40 mg of Lasix daily.   Chart review:  09/14/2018- [facility]-Dr. PaPosey Pronto>>Change furosemide 80 mg 1 tablet daily  Tests:   06/16/2018-CTA chest- no PE, fairly small right pleural effusions, slightly larger than recent studies, multifocal airspace consolidation progressed suspect consolidation surrounding mass in left lower lobe posteriorly, subcentimeter lymph nodes, enlarged left lobe of  thyroid with dominant thyroid mass measuring 2.8 x 2.7 cm and left lobe, may be prudent to correlate with ultrasound given dominant thyroid mass on left, the paraspinous lesion from T6-T10  noted on recent studies is better seen on recent thoracic MRI and CT chest not bolus time to optimize arterial vascular visualization, neoplastic etiology of this.  Spinous lesion may be of concern as noted recent thoracic MR, mild reflux contrast tip into the inferior vena cava and hepatic veins may indicate increase in right heart pressure  5/27 AFB > neg 5/27 expectorated sputum > few candida albicans.  5/28 BAL > negative 5/28 Fungus culture >>> 5/28 AFB BAL > negative  Bronchoscopy 6/4 of LLL pathology showing atypical squamous cells favored to be reactive. No malignancy.   09/23/2018-CT chest without contrast-intralobular septal thickening in the upper lungs bilaterally with bilateral diffuse groundglass attenuation demonstrating slight sparing of lower lungs, imaging features nonspecific and edema infection inflammatory disease and hemorrhage all can have this appearance, focal area of consolidative opacity in the left lower lobe improved when compared to 06/16/2018, mosaic attenuation in the lungs bilaterally, nonspecific but likely reflecting air trapping, prominence of main pulmonary arteries suggest pulmonary arterial hypertension  06/16/2018-echocardiogram-LV ejection fraction 60 to 65%, right ventricle is normal normal systolic function, estimated pressure of 26.4  06/24/2018-swallow study- mild aspiration risk, no treatment recommendations at this time, regular solids, thin liquids, home meds with liquid  06/15/2018-connective tissue work-up Cytoplasmic C ANCA-1: 80 P ANCA- less than 1: 20 Atypical p-ANCA titer less than 1: 20 C-reactive protein-38.1 Sed rate-124  07/05/2018- connective tissue work-up ANCA proteinase- 23.2 ANA negative Anti-nuclear antibody negative ASO-124 Double-stranded DNA antibody-negative GNB AB-3 MPO antibody-less than 9, negative  SIX MIN WALK 09/25/2018  Supplimental Oxygen during Test? (L/min) Yes  O2 Flow Rate 3  Type Continuous  Tech Comments:  Patient completed a half lap on 3L before O2 dropped to 85%. O2 was increased to 4L, O2 recovered to 93%. Patient felt SOB and weak after walk.    FENO:  No results found for: NITRICOXIDE  PFT: No flowsheet data found.  Imaging: Ct Chest Wo Contrast  Result Date: 09/23/2018 CLINICAL DATA:  Decreased O2 sats with shortness of breath and cough. EXAM: CT CHEST WITHOUT CONTRAST TECHNIQUE: Multidetector CT imaging of the chest was performed following the standard protocol without IV contrast. COMPARISON:  06/16/2018 FINDINGS: Cardiovascular: Heart is mildly enlarged. No substantial pericardial effusion. No thoracic aortic aneurysm. Enlargement of the main pulmonary arteries suggests pulmonary arterial hypertension. Mediastinum/Nodes: Stable asymmetric elevation left thyroid lobe, potentially related to a 2.2 cm left thyroid nodule. 8 mm short axis left paratracheal node is similar to prior. 8 mm short axis prevascular lymph node was 6 mm previously. No evidence for gross hilar lymphadenopathy although assessment is limited by the lack of intravenous contrast on today's study. The esophagus has normal imaging features. There is no axillary lymphadenopathy. Lungs/Pleura: Interlobular septal thickening noted in the upper lungs bilaterally. There is diffuse ground-glass attenuation bilaterally with an upper lung predominance. Areas of mosaic attenuation in the mid and lower lungs are nonspecific but may reflect air trapping. Wedge-shaped area of consolidative opacity noted posterior left lower lobe. Upper Abdomen: The liver shows diffusely decreased attenuation suggesting fat deposition. Musculoskeletal: No worrisome lytic or sclerotic osseous abnormality. IMPRESSION: 1. Interlobular septal thickening in the upper lungs bilaterally with bilateral diffuse ground-glass attenuation demonstrating slight sparing of the lower lungs. Imaging features nonspecific and edema, infection, inflammatory disease and hemorrhage  could  all have this appearance. 2. Focal area of consolidative opacity in the left lower lobe, improved when comparing to 06/16/2018. 3. Mosaic attenuation in the lungs bilaterally, nonspecific but likely reflecting air trapping. 4. Upper normal mediastinal lymph nodes, presumably reactive. 5. Prominence of the main pulmonary arteries suggests pulmonary arterial hypertension. Electronically Signed   By: Misty Stanley M.D.   On: 09/23/2018 20:13      Specialty Problems      Pulmonary Problems   Lung mass    06/16/2018-CTA chest- no PE, fairly small right pleural effusions, slightly larger than recent studies, multifocal airspace consolidation progressed suspect consolidation surrounding mass in left lower lobe posteriorly, subcentimeter lymph nodes, enlarged left lobe of thyroid with dominant thyroid mass measuring 2.8 x 2.7 cm and left lobe, may be prudent to correlate with ultrasound given dominant thyroid mass on left, the paraspinous lesion from T6-T10 noted on recent studies is better seen on recent thoracic MRI and CT chest not bolus time to optimize arterial vascular visualization, neoplastic etiology of this.  Spinous lesion may be of concern as noted recent thoracic MR, mild reflux contrast tip into the inferior vena cava and hepatic veins may indicate increase in right heart pressure  5/27 AFB > neg 5/27 expectorated sputum > few candida albicans.  5/28 BAL > negative 5/28 Fungus culture >>> 5/28 AFB BAL > negative  Bronchoscopy 6/4 of LLL pathology showing atypical squamous cells favored to be reactive. No malignancy.   09/23/2018-CT chest without contrast-intralobular septal thickening in the upper lungs bilaterally with bilateral diffuse groundglass attenuation demonstrating slight sparing of lower lungs, imaging features nonspecific and edema infection inflammatory disease and hemorrhage all can have this appearance, focal area of consolidative opacity in the left lower lobe improved  when compared to 06/16/2018, mosaic attenuation in the lungs bilaterally, nonspecific but likely reflecting air trapping, prominence of main pulmonary arteries suggest pulmonary arterial hypertension      Lobar pneumonia (HCC)   Acute respiratory failure with hypoxia (HCC)   Hemoptysis   Shortness of breath      Allergies  Allergen Reactions   Penicillins Anaphylaxis    Patient stated that within a few minutes of receiving a penicillin IM injection she rapidly became unconscious and required "additional" medication to recover. This occurred when she was ~68 years old and she does not recall what was given to assist in her recovery. She did not develop a rash.     Immunization History  Administered Date(s) Administered   Influenza-Unspecified 10/23/2017    Past Medical History:  Diagnosis Date   Chronic kidney disease    Diabetes mellitus without complication ([place])     Tobacco History: Social History   Tobacco Use  Smoking Status Never Smoker  Smokeless Tobacco Never Used   Counseling given: Not Answered   Continue to not smoke  Outpatient Encounter Medications as of 09/25/2018  Medication Sig   furosemide (LASIX) 80 MG tablet Take 0.5 tablets (40 mg total) by mouth 2 (two) times daily.   glipiZIDE (GLUCOTROL) 5 MG tablet Take 0.5 tablets (2.5 mg total) by mouth 2 (two) times daily before a meal.   potassium chloride (K-DUR) 10 MEQ tablet Take 1 tablet (10 mEq total) by mouth daily.   [DISCONTINUED] acetaminophen (TYLENOL) 325 MG tablet Take 2 tablets (650 mg total) by mouth every 6 (six) hours as needed for mild pain (or Fever >/= 101). (Patient not taking: Reported on 08/24/2018)   [DISCONTINUED] senna-docusate (SENOKOT-S) 8.6-50 MG tablet Take 1  tablet by mouth 2 (two) times daily. (Patient not taking: Reported on 08/24/2018)   No facility-administered encounter medications on file as of 09/25/2018.      Review of Systems  Review of Systems  Constitutional:  Positive for fatigue. Negative for activity change and fever.  HENT: Positive for trouble swallowing. Negative for sinus pressure, sinus pain and sore throat.   Respiratory: Positive for shortness of breath. Negative for cough and wheezing.        Denies hemoptysis   Cardiovascular: Positive for palpitations and leg swelling. Negative for chest pain.  Gastrointestinal: Positive for abdominal distention. Negative for diarrhea, nausea and vomiting.       Cough when eating, doesn't feel like ppi works  Musculoskeletal: Negative for arthralgias.  Neurological: Negative for dizziness.  Psychiatric/Behavioral: Negative for sleep disturbance. The patient is not nervous/anxious.      Physical Exam  BP 124/68    Pulse 79    Temp 98.2 F (36.8 C) (Oral)    Ht _0  (1.422 m)    Wt 181 lb (82.1 kg)    SpO2 97%    BMI 40.58 kg/m   Wt Readings from Last 5 Encounters:  09/25/18 181 lb (82.1 kg)  08/24/18 179 lb 9.6 oz (81.5 kg)  08/05/18 184 lb (83.5 kg)  08/04/18 184 lb (83.5 kg)  07/29/18 181 lb (82.1 kg)     Physical Exam Vitals signs and nursing note reviewed. Exam conducted with a chaperone present.  Constitutional:      General: She is not in acute distress.    Appearance: Normal appearance. She is obese.     Comments: Chronically ill elderly female  HENT:     Head: Normocephalic and atraumatic.     Right Ear: Tympanic membrane, ear canal and external ear normal. There is no impacted cerumen.     Left Ear: Tympanic membrane, ear canal and external ear normal. There is no impacted cerumen.     Nose: Nose normal. No congestion.     Mouth/Throat:     Mouth: Mucous membranes are dry.     Pharynx: Oropharynx is clear.  Eyes:     Pupils: Pupils are equal, round, and reactive to light.  Neck:     Musculoskeletal: Normal range of motion.  Cardiovascular:     Rate and Rhythm: Normal rate and regular rhythm.     Pulses: Normal pulses.     Heart sounds: Normal heart sounds. No murmur.    Pulmonary:     Effort: Pulmonary effort is normal. No respiratory distress.     Breath sounds: Normal breath sounds. No decreased air movement. No decreased breath sounds, wheezing or rales.  Abdominal:     General: Abdomen is flat. Bowel sounds are normal. There is distension.     Palpations: Abdomen is soft.  Skin:    General: Skin is warm and dry.     Capillary Refill: Capillary refill takes less than 2 seconds.       Neurological:     General: No focal deficit present.     Mental Status: She is alert and oriented to person, place, and time. Mental status is at baseline.     Gait: Gait abnormal (Walks with walker).  Psychiatric:        Mood and Affect: Mood is anxious and depressed. Affect is tearful.        Speech: Speech normal.        Behavior: Behavior normal.  Thought Content: Thought content normal.        Judgment: Judgment normal.      Lab Results:  CBC    Component Value Date/Time   WBC 12.4 (H) 09/25/2018 0957   RBC 3.96 09/25/2018 0957   HGB 11.6 (L) 09/25/2018 0957   HCT 35.5 (L) 09/25/2018 0957   PLT 593.0 (H) 09/25/2018 0957   MCV 89.6 09/25/2018 0957   MCH 27.4 07/22/2018 0746   MCHC 32.6 09/25/2018 0957   RDW 18.2 (H) 09/25/2018 0957   LYMPHSABS 5.3 (H) 09/25/2018 0957   MONOABS 0.6 09/25/2018 0957   EOSABS 0.2 09/25/2018 0957   BASOSABS 0.2 (H) 09/25/2018 0957    BMET    Component Value Date/Time   NA 137 09/25/2018 0957   K 4.0 09/25/2018 0957   CL 101 09/25/2018 0957   CO2 26 09/25/2018 0957   GLUCOSE 138 (H) 09/25/2018 0957   BUN 17 09/25/2018 0957   CREATININE 0.92 09/25/2018 0957   CALCIUM 9.4 09/25/2018 0957   GFRNONAA 28 (L) 07/22/2018 0746   GFRAA 32 (L) 07/22/2018 0746    BNP    Component Value Date/Time   BNP 344.1 (H) 07/21/2018 1101    ProBNP No results found for: PROBNP    Assessment & Plan:   Vasculitis, ANCA positive ([place]) Plan: Sed rate today Continue outlined and planned [name] infusion in  January/2021 Contacted [facility] Dr. Posey Pronto update on patient  Acute respiratory failure with hypoxia [facility]) Plan: [facility] today patient required 4 L of O2 with physical exertion Likely worsened hypoxia due to fluid overload based off her recent CT imaging Emphasized the importance of the patient being adherent to wearing her oxygen, patient has a history of noncompliance with wearing oxygen Lab work today Defer chest x-ray imaging as she is a recent CT which we can view Close follow-up with our office in 1 week I have updated nephrology  Rash Plan: Referral to dermatology  CKD (chronic kidney disease) Plan: Lab work today Resume Lasix 40 mg twice daily, discussed this with nephrology Dr. Posey Pronto Resume potassium as previously ordered by nephrology Close follow-up with our office in 1 week   Lung mass Improved on recent CT No recent hemoptysis  Plan: Continue to monitor clinically  Abnormal findings on diagnostic imaging of lung Plan: Increased oxygen needs at this point time Continue to monitor clinically Continue to receive [name] infusions under guidance of nephrology Follow-up with our office in 1 week  Shortness of breath Likely fluid overload based off of recent CT imaging as well as patient's decreased Lasix dosing over the past few weeks.  Weight stable today.  Likely abdominal edema on exam today.  Plan: Lab work today Discussed case with nephrology Dr. Posey Pronto Resume Lasix under guidance of nephrology 40 mg twice daily Close follow-up with our office in 1 week 4 L of O2 with physical exertion, 2 to 3 L at rest to maintain oxygen saturations greater than 88%  Dysphagia Suspect patient may have aspect of GERD, she does not feel the PPI has helped her.  Recent swallow study in June/2020 showed mild aspiration risk.  Plan: Continue to monitor clinically May need to consider GI referral in the future    Return in about 1 week (around 10/02/2018) for  Follow up with Wyn Quaker FNP-C, Follow up with Dr. [name].   Lauraine Rinne, NP 09/25/2018   This appointment was 45 minutes long with over 50% of the time in direct face-to-face patient  care, assessment, plan of care, and follow-up.  This time also included the discussion of plan of care with Dr. Posey Pronto over the telephone.  I have updated Dr. [name] regarding patient's plan of care.  This also required extended time due to patient's need for son to be primary interpreter.

## 2018-09-24 NOTE — Telephone Encounter (Signed)
Spoke with Julia Johns, advised her that we are waiting to speak with Dr. [name]. She verbalized understanding.

## 2018-09-25 ENCOUNTER — Encounter: Payer: Self-pay | Admitting: Pulmonary Disease

## 2018-09-25 ENCOUNTER — Ambulatory Visit (INDEPENDENT_AMBULATORY_CARE_PROVIDER_SITE_OTHER): Payer: Medicare HMO | Admitting: Pulmonary Disease

## 2018-09-25 ENCOUNTER — Telehealth: Payer: Self-pay | Admitting: Pulmonary Disease

## 2018-09-25 ENCOUNTER — Other Ambulatory Visit: Payer: Self-pay | Admitting: *Deleted

## 2018-09-25 ENCOUNTER — Other Ambulatory Visit: Payer: Self-pay

## 2018-09-25 VITALS — BP 124/68 | HR 79 | Temp 98.2°F | Ht <= 58 in | Wt 181.0 lb

## 2018-09-25 DIAGNOSIS — I776 Arteritis, unspecified: Secondary | ICD-10-CM | POA: Diagnosis not present

## 2018-09-25 DIAGNOSIS — R0602 Shortness of breath: Secondary | ICD-10-CM

## 2018-09-25 DIAGNOSIS — I7782 Antineutrophilic cytoplasmic antibody (ANCA) vasculitis: Secondary | ICD-10-CM

## 2018-09-25 DIAGNOSIS — J9601 Acute respiratory failure with hypoxia: Secondary | ICD-10-CM

## 2018-09-25 DIAGNOSIS — N184 Chronic kidney disease, stage 4 (severe): Secondary | ICD-10-CM

## 2018-09-25 DIAGNOSIS — R918 Other nonspecific abnormal finding of lung field: Secondary | ICD-10-CM | POA: Insufficient documentation

## 2018-09-25 DIAGNOSIS — R1310 Dysphagia, unspecified: Secondary | ICD-10-CM | POA: Diagnosis not present

## 2018-09-25 DIAGNOSIS — R21 Rash and other nonspecific skin eruption: Secondary | ICD-10-CM | POA: Diagnosis not present

## 2018-09-25 LAB — CBC WITH DIFFERENTIAL/PLATELET
Basophils Absolute: 0.2 10*3/uL — ABNORMAL HIGH (ref 0.0–0.1)
Basophils Relative: 1.8 % (ref 0.0–3.0)
Eosinophils Absolute: 0.2 10*3/uL (ref 0.0–0.7)
Eosinophils Relative: 1.3 % (ref 0.0–5.0)
HCT: 35.5 % — ABNORMAL LOW (ref 36.0–46.0)
Hemoglobin: 11.6 g/dL — ABNORMAL LOW (ref 12.0–15.0)
Lymphocytes Relative: 42.5 % (ref 12.0–46.0)
Lymphs Abs: 5.3 10*3/uL — ABNORMAL HIGH (ref 0.7–4.0)
MCHC: 32.6 g/dL (ref 30.0–36.0)
MCV: 89.6 fl (ref 78.0–100.0)
Monocytes Absolute: 0.6 10*3/uL (ref 0.1–1.0)
Monocytes Relative: 5.1 % (ref 3.0–12.0)
Neutro Abs: 6.1 10*3/uL (ref 1.4–7.7)
Neutrophils Relative %: 49.3 % (ref 43.0–77.0)
Platelets: 593 10*3/uL — ABNORMAL HIGH (ref 150.0–400.0)
RBC: 3.96 Mil/uL (ref 3.87–5.11)
RDW: 18.2 % — ABNORMAL HIGH (ref 11.5–15.5)
WBC: 12.4 10*3/uL — ABNORMAL HIGH (ref 4.0–10.5)

## 2018-09-25 LAB — COMPREHENSIVE METABOLIC PANEL
ALT: 20 U/L (ref 0–35)
AST: 30 U/L (ref 0–37)
Albumin: 3.3 g/dL — ABNORMAL LOW (ref 3.5–5.2)
Alkaline Phosphatase: 82 U/L (ref 39–117)
BUN: 17 mg/dL (ref 6–23)
CO2: 26 mEq/L (ref 19–32)
Calcium: 9.4 mg/dL (ref 8.4–10.5)
Chloride: 101 mEq/L (ref 96–112)
Creatinine, Ser: 0.92 mg/dL (ref 0.40–1.20)
GFR: 60.64 mL/min (ref >60.00)
Glucose, Bld: 138 mg/dL — ABNORMAL HIGH (ref 70–99)
Potassium: 4 mEq/L (ref 3.5–5.1)
Sodium: 137 mEq/L (ref 135–145)
Total Bilirubin: 0.3 mg/dL (ref 0.2–1.2)
Total Protein: 6.6 g/dL (ref 6.0–8.3)

## 2018-09-25 LAB — SEDIMENTATION RATE: Sed Rate: 114 mm/hr — ABNORMAL HIGH (ref 0–30)

## 2018-09-25 NOTE — Telephone Encounter (Signed)
Julia Johns did you mean to send an order to [employer]. I thought pt already had oxygen through [employer]. Please advise, just want to make sure I can send it.

## 2018-09-25 NOTE — Assessment & Plan Note (Signed)
Plan: Lab work today Resume Lasix 40 mg twice daily, discussed this with nephrology Dr. Posey Pronto Resume potassium as previously ordered by nephrology Close follow-up with our office in 1 week

## 2018-09-25 NOTE — Telephone Encounter (Signed)
Pt's daughter-in-law [name] calling back stating [place] hasn't received order regarding oxygen, needs 4.0 machine.  Daughter-in-law only have Pt was seen today in the office.  Also her oxygen saturation is 81, pulse 96.   [phone]

## 2018-09-25 NOTE — Patient Instructions (Addendum)
You were seen today by Lauraine Rinne, NP  for:   1. Shortness of breath  Walk today in office  Discussed the case with Dr. Posey Pronto.  He agrees with resuming 40 mg of Lasix twice daily  Maintain oxygen saturations above 88%, you must be compliant with wearing her oxygen  If symptoms worsen, oxygen levels are unable to be maintained greater than 88%, or you have worsening chest pain or heart racing then he did present to an emergency room  - Pro b natriuretic peptide (BNP); Future - Comp Met (CMET); Future - CBC with Differential/Platelet; Future - Sedimentation rate; Future  2. Stage 4 chronic kidney disease ([place])  Discussed her case with Dr. Posey Pronto today.  He would like for you to resume 40 mg of Lasix twice daily.  Resume potassium as previously instructed by nephrology.  - Comp Met (CMET); Future  3. Acute respiratory failure with hypoxia (HCC)  Walk today in office  Continue oxygen therapy as prescribed  >>>maintain oxygen saturations greater than 88 percent  >>>if unable to maintain oxygen saturations please contact the office  >>>do not smoke with oxygen  >>>can use nasal saline gel or nasal saline rinses to moisturize nose if oxygen causes dryness   4. Vasculitis, ANCA positive ([place])  Continue forward with scheduled [name] infusions as managed by nephrology  - Sedimentation rate; Future  5. Abnormal findings on diagnostic imaging of lung  Overall CT scan results are improved based off of the lung mass that we had seen in May/2020.  CT scan does show a significant amount of fluid.  I believe this is why you are having worsening shortness of breath.  I will route the results as well as my office note to Dr. Posey Pronto to update him.  6. Lung mass  Improved and resolving on recent CT.  7. Rash  Recent hospitalization rash was seen they requested you to be seen by dermatology.  I have placed a referral for this.  - Ambulatory referral to Dermatology   We recommend  today:  Orders Placed This Encounter  Procedures  . Pro b natriuretic peptide (BNP)    Standing Status:   Future    Standing Expiration Date:   09/25/2019  . Comp Met (CMET)    Standing Status:   Future    Standing Expiration Date:   09/25/2019  . CBC with Differential/Platelet    Standing Status:   Future    Standing Expiration Date:   09/25/2019  . Sedimentation rate    Standing Status:   Future    Standing Expiration Date:   09/25/2019  . Ambulatory referral to Dermatology    Referral Priority:   Urgent    Referral Type:   Consultation    Referral Reason:   Specialty Services Required    Requested Specialty:   Dermatology    Number of Visits Requested:   1   Orders Placed This Encounter  Procedures  . Pro b natriuretic peptide (BNP)  . Comp Met (CMET)  . CBC with Differential/Platelet  . Sedimentation rate  . Ambulatory referral to Dermatology   No orders of the defined types were placed in this encounter.   Follow Up:    Return in about 1 week (around 10/02/2018) for Follow up with Wyn Quaker FNP-C, Follow up with Dr. [name].   Please do your part to reduce the spread of COVID-19:      Reduce your risk of any infection  and COVID19 by using  the similar precautions used for avoiding the common cold or flu:  Marland Kitchen Wash your hands often with soap and warm water for at least 20 seconds.  If soap and water are not readily available, use an alcohol-based hand sanitizer with at least 60% alcohol.  . If coughing or sneezing, cover your mouth and nose by coughing or sneezing into the elbow areas of your shirt or coat, into a tissue or into your sleeve (not your hands). Langley Gauss A MASK when in public  . Avoid shaking hands with others and consider head nods or verbal greetings only. . Avoid touching your eyes, nose, or mouth with unwashed hands.  . Avoid close contact with people who are sick. . Avoid places or events with large numbers of people in one location, like concerts or sporting  events. . If you have some symptoms but not all symptoms, continue to monitor at home and seek medical attention if your symptoms worsen. . If you are having a medical emergency, call 911.   [place] / e-Visit: [email or web address]         [facility]: [facility]: [phone]                   [facility]: [phone]     It is flu season:   >>> Best ways to protect herself from the flu: Receive the yearly flu vaccine, practice good hand hygiene washing with soap and also using hand sanitizer when available, eat a nutritious meals, get adequate rest, hydrate appropriately   Please contact the office if your symptoms worsen or you have concerns that you are not improving.   Thank you for choosing [facility] for your healthcare, and for allowing [place] to partner with you on your healthcare journey. I am thankful to be able to provide care to you today.   Wyn Quaker FNP-C

## 2018-09-25 NOTE — Assessment & Plan Note (Addendum)
Plan: Sed rate today Continue outlined and planned [name] infusion in January/2021 Contacted [facility] Dr. Posey Pronto update on patient Rheumatology referral

## 2018-09-25 NOTE — Telephone Encounter (Signed)
It is my understanding per the patient's son that primary care was handling this.  We can always follow-up.  Yes patient should be already set up with oxygen through [employer].  I believe the patient does need a concentrator though.  Patient needs to be maintained of on oxygen saturations at 3 L at rest and 4 L with exertion.  Patient needs an oxygen concentrator at home as well as portable tanks to use for appointments.  Wyn Quaker, FNP

## 2018-09-25 NOTE — Addendum Note (Signed)
Addended by: Lauraine Rinne on: 09/25/2018 11:52 AM   Modules accepted: Orders

## 2018-09-25 NOTE — Telephone Encounter (Signed)
Contacted [facility] and they state the PCP did place an order for the oxygen and it is a 'simply go' up to 2L.  The family is picking up 2 emergency tanks today.  [facility] had requested the PCP send in an updated order to cover the increased O2 need but order has not been completed. PCP says having difficulty getting order submitted.   Contacted Karyns at [employer].  She states patient has order for home fill and has concentrator.  Read B. Mack's note to her regarding increase in O2.  Santiago Glad states she will fax [place] an order over to sign to make sure our provider signs off on this now since there are two different providers involved.  Awaiting fax to update this order.   Returned call to [employer] and spoke to [place].  Have not received faxed order. She will follow up.  Returned call to [employer]. Unable to receive fax.  Will place O2 order per Jaclynn Major instructions and route to B. Warner Mccreedy, NP for review and signature

## 2018-09-25 NOTE — Assessment & Plan Note (Signed)
Likely fluid overload based off of recent CT imaging as well as patient's decreased Lasix dosing over the past few weeks.  Weight stable today.  Likely abdominal edema on exam today.  Plan: Lab work today Discussed case with nephrology Dr. Posey Pronto Resume Lasix under guidance of nephrology 40 mg twice daily Close follow-up with our office in 1 week 4 L of O2 with physical exertion, 2 to 3 L at rest to maintain oxygen saturations greater than 88%

## 2018-09-25 NOTE — Assessment & Plan Note (Signed)
Improved on recent CT No recent hemoptysis  Plan: Continue to monitor clinically

## 2018-09-25 NOTE — Assessment & Plan Note (Signed)
Plan  Referral to dermatology

## 2018-09-25 NOTE — Assessment & Plan Note (Signed)
Plan: [facility] today patient required 4 L of O2 with physical exertion Likely worsened hypoxia due to fluid overload based off her recent CT imaging Emphasized the importance of the patient being adherent to wearing her oxygen, patient has a history of noncompliance with wearing oxygen Lab work today Defer chest x-ray imaging as she is a recent CT which we can view Close follow-up with our office in 1 week I have updated nephrology

## 2018-09-25 NOTE — Assessment & Plan Note (Signed)
Suspect patient may have aspect of GERD, she does not feel the PPI has helped her.  Recent swallow study in June/2020 showed mild aspiration risk.  Plan: Continue to monitor clinically May need to consider GI referral in the future

## 2018-09-25 NOTE — Patient Outreach (Signed)
[name]) Care Management  09/25/2018  Julia Johns Aug 07, 1950 [id number]   Care Coordination  2481 Returned call to [employer] to confirm if they have received patient new oxygen orders, they reviewed and states that they have not.  [place] call to Pulmonary office spoke with representative to explain patient has new orders for home oxygen at 3 liters and she will need additional oxygen equipment at home, she only has simply go machine that goes to 2 liters and her new orders are for 3 liters.    1350  Received call carlos, patient concern regarding 2 emergency tanks are now  empty and patient oxygen saturation 81% on portable oxygen machine that only goes to 2 liters. He denies patient struggling to breath   Placed call to [employer] to discuss being able to get tanks refilled, into homefill system orders obtained from Pulmonary office that has been requested.  Placed call to Julia Johns to include on the call, representative [place] explained even if getting new orders today, with delivery unsure ,stating that it may take up to 24 hours before system arrives.  She suggested taking empty tanks into [employer] store for refill and they can ask for additional tanks to have in the home, Julia Johns is agreeable to going into store to have tanks on hand.  Reassured him pulmonary office is working on getting orders to [facility] for . 1500 Received return call from Julia Johns that he is at [employer] picking up oxygen tanks , discussed with him telephone outreach from Nurse Langley Gauss at pulmonary office that they are sending orders to [employer] for needed oxygen supplies, homefill so that patient can wear oxygen at prescribed level to maintain oxygen levels at 88%.   I have also by inbasket Communicated with Wyn Quaker, Np regarding need for current oxygen supplies, orders being sent confirmed.   Plan  Will plan follow up call to patient in the next week.  Reinforced seeking medical follow up  for worsening of shortness of breath cough or sudden weight gain .    [name], RN, [place] Management Coordinator  [phone]- Mobile [phone]- Toll Free Main Office

## 2018-09-25 NOTE — Telephone Encounter (Signed)
Okay thank you.  Signed.  Wyn Quaker FNP

## 2018-09-25 NOTE — Assessment & Plan Note (Signed)
Plan: Increased oxygen needs at this point time Continue to monitor clinically Continue to receive [name] infusions under guidance of nephrology Follow-up with our office in 1 week

## 2018-09-26 DIAGNOSIS — R918 Other nonspecific abnormal finding of lung field: Secondary | ICD-10-CM | POA: Diagnosis not present

## 2018-09-27 DIAGNOSIS — R918 Other nonspecific abnormal finding of lung field: Secondary | ICD-10-CM | POA: Diagnosis not present

## 2018-09-29 DIAGNOSIS — R918 Other nonspecific abnormal finding of lung field: Secondary | ICD-10-CM | POA: Diagnosis not present

## 2018-09-30 DIAGNOSIS — D631 Anemia in chronic kidney disease: Secondary | ICD-10-CM | POA: Diagnosis not present

## 2018-09-30 DIAGNOSIS — N189 Chronic kidney disease, unspecified: Secondary | ICD-10-CM | POA: Diagnosis not present

## 2018-09-30 DIAGNOSIS — N179 Acute kidney failure, unspecified: Secondary | ICD-10-CM | POA: Diagnosis not present

## 2018-09-30 LAB — PRO B NATRIURETIC PEPTIDE: NT-Pro BNP: 155 pg/mL (ref 0–301)

## 2018-09-30 NOTE — Progress Notes (Signed)
No changes based off lab work completed today.  Please make sure that lab work is also routed to [name] Dr. Posey Pronto.  Please also make sure that patient has upcoming appointment with [name].  If not we can further discuss at office visit later this week.  Is patient doing better physically with the Lasix being restarted?  Julia Quaker, FNP

## 2018-10-01 ENCOUNTER — Other Ambulatory Visit: Payer: Self-pay | Admitting: *Deleted

## 2018-10-01 DIAGNOSIS — L89221 Pressure ulcer of left hip, stage 1: Secondary | ICD-10-CM | POA: Diagnosis not present

## 2018-10-01 DIAGNOSIS — J9621 Acute and chronic respiratory failure with hypoxia: Secondary | ICD-10-CM | POA: Diagnosis not present

## 2018-10-01 DIAGNOSIS — L89213 Pressure ulcer of right hip, stage 3: Secondary | ICD-10-CM | POA: Diagnosis not present

## 2018-10-01 DIAGNOSIS — I776 Arteritis, unspecified: Secondary | ICD-10-CM | POA: Diagnosis not present

## 2018-10-01 NOTE — Progress Notes (Signed)
_0  ID: Julia Johns, female    DOB: 11/29/1950, 68 y.o.   MRN: [id number]  Chief Complaint  Patient presents with   Follow-up    Has improved since last visit currently on O2_1  DME [facility]    Referring provider: [name], MD  HPI:  68 year old female never smoker followed in our office for abnormal CT showing lung mass in May/2020.  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  She is managed by Dr. Posey Pronto at [name].  She received 4 weeks of [name] and now is on a every 49-monthschedule.  Next due [name] infusion in January 2021.  PMH: ANCA vasculitis, chronic kidney disease stage IV, history of hemoptysis, GERD, obesity Smoker/ Smoking History: Never smoker Maintenance: Receives [name] every 6 months-due next in January/2021 Pt of: Dr. AElsworth Soho 10/02/2018  - Visit   68year old female never smoker followed in our office for abnormal CT imaging that was showing a lung mass.  Patient was later diagnosed with ANCA vasculitis.  She had multiple hospital admissions at that time.  Patient developed chronic kidney disease stage IV due to the ANCA vasculitis which is now being treated with [name] infusions every 6 months by Dr. PPosey Prontoin nephrology.  Patient does not have an outpatient rheumatologist at this time.  There is a current referral in place to see Dr. DEstanislado Pandy    This is a 1 week follow-up visit to monitor patient's shortness of breath.  Patient was seen last week after a CT appeared to be fluid overloaded.  Patient was also having higher oxygen needs.  Patient was started on oxygen therapy last week.  3 L at rest and 4 L with exertion.  Patient reporting today that she feels much better after receiving her Lasix.  Her oxygen levels have also been much more stable and above 90%.  She is satting 100% today on 3 L of O2.  Her DME is [facility].  She is  received her home concentrator.  Patient feels that she has improved.  She is still frustrated that she is tired and slow to recover but she knows that this is also due to the fact that she has been hospitalized multiple times over the last 3 months and has been inactive.  She is working to improve this.   Tests:   06/16/2018-CTA chest- no PE, fairly small right pleural effusions, slightly larger than recent studies, multifocal airspace consolidation progressed suspect consolidation surrounding mass in left lower lobe posteriorly, subcentimeter lymph nodes, enlarged left lobe of thyroid with dominant thyroid mass measuring 2.8 x 2.7 cm and left lobe, may be prudent to correlate with ultrasound given dominant thyroid mass on left, the paraspinous lesion from T6-T10 noted on recent studies is better seen on recent thoracic MRI and CT chest not bolus time to optimize arterial vascular visualization, neoplastic etiology of this.  Spinous lesion may be of concern as noted recent thoracic MR, mild reflux contrast tip into the inferior vena cava and hepatic veins may indicate increase in right heart pressure  5/27 AFB > neg 5/27 expectorated sputum > few candida albicans.  5/28 BAL > negative 5/28 Fungus culture >>> 5/28 AFB BAL > negative  Bronchoscopy 6/4 of LLL pathology showing atypical squamous cells favored to be reactive. No malignancy.   09/23/2018-CT chest without contrast-intralobular septal thickening in the upper lungs bilaterally with bilateral diffuse groundglass attenuation demonstrating slight sparing  of lower lungs, imaging features nonspecific and edema infection inflammatory disease and hemorrhage all can have this appearance, focal area of consolidative opacity in the left lower lobe improved when compared to 06/16/2018, mosaic attenuation in the lungs bilaterally, nonspecific but likely reflecting air trapping, prominence of main pulmonary arteries suggest pulmonary arterial  hypertension  06/16/2018-echocardiogram-LV ejection fraction 60 to 65%, right ventricle is normal normal systolic function, estimated pressure of 26.4  06/24/2018-swallow study- mild aspiration risk, no treatment recommendations at this time, regular solids, thin liquids, home meds with liquid  06/15/2018-connective tissue work-up Cytoplasmic C ANCA-1: 80 P ANCA- less than 1: 20 Atypical p-ANCA titer less than 1: 20 C-reactive protein-38.1 Sed rate-124  07/05/2018- connective tissue work-up ANCA proteinase- 23.2 ANA negative Anti-nuclear antibody negative ASO-124 Double-stranded DNA antibody-negative GNB AB-3 MPO antibody-less than 9, negative  SIX MIN WALK 10/02/2018 09/25/2018  Supplimental Oxygen during Test? (L/min) - Yes  O2 Flow Rate 98 3  Type - Continuous  Tech Comments: Pt walked at slow pace c/o weakness in arms and legs. O2 Sat dropped halfway through lap 1 started on O2_0  titrated up to 2L final SAT 100% on 2L. Patient completed a half lap on 3L before O2 dropped to 85%. O2 was increased to 4L, O2 recovered to 93%. Patient felt SOB and weak after walk.     FENO:  No results found for: NITRICOXIDE  PFT: No flowsheet data found.  Imaging: Ct Chest Wo Contrast  Result Date: 09/23/2018 CLINICAL DATA:  Decreased O2 sats with shortness of breath and cough. EXAM: CT CHEST WITHOUT CONTRAST TECHNIQUE: Multidetector CT imaging of the chest was performed following the standard protocol without IV contrast. COMPARISON:  06/16/2018 FINDINGS: Cardiovascular: Heart is mildly enlarged. No substantial pericardial effusion. No thoracic aortic aneurysm. Enlargement of the main pulmonary arteries suggests pulmonary arterial hypertension. Mediastinum/Nodes: Stable asymmetric elevation left thyroid lobe, potentially related to a 2.2 cm left thyroid nodule. 8 mm short axis left paratracheal node is similar to prior. 8 mm short axis prevascular lymph node was 6 mm previously. No evidence for gross  hilar lymphadenopathy although assessment is limited by the lack of intravenous contrast on today's study. The esophagus has normal imaging features. There is no axillary lymphadenopathy. Lungs/Pleura: Interlobular septal thickening noted in the upper lungs bilaterally. There is diffuse ground-glass attenuation bilaterally with an upper lung predominance. Areas of mosaic attenuation in the mid and lower lungs are nonspecific but may reflect air trapping. Wedge-shaped area of consolidative opacity noted posterior left lower lobe. Upper Abdomen: The liver shows diffusely decreased attenuation suggesting fat deposition. Musculoskeletal: No worrisome lytic or sclerotic osseous abnormality. IMPRESSION: 1. Interlobular septal thickening in the upper lungs bilaterally with bilateral diffuse ground-glass attenuation demonstrating slight sparing of the lower lungs. Imaging features nonspecific and edema, infection, inflammatory disease and hemorrhage could all have this appearance. 2. Focal area of consolidative opacity in the left lower lobe, improved when comparing to 06/16/2018. 3. Mosaic attenuation in the lungs bilaterally, nonspecific but likely reflecting air trapping. 4. Upper normal mediastinal lymph nodes, presumably reactive. 5. Prominence of the main pulmonary arteries suggests pulmonary arterial hypertension. Electronically Signed   By: Misty Stanley M.D.   On: 09/23/2018 20:13      Specialty Problems      Pulmonary Problems   Lung mass    06/16/2018-CTA chest- no PE, fairly small right pleural effusions, slightly larger than recent studies, multifocal airspace consolidation progressed suspect consolidation surrounding mass in left lower lobe posteriorly, subcentimeter lymph nodes,  enlarged left lobe of thyroid with dominant thyroid mass measuring 2.8 x 2.7 cm and left lobe, may be prudent to correlate with ultrasound given dominant thyroid mass on left, the paraspinous lesion from T6-T10 noted on recent  studies is better seen on recent thoracic MRI and CT chest not bolus time to optimize arterial vascular visualization, neoplastic etiology of this.  Spinous lesion may be of concern as noted recent thoracic MR, mild reflux contrast tip into the inferior vena cava and hepatic veins may indicate increase in right heart pressure  5/27 AFB > neg 5/27 expectorated sputum > few candida albicans.  5/28 BAL > negative 5/28 Fungus culture >>> 5/28 AFB BAL > negative  Bronchoscopy 6/4 of LLL pathology showing atypical squamous cells favored to be reactive. No malignancy.   09/23/2018-CT chest without contrast-intralobular septal thickening in the upper lungs bilaterally with bilateral diffuse groundglass attenuation demonstrating slight sparing of lower lungs, imaging features nonspecific and edema infection inflammatory disease and hemorrhage all can have this appearance, focal area of consolidative opacity in the left lower lobe improved when compared to 06/16/2018, mosaic attenuation in the lungs bilaterally, nonspecific but likely reflecting air trapping, prominence of main pulmonary arteries suggest pulmonary arterial hypertension      Lobar pneumonia (HCC)   Chronic respiratory failure with hypoxia (HCC)   Hemoptysis   Shortness of breath      Allergies  Allergen Reactions   Penicillins Anaphylaxis    Patient stated that within a few minutes of receiving a penicillin IM injection she rapidly became unconscious and required "additional" medication to recover. This occurred when she was ~68 years old and she does not recall what was given to assist in her recovery. She did not develop a rash.     Immunization History  Administered Date(s) Administered   Influenza-Unspecified 10/23/2017    Past Medical History:  Diagnosis Date   Chronic kidney disease    Diabetes mellitus without complication ([place])     Tobacco History: Social History   Tobacco Use  Smoking Status Never Smoker   Smokeless Tobacco Never Used   Counseling given: Yes   Continue to not smoke  Outpatient Encounter Medications as of 10/02/2018  Medication Sig   furosemide (LASIX) 80 MG tablet Take 0.5 tablets (40 mg total) by mouth 2 (two) times daily.   glipiZIDE (GLUCOTROL) 5 MG tablet Take 0.5 tablets (2.5 mg total) by mouth 2 (two) times daily before a meal.   potassium chloride (K-DUR) 10 MEQ tablet Take 1 tablet (10 mEq total) by mouth daily.   No facility-administered encounter medications on file as of 10/02/2018.      Review of Systems  Review of Systems  Constitutional: Positive for fatigue. Negative for activity change and fever.  HENT: Negative for sinus pressure, sinus pain and sore throat.   Respiratory: Positive for shortness of breath (improved). Negative for cough and wheezing.   Cardiovascular: Positive for leg swelling (improved). Negative for chest pain and palpitations.  Gastrointestinal: Negative for diarrhea, nausea and vomiting.  Musculoskeletal: Negative for arthralgias.  Neurological: Negative for dizziness.  Psychiatric/Behavioral: Negative for sleep disturbance. The patient is not nervous/anxious.      Physical Exam  BP 110/68 (BP Location: Left Arm, Cuff Size: Normal)    Pulse 73    Temp (!) 97.3 F (36.3 C) (Oral)    Ht _0  (1.422 m)    Wt 182 lb 6.4 oz (82.7 kg)    SpO2 100%    BMI 40.89  kg/m   Wt Readings from Last 5 Encounters:  10/02/18 182 lb 6.4 oz (82.7 kg)  09/25/18 181 lb (82.1 kg)  08/24/18 179 lb 9.6 oz (81.5 kg)  08/05/18 184 lb (83.5 kg)  08/04/18 184 lb (83.5 kg)     Physical Exam Vitals signs and nursing note reviewed.  Constitutional:      General: She is not in acute distress.    Appearance: Normal appearance.  HENT:     Head: Normocephalic and atraumatic.     Right Ear: Tympanic membrane, ear canal and external ear normal. There is no impacted cerumen.     Left Ear: Tympanic membrane, ear canal and external ear normal. There  is no impacted cerumen.     Nose: Nose normal. No congestion or rhinorrhea.     Mouth/Throat:     Mouth: Mucous membranes are dry.     Dentition: Abnormal dentition. Has dentures.     Pharynx: Oropharynx is clear.  Eyes:     Pupils: Pupils are equal, round, and reactive to light.  Neck:     Musculoskeletal: Normal range of motion.  Cardiovascular:     Rate and Rhythm: Normal rate and regular rhythm.     Pulses: Normal pulses.     Heart sounds: Normal heart sounds. No murmur.  Pulmonary:     Effort: Pulmonary effort is normal. No respiratory distress.     Breath sounds: Normal breath sounds. No decreased air movement. No decreased breath sounds, wheezing or rales.  Musculoskeletal:     Right lower leg: Edema (trace) present.     Left lower leg: Edema (trace) present.  Skin:    General: Skin is warm and dry.     Capillary Refill: Capillary refill takes less than 2 seconds.       Neurological:     General: No focal deficit present.     Mental Status: She is alert and oriented to person, place, and time. Mental status is at baseline.     Gait: Gait abnormal (walks with walker ).  Psychiatric:        Mood and Affect: Mood normal.        Behavior: Behavior normal.        Thought Content: Thought content normal.        Judgment: Judgment normal.      Lab Results:  CBC    Component Value Date/Time   WBC 12.4 (H) 09/25/2018 0957   RBC 3.96 09/25/2018 0957   HGB 11.6 (L) 09/25/2018 0957   HCT 35.5 (L) 09/25/2018 0957   PLT 593.0 (H) 09/25/2018 0957   MCV 89.6 09/25/2018 0957   MCH 27.4 07/22/2018 0746   MCHC 32.6 09/25/2018 0957   RDW 18.2 (H) 09/25/2018 0957   LYMPHSABS 5.3 (H) 09/25/2018 0957   MONOABS 0.6 09/25/2018 0957   EOSABS 0.2 09/25/2018 0957   BASOSABS 0.2 (H) 09/25/2018 0957    BMET    Component Value Date/Time   NA 137 09/25/2018 0957   K 4.0 09/25/2018 0957   CL 101 09/25/2018 0957   CO2 26 09/25/2018 0957   GLUCOSE 138 (H) 09/25/2018 0957   BUN  17 09/25/2018 0957   CREATININE 0.92 09/25/2018 0957   CALCIUM 9.4 09/25/2018 0957   GFRNONAA 28 (L) 07/22/2018 0746   GFRAA 32 (L) 07/22/2018 0746    BNP    Component Value Date/Time   BNP 344.1 (H) 07/21/2018 1101    ProBNP    Component Value Date/Time  PROBNP 155 09/25/2018 0957      Assessment & Plan:   Vasculitis, 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 Posey Pronto update on patient Rheumatology referral   Chronic respiratory failure with hypoxia [facility]) Plan: Walk in office reveals patient needs 2 L of O2 with physical exertion Overnight oximetry test ordered Patient needs to work to increase her daily physical activity  CKD (chronic kidney disease) Improved on last c-Met  Plan: C-Met today to follow patient's kidney function as patient recently restarted Lasix and has been supplementing potassium Continue to follow-up with Dr. Posey Pronto with nephrology Continue [name] infusions for ANCA vasculitis  Bilateral lower extremity edema Improved on today's exam  Abnormal findings on diagnostic imaging of lung Plan: We will follow clinically at this time  Shortness of breath Improved at this weeks 1 week follow-up.  Patient is also resumed taking her Lasix which has helped.  Plan: Walk today in office, patient required 2 L of O2 with physical exertion Patient needs to work to increase daily physical activity  Rash Plan: Continue forward with referral to Dr. Allyson Sabal    Return in about 6 weeks (around 11/13/2018) for Follow up with Dr. [name], Follow up with Wyn Quaker FNP-C.   Lauraine Rinne, NP 10/02/2018   This appointment was 42 minutes long with over 50% of the time in direct face-to-face patient care, assessment, plan of care, and follow-up.

## 2018-10-01 NOTE — Progress Notes (Signed)
Noted. Thanks  B

## 2018-10-01 NOTE — Patient Outreach (Signed)
[facility]) Care Management  10/01/2018  Xian Apostol [birth year] [phone]   Telephone assessment  Subjective Successful outreach call to patient son [name] , he discussed patient is doing much better than last week stating she is getting back to her usual.   He further discussed  Shortness of breath  Patient son reports patient improved, she is wearing oxygen at 3 liters at rest and 4 liters when up walking. He discussed getting oxygen tanks filled at [facility] on 9/4 and on 9/7, states new oxygen concentrator arrived on 9/8. Patient oxygen saturation at 97% she has denied shortness of breath increased of cough . He reports he continues to give patient lasix and potassium as prescribed .   Has follow up with Pulmonary on 9/11.  Chronic kidney disease Patient son discussed hemoglobin has improved is to 11.7 at visit on this week.  Patient son reports patient weight is 178-181 pounds she weighs about 3 days a week as he leaves early in the morning and patient is sometimes asleep. He discussed decreased swelling in her legs. Discussed sooner  follow up with nephrology as recommended, he reports planning to call when off on tomorrow.  Discussed recent referrals to Dermatology and Rheumatology due to concerns with rash on legs  reports not receiving phone calls yet.  Diabetes  [name] reports patient blood sugars have improved, readings in the am, 113-130, and in the evening under 200, he denies patient having low reading . He states patient has a better understanding of what to eat and having smaller portions.     Plan Will plan follow up call in the next 2  weeks , will coordinate with patient son/ daughter in law to be able to talk with patient when they are home with use of pacific interpreter as patient  does answer the phone.    [name], RN, [place] Management Coordinator  [phone]- Mobile [phone]- Toll Free Main  Office

## 2018-10-01 NOTE — Progress Notes (Signed)
Noted.  As explained to patient son if symptoms are worsening or not improving then patient may need to present to the emergency room for further evaluation.As also explained to them I would recommend that they follow-up with Dr. Serita Grit office sooner release be in contact with them regarding their symptoms.If patient is stable then keep follow-up appointment with [place] on Friday.Wyn Quaker, FNP

## 2018-10-02 ENCOUNTER — Telehealth: Payer: Self-pay

## 2018-10-02 ENCOUNTER — Encounter: Payer: Self-pay | Admitting: Pulmonary Disease

## 2018-10-02 ENCOUNTER — Ambulatory Visit: Payer: Medicare HMO | Admitting: Pulmonary Disease

## 2018-10-02 ENCOUNTER — Other Ambulatory Visit: Payer: Self-pay

## 2018-10-02 VITALS — BP 110/68 | HR 73 | Temp 97.3°F | Ht <= 58 in | Wt 182.4 lb

## 2018-10-02 DIAGNOSIS — R600 Localized edema: Secondary | ICD-10-CM | POA: Diagnosis not present

## 2018-10-02 DIAGNOSIS — N181 Chronic kidney disease, stage 1: Secondary | ICD-10-CM | POA: Diagnosis not present

## 2018-10-02 DIAGNOSIS — J9611 Chronic respiratory failure with hypoxia: Secondary | ICD-10-CM

## 2018-10-02 DIAGNOSIS — R21 Rash and other nonspecific skin eruption: Secondary | ICD-10-CM | POA: Diagnosis not present

## 2018-10-02 DIAGNOSIS — R918 Other nonspecific abnormal finding of lung field: Secondary | ICD-10-CM

## 2018-10-02 DIAGNOSIS — I7782 Antineutrophilic cytoplasmic antibody (ANCA) vasculitis: Secondary | ICD-10-CM

## 2018-10-02 DIAGNOSIS — I776 Arteritis, unspecified: Secondary | ICD-10-CM | POA: Diagnosis not present

## 2018-10-02 DIAGNOSIS — R0602 Shortness of breath: Secondary | ICD-10-CM | POA: Diagnosis not present

## 2018-10-02 LAB — COMPREHENSIVE METABOLIC PANEL
ALT: 26 U/L (ref 0–35)
AST: 35 U/L (ref 0–37)
Albumin: 3.4 g/dL — ABNORMAL LOW (ref 3.5–5.2)
Alkaline Phosphatase: 83 U/L (ref 39–117)
BUN: 14 mg/dL (ref 6–23)
CO2: 29 mEq/L (ref 19–32)
Calcium: 9.4 mg/dL (ref 8.4–10.5)
Chloride: 100 mEq/L (ref 96–112)
Creatinine, Ser: 0.93 mg/dL (ref 0.40–1.20)
GFR: 59.89 mL/min — ABNORMAL LOW (ref >60.00)
Glucose, Bld: 106 mg/dL — ABNORMAL HIGH (ref 70–99)
Potassium: 3.5 mEq/L (ref 3.5–5.1)
Sodium: 138 mEq/L (ref 135–145)
Total Bilirubin: 0.3 mg/dL (ref 0.2–1.2)
Total Protein: 6.5 g/dL (ref 6.0–8.3)

## 2018-10-02 NOTE — Assessment & Plan Note (Signed)
Improved on last c-Met  Plan: C-Met today to follow patient's kidney function as patient recently restarted Lasix and has been supplementing potassium Continue to follow-up with Dr. Posey Pronto with nephrology Continue [name] infusions for ANCA vasculitis

## 2018-10-02 NOTE — Patient Instructions (Addendum)
You were seen today by Lauraine Rinne, NP  for:   1. Acute respiratory failure with hypoxia (HCC)  Walk in office today  Continue oxygen therapy as prescribed  >>>maintain oxygen saturations greater than 88 percent  >>>if unable to maintain oxygen saturations please contact the office  >>>do not smoke with oxygen  >>>can use nasal saline gel or nasal saline rinses to moisturize nose if oxygen causes dryness   2. Abnormal findings on diagnostic imaging of lung  Clear breath sounds on exam today We will continue to monitor clinically  3. Shortness of breath Improved on today's exam this is good news Improved hemoglobin to 11.7  Continue take your Lasix as prescribed  Walk in office today  4. 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 Estanislado Pandy  - Comp Met (CMET); Future  5. Stage 1 chronic kidney disease  Continue to take your Lasix as prescribed Continue to take your potassium as prescribed Continue to have follow-up with Dr. Posey Pronto with nephrology  - Comp Met (CMET); Future  6. Rash  Referral placed to dermatology Dr. Allyson Sabal   We recommend today:  Orders Placed This Encounter  Procedures  . Comp Met (CMET)    Standing Status:   Future    Standing Expiration Date:   10/02/2019   Orders Placed This Encounter  Procedures  . Comp Met (CMET)   No orders of the defined types were placed in this encounter.   Follow Up:    Return in about 6 weeks (around 11/13/2018) for Follow up with Dr. [name], Follow up with Wyn Quaker FNP-C.   Please do your part to reduce the spread of COVID-19:      Reduce your risk of any infection  and COVID19 by using the similar precautions used for avoiding the common cold or flu:  Marland Kitchen Wash your hands often with soap and warm water for at least 20 seconds.  If soap and water are not readily available, use an alcohol-based hand sanitizer with at least 60% alcohol.  . If coughing or sneezing, cover your mouth and  nose by coughing or sneezing into the elbow areas of your shirt or coat, into a tissue or into your sleeve (not your hands). Langley Gauss A MASK when in public  . Avoid shaking hands with others and consider head nods or verbal greetings only. . Avoid touching your eyes, nose, or mouth with unwashed hands.  . Avoid close contact with people who are sick. . Avoid places or events with large numbers of people in one location, like concerts or sporting events. . If you have some symptoms but not all symptoms, continue to monitor at home and seek medical attention if your symptoms worsen. . If you are having a medical emergency, call 911.   [place] / e-Visit: [email or web address]         [facility]: [facility]: [phone]                   [facility]: [phone]     It is flu season:   >>> Best ways to protect herself from the flu: Receive the yearly flu vaccine, practice good hand hygiene washing with soap and also using hand sanitizer when available, eat a nutritious meals, get adequate rest, hydrate appropriately   Please contact the office if your symptoms worsen or you have concerns that you are not  improving.   Thank you for choosing [facility] for your healthcare, and for allowing [place] to partner with you on your healthcare journey. I am thankful to be able to provide care to you today.   Wyn Quaker FNP-C

## 2018-10-02 NOTE — Assessment & Plan Note (Signed)
Improved at this weeks 1 week follow-up.  Patient is also resumed taking her Lasix which has helped.  Plan: Walk today in office, patient required 2 L of O2 with physical exertion Patient needs to work to increase daily physical activity

## 2018-10-02 NOTE — Progress Notes (Signed)
Lab work is stable.  Keep Lasix dose the same.  Potassium has dropped slightly.  Will route results to nephrology.  Nephrology can make the decision regarding changing patient supplemental potassium.  Potassium has decreased down to 3.5.  Likely will need to increase potassium to 20 mEq daily.  For right now go ahead and increase to 20 mEq daily of potassium.  Please contact nephrology for further instructions.  Wyn Quaker, FNP

## 2018-10-02 NOTE — Telephone Encounter (Signed)
-----   Message from Lauraine Rinne, NP sent at 10/02/2018  2:28 PM EDT ----- Lab work is stable.  Keep Lasix dose the same.  Potassium has dropped slightly.  Will route results to nephrology.  Nephrology can make the decision regarding changing patient supplemental potassium.  Potassium has decreased down to 3.5.  Likely will need to increase potassium to 20 mEq daily.  For right now go ahead and increase to 20 mEq daily of potassium.  Please contact nephrology for further instructions.  Wyn Quaker, FNP

## 2018-10-02 NOTE — Assessment & Plan Note (Signed)
Plan: Walk in office reveals patient needs 2 L of O2 with physical exertion Overnight oximetry test ordered Patient needs to work to increase her daily physical activity

## 2018-10-02 NOTE — Assessment & Plan Note (Signed)
Plan: C-Met today Walk in office today revealed patient needs 2 L of O2 with physical exertion Continue outlined and planned [name] infusion in January/2021 Contacted [place] [facility] Dr. Posey Pronto update on patient Rheumatology referral

## 2018-10-02 NOTE — Assessment & Plan Note (Signed)
Plan: Continue forward with referral to Dr. Allyson Sabal

## 2018-10-02 NOTE — Assessment & Plan Note (Signed)
Plan: We will follow clinically at this time

## 2018-10-02 NOTE — Assessment & Plan Note (Signed)
Improved on today's exam

## 2018-10-02 NOTE — Telephone Encounter (Signed)
ATC pt using Interpreter services Janett Billow [phone]. LMOM for pt to return call re lab results.

## 2018-10-05 NOTE — Telephone Encounter (Signed)
ATC using Spanish interpreter without success. LMOM for pt to return call. Lab results have also been forwarded to Dr.Patel.

## 2018-10-06 NOTE — Telephone Encounter (Signed)
Called [employer] at [phone]. A VM was left for pt. Will leave message open to try again as results are abnormal.

## 2018-10-07 DIAGNOSIS — N179 Acute kidney failure, unspecified: Secondary | ICD-10-CM | POA: Diagnosis not present

## 2018-10-07 DIAGNOSIS — D631 Anemia in chronic kidney disease: Secondary | ICD-10-CM | POA: Diagnosis not present

## 2018-10-07 DIAGNOSIS — N189 Chronic kidney disease, unspecified: Secondary | ICD-10-CM | POA: Diagnosis not present

## 2018-10-07 NOTE — Telephone Encounter (Signed)
ATC via [employer]- pt did not answer so unable to leave msg this time her mailbox was full

## 2018-10-08 ENCOUNTER — Other Ambulatory Visit: Payer: Self-pay | Admitting: *Deleted

## 2018-10-08 ENCOUNTER — Telehealth: Payer: Self-pay | Admitting: Pulmonary Disease

## 2018-10-08 NOTE — Patient Outreach (Signed)
[facility]) Care Management  10/08/2018  Julia Johns Jan 06, 1951 [id number]  Telephone assessment   [name], 68 y/o femalecurrently active with [place] after recent hospitalizationJune 12-18, 2020 for acute renal failure;vasculitis  patient was discharged home to self-care with home health services in place. Patientexperienced hospital re-admission  on June 29- July 22, 2018 for hemoptysis, acute cystitis with hematuria, and hypotension.  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   Subjective  Successful outreach call to patient son, Julia Johns as agreed by patient for primary contact.   He discussed that patient continues to do well at home, seems to have a little more energy.   He further discussed :  ANCA vasculitisChronic kidney disease/Anemia He reports patient hemoglobin up to 12.2 at visit on 9/16 and she did not have to receive ritacrit injection due to being in goal level, she will now follow up in 2 weeks.  Patient weights are staying in the 175 to 178 range on increase in swelling. Julia Johns reports patient taking lasix twice daily and one potassium daily.  Shortness of breath Patient continues to wear oxygen at 2 to 3 liters at home, denies cough, complaint of occasional headache.  Patient son has been in touch with advanced home care and plans to pick up equipment on tomorrow to monitor oxygen during sleep.  Diabetes Patient son assist with monitoring blood sugar one to twice daily and has reading in the morning have been 120-130.   Noted attempts from Pulmonary office to contact patient unsuccessful, discussed with son he states patient does not answer her phone unless it is from numbers in her contact. Or she  doesn't know which button to press. Encouraged him to place medical office numbers, my contact number in her contacts list.   Julia Johns states that patient has signed paper at Pulmonary office that he and Julia Johns be  notified of patient appointments, or needed follow up.  Placed call to Pulmonary office to explain above  spoke with representative Julia Johns to verify that she had Designated party release at their office she verified that she does. She verified names on form Julia Johns and [employer], reviewed contact information as in [place]. Representative states that she will send message to nurse to update regarding DPR and contact information .    Medical appointments Patient has follow up visit with Dr.Cox on 9/18 Discussed with Julia Johns, referral appointments he reports not receiving call from referrals recently made to Dermatology or Rheumatology, he will check he moms phone for messages.  Encouraged sooner follow up with Dr. Posey Pronto, 10/14 [facility] outpatient ,  Check Hemoglobin and Retacrit treatment 9/30 if hemogloblin less than 12.  Plan Will plan follow up call to family, in the next week to arrange call to speak with patient via  pacific interpreter in the next 2 weeks when they are at home . Will follow up with pulmonary office to verify that they have been able to contact patient if needed.  Reinforced patient son to check patient phone for missed message from medical offices.  Will communicate by in basket message to Julia Johns, [place] that patient son has not received contact from referrals placed for dermatology and rheumatology.    [name], RN, [place] Management Coordinator  [phone]- Mobile [phone]- Toll Free Main Office

## 2018-10-08 NOTE — Telephone Encounter (Signed)
-----   Message from Lauraine Rinne, NP sent at 10/02/2018  2:28 PM EDT ----- Lab work is stable.  Keep Lasix dose the same.  Potassium has dropped slightly.  Will route results to nephrology.  Nephrology can make the decision regarding changing patient supplemental potassium.  Potassium has decreased down to 3.5.  Likely will need to increase potassium to 20 mEq daily.  For right now go ahead and increase to 20 mEq daily of potassium.  Please contact nephrology for further instructions.  Wyn Quaker, FNP  Spoke with patient's son [name]. He verbalized understanding about results. He stated that she has enough potassium on hand to double the potassium.   Nothing further needed at time of call.

## 2018-10-08 NOTE — Telephone Encounter (Signed)
See encounter from today 10/08/18

## 2018-10-09 ENCOUNTER — Telehealth: Payer: Self-pay | Admitting: Pulmonary Disease

## 2018-10-09 DIAGNOSIS — R0902 Hypoxemia: Secondary | ICD-10-CM | POA: Diagnosis not present

## 2018-10-09 DIAGNOSIS — M545 Low back pain: Secondary | ICD-10-CM | POA: Diagnosis not present

## 2018-10-09 DIAGNOSIS — E1142 Type 2 diabetes mellitus with diabetic polyneuropathy: Secondary | ICD-10-CM | POA: Diagnosis not present

## 2018-10-09 DIAGNOSIS — I776 Arteritis, unspecified: Secondary | ICD-10-CM | POA: Diagnosis not present

## 2018-10-09 DIAGNOSIS — J449 Chronic obstructive pulmonary disease, unspecified: Secondary | ICD-10-CM | POA: Diagnosis not present

## 2018-10-09 DIAGNOSIS — I7782 Antineutrophilic cytoplasmic antibody (ANCA) vasculitis: Secondary | ICD-10-CM

## 2018-10-09 DIAGNOSIS — M25561 Pain in right knee: Secondary | ICD-10-CM | POA: Diagnosis not present

## 2018-10-09 DIAGNOSIS — M25562 Pain in left knee: Secondary | ICD-10-CM | POA: Diagnosis not present

## 2018-10-09 NOTE — Telephone Encounter (Signed)
I have sent an urgent referral to [facility].

## 2018-10-09 NOTE — Telephone Encounter (Signed)
I called pt's son to let him know about transferring the referral to [facility], he understood and nothing further is needed.

## 2018-10-09 NOTE — Telephone Encounter (Signed)
10/09/2018 1004  Triage:  Please see the message listed below which we received from Dr. Estanislado Pandy office with rheumatology:   We appreciate your referral. Each referral is reviewed by our providers to ensure that the patient will receive the best medical care possible. Upon review of this referral Dr. Estanislado Pandy has declined it.  Per Dr. Estanislado Pandy too complicated. [place].    I have placed an order for Rheumatology to [facility].  Please contact the patient and patient's son to notify that we are having to replace a different referral to [facility] health rheumatology.  Wyn Quaker FNP

## 2018-10-15 ENCOUNTER — Other Ambulatory Visit: Payer: Self-pay | Admitting: *Deleted

## 2018-10-15 NOTE — Patient Outreach (Addendum)
[facility]) Care Management  10/15/2018  Julia Johns 06-12-1950 [id number]   Telephone assessment Julia Johns, 68 y/o femalecurrently active with [place] after recent hospitalizationJune 12-18, 2020 for acute renal failure;vasculitispatient was discharged home to self-care with home health services in place. Patientexperienced hospital re-admission on June 29- July 22, 2018 for hemoptysis, acute cystitis with hematuria, and hypotension.  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  Subjective: Successful outreach call to patient daughter in law Julia Johns verified, she verifies being at home and patient  available for outreach.  Julia Johns discussed patient has been doing good, she discussed patient did not require ritacrit injection on this week due to hemoglobin being at goal of 12 , she has appointment in next week for repeat lab and treatment if needed at outpatient center at [facility] call to patient with use of [employer] interpreter, [place] .  Patient reports that she continues to improve get better.  She discussed recent visit to doctor: Right knee pain: She discussed recent doctor visit and getting cortisone injection in her knee and reports that she can tell a difference. Discussed fall prevention and reinforced continuing to use walker for safety she reports understanding and will continue to use.  Diabetes  Patient reports that her son continues to help her with checking her blood sugar, she reports that I has been running higher since receiving cortisone injection as doctor told her it would, but it sugar would return to usual range she states prior to that it was in the 119, 108, less than 200, but she has had readings up to 280. She denies low blood sugar episodes.  She reports continuing to work on watching portions  on rice that she loves  and eating more vegetables and along with something like chicken,  Chronic Kidney Disease/Vasculitis  Patient reports continuing to weigh on most morning sometimes she does not wake to weigh before her son goes to work, her weight in is staying in 178 to 180 pounds. She reports swelling in legs has decreased. Discussed with patient referrals MD has placed regarding her condition and  rash to lower legs as patient reports has improved ( Rheumatology at [facility] and local Dermatologist)  She agrees and states her son Julia Johns will help with getting to appointments.  Shortness of breath  Patient reports improvement in breathing since wearing oxygen  no headache , no cough. She reports completing overnight oxygen on study, on this week.   Patient denies any new concerns she states she believes that she will continue to get stronger.  Julia Johns states patient follow up visit with Dr. Tobie Poet on 10/1 and 9/30 visit at [facility] for hemoglobin check and treatment if needed.  Discussed with Julia Johns and patient  plan for transition to [place] team , in next month for continuing education and support on chronic conditions.    Plan  Will plan follow up call to patient son, in the next 2 weeks for transition to [place] team.  Follow up on speciality appointments.    THN CM Care Plan Problem One     Most Recent Value  Care Plan Problem One  High risk for readmission related to Kidney and lung health conditons   Role Documenting the Problem One  Care Management [place] for Problem One  Active  [facility] Long Term Goal  Patient will not experience and admission over the next 31 days   THN Long Term Goal Start Date  09/24/18 Julia Johns plan reinitiated ]  Interventions for Problem One Long Term Goal  Discussed current clinical state, reviewed progress positive reinforcment on progress,. Reviewed addtional follow up with specialiist   THN CM Short Term Goal #1    Over the next 30 days patient/family will be able to identify at least 2 worsening symptoms of chronic condiition , Chronic kidney conditio.  THN CM Short Term Goal #1 Start Date  09/24/18  Interventions for Short Term Goal #1  Reinforced notifying MD of worsening symptoms of shortness of breath swelling ,   THN CM Short Term Goal #2   Over the next 30 days patient will continue to weigh daily and keep a record .   THN CM Short Term Goal #2 Start Date  09/24/18  Interventions for Short Term Goal #2  Reviewed recent daily weights, and reinforced importance of monitoring and notifying MD of sudden weight gain, of 3 pounds in a day 5 in a week.     [facility] CM Care Plan Problem Two     Most Recent Value  Care Plan Problem Two  Knowledge deficit related to new diagnosis of Diabetes   Role Documenting the Problem Two  Care Management Coordinator  Care Plan for Problem Two  Active  THN Long Term Goal  Over the next 90 days patient will be able to identify at 2  least measures of self care managment of Diabetes    THN Long Term Goal Start Date  07/10/18  [facility] Long Term Goal Met Date  10/15/18  THN CM Short Term Goal #1   Over the next 30 days patient/son will be able to reports monitioring blood sugar 2 times daily and keeping a record.    THN CM Short Term Goal #1 Start Date  07/28/18  [facility] CM Short Term Goal #1 Met Date   08/27/18  THN CM Short Term Goal #2   Patient/son will be able to verbalize 2 symptoms of low blood sugar over the next 30 days   THN CM Short Term Goal #2 Start Date  07/28/18  [facility] CM Short Term Goal #2 Met Date  08/19/18  THN CM Short Term Goal #3   Over the next 30 days patient will be able to report meal planning using balanced meal plan .    THN CM Short Term Goal #3 Start Date  09/10/18  THN CM Short Term Goal #3 Met Date  10/15/18  THN CM Short Term Goal #4  Patient/caregiver will be able to report wound healing at thigh site over the next 30 days as evidenced by report  THN CM  Short Term Goal #4 Start Date  10/08/18 Barrie Folk restart ]  Interventions of Short Term Goal #4  Discussed keeping skin clean and dry, reviewed plan for dermatatology follow up       [name], RN, [place] Management Coordinator  [phone]- Mobile [phone]- [place]

## 2018-10-19 ENCOUNTER — Telehealth: Payer: Self-pay | Admitting: Pulmonary Disease

## 2018-10-19 DIAGNOSIS — G4734 Idiopathic sleep related nonobstructive alveolar hypoventilation: Secondary | ICD-10-CM

## 2018-10-19 NOTE — Telephone Encounter (Signed)
10/19/2018 1001  We received patient's overnight oximetry results.  10/09/2018-overnight oximetry- duration of sleep 7 hours and 34 minutes, on room air, SPO2 less than 88% 1 hour and 34 minutes and 28 seconds.  I would recommend the patient start 2 L of O2 at night  Triage, Please contact the patient and notify them of these results.  I believe the patient already has a home concentrator where they could coordinate this.  Please place order if needed for DME company for patient be maintained on 2 L of O2 at night.  Patient should also have a follow-up appointment with Dr. [name] coming up where this can be further evaluated and discussed.  Wyn Quaker, FNP

## 2018-10-19 NOTE — Telephone Encounter (Signed)
Spoke with patient's son, [name]. He was made aware of the results. He stated that she has been using the O2 at night but will make sure that she continues to her O2 at night at 2L. Advised him that I would go ahead and place the order for [employer] so that insurance will continue to pay for it, he verbalized understanding.   Nothing further needed at time of call.

## 2018-10-21 DIAGNOSIS — N179 Acute kidney failure, unspecified: Secondary | ICD-10-CM | POA: Diagnosis not present

## 2018-10-21 DIAGNOSIS — D631 Anemia in chronic kidney disease: Secondary | ICD-10-CM | POA: Diagnosis not present

## 2018-10-21 DIAGNOSIS — N189 Chronic kidney disease, unspecified: Secondary | ICD-10-CM | POA: Diagnosis not present

## 2018-10-22 DIAGNOSIS — M1711 Unilateral primary osteoarthritis, right knee: Secondary | ICD-10-CM | POA: Diagnosis not present

## 2018-10-26 DIAGNOSIS — R918 Other nonspecific abnormal finding of lung field: Secondary | ICD-10-CM | POA: Diagnosis not present

## 2018-10-27 DIAGNOSIS — R918 Other nonspecific abnormal finding of lung field: Secondary | ICD-10-CM | POA: Diagnosis not present

## 2018-10-27 DIAGNOSIS — M159 Polyosteoarthritis, unspecified: Secondary | ICD-10-CM | POA: Insufficient documentation

## 2018-10-27 DIAGNOSIS — Z8701 Personal history of pneumonia (recurrent): Secondary | ICD-10-CM | POA: Diagnosis not present

## 2018-10-27 DIAGNOSIS — Z9981 Dependence on supplemental oxygen: Secondary | ICD-10-CM | POA: Diagnosis not present

## 2018-10-27 DIAGNOSIS — M3131 Wegener's granulomatosis with renal involvement: Secondary | ICD-10-CM | POA: Diagnosis not present

## 2018-10-27 DIAGNOSIS — Z79899 Other long term (current) drug therapy: Secondary | ICD-10-CM | POA: Insufficient documentation

## 2018-10-27 DIAGNOSIS — M170 Bilateral primary osteoarthritis of knee: Secondary | ICD-10-CM | POA: Diagnosis not present

## 2018-10-27 DIAGNOSIS — M8949 Other hypertrophic osteoarthropathy, multiple sites: Secondary | ICD-10-CM | POA: Diagnosis not present

## 2018-10-27 DIAGNOSIS — M545 Low back pain: Secondary | ICD-10-CM | POA: Diagnosis not present

## 2018-10-29 DIAGNOSIS — R918 Other nonspecific abnormal finding of lung field: Secondary | ICD-10-CM | POA: Diagnosis not present

## 2018-10-30 DIAGNOSIS — Z23 Encounter for immunization: Secondary | ICD-10-CM | POA: Diagnosis not present

## 2018-10-30 DIAGNOSIS — N390 Urinary tract infection, site not specified: Secondary | ICD-10-CM | POA: Diagnosis not present

## 2018-10-31 DIAGNOSIS — L89221 Pressure ulcer of left hip, stage 1: Secondary | ICD-10-CM | POA: Diagnosis not present

## 2018-10-31 DIAGNOSIS — L89213 Pressure ulcer of right hip, stage 3: Secondary | ICD-10-CM | POA: Diagnosis not present

## 2018-10-31 DIAGNOSIS — J9621 Acute and chronic respiratory failure with hypoxia: Secondary | ICD-10-CM | POA: Diagnosis not present

## 2018-10-31 DIAGNOSIS — I776 Arteritis, unspecified: Secondary | ICD-10-CM | POA: Diagnosis not present

## 2018-11-04 DIAGNOSIS — D631 Anemia in chronic kidney disease: Secondary | ICD-10-CM | POA: Diagnosis not present

## 2018-11-04 DIAGNOSIS — I776 Arteritis, unspecified: Secondary | ICD-10-CM | POA: Diagnosis not present

## 2018-11-04 DIAGNOSIS — N189 Chronic kidney disease, unspecified: Secondary | ICD-10-CM | POA: Diagnosis not present

## 2018-11-04 DIAGNOSIS — E1122 Type 2 diabetes mellitus with diabetic chronic kidney disease: Secondary | ICD-10-CM | POA: Diagnosis not present

## 2018-11-05 DIAGNOSIS — N390 Urinary tract infection, site not specified: Secondary | ICD-10-CM | POA: Diagnosis not present

## 2018-11-11 ENCOUNTER — Other Ambulatory Visit: Payer: Self-pay

## 2018-11-11 NOTE — Patient Outreach (Signed)
[facility]) Care Management  11/11/2018  Julia Johns 03/18/1950 [id number]   Telephone Assessment   Outreach attempt #1 to patient. Spoke with [name](son-DPR on file). He is currently at work but states that patient is doing very well and has improved a lot since last month. He is pleased to report that she is "more stronger and more active." Son voices that patient has been going to sit and walk around outside some with family assistance. He voices that her appetite is getting better. He states that cbgs are WNL for patient but unable to recall most recent reading. She is using the oxygen as ordered and has been having less SOB episodes. Caregiver voices that patient goes to see pulmonologist later this week. She recently saw rheumatologist and per son they are working on a plan and getting patient's old records. Son voices that patient received flu vaccine about 2wks ago at PCP office. She is currently taking an antibiotic(unable to recall name) for UTI that was discovered last week. RN CM instructed son on s/s of worsening condition and ways to manage/treat UTI non-pharmacologically. He voiced understanding. He denies any further RN CM needs or concerns at this time.      Plan: RN CM will make outreach attempt to patient/caregiver in a month.   Enzo Montgomery, RN,BSN,CCM [place] Management Telephonic Care Management Coordinator Direct Phone: [phone] Toll Free: [phone] Fax: [phone]

## 2018-11-12 ENCOUNTER — Ambulatory Visit: Payer: Self-pay

## 2018-11-13 ENCOUNTER — Other Ambulatory Visit: Payer: Self-pay

## 2018-11-13 ENCOUNTER — Ambulatory Visit (INDEPENDENT_AMBULATORY_CARE_PROVIDER_SITE_OTHER): Payer: Medicare HMO | Admitting: Pulmonary Disease

## 2018-11-13 ENCOUNTER — Encounter: Payer: Self-pay | Admitting: Pulmonary Disease

## 2018-11-13 ENCOUNTER — Ambulatory Visit: Payer: Medicare HMO | Admitting: Pulmonary Disease

## 2018-11-13 DIAGNOSIS — J9611 Chronic respiratory failure with hypoxia: Secondary | ICD-10-CM

## 2018-11-13 DIAGNOSIS — I776 Arteritis, unspecified: Secondary | ICD-10-CM

## 2018-11-13 DIAGNOSIS — I7782 Antineutrophilic cytoplasmic antibody (ANCA) vasculitis: Secondary | ICD-10-CM

## 2018-11-13 NOTE — Patient Instructions (Signed)
Okay to stay off oxygen during rest and only use it when level drops less than 88%. Continue to use oxygen during sleep

## 2018-11-13 NOTE — Progress Notes (Signed)
   Subjective:    Patient ID: Julia Johns, female    DOB: February 19, 1950, 68 y.o.   MRN: [id number]  HPI  68 year old never smoker presented with hemoptysis and abnormal CT showing lung mass in May/2020.  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 (Dr. Posey Pronto at [name] )  She received 4 weeks of [name] and now is on a every 66-month schedule.  Next due [name] infusion in January 2021.  She did not tolerate prednisone and this was tapered off  PMH: ANCA vasculitis, chronic kidney disease stage IV, history of hemoptysis, GERD, obesity  On O2 - ambulatory satn 10/02/18 OV reviewed , she desaturated and was asked to continue on 2 L oxygen. She feels much improved has been able to stay off oxygen for short.'s.  She still sleeps with oxygen at night.  No cough or hemoptysis.  No joint issues. She has skin boils that are propped up on her arms Potassium was low and this is being supplemented, she "complains of occasional cramps in her hands.  Accompanied by son [name] today who translates   Significant tests/ events reviewed  09/23/2018-CT chest without contrast-intralobular septal thickening in the upper lungs bilaterally with bilateral diffuse groundglass attenuation demonstrating slight sparing of lower lungs, imaging features nonspecific and edema infection inflammatory disease and hemorrhage all can have this appearance, focal area of consolidative opacity in the left lower lobe improved when compared to 06/16/2018, mosaic attenuation in the lungs bilaterally, nonspecific but likely reflecting air trapping, prominence of main pulmonary arteries suggest pulmonary arterial hypertension   10/09/2018-overnight oximetry- duration of sleep 7 hours and 34 minutes, on room air, SPO2 less than 88% 1 hour and 34 minutes and 28 seconds.  06/15/2018-Cytoplasmic C ANCA-1: 80 07/05/2018- ANCA  proteinase- 23.2  Review of Systems neg for any significant sore throat, dysphagia, itching, sneezing, nasal congestion or excess/ purulent secretions, fever, chills, sweats, unintended wt loss, pleuritic or exertional cp, hempoptysis, orthopnea pnd or change in chronic leg swelling. Also denies presyncope, palpitations, heartburn, abdominal pain, nausea, vomiting, diarrhea or change in bowel or urinary habits, dysuria,hematuria, rash, arthralgias, visual complaints, headache, numbness weakness or ataxia.     Objective:   Physical Exam  Gen. Pleasant, obese, in no distress ENT - no lesions, no post nasal drip Neck: No JVD, no thyromegaly, no carotid bruits Lungs: no use of accessory muscles, no dullness to percussion, decreased without rales or rhonchi  Cardiovascular: Rhythm regular, heart sounds  normal, no murmurs or gallops, no peripheral edema Musculoskeletal: No deformities, no cyanosis or clubbing , no tremors       Assessment & Plan:

## 2018-11-13 NOTE — Assessment & Plan Note (Addendum)
She still has some pulmonary disease based on the last CT, not much visible on chest x-ray [name] dosing per renal, next planned for December We will monitor her symptomatically -repeat imaging in 6 months She appears to be in remission-no cough or hemoptysis

## 2018-11-13 NOTE — Assessment & Plan Note (Signed)
Okay to stay off oxygen during rest and only use it when level drops less than 88%. Continue to use oxygen during sleep

## 2018-11-26 DIAGNOSIS — R918 Other nonspecific abnormal finding of lung field: Secondary | ICD-10-CM | POA: Diagnosis not present

## 2018-12-01 DIAGNOSIS — J9621 Acute and chronic respiratory failure with hypoxia: Secondary | ICD-10-CM | POA: Diagnosis not present

## 2018-12-01 DIAGNOSIS — L89221 Pressure ulcer of left hip, stage 1: Secondary | ICD-10-CM | POA: Diagnosis not present

## 2018-12-01 DIAGNOSIS — I776 Arteritis, unspecified: Secondary | ICD-10-CM | POA: Diagnosis not present

## 2018-12-01 DIAGNOSIS — L89213 Pressure ulcer of right hip, stage 3: Secondary | ICD-10-CM | POA: Diagnosis not present

## 2018-12-09 ENCOUNTER — Other Ambulatory Visit: Payer: Self-pay

## 2018-12-09 NOTE — Patient Outreach (Signed)
[facility]) Care Management  12/09/2018  Julia Johns [birth year] [id number]   Telephone Assessment   Incoming call from patient's son([name]) returning RN CM call. He is pleased to report that patient is doing well and continues to get better. He voices that she is only using oxygen when she needs it and not all the time. He state that she has not hd to use it lately. She went to see lung MD a few weeks ago and got a good report at appt. Son voices that patient has appt with Kidney Md on next week and he will be taking her. He voices that cbgs are WNL and denies nay abnormal values. He reports that patient has "changed her diet" and eating better. Patient has reported that she feels like she is "no longer diabetic" since every time she checks her blood sugars they are normal. RN CM discussed with caregiver upcoming holidays and importance of adhering to diet restrictions during this time. He voiced understanding. He denies any RN CM needs or concerns at this time.   THN CM Care Plan Problem One     Most Recent Value  Care Plan Problem One  High risk for readmission related to Kidney and lung health conditons   Role Documenting the Problem One  Care Management Coordinator  Care Plan for Problem One  Active    [facility] CM Care Plan Problem Two     Most Recent Value  Care Plan Problem Two  Knowledge deficit related to new diagnosis of Diabetes   Role Documenting the Problem Two  Care Management [place] for Problem Two  Active  Interventions for Problem Two Long Term Goal   RN CM assessed for cng monitoring in the home and reviewed values. RN CM reinforced importance of adherence to diet restrictions.   THN Long Term Goal  Patient/family will report an A1C level of 7.5 or less over the next 90 days.  THN Long Term Goal Start Date  11/11/18    [facility] CM Care Plan Problem Three     Most Recent Value  Care Plan Problem Three  Patient with current UTI.   Role Documenting the Problem Three  Care Management Telephonic Coordinator  Care Plan for Problem Three  Active  THN CM Short Term Goal #1   Patient/caregiver  will report completion of antibiotic therapy for UTIover the next 30 days.  THN CM Short Term Goal #1 Start Date  11/11/18  THN CM Short Term Goal #1 Met Date  12/09/18  THN CM Short Term Goal #2   Patient/family will reprot resolution of UTI over the next 30 days.  THN CM Short Term Goal #2 Start Date  11/11/18  [facility] CM Short Term Goal #2 Met Date  12/09/18       Plan: RN CM discussed with caregiver next outreach within the month of January. He gave verbal consent and in agreement with RN CM follow up timeframe. He is aware that they may contact RN CM sooner for any issues or concerns.  Enzo Montgomery, RN,BSN,CCM [place] Management Telephonic Care Management Coordinator Direct Phone: [phone] Toll Free: [phone] Fax: [phone]

## 2018-12-09 NOTE — Patient Outreach (Signed)
[facility]) Care Management  12/09/2018  Julia Johns January 22, 1950 [id number]   Telephone Assessment    Outreach attempt #1 to patient/caregiver. No answer at present and voicemail not set up.       Plan: RN CM will make outreach attempt to patient/caregiver within 3-4 business days.   Enzo Montgomery, RN,BSN,CCM [place] Management Telephonic Care Management Coordinator Direct Phone: [phone] Toll Free: [phone] Fax: [phone]

## 2018-12-10 ENCOUNTER — Ambulatory Visit: Payer: Self-pay

## 2018-12-14 DIAGNOSIS — I776 Arteritis, unspecified: Secondary | ICD-10-CM | POA: Diagnosis not present

## 2018-12-14 DIAGNOSIS — I10 Essential (primary) hypertension: Secondary | ICD-10-CM | POA: Diagnosis not present

## 2018-12-24 DIAGNOSIS — H04123 Dry eye syndrome of bilateral lacrimal glands: Secondary | ICD-10-CM | POA: Diagnosis not present

## 2018-12-24 DIAGNOSIS — E119 Type 2 diabetes mellitus without complications: Secondary | ICD-10-CM | POA: Diagnosis not present

## 2018-12-24 DIAGNOSIS — H25813 Combined forms of age-related cataract, bilateral: Secondary | ICD-10-CM | POA: Diagnosis not present

## 2018-12-24 DIAGNOSIS — H33322 Round hole, left eye: Secondary | ICD-10-CM | POA: Diagnosis not present

## 2018-12-26 DIAGNOSIS — R918 Other nonspecific abnormal finding of lung field: Secondary | ICD-10-CM | POA: Diagnosis not present

## 2018-12-27 ENCOUNTER — Encounter (HOSPITAL_COMMUNITY): Payer: Self-pay | Admitting: Emergency Medicine

## 2018-12-27 ENCOUNTER — Other Ambulatory Visit: Payer: Self-pay

## 2018-12-27 ENCOUNTER — Emergency Department (HOSPITAL_COMMUNITY): Payer: Medicare HMO

## 2018-12-27 ENCOUNTER — Emergency Department (HOSPITAL_COMMUNITY)
Admission: EM | Admit: 2018-12-27 | Discharge: 2018-12-27 | Disposition: A | Discharge status: Home / Self Care | Payer: Medicare HMO | Attending: Emergency Medicine | Admitting: Emergency Medicine

## 2018-12-27 DIAGNOSIS — K429 Umbilical hernia without obstruction or gangrene: Secondary | ICD-10-CM | POA: Diagnosis not present

## 2018-12-27 DIAGNOSIS — Z7984 Long term (current) use of oral hypoglycemic drugs: Secondary | ICD-10-CM | POA: Diagnosis not present

## 2018-12-27 DIAGNOSIS — K5732 Diverticulitis of large intestine without perforation or abscess without bleeding: Secondary | ICD-10-CM | POA: Insufficient documentation

## 2018-12-27 DIAGNOSIS — N189 Chronic kidney disease, unspecified: Secondary | ICD-10-CM | POA: Diagnosis not present

## 2018-12-27 DIAGNOSIS — Z03818 Encounter for observation for suspected exposure to other biological agents ruled out: Secondary | ICD-10-CM | POA: Diagnosis not present

## 2018-12-27 DIAGNOSIS — D259 Leiomyoma of uterus, unspecified: Secondary | ICD-10-CM | POA: Diagnosis not present

## 2018-12-27 DIAGNOSIS — E1122 Type 2 diabetes mellitus with diabetic chronic kidney disease: Secondary | ICD-10-CM | POA: Diagnosis not present

## 2018-12-27 DIAGNOSIS — R1031 Right lower quadrant pain: Secondary | ICD-10-CM | POA: Diagnosis present

## 2018-12-27 DIAGNOSIS — K5792 Diverticulitis of intestine, part unspecified, without perforation or abscess without bleeding: Secondary | ICD-10-CM | POA: Diagnosis not present

## 2018-12-27 DIAGNOSIS — Z20828 Contact with and (suspected) exposure to other viral communicable diseases: Secondary | ICD-10-CM | POA: Diagnosis not present

## 2018-12-27 DIAGNOSIS — Z79899 Other long term (current) drug therapy: Secondary | ICD-10-CM | POA: Diagnosis not present

## 2018-12-27 LAB — URINALYSIS, ROUTINE W REFLEX MICROSCOPIC
Bacteria, UA: NONE SEEN
Bilirubin Urine: NEGATIVE
Glucose, UA: NEGATIVE mg/dL
Hgb urine dipstick: NEGATIVE
Ketones, ur: NEGATIVE mg/dL
Nitrite: NEGATIVE
Protein, ur: 100 mg/dL — AB
Specific Gravity, Urine: 1.016 (ref 1.005–1.030)
pH: 6 (ref 5.0–8.0)

## 2018-12-27 LAB — CBC WITH DIFFERENTIAL/PLATELET
Abs Immature Granulocytes: 0.08 10*3/uL — ABNORMAL HIGH (ref 0.00–0.07)
Basophils Absolute: 0.1 10*3/uL (ref 0.0–0.1)
Basophils Relative: 1 %
Eosinophils Absolute: 0.2 10*3/uL (ref 0.0–0.5)
Eosinophils Relative: 1 %
HCT: 40.9 % (ref 36.0–46.0)
Hemoglobin: 13.3 g/dL (ref 12.0–15.0)
Immature Granulocytes: 1 %
Lymphocytes Relative: 37 %
Lymphs Abs: 5.2 10*3/uL — ABNORMAL HIGH (ref 0.7–4.0)
MCH: 28.8 pg (ref 26.0–34.0)
MCHC: 32.5 g/dL (ref 30.0–36.0)
MCV: 88.5 fL (ref 80.0–100.0)
Monocytes Absolute: 0.5 10*3/uL (ref 0.1–1.0)
Monocytes Relative: 4 %
Neutro Abs: 8.2 10*3/uL — ABNORMAL HIGH (ref 1.7–7.7)
Neutrophils Relative %: 56 %
Platelets: 441 10*3/uL — ABNORMAL HIGH (ref 150–400)
RBC: 4.62 MIL/uL (ref 3.87–5.11)
RDW: 15.6 % — ABNORMAL HIGH (ref 11.5–15.5)
WBC: 14.3 10*3/uL — ABNORMAL HIGH (ref 4.0–10.5)
nRBC: 0 % (ref 0.0–0.2)

## 2018-12-27 LAB — COMPREHENSIVE METABOLIC PANEL
ALT: 25 U/L (ref 0–44)
AST: 23 U/L (ref 15–41)
Albumin: 3.6 g/dL (ref 3.5–5.0)
Alkaline Phosphatase: 103 U/L (ref 38–126)
Anion gap: 10 (ref 5–15)
BUN: 17 mg/dL (ref 8–23)
CO2: 25 mmol/L (ref 22–32)
Calcium: 9.1 mg/dL (ref 8.9–10.3)
Chloride: 100 mmol/L (ref 98–111)
Creatinine, Ser: 0.92 mg/dL (ref 0.44–1.00)
GFR calc Af Amer: >60 mL/min (ref >60)
GFR calc non Af Amer: >60 mL/min (ref >60)
Glucose, Bld: 193 mg/dL — ABNORMAL HIGH (ref 70–99)
Potassium: 3.5 mmol/L (ref 3.5–5.1)
Sodium: 135 mmol/L (ref 135–145)
Total Bilirubin: 0.3 mg/dL (ref 0.3–1.2)
Total Protein: 6.8 g/dL (ref 6.5–8.1)

## 2018-12-27 MED ORDER — METRONIDAZOLE 500 MG PO TABS: 500.0000 mg | ORAL_TABLET | Freq: Three times a day (TID) | ORAL | 0 refills | Status: AC

## 2018-12-27 MED ORDER — OXYCODONE-ACETAMINOPHEN 5-325 MG PO TABS
1.0000 | ORAL_TABLET | Freq: Once | ORAL | Status: AC
  Administered 2018-12-27: 1 via ORAL
  Filled 2018-12-27: qty 1

## 2018-12-27 MED ORDER — ONDANSETRON HCL 4 MG/2ML IJ SOLN
4.0000 mg | Freq: Once | INTRAMUSCULAR | Status: AC
  Administered 2018-12-27: 4 mg via INTRAVENOUS
  Filled 2018-12-27: qty 2

## 2018-12-27 MED ORDER — HYDROCODONE-ACETAMINOPHEN 5-325 MG PO TABS
1.0000 | ORAL_TABLET | Freq: Once | ORAL | Status: AC
  Administered 2018-12-27: 1 via ORAL
  Filled 2018-12-27: qty 1

## 2018-12-27 MED ORDER — FENTANYL CITRATE (PF) 100 MCG/2ML IJ SOLN
50.0000 ug | Freq: Once | INTRAMUSCULAR | Status: AC
  Administered 2018-12-27: 50 ug via INTRAVENOUS
  Filled 2018-12-27: qty 2

## 2018-12-27 MED ORDER — HYDROCODONE-ACETAMINOPHEN 5-325 MG PO TABS: 1.0000 | ORAL_TABLET | Freq: Four times a day (QID) | ORAL | 0 refills | Status: DC | PRN

## 2018-12-27 MED ORDER — CIPROFLOXACIN HCL 500 MG PO TABS: 500.0000 mg | ORAL_TABLET | Freq: Two times a day (BID) | ORAL | 0 refills | Status: AC

## 2018-12-27 NOTE — ED Provider Notes (Addendum)
[facility] EMERGENCY DEPARTMENT Provider Note   CSN: [id number] Arrival date & time: 12/27/18  0441     History   Chief Complaint Chief Complaint  Patient presents with  . Flank Pain    HPI Julia Johns is a 68 y.o. female with a past medical history of CKD, and IDDM, presenting to the ED with a chief complaint of right-sided flank pain.  3 days ago began having dysuria.  For the past 12 hours been having gradual onset of intermittent right-sided flank pain beginning in her back and radiating to her side.  She denies history of similar symptoms in the past.  She has not tried any medications to help with her symptoms.  She states she has a distant history of kidney stones.  Reports nausea but denies any vomiting.  Denies any changes to bowel movements (son reports history of constipation at baseline for which she takes fiber for), fever, prior abdominal surgeries, hematuria, shortness of breath, cough, chest pain, sick contacts with similar symptoms. Son does not history of recurrent UTIs, with most recent treatment being about 3 weeks ago.  He is unsure of antibiotic.  Patient was also recently switched from Lasix to chlorthalidone.     The history is provided by the patient and a relative. A language interpreter was used Product manager medical interpreter service but patient declined; son at bedside was used as Astronomer).  Flank Pain Pertinent negatives include no chest pain, no abdominal pain and no shortness of breath.    Past Medical History:  Diagnosis Date  . Chronic kidney disease   . Diabetes mellitus without complication [facility])     Patient Active Problem List   Diagnosis Date Noted  . Abnormal findings on diagnostic imaging of lung 09/25/2018  . Rash 09/25/2018  . 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 Dysphagia 09/25/2018  . Heart burn 08/24/2018  . Acute lower UTI 07/21/2018  . Hypotension 07/21/2018  . CKD (chronic kidney  disease) 07/21/2018  . Acute renal failure (ARF) ([place]) 07/03/2018  . Hematuria 07/03/2018  . Yeast infection involving the vagina and surrounding area 07/03/2018  . Dysuria 07/03/2018  . Chronic respiratory failure with hypoxia ([place])   . Bilateral lower extremity edema   . Obesity (BMI 30.0-34.9)   . Colitis 06/15/2018  . 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 VIDEO BRONCHOSCOPY Bilateral 06/18/2018   Procedure: VIDEO BRONCHOSCOPY WITH FLUORO;  Surgeon: Rigoberto Noel, MD;  Location: [facility];  Service: Cardiopulmonary;  Laterality: Bilateral;  . VIDEO BRONCHOSCOPY WITH ENDOBRONCHIAL ULTRASOUND N/A 06/25/2018   Procedure: VIDEO BRONCHOSCOPY WITH ENDOBRONCHIAL ULTRASOUND;  Surgeon: Rigoberto Noel, MD;  Location: [facility];  Service: Thoracic;  Laterality: N/A;     OB History   No obstetric history on file.      Home Medications    Prior to Admission medications   Medication Sig Start Date End Date Taking? Authorizing Provider  chlorthalidone (HYGROTON) 25 MG tablet Take 25 mg by mouth daily.   Yes [provider]  glipiZIDE (GLUCOTROL) 5 MG tablet Take 0.5 tablets (2.5 mg total) by mouth 2 (two) times daily before a meal. 07/09/18 07/09/19 Yes Georgette Shell, MD  potassium chloride (K-DUR) 10 MEQ tablet Take 1 tablet (10 mEq total) by mouth daily. 07/09/18  Yes Georgette Shell, MD  thiamine (VITAMIN B-1) 50 MG tablet Take 50 mg by mouth daily.   Yes [provider]  ciprofloxacin (CIPRO) 500 MG tablet Take 1 tablet (500 mg total) by mouth every 12 (twelve) hours for 7 days. 12/27/18 01/03/19  Romina Divirgilio, PA-C  furosemide (LASIX) 80 MG tablet Take 0.5 tablets (40 mg total) by mouth 2 (two) times daily. Patient not taking: Reported on 12/27/2018 07/22/18   Nita Sells, MD  HYDROcodone-acetaminophen (NORCO/VICODIN) 5-325 MG tablet Take 1 tablet by mouth every 6 (six) hours as needed. 12/27/18   Stephenia Vogan,  PA-C  metroNIDAZOLE (FLAGYL) 500 MG tablet Take 1 tablet (500 mg total) by mouth 3 (three) times daily for 7 days. 12/27/18 01/03/19  Delia Heady, PA-C  ondansetron (ZOFRAN-ODT) 4 MG disintegrating tablet  08/04/18   [provider]    Family History No family history on file.  Social History Social History   Tobacco Use  . Smoking status: Never Smoker  . Smokeless tobacco: Never Used  Substance Use Topics  . Alcohol use: Never    Frequency: Never  . Drug use: Never     Allergies   Penicillins   Review of Systems Review of Systems  Constitutional: Negative for appetite change, chills and fever.  HENT: Negative for ear pain, rhinorrhea, sneezing and sore throat.   Eyes: Negative for photophobia and visual disturbance.  Respiratory: Negative for cough, chest tightness, shortness of breath and wheezing.   Cardiovascular: Negative for chest pain and palpitations.  Gastrointestinal: Negative for abdominal pain, blood in stool, constipation, diarrhea, nausea and vomiting.  Genitourinary: Positive for dysuria and flank pain. Negative for hematuria and urgency.  Musculoskeletal: Negative for myalgias.  Skin: Negative for rash.  Neurological: Negative for dizziness, weakness and light-headedness.     Physical Exam Updated Vital Signs BP 114/72 (BP Location: Right Arm)   Pulse 69   Temp 98.6 F (37 C) (Oral)   Resp 18   SpO2 96%   Physical Exam Vitals signs and nursing note reviewed.  Constitutional:      General: She is not in acute distress.    Appearance: She is well-developed. She is obese.     Comments: Appears uncomfortable.  HENT:     Head: Normocephalic and atraumatic.     Nose: Nose normal.  Eyes:     General: No scleral icterus.       Right eye: No discharge.        Left eye: No discharge.     Conjunctiva/sclera: Conjunctivae normal.  Neck:     Musculoskeletal: Normal range of motion and neck supple.  Cardiovascular:     Rate and Rhythm:  Normal rate and regular rhythm.     Heart sounds: Normal heart sounds. No murmur. No friction rub. No gallop.   Pulmonary:     Effort: Pulmonary effort is normal. No respiratory distress.     Breath sounds: Normal breath sounds.  Abdominal:     General: Bowel sounds are normal. There is no distension.     Palpations: Abdomen is soft.     Tenderness: There is no abdominal tenderness. There is right CVA tenderness (and right flank). There is no guarding.  Musculoskeletal: Normal range of motion.  Skin:    General: Skin is warm and dry.     Findings: No rash.  Neurological:     Mental Status: She is alert.     Motor: No abnormal muscle tone.     Coordination: Coordination normal.      ED Treatments / Results  Labs (all labs ordered are listed, but only abnormal results are displayed)  Labs Reviewed  URINALYSIS, ROUTINE W REFLEX MICROSCOPIC - Abnormal; Notable for the following components:      Result Value   Protein, ur 100 (*)    Leukocytes,Ua TRACE (*)    All other components within normal limits  CBC WITH DIFFERENTIAL/PLATELET - Abnormal; Notable for the following components:   WBC 14.3 (*)    RDW 15.6 (*)    Platelets 441 (*)    Neutro Abs 8.2 (*)    Lymphs Abs 5.2 (*)    Abs Immature Granulocytes 0.08 (*)    All other components within normal limits  COMPREHENSIVE METABOLIC PANEL - Abnormal; Notable for the following components:   Glucose, Bld 193 (*)    All other components within normal limits  URINE CULTURE  NOVEL CORONAVIRUS, NAA (HOSP ORDER, SEND-OUT TO REF LAB; TAT 18-24 HRS)    EKG None  Radiology Ct Renal Stone Study  Result Date: 12/27/2018 CLINICAL DATA:  Right pelvic pain, history of nephrolithiasis EXAM: CT ABDOMEN AND PELVIS WITHOUT CONTRAST TECHNIQUE: Multidetector CT imaging of the abdomen and pelvis was performed following the standard protocol without IV contrast. COMPARISON:  06/14/2018 FINDINGS: Lower chest: No pericardial effusion. Patchy  atelectasis/consolidation in the left lung base. Resolution of pleural effusions seen previously. Hepatobiliary: No focal liver abnormality is seen. No gallstones, gallbladder wall thickening, or biliary dilatation. Pancreas: Unremarkable. No pancreatic ductal dilatation or surrounding inflammatory changes. Spleen: Small and nodular, a progressive process over series of scans since 06/14/2018. Adrenals/Urinary Tract: Adrenal glands unremarkable. Kidneys normal in size. No hydronephrosis. No urolithiasis. Urinary bladder incompletely distended. Stomach/Bowel: Stomach is incompletely distended. Small bowel is nondilated, unremarkable Appendix normal The colon is nondilated. Scattered diverticula from descending and sigmoid segments. Mild inflammatory/edematous changes in the adjacent mesentery at the level of the proximal sigmoid segment, without convincing change from 06/14/2018. Vascular/Lymphatic: No significant vascular findings are present. No enlarged abdominal or pelvic lymph nodes. Reproductive: Lobular uterine enlargement with coarse calcifications probably secondary to uterine fibroids. No adnexal mass. Other: No ascites. No free air. Musculoskeletal: Tiny umbilical hernia containing only mesenteric fat. Spondylitic changes throughout the lumbar spine. No fracture or worrisome bone lesion. IMPRESSION: 1. Negative for urolithiasis or hydronephrosis. 2. Mild inflammatory/edematous changes in the adjacent mesentery at the level of the proximal sigmoid segment, without convincing change from 06/14/2018, may represent chronic mild diverticulitis. No evidence for perforation or abscess formation. 3. Interval resolution of pleural effusions seen previously. 4. Patchy atelectasis/consolidation in the left lung base. 5. Fibroid uterus. 6. Tiny umbilical hernia containing only mesenteric fat. Electronically Signed   By: Lucrezia Europe M.D.   On: 12/27/2018 09:05    Procedures Procedures (including critical care time)   Medications Ordered in ED Medications  oxyCODONE-acetaminophen (PERCOCET/ROXICET) 5-325 MG per tablet 1 tablet (1 tablet Oral Given 12/27/18 0514)  ondansetron (ZOFRAN) injection 4 mg (4 mg Intravenous Given 12/27/18 0744)  fentaNYL (SUBLIMAZE) injection 50 mcg (50 mcg Intravenous Given 12/27/18 0804)  HYDROcodone-acetaminophen (NORCO/VICODIN) 5-325 MG per tablet 1 tablet (1 tablet Oral Given 12/27/18 1031)     Initial Impression / Assessment and Plan / ED Course  I have reviewed the triage vital signs and the nursing notes.  Pertinent labs & imaging results that were available during my care of the patient were reviewed by me and considered in my medical decision making (see chart for details).  Clinical Course as of Dec 27 1030  Nancy Fetter Dec 27, 2018  0951 Patient was seen by myself as well as PA provider.  Briefly 68 year old female presented to ED with abdominal pain.  The pain is located mostly in the right lower side but also appears to wrap around to her right flank.  She is uncomfortable my exam but has no focal tenderness guarding or rebound.  CT scan does not show sign of a kidney stone.  She has no fever suggestive of pyelonephritis.  There is evidence of possible diverticulitis or colitis in her scan, combined with a leukocytosis, believe it is reasonable to treat with antibiotics and give her some pain medicine.  Patient and her son are in agreement.   [MT]    Clinical Course User Index [MT] Trifan, Carola Rhine, MD       68 year old female with a past medical history of diabetes presents to ED with a chief complaint of right flank pain. Reports symptoms began last night; dysuria for the past 3 days. Denies fever, changes to bowel movements.  Work-up significant for leukocytosis of 14, urinalysis with trace leukocytes work-up significant for leukocytosis of 14, urinalysis with trace leukocytes but negative bacteria.  This was sent for culture.  CT renal stone study shows no evidence of  kidney stone but does show evidence of ?chronic diverticulitis. Due to symptoms, leukocytosis and history will treat with antibiotics and short course of pain medication.  Patient educated on possible worsening constipation with pain medication.  Patient and son agreeable to plan. Requesting outpatient COVID test.  10:32 AM Patient with oxygen saturations 86% on RA. Placed on 2 L of oxygen via nasal cannula.  I went to reevaluate the patient and she is again requesting pain medication.  Son at bedside states that patient has been weaning off of her oxygen requirement for the past several months and now only wears 2 days of oxygen at night.  However he does note that she will sometimes wear it "to make herself more comfortable, like if she's watching TV or feeding the chickens." Patient denies SOB, chest pain, stating that it is because of the pain in her right flank that she is taking shallow breaths. She has supplemental oxygen at home to take as needed. They deny further workup.   Patient is hemodynamically stable, in NAD, and able to ambulate in the ED. Evaluation does not show pathology that would require ongoing emergent intervention or inpatient treatment. I explained the diagnosis to the patient. Pain has been managed and has no complaints prior to discharge. Patient is comfortable with above plan and is stable for discharge at this time. All questions were answered prior to disposition. Strict return precautions for returning to the ED were discussed. Encouraged follow up with PCP.   An After Visit Summary was printed and given to the patient.   Portions of this note were generated with Lobbyist. Dictation errors may occur despite best attempts at proofreading.   Final Clinical Impressions(s) / ED Diagnoses   Final diagnoses:  Diverticulitis    ED Discharge Orders         Ordered    HYDROcodone-acetaminophen (NORCO/VICODIN) 5-325 MG tablet  Every 6 hours PRN      12/27/18 0951    ciprofloxacin (CIPRO) 500 MG tablet  Every 12 hours     12/27/18 0951    metroNIDAZOLE (FLAGYL) 500 MG tablet  3 times daily     12/27/18 0951             Delia Heady, PA-C 12/27/18 1033    Wyvonnia Dusky, MD 12/27/18 2149

## 2018-12-27 NOTE — Discharge Instructions (Signed)
Take the antibiotics as directed.  Please complete the entire course of the antibiotics regardless of symptom improvement to prevent worsening or recurrence of your infection. Take the pain medication as needed. The pain medicine may worsen constipation so be sure to take your fiber.   Follow-up with your primary care provider. Return to the ED if you start to experience worsening symptoms, develop a fever, chest pain, shortness of breath, injuries or falls.   Tome los antibiticos segn las indicaciones. Complete todo el ciclo de antibiticos independientemente de la mejora de los sntomas para evitar el empeoramiento o la recurrencia de su infeccin. Tome los analgsicos segn sea necesario. El analgsico puede [employer] estreimiento, as que asegrese de Product/process development scientist. Haga un seguimiento con su proveedor de Midwife. Regrese al servicio de urgencias si comienza a experimentar un empeoramiento de los sntomas, presenta fiebre, dolor en el pecho, dificultad para respirar, lesiones o cadas.

## 2018-12-27 NOTE — ED Triage Notes (Signed)
Patient reports right flank pain onset this morning with dysuria , no hematuria or fever.

## 2018-12-27 NOTE — ED Notes (Signed)
Patient transported to CT 

## 2018-12-28 LAB — URINE CULTURE

## 2018-12-28 LAB — NOVEL CORONAVIRUS, NAA (HOSP ORDER, SEND-OUT TO REF LAB; TAT 18-24 HRS): SARS-CoV-2, NAA: NOT DETECTED

## 2018-12-31 DIAGNOSIS — I776 Arteritis, unspecified: Secondary | ICD-10-CM | POA: Diagnosis not present

## 2018-12-31 DIAGNOSIS — L89221 Pressure ulcer of left hip, stage 1: Secondary | ICD-10-CM | POA: Diagnosis not present

## 2018-12-31 DIAGNOSIS — L89213 Pressure ulcer of right hip, stage 3: Secondary | ICD-10-CM | POA: Diagnosis not present

## 2018-12-31 DIAGNOSIS — J9621 Acute and chronic respiratory failure with hypoxia: Secondary | ICD-10-CM | POA: Diagnosis not present

## 2019-01-01 DIAGNOSIS — Z881 Allergy status to other antibiotic agents status: Secondary | ICD-10-CM | POA: Diagnosis not present

## 2019-01-01 DIAGNOSIS — M3131 Wegener's granulomatosis with renal involvement: Secondary | ICD-10-CM | POA: Diagnosis not present

## 2019-01-01 DIAGNOSIS — Z79899 Other long term (current) drug therapy: Secondary | ICD-10-CM | POA: Diagnosis not present

## 2019-01-01 DIAGNOSIS — M3130 Wegener's granulomatosis without renal involvement: Secondary | ICD-10-CM | POA: Diagnosis not present

## 2019-01-01 DIAGNOSIS — M545 Low back pain: Secondary | ICD-10-CM | POA: Diagnosis not present

## 2019-01-01 DIAGNOSIS — M170 Bilateral primary osteoarthritis of knee: Secondary | ICD-10-CM | POA: Diagnosis not present

## 2019-01-13 DIAGNOSIS — E782 Mixed hyperlipidemia: Secondary | ICD-10-CM | POA: Diagnosis not present

## 2019-01-13 DIAGNOSIS — K5792 Diverticulitis of intestine, part unspecified, without perforation or abscess without bleeding: Secondary | ICD-10-CM | POA: Diagnosis not present

## 2019-01-13 DIAGNOSIS — R5383 Other fatigue: Secondary | ICD-10-CM | POA: Diagnosis not present

## 2019-01-13 DIAGNOSIS — D508 Other iron deficiency anemias: Secondary | ICD-10-CM | POA: Diagnosis not present

## 2019-01-13 DIAGNOSIS — I776 Arteritis, unspecified: Secondary | ICD-10-CM | POA: Diagnosis not present

## 2019-01-13 DIAGNOSIS — E1169 Type 2 diabetes mellitus with other specified complication: Secondary | ICD-10-CM | POA: Diagnosis not present

## 2019-01-13 DIAGNOSIS — E1142 Type 2 diabetes mellitus with diabetic polyneuropathy: Secondary | ICD-10-CM | POA: Diagnosis not present

## 2019-01-26 ENCOUNTER — Other Ambulatory Visit (HOSPITAL_COMMUNITY): Payer: Self-pay | Admitting: *Deleted

## 2019-01-26 ENCOUNTER — Other Ambulatory Visit: Payer: Self-pay

## 2019-01-26 DIAGNOSIS — R918 Other nonspecific abnormal finding of lung field: Secondary | ICD-10-CM | POA: Diagnosis not present

## 2019-01-26 NOTE — Patient Outreach (Signed)
[facility]) Care Management  01/26/2019  Julia Johns [birth year] [id number]   Telephone Assessment   Outreach attempt #1 to patient. Spoke with son who denies any acute issues or concerns at present. He states that patient is doing well. He shares that patient had to go to ED a few weeks ago due to "infection in stomach." She was treated with antibiotics and has been doing well and back to normal since then. He states that cbgs are WNL for patient and appetite remains good. He denies any recent falls. No RN CM needs identified at this time. Patient noted to be active with [facility] SNP CM program.      Plan: RN CM will close case as patient participating in another CM program. RN CM will send MD case closure letter.  Enzo Montgomery, RN,BSN,CCM [facility] Telephonic Care Management Coordinator Direct Phone: [phone] Toll Free: [phone] Fax: [phone]

## 2019-01-27 ENCOUNTER — Ambulatory Visit (HOSPITAL_COMMUNITY)
Admission: RE | Admit: 2019-01-27 | Discharge: 2019-01-27 | Disposition: A | Discharge status: Home / Self Care | Payer: Medicare HMO | Source: Ambulatory Visit | Attending: Nephrology | Admitting: Nephrology

## 2019-01-27 ENCOUNTER — Other Ambulatory Visit: Payer: Self-pay

## 2019-01-27 DIAGNOSIS — I776 Arteritis, unspecified: Secondary | ICD-10-CM | POA: Insufficient documentation

## 2019-01-27 MED ORDER — METHYLPREDNISOLONE SODIUM SUCC 125 MG IJ SOLR
INTRAMUSCULAR | Status: AC
  Administered 2019-01-27: 125 mg via INTRAVENOUS
  Filled 2019-01-27: qty 2

## 2019-01-27 MED ORDER — ACETAMINOPHEN 325 MG PO TABS: 650.0000 mg | ORAL_TABLET | Freq: Once | ORAL | Status: AC

## 2019-01-27 MED ORDER — DIPHENHYDRAMINE HCL 50 MG/ML IJ SOLN
INTRAMUSCULAR | Status: AC
  Administered 2019-01-27: 25 mg via INTRAVENOUS
  Filled 2019-01-27: qty 1

## 2019-01-27 MED ORDER — DIPHENHYDRAMINE HCL 50 MG/ML IJ SOLN: 25.0000 mg | Freq: Once | INTRAMUSCULAR | Status: AC

## 2019-01-27 MED ORDER — METHYLPREDNISOLONE SODIUM SUCC 125 MG IJ SOLR: 125.0000 mg | Freq: Once | INTRAMUSCULAR | Status: AC

## 2019-01-27 MED ORDER — ACETAMINOPHEN 325 MG PO TABS
ORAL_TABLET | ORAL | Status: AC
  Administered 2019-01-27: 650 mg via ORAL
  Filled 2019-01-27: qty 2

## 2019-01-27 MED ORDER — SODIUM CHLORIDE 0.9 % IV SOLN
500.0000 mg | Freq: Once | INTRAVENOUS | Status: AC
  Administered 2019-01-27: 500 mg via INTRAVENOUS
  Filled 2019-01-27: qty 50

## 2019-01-31 DIAGNOSIS — L89213 Pressure ulcer of right hip, stage 3: Secondary | ICD-10-CM | POA: Diagnosis not present

## 2019-01-31 DIAGNOSIS — J9621 Acute and chronic respiratory failure with hypoxia: Secondary | ICD-10-CM | POA: Diagnosis not present

## 2019-01-31 DIAGNOSIS — L89221 Pressure ulcer of left hip, stage 1: Secondary | ICD-10-CM | POA: Diagnosis not present

## 2019-01-31 DIAGNOSIS — I776 Arteritis, unspecified: Secondary | ICD-10-CM | POA: Diagnosis not present

## 2019-02-26 DIAGNOSIS — R918 Other nonspecific abnormal finding of lung field: Secondary | ICD-10-CM | POA: Diagnosis not present

## 2019-03-03 DIAGNOSIS — L89221 Pressure ulcer of left hip, stage 1: Secondary | ICD-10-CM | POA: Diagnosis not present

## 2019-03-03 DIAGNOSIS — I776 Arteritis, unspecified: Secondary | ICD-10-CM | POA: Diagnosis not present

## 2019-03-03 DIAGNOSIS — L89213 Pressure ulcer of right hip, stage 3: Secondary | ICD-10-CM | POA: Diagnosis not present

## 2019-03-03 DIAGNOSIS — J9621 Acute and chronic respiratory failure with hypoxia: Secondary | ICD-10-CM | POA: Diagnosis not present

## 2019-03-15 ENCOUNTER — Ambulatory Visit (INDEPENDENT_AMBULATORY_CARE_PROVIDER_SITE_OTHER): Payer: Medicare HMO | Admitting: Pulmonary Disease

## 2019-03-15 ENCOUNTER — Other Ambulatory Visit: Payer: Self-pay

## 2019-03-15 ENCOUNTER — Ambulatory Visit (INDEPENDENT_AMBULATORY_CARE_PROVIDER_SITE_OTHER): Payer: Medicare HMO

## 2019-03-15 ENCOUNTER — Encounter: Payer: Self-pay | Admitting: Pulmonary Disease

## 2019-03-15 DIAGNOSIS — I776 Arteritis, unspecified: Secondary | ICD-10-CM | POA: Diagnosis not present

## 2019-03-15 DIAGNOSIS — J9611 Chronic respiratory failure with hypoxia: Secondary | ICD-10-CM

## 2019-03-15 DIAGNOSIS — D631 Anemia in chronic kidney disease: Secondary | ICD-10-CM | POA: Diagnosis not present

## 2019-03-15 DIAGNOSIS — R21 Rash and other nonspecific skin eruption: Secondary | ICD-10-CM

## 2019-03-15 DIAGNOSIS — I7782 Antineutrophilic cytoplasmic antibody (ANCA) vasculitis: Secondary | ICD-10-CM

## 2019-03-15 DIAGNOSIS — I10 Essential (primary) hypertension: Secondary | ICD-10-CM | POA: Diagnosis not present

## 2019-03-15 DIAGNOSIS — I517 Cardiomegaly: Secondary | ICD-10-CM | POA: Diagnosis not present

## 2019-03-15 DIAGNOSIS — J984 Other disorders of lung: Secondary | ICD-10-CM | POA: Diagnosis not present

## 2019-03-15 DIAGNOSIS — N189 Chronic kidney disease, unspecified: Secondary | ICD-10-CM | POA: Diagnosis not present

## 2019-03-15 NOTE — Progress Notes (Signed)
   Subjective:    Patient ID: Julia Johns, female    DOB: [birth year], 69 y.o.   MRN: [id number]  HPI  69 year old never smoker  with ANCA vasculitis/pulmonary renal syndrome  She presented with hemoptysis and abnormal CT showing lung mass in May/2020.  Underwent renal biopsy. She received 4 weeks of [name] and now is on a every 62-month schedule.   She has been maintained on oxygen- ambulatory satn 10/02/18 reviewed  Last dose of [name] was January 2021-Dr. Patel with [name] She is accompanied by her son today, who corroborates her history.  Breathing is doing much better.  She is occasionally able to go without oxygen at night. Able to do more things around the house and carry weights.  No cough or wheezing  CT renals 12/20 was reviewed which shows improved infiltrates in both bases, patchy infiltrate at the left base persists.  She would like to discuss the Covid shot Son reports snoring, she takes naps frequently  Reports significant stress related to taking her citizenship test and starting for this exam  Significant tests/ events reviewed  09/23/2018-CT chest without contrast-intralobular septal thickening in the upper lungs bilaterally with bilateral diffuse groundglass attenuation demonstrating slight sparing of lower lungs, imaging features nonspecific and edema infection inflammatory disease and hemorrhage all can have this appearance, focal area of consolidative opacity in the left lower lobe improved when compared to 06/16/2018, mosaic attenuation in the lungs bilaterally, nonspecific but likely reflecting air trapping, prominence of main pulmonary arteries suggest pulmonary arterial hypertension  10/09/2018-overnight oximetry-duration of sleep 7 hours and 34 minutes, on room air, SPO2 less than 88% 1 hour and 34 minutes and 28 seconds.  06/15/2018-Cytoplasmic C ANCA-1: 80 07/05/2018-ANCA proteinase-23.2  Review of Systems neg for any significant  sore throat, dysphagia, itching, sneezing, nasal congestion or excess/ purulent secretions, fever, chills, sweats, unintended wt loss, pleuritic or exertional cp, hempoptysis, orthopnea pnd or change in chronic leg swelling. Also denies presyncope, palpitations, heartburn, abdominal pain, nausea, vomiting, diarrhea or change in bowel or urinary habits, dysuria,hematuria, rash, arthralgias, visual complaints, headache, numbness weakness or ataxia.     Objective:   Physical Exam   Gen. Pleasant, obese, in no distress ENT - no lesions, no post nasal drip Neck: No JVD, no thyromegaly, no carotid bruits Lungs: no use of accessory muscles, no dullness to percussion, decreased without rales or rhonchi  Cardiovascular: Rhythm regular, heart sounds  normal, no murmurs or gallops, no peripheral edema Musculoskeletal: No deformities, no cyanosis or clubbing , no tremors        Assessment & Plan:

## 2019-03-15 NOTE — Patient Instructions (Signed)
Chest x-ray today.  Schedule home sleep test.  Covid vaccine is certainly recommended for you COVID-19 Vaccine Information can be found at: ShippingScam.co.uk For questions related to vaccine distribution or appointments, please email [email or web address] or call [phone].

## 2019-03-15 NOTE — Assessment & Plan Note (Addendum)
Her daytime hypoxia has subsided we will revisit whether she needs oxygen at night. Given her symptoms of snoring and frequent naps, best to proceed with home sleep testing which would also give her symptoms of nocturnal desaturations  The pathophysiology of obstructive sleep apnea , it's cardiovascular consequences & modes of treatment including CPAP were discused with the patient in detail & they evidenced understanding. Discussed risks and benefits of this testing.  Covid vaccine was also discussed and strongly recommended

## 2019-03-15 NOTE — Assessment & Plan Note (Signed)
She reports status eruptions on her face which her son attributes to allergies and stress Eventual diagnosis includes Bactrim but she does not have it anywhere else other than her face.  She will try Zyrtec over-the-counter and if worse, discuss with renal about medications

## 2019-03-15 NOTE — Assessment & Plan Note (Signed)
She was recently dosed with [name] January 2021, next dose would be July. We will obtain chest x-ray but last CT does show that infiltrates have markedly improved

## 2019-03-31 ENCOUNTER — Telehealth (INDEPENDENT_AMBULATORY_CARE_PROVIDER_SITE_OTHER): Payer: Medicare HMO | Admitting: Family Medicine

## 2019-03-31 ENCOUNTER — Encounter: Payer: Self-pay | Admitting: Family Medicine

## 2019-03-31 VITALS — Ht <= 58 in | Wt 185.0 lb

## 2019-03-31 DIAGNOSIS — J9621 Acute and chronic respiratory failure with hypoxia: Secondary | ICD-10-CM | POA: Diagnosis not present

## 2019-03-31 DIAGNOSIS — J301 Allergic rhinitis due to pollen: Secondary | ICD-10-CM

## 2019-03-31 DIAGNOSIS — L89213 Pressure ulcer of right hip, stage 3: Secondary | ICD-10-CM | POA: Diagnosis not present

## 2019-03-31 DIAGNOSIS — L89221 Pressure ulcer of left hip, stage 1: Secondary | ICD-10-CM | POA: Diagnosis not present

## 2019-03-31 DIAGNOSIS — I776 Arteritis, unspecified: Secondary | ICD-10-CM | POA: Diagnosis not present

## 2019-03-31 NOTE — Progress Notes (Addendum)
Virtual Visit via Telephone Note   This visit type was conducted due to national recommendations for restrictions regarding the COVID-19 Pandemic (e.g. social distancing) in an effort to limit this patient's exposure and mitigate transmission in our community.  Due to her co-morbid illnesses, this patient is at least at moderate risk for complications without adequate follow up.  This format is felt to be most appropriate for this patient at this time.  The patient did not have access to video technology/had technical difficulties with video requiring transitioning to audio format only (telephone).  All issues noted in this document were discussed and addressed.  No physical exam could be performed with this format.  Patient verbally consented to a telehealth visit.   Acute Office Visit  Subjective:    Patient ID: Julia Johns, female    DOB: 1950/08/30, 69 y.o.   MRN: [id number]  Chief Complaint  Patient presents with  . Cough    4 days ago dry cough  . Generalized Body Aches    Body aches    HPI Patient is in today for sneezing, runny nose, and achy x 4 days. Denies fever. Tried nyquil and robitussin night time. Sore throat initially, but improved. Not coughing. Little blood when she sneezes. Patient recently saw pulmonology and nephrology. She received a good reports. The only order was a sleep study. Hope to remove oxygen from the home. She uses it intermittently.   Past Medical History:  Diagnosis Date  . Chronic kidney disease   . Diabetes mellitus without complication [facility])     Past Surgical History:  Procedure Laterality Date  . VIDEO BRONCHOSCOPY Bilateral 06/18/2018   Procedure: VIDEO BRONCHOSCOPY WITH FLUORO;  Surgeon: Rigoberto Noel, MD;  Location: [place];  Service: Cardiopulmonary;  Laterality: Bilateral;  . VIDEO BRONCHOSCOPY WITH ENDOBRONCHIAL ULTRASOUND N/A 06/25/2018   Procedure: VIDEO BRONCHOSCOPY WITH ENDOBRONCHIAL ULTRASOUND;  Surgeon: Rigoberto Noel, MD;  [facility];  Service: Thoracic;  Laterality: N/A;    Family History  Problem Relation Age of Onset  . Uterine cancer Mother     Social History   Socioeconomic History  . Marital status: Single    Spouse name: Not on file  . Number of children: Not on file  . Years of education: Not on file  . Highest education level: Not on file  Occupational History  . Not on file  Tobacco Use  . Smoking status: Never Smoker  . Smokeless tobacco: Never Used  Substance and Sexual Activity  . Alcohol use: Never  . Drug use: Never  . Sexual activity: Not Currently  Other Topics Concern  . Not on file  Social History Narrative   Patient primary language is spanish, she lives at home with her son [name] and daughter in law [name]    Social Determinants of Health   Financial Resource Strain: [place]   . Difficulty of Paying Living Expenses: Not hard at all  Food Insecurity: No Food Insecurity  . Worried About Charity fundraiser in the Last Year: Never true  . Ran Out of Food in the Last Year: Never true  Transportation Needs: No Transportation Needs  . Lack of Transportation (Medical): No  . Lack of Transportation (Non-Medical): No  Physical Activity:   . Days of Exercise per Week: Not on file  . Minutes of Exercise per Session: Not on file  Stress:   . Feeling of Stress : Not on file  Social Connections:   .  Frequency of Communication with Friends and Family: Not on file  . Frequency of Social Gatherings with Friends and Family: Not on file  . Attends Religious Services: Not on file  . Active Member of Clubs or Organizations: Not on file  . Attends Archivist Meetings: Not on file  . Marital Status: Not on file  Intimate Partner Violence:   . Fear of Current or Ex-Partner: Not on file  . Emotionally Abused: Not on file  . Physically Abused: Not on file  . Sexually Abused: Not on file    Outpatient Medications Prior to Visit  Medication  Sig Dispense Refill  . ACCU-CHEK AVIVA PLUS test strip     . Accu-Chek Softclix Lancets lancets     . chlorthalidone (HYGROTON) 25 MG tablet Take 25 mg by mouth daily.    Marland Kitchen glipiZIDE (GLUCOTROL) 5 MG tablet Take 0.5 tablets (2.5 mg total) by mouth 2 (two) times daily before a meal. 30 tablet 2  . OXYGEN Use as directed 3 L in the mouth or throat at bedtime.    . rosuvastatin (CRESTOR) 10 MG tablet     . ondansetron (ZOFRAN-ODT) 4 MG disintegrating tablet     . thiamine (VITAMIN B-1) 50 MG tablet Take 50 mg by mouth daily.    . furosemide (LASIX) 80 MG tablet Take 0.5 tablets (40 mg total) by mouth 2 (two) times daily. 60 tablet 2  . HYDROcodone-acetaminophen (NORCO/VICODIN) 5-325 MG tablet Take 1 tablet by mouth every 6 (six) hours as needed. 6 tablet 0  . potassium chloride (K-DUR) 10 MEQ tablet Take 1 tablet (10 mEq total) by mouth daily. 60 tablet 0  . sulfamethoxazole-trimethoprim (BACTRIM DS) 800-160 MG tablet Take by mouth.     No facility-administered medications prior to visit.    Allergies  Allergen Reactions  . Penicillins Anaphylaxis    Patient stated that within a few minutes of receiving a penicillin IM injection she rapidly became unconscious and required "additional" medication to recover. This occurred when she was ~69 years old and she does not recall what was given to assist in her recovery. She did not develop a rash.   . Tetracycline Anaphylaxis    Loss of consciousness after injection     Review of Systems  Constitutional: Negative for chills, fatigue and fever.  HENT: Positive for congestion and rhinorrhea. Negative for ear pain and sore throat.   Respiratory: Negative for cough and shortness of breath.   Cardiovascular: Negative for chest pain.       Objective:    Physical Exam No distress.  Ht 4' 7.6" (1.412 m)   Wt 185 lb (83.9 kg)   BMI 42.08 kg/m  Wt Readings from Last 3 Encounters:  03/31/19 185 lb (83.9 kg)  03/15/19 185 lb (83.9 kg)  01/27/19  175 lb (79.4 kg)    Health Maintenance Due  Topic Date Due  . URINE MICROALBUMIN  05/22/1960  . TETANUS/TDAP  05/22/1969  . MAMMOGRAM  05/22/2000  . COLONOSCOPY  05/22/2000  . DEXA SCAN  05/23/2015  . PNA vac Low Risk Adult (1 of 2 - PCV13) 05/23/2015    There are no preventive care reminders to display for this patient.   No results found for: TSH Lab Results  Component Value Date   WBC 14.3 (H) 12/27/2018   HGB 13.3 12/27/2018   HCT 40.9 12/27/2018   MCV 88.5 12/27/2018   PLT 441 (H) 12/27/2018   Lab Results  Component Value Date  NA 135 12/27/2018   K 3.5 12/27/2018   CO2 25 12/27/2018   GLUCOSE 193 (H) 12/27/2018   BUN 17 12/27/2018   CREATININE 0.92 12/27/2018   BILITOT 0.3 12/27/2018   ALKPHOS 103 12/27/2018   AST 23 12/27/2018   ALT 25 12/27/2018   PROT 6.8 12/27/2018   ALBUMIN 3.6 12/27/2018   CALCIUM 9.1 12/27/2018   ANIONGAP 10 12/27/2018   GFR 59.89 (L) 10/02/2018   No results found for: CHOL No results found for: HDL No results found for: LDLCALC No results found for: TRIG No results found for: CHOLHDL Lab Results  Component Value Date   HGBA1C 7.9 (H) 07/03/2018       Assessment & Plan:  Seasonal allergic rhinitis due to pollen  Recommend otc antihistamines (zyrtec, allegra, or loratadine.)  I spent 10 minutes on phone.  [name], MD

## 2019-04-13 ENCOUNTER — Encounter: Payer: Self-pay | Admitting: Family Medicine

## 2019-04-15 ENCOUNTER — Ambulatory Visit (INDEPENDENT_AMBULATORY_CARE_PROVIDER_SITE_OTHER): Payer: Medicare HMO | Admitting: Legal Medicine

## 2019-04-15 ENCOUNTER — Encounter: Payer: Self-pay | Admitting: Legal Medicine

## 2019-04-15 ENCOUNTER — Other Ambulatory Visit: Payer: Self-pay

## 2019-04-15 DIAGNOSIS — M75111 Incomplete rotator cuff tear or rupture of right shoulder, not specified as traumatic: Secondary | ICD-10-CM | POA: Diagnosis not present

## 2019-04-15 DIAGNOSIS — M75101 Unspecified rotator cuff tear or rupture of right shoulder, not specified as traumatic: Secondary | ICD-10-CM | POA: Insufficient documentation

## 2019-04-15 DIAGNOSIS — R0789 Other chest pain: Secondary | ICD-10-CM | POA: Insufficient documentation

## 2019-04-15 MED ORDER — ROSUVASTATIN CALCIUM 10 MG PO TABS: 10.0000 mg | ORAL_TABLET | Freq: Every day | ORAL | 1 refills | Status: DC

## 2019-04-15 NOTE — Progress Notes (Signed)
Established Patient Office Visit  Subjective:  Patient ID: Julia Johns, female    DOB: 1950/07/22  Age: 69 y.o. MRN: [id number]  CC:  Chief Complaint  Patient presents with  . Shoulder Pain    Since 2 moths ago throbbing pain  . Right chest Pain    Sharp pain    HPI Julia Johns presents for pain shoulder for 2 months.  Pain abducting shoulder and lifting above head.  She also has chest wall pan located at costochondral function. On right.  She just got covid shot today.  We want to avoid steroids at this time.  Can inject area after 2 weeks.  Past Medical History:  Diagnosis Date  . Chronic kidney disease   . Diabetes mellitus without complication ([place])   . Vasculitis ([place])   . 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 VIDEO BRONCHOSCOPY Bilateral 06/18/2018   Procedure: VIDEO BRONCHOSCOPY WITH FLUORO;  Surgeon: Rigoberto Noel, MD;  Location: [place];  Service: Cardiopulmonary;  Laterality: Bilateral;  . VIDEO BRONCHOSCOPY WITH ENDOBRONCHIAL ULTRASOUND N/A 06/25/2018   Procedure: VIDEO BRONCHOSCOPY WITH ENDOBRONCHIAL ULTRASOUND;  Surgeon: Rigoberto Noel, MD;  [facility];  Service: Thoracic;  Laterality: N/A;    Family History  Problem Relation Age of Onset  . Uterine cancer Mother     Social History   Socioeconomic History  . Marital status: Single    Spouse name: Not on file  . Number of children: Not on file  . Years of education: Not on file  . Highest education level: Not on file  Occupational History  . Not on file  Tobacco Use  . Smoking status: Never Smoker  . Smokeless tobacco: Never Used  Substance and Sexual Activity  . Alcohol use: Never  . Drug use: Never  . Sexual activity: Not Currently  Other Topics Concern  . Not on file  Social History Narrative   Patient primary language is spanish, she lives at home with her son Julia Johns and daughter in law Julia  Johns    Social Determinants of Health   Financial Resource Strain: [place]   . Difficulty of Paying Living Expenses: Not hard at all  Food Insecurity: No Food Insecurity  . Worried About Charity fundraiser in the Last Year: Never true  . Ran Out of Food in the Last Year: Never true  Transportation Needs: No Transportation Needs  . Lack of Transportation (Medical): No  . Lack of Transportation (Non-Medical): No  Physical Activity:   . Days of Exercise per Week:   . Minutes of Exercise per Session:   Stress:   . Feeling of Stress :   Social Connections:   . Frequency of Communication with Friends and Family:   . Frequency of Social Gatherings with Friends and Family:   . Attends Religious Services:   . Active Member of Clubs or Organizations:   . Attends Archivist Meetings:   Julia Johns Marital Status:   Intimate Partner Violence:   . Fear of Current or Ex-Partner:   . Emotionally Abused:   Julia Johns Physically Abused:   . Sexually Abused:     Outpatient Medications Prior to Visit  Medication Sig Dispense Refill  . ACCU-CHEK AVIVA PLUS test strip     . Accu-Chek Softclix Lancets lancets     . chlorthalidone (HYGROTON) 25 MG tablet Take 25 mg by mouth daily.    Julia Johns  glipiZIDE (GLUCOTROL) 5 MG tablet Take 0.5 tablets (2.5 mg total) by mouth 2 (two) times daily before a meal. 30 tablet 2  . OXYGEN Use as directed 3 L in the mouth or throat at bedtime.    . thiamine (VITAMIN B-1) 50 MG tablet Take 50 mg by mouth daily.    . rosuvastatin (CRESTOR) 10 MG tablet     . ondansetron (ZOFRAN-ODT) 4 MG disintegrating tablet      No facility-administered medications prior to visit.    Allergies  Allergen Reactions  . Penicillins Anaphylaxis    Patient stated that within a few minutes of receiving a penicillin IM injection she rapidly became unconscious and required "additional" medication to recover. This occurred when she was ~69 years old and she does not recall what was given to  assist in her recovery. She did not develop a rash.   . Tetracycline Anaphylaxis    Loss of consciousness after injection     ROS Review of Systems  Constitutional: Negative.   HENT: Negative.   Eyes: Negative.   Respiratory: Negative.   Cardiovascular: Negative.   Genitourinary: Negative.   Musculoskeletal: Positive for arthralgias.  Neurological: Negative.   Psychiatric/Behavioral: Negative.       Objective:    Physical Exam  Constitutional: She is oriented to person, place, and time. She appears well-developed and well-nourished.  HENT:  Head: Normocephalic and atraumatic.  Cardiovascular: Normal rate, regular rhythm and normal heart sounds.  Pulmonary/Chest: Effort normal and breath sounds normal.  Pain over right costochondral area.  Musculoskeletal:     Right shoulder: Tenderness and pain present.       Arms:     Cervical back: Normal range of motion and neck supple.     Comments: Right shoulder flexion 90 degrees, abduction 80 degrees, internal and external rotation 90 degrees.  Positive Neer  Neurological: She is alert and oriented to person, place, and time. She has normal reflexes.  Vitals reviewed.   BP 122/70   Pulse 67   Temp 98.2 F (36.8 C)   Resp 17   Ht 4\' 8"  (1.422 m)   Wt 189 lb 12.8 oz (86.1 kg)   SpO2 95%   BMI 42.55 kg/m  Wt Readings from Last 3 Encounters:  04/15/19 189 lb 12.8 oz (86.1 kg)  03/31/19 185 lb (83.9 kg)  03/15/19 185 lb (83.9 kg)     Health Maintenance Due  Topic Date Due  . URINE MICROALBUMIN  Never done  . TETANUS/TDAP  Never done  . MAMMOGRAM  Never done  . COLONOSCOPY  Never done  . DEXA SCAN  Never done  . PNA vac Low Risk Adult (1 of 2 - PCV13) Never done    There are no preventive care reminders to display for this patient.  No results found for: TSH Lab Results  Component Value Date   WBC 14.3 (H) 12/27/2018   HGB 13.3 12/27/2018   HCT 40.9 12/27/2018   MCV 88.5 12/27/2018   PLT 441 (H) 12/27/2018    Lab Results  Component Value Date   NA 135 12/27/2018   K 3.5 12/27/2018   CO2 25 12/27/2018   GLUCOSE 193 (H) 12/27/2018   BUN 17 12/27/2018   CREATININE 0.92 12/27/2018   BILITOT 0.3 12/27/2018   ALKPHOS 103 12/27/2018   AST 23 12/27/2018   ALT 25 12/27/2018   PROT 6.8 12/27/2018   ALBUMIN 3.6 12/27/2018   CALCIUM 9.1 12/27/2018   ANIONGAP 10 12/27/2018  GFR 59.89 (L) 10/02/2018   No results found for: CHOL No results found for: HDL No results found for: LDLCALC No results found for: TRIG No results found for: CHOLHDL Lab Results  Component Value Date   HGBA1C 7.9 (H) 07/03/2018      Assessment & Plan:   Problem List Items Addressed This Visit      Musculoskeletal and Integument   Right rotator cuff tear    Patient has pain right shoulder with trapezius spasms.  Abduction 80 degrees, flexion 90 degres, rotation 90 degrees.        Other   Costochondral chest pain    Pain is focal over lower right costochondral joint.  This area triggers all the pain.  She doe snot want pain medicines.  We will await 2nd Covid shot and inject if needed.  Patient understands and agrees.         Meds ordered this encounter  Medications  . rosuvastatin (CRESTOR) 10 MG tablet    Sig: Take 1 tablet (10 mg total) by mouth daily.    Dispense:  90 tablet    Refill:  1    Follow-up: Return after 2nd covid shot.    Reinaldo Meeker, MD

## 2019-04-15 NOTE — Assessment & Plan Note (Signed)
Pain is focal over lower right costochondral joint.  This area triggers all the pain.  She doe snot want pain medicines.  We will await 2nd Covid shot and inject if needed.  Patient understands and agrees.

## 2019-04-15 NOTE — Assessment & Plan Note (Signed)
Patient has pain right shoulder with trapezius spasms.  Abduction 80 degrees, flexion 90 degres, rotation 90 degrees.

## 2019-04-15 NOTE — Patient Instructions (Signed)
Shoulder Exercises Ask your health care provider which exercises are safe for you. Do exercises exactly as told by your health care provider and adjust them as directed. It is normal to feel mild stretching, pulling, tightness, or discomfort as you do these exercises. Stop right away if you feel sudden pain or your pain gets worse. Do not begin these exercises until told by your health care provider. Stretching exercises External rotation and abduction This exercise is sometimes called corner stretch. This exercise rotates your arm outward (external rotation) and moves your arm out from your body (abduction). 1. Stand in a doorway with one of your feet slightly in front of the other. This is called a staggered stance. If you cannot reach your forearms to the door frame, stand facing a corner of a room. 2. Choose one of the following positions as told by your health care provider: ? Place your hands and forearms on the door frame above your head. ? Place your hands and forearms on the door frame at the height of your head. ? Place your hands on the door frame at the height of your elbows. 3. Slowly move your weight onto your front foot until you feel a stretch across your chest and in the front of your shoulders. Keep your head and chest upright and keep your abdominal muscles tight. 4. Hold for __________ seconds. 5. To release the stretch, shift your weight to your back foot. Repeat __________ times. Complete this exercise __________ times a day. Extension, standing 1. Stand and hold a broomstick, a cane, or a similar object behind your back. ? Your hands should be a little wider than shoulder width apart. ? Your palms should face away from your back. 2. Keeping your elbows straight and your shoulder muscles relaxed, move the stick away from your body until you feel a stretch in your shoulders (extension). ? Avoid shrugging your shoulders while you move the stick. Keep your shoulder blades tucked  down toward the middle of your back. 3. Hold for __________ seconds. 4. Slowly return to the starting position. Repeat __________ times. Complete this exercise __________ times a day. Range-of-motion exercises Pendulum  1. Stand near a wall or a surface that you can hold onto for balance. 2. Bend at the waist and let your left / right arm hang straight down. Use your other arm to support you. Keep your back straight and do not lock your knees. 3. Relax your left / right arm and shoulder muscles, and move your hips and your trunk so your left / right arm swings freely. Your arm should swing because of the motion of your body, not because you are using your arm or shoulder muscles. 4. Keep moving your hips and trunk so your arm swings in the following directions, as told by your health care provider: ? Side to side. ? Forward and backward. ? In clockwise and counterclockwise circles. 5. Continue each motion for __________ seconds, or for as long as told by your health care provider. 6. Slowly return to the starting position. Repeat __________ times. Complete this exercise __________ times a day. Shoulder flexion, standing  1. Stand and hold a broomstick, a cane, or a similar object. Place your hands a little more than shoulder width apart on the object. Your left / right hand should be palm up, and your other hand should be palm down. 2. Keep your elbow straight and your shoulder muscles relaxed. Push the stick up with your healthy arm to   raise your left / right arm in front of your body, and then over your head until you feel a stretch in your shoulder (flexion). ? Avoid shrugging your shoulder while you raise your arm. Keep your shoulder blade tucked down toward the middle of your back. 3. Hold for __________ seconds. 4. Slowly return to the starting position. Repeat __________ times. Complete this exercise __________ times a day. Shoulder abduction, standing 1. Stand and hold a broomstick,  a cane, or a similar object. Place your hands a little more than shoulder width apart on the object. Your left / right hand should be palm up, and your other hand should be palm down. 2. Keep your elbow straight and your shoulder muscles relaxed. Push the object across your body toward your left / right side. Raise your left / right arm to the side of your body (abduction) until you feel a stretch in your shoulder. ? Do not raise your arm above shoulder height unless your health care provider tells you to do that. ? If directed, raise your arm over your head. ? Avoid shrugging your shoulder while you raise your arm. Keep your shoulder blade tucked down toward the middle of your back. 3. Hold for __________ seconds. 4. Slowly return to the starting position. Repeat __________ times. Complete this exercise __________ times a day. Internal rotation  1. Place your left / right hand behind your back, palm up. 2. Use your other hand to dangle an exercise band, a towel, or a similar object over your shoulder. Grasp the band with your left / right hand so you are holding on to both ends. 3. Gently pull up on the band until you feel a stretch in the front of your left / right shoulder. The movement of your arm toward the center of your body is called internal rotation. ? Avoid shrugging your shoulder while you raise your arm. Keep your shoulder blade tucked down toward the middle of your back. 4. Hold for __________ seconds. 5. Release the stretch by letting go of the band and lowering your hands. Repeat __________ times. Complete this exercise __________ times a day. Strengthening exercises External rotation  1. Sit in a stable chair without armrests. 2. Secure an exercise band to a stable object at elbow height on your left / right side. 3. Place a soft object, such as a folded towel or a small pillow, between your left / right upper arm and your body to move your elbow about 4 inches (10 cm) away  from your side. 4. Hold the end of the exercise band so it is tight and there is no slack. 5. Keeping your elbow pressed against the soft object, slowly move your forearm out, away from your abdomen (external rotation). Keep your body steady so only your forearm moves. 6. Hold for __________ seconds. 7. Slowly return to the starting position. Repeat __________ times. Complete this exercise __________ times a day. Shoulder abduction  1. Sit in a stable chair without armrests, or stand up. 2. Hold a __________ weight in your left / right hand, or hold an exercise band with both hands. 3. Start with your arms straight down and your left / right palm facing in, toward your body. 4. Slowly lift your left / right hand out to your side (abduction). Do not lift your hand above shoulder height unless your health care provider tells you that this is safe. ? Keep your arms straight. ? Avoid shrugging your shoulder while you   do this movement. Keep your shoulder blade tucked down toward the middle of your back. 5. Hold for __________ seconds. 6. Slowly lower your arm, and return to the starting position. Repeat __________ times. Complete this exercise __________ times a day. Shoulder extension 1. Sit in a stable chair without armrests, or stand up. 2. Secure an exercise band to a stable object in front of you so it is at shoulder height. 3. Hold one end of the exercise band in each hand. Your palms should face each other. 4. Straighten your elbows and lift your hands up to shoulder height. 5. Step back, away from the secured end of the exercise band, until the band is tight and there is no slack. 6. Squeeze your shoulder blades together as you pull your hands down to the sides of your thighs (extension). Stop when your hands are straight down by your sides. Do not let your hands go behind your body. 7. Hold for __________ seconds. 8. Slowly return to the starting position. Repeat __________ times.  Complete this exercise __________ times a day. Shoulder row 1. Sit in a stable chair without armrests, or stand up. 2. Secure an exercise band to a stable object in front of you so it is at waist height. 3. Hold one end of the exercise band in each hand. Position your palms so that your thumbs are facing the ceiling (neutral position). 4. Bend each of your elbows to a 90-degree angle (right angle) and keep your upper arms at your sides. 5. Step back until the band is tight and there is no slack. 6. Slowly pull your elbows back behind you. 7. Hold for __________ seconds. 8. Slowly return to the starting position. Repeat __________ times. Complete this exercise __________ times a day. Shoulder press-ups  1. Sit in a stable chair that has armrests. Sit upright, with your feet flat on the floor. 2. Put your hands on the armrests so your elbows are bent and your fingers are pointing forward. Your hands should be about even with the sides of your body. 3. Push down on the armrests and use your arms to lift yourself off the chair. Straighten your elbows and lift yourself up as much as you comfortably can. ? Move your shoulder blades down, and avoid letting your shoulders move up toward your ears. ? Keep your feet on the ground. As you get stronger, your feet should support less of your body weight as you lift yourself up. 4. Hold for __________ seconds. 5. Slowly lower yourself back into the chair. Repeat __________ times. Complete this exercise __________ times a day. Wall push-ups  1. Stand so you are facing a stable wall. Your feet should be about one arm-length away from the wall. 2. Lean forward and place your palms on the wall at shoulder height. 3. Keep your feet flat on the floor as you bend your elbows and lean forward toward the wall. 4. Hold for __________ seconds. 5. Straighten your elbows to push yourself back to the starting position. Repeat __________ times. Complete this exercise  __________ times a day. This information is not intended to replace advice given to you by your health care provider. Make sure you discuss any questions you have with your health care provider. Document Revised: 05/01/2018 Document Reviewed: 02/06/2018 Elsevier Patient Education  2020 [employer]  

## 2019-04-16 ENCOUNTER — Other Ambulatory Visit: Payer: Self-pay

## 2019-04-22 ENCOUNTER — Other Ambulatory Visit: Payer: Self-pay | Admitting: Family Medicine

## 2019-04-22 DIAGNOSIS — R918 Other nonspecific abnormal finding of lung field: Secondary | ICD-10-CM | POA: Diagnosis not present

## 2019-05-01 DIAGNOSIS — L89221 Pressure ulcer of left hip, stage 1: Secondary | ICD-10-CM | POA: Diagnosis not present

## 2019-05-01 DIAGNOSIS — I776 Arteritis, unspecified: Secondary | ICD-10-CM | POA: Diagnosis not present

## 2019-05-01 DIAGNOSIS — J9621 Acute and chronic respiratory failure with hypoxia: Secondary | ICD-10-CM | POA: Diagnosis not present

## 2019-05-01 DIAGNOSIS — L89213 Pressure ulcer of right hip, stage 3: Secondary | ICD-10-CM | POA: Diagnosis not present

## 2019-05-03 ENCOUNTER — Telehealth: Payer: Self-pay | Admitting: Pulmonary Disease

## 2019-05-03 DIAGNOSIS — J9611 Chronic respiratory failure with hypoxia: Secondary | ICD-10-CM

## 2019-05-03 NOTE — Telephone Encounter (Signed)
LM to schedule

## 2019-05-04 NOTE — Telephone Encounter (Signed)
I called & spoke with the patient's representative, [name] (daughter in law).    1.  Pt is no longer wearing O2 at night & would like to get her O2 picked up from the DME.  2.  Pt does not wish to have HST done at this time.    Would like to discuss both things further w/ provider & decide how to proceed.    [name] can be reached today at [phone].

## 2019-05-04 NOTE — Telephone Encounter (Signed)
Called and spoke with pt's daughter in law Julia Johns. Julia Johns stated that she has been contacted by [employer] to see if pt still needs her O2 anymore. Julia Johns stated that she had a conversation with pt and pt's son about the O2 and pt told them that she was better and that she no longer needed her O2 and wanted to have it picked up. She said that pt told them that it had been at least 3 weeks since she last wore her O2. Julia Johns also stated that pt also does not want to have the HST done at this time either.  Dr. [name], please advise on this for pt and daughter in law Julia Johns if you are fine with [place] placing an order to have pt's O2 discontinued. Also, Julia Johns wants you to advise if you think it is necessary for pt to have the HST or if you are fine with pt holding off at this time per pt's request?

## 2019-05-04 NOTE — Telephone Encounter (Signed)
Order placed to discontinue oxygen. Home sleep test was cancelled. [name] was made aware.

## 2019-05-04 NOTE — Telephone Encounter (Signed)
OK to dc O2 OK to cancel HST if she does not want to puruse

## 2019-05-04 NOTE — Addendum Note (Signed)
Addended by: Jannette Spanner on: 05/04/2019 03:22 PM   Modules accepted: Orders

## 2019-05-12 NOTE — Progress Notes (Signed)
Cancelled. Kc

## 2019-05-14 ENCOUNTER — Ambulatory Visit (INDEPENDENT_AMBULATORY_CARE_PROVIDER_SITE_OTHER): Payer: Medicare HMO | Admitting: Family Medicine

## 2019-05-14 DIAGNOSIS — I776 Arteritis, unspecified: Secondary | ICD-10-CM

## 2019-05-23 ENCOUNTER — Encounter: Payer: Self-pay | Admitting: Family Medicine

## 2019-05-24 NOTE — Progress Notes (Signed)
Subjective:  Patient ID: Julia Johns, female    DOB: February 18, 1950  Age: 69 y.o. MRN: [id number]  Chief Complaint  Patient presents with  . Diabetes  . Hyperlipidemia    HPI  Patient is a 69 year old [name] who presents for follow-up of diabetes, hyperlipidemia, and vasculitis.  Approximately 1 year ago she came to me with significant abdominal pain and breathing issues.  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  She also had pulmonary issues related to the vasculitis.  She is currently following with nephrology and pulmonology.  I am following her diabetes.  She checks her sugars 2-3 times per week and they run between 101 120.  She is working on eating healthy and exercising.  Denies falls.  She does check her feet every day.  She is currently taking glipizide 5 mg 1/2 tablet in the morning and a whole tablet before bed.  For this C ANCA vasculitis she has been doing [name] every 6 months.  She has to do this for another year.  In addition she is on Bactrim DS 1 tablet 3 times weekly.  For her hyperlipidemia she currently takes Crestor 10 mg once a day.  She had significant problems with swelling for period of time and is taking chlorthalidone 25 mg once daily.  Past Medical History:  Diagnosis Date  . Chronic kidney disease   . Diabetes mellitus without complication ([place])   . Vasculitis ([place])   . 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 VIDEO BRONCHOSCOPY Bilateral 06/18/2018   Procedure: VIDEO BRONCHOSCOPY WITH FLUORO;  Surgeon: Rigoberto Noel, MD;  Location: [facility];  Service: Cardiopulmonary;  Laterality: Bilateral;  . VIDEO BRONCHOSCOPY WITH ENDOBRONCHIAL ULTRASOUND N/A 06/25/2018   Procedure: VIDEO BRONCHOSCOPY WITH ENDOBRONCHIAL ULTRASOUND;  Surgeon: Rigoberto Noel, MD;  Location: [facility];  Service: Thoracic;  Laterality: N/A;    Family History   Problem Relation Age of Onset  . Uterine cancer Mother    Social History   Socioeconomic History  . Marital status: Single    Spouse name: Not on file  . Number of children: Not on file  . Years of education: Not on file  . Highest education level: Not on file  Occupational History  . Not on file  Tobacco Use  . Smoking status: Never Smoker  . Smokeless tobacco: Never Used  Substance and Sexual Activity  . Alcohol use: Never  . Drug use: Never  . Sexual activity: Not Currently  Other Topics Concern  . Not on file  Social History Narrative   Patient primary language is spanish, she lives at home with her son [name] and daughter in law [name]    Social Determinants of Health   Financial Resource Strain: [place]   . Difficulty of Paying Living Expenses: Not hard at all  Food Insecurity: No Food Insecurity  . Worried About Charity fundraiser in the Last Year: Never true  . Ran Out of Food in the Last Year: Never true  Transportation Needs: No Transportation Needs  . Lack of Transportation (Medical): No  . Lack of Transportation (Non-Medical): No  Physical Activity:   . Days of Exercise per Week:   . Minutes of Exercise per Session:   Stress:   . Feeling of Stress :   Social Connections:   . Frequency of Communication with Friends and  Family:   . Frequency of Social Gatherings with Friends and Family:   . Attends Religious Services:   . Active Member of Clubs or Organizations:   . Attends Archivist Meetings:   Marland Kitchen Marital Status:     Review of Systems  Constitutional: Negative for chills, fatigue and fever.  HENT: Negative for congestion, ear pain, rhinorrhea and sore throat.   Respiratory: Negative for cough and shortness of breath.   Cardiovascular: Negative for chest pain.  Gastrointestinal: Negative for abdominal pain, constipation, diarrhea, nausea and vomiting.  Endocrine: Negative for polydipsia, polyphagia and polyuria.   Genitourinary: Negative for dysuria and urgency.  Musculoskeletal: Positive for arthralgias (Right shoulder). Negative for back pain and myalgias.  Skin: Positive for rash (circle around neck. ).  Neurological: Negative for dizziness, weakness, light-headedness and headaches.  Psychiatric/Behavioral: Negative for dysphoric mood. The patient is not nervous/anxious.      Objective:  BP 110/80   Pulse 76   Temp (!) 97.5 F (36.4 C)   Resp 18   Ht 4\' 8"  (1.422 m)   Wt 188 lb 9.6 oz (85.5 kg)   BMI 42.28 kg/m   BP/Weight 05/25/2019 04/15/2019 05/15/9561  Systolic BP 875 643 -  Diastolic BP 80 70 -  Wt. (Lbs) 188.6 189.8 185  BMI 42.28 42.55 42.08    Physical Exam Vitals reviewed.  Constitutional:      Appearance: Normal appearance. She is normal weight.  Cardiovascular:     Rate and Rhythm: Normal rate and regular rhythm.     Pulses: Normal pulses.     Heart sounds: Normal heart sounds.  Pulmonary:     Effort: Pulmonary effort is normal. No respiratory distress.     Breath sounds: Normal breath sounds.  Abdominal:     General: Abdomen is flat. Bowel sounds are normal.     Palpations: Abdomen is soft.     Tenderness: There is no abdominal tenderness.  Musculoskeletal:        General: Tenderness (anterior rt shoulder. Poor ROM. Unable to abduct her right arm fully. no internal or external rotation.) present.  Neurological:     Mental Status: She is alert and oriented to person, place, and time.  Psychiatric:        Mood and Affect: Mood normal.        Behavior: Behavior normal.    Lab Results  Component Value Date   WBC 11.0 (H) 05/25/2019   HGB 14.3 05/25/2019   HCT 46.1 05/25/2019   PLT 426 05/25/2019   GLUCOSE 106 (H) 05/25/2019   CHOL 153 05/25/2019   TRIG 171 (H) 05/25/2019   HDL 57 05/25/2019   LDLCALC 67 05/25/2019   ALT 13 05/25/2019   AST 17 05/25/2019   NA 143 05/25/2019   K 3.7 05/25/2019   CL 102 05/25/2019   CREATININE 0.64 05/25/2019   BUN 16  05/25/2019   CO2 24 05/25/2019   INR 1.2 07/05/2018   HGBA1C 6.2 (H) 05/25/2019      Assessment & Plan:  1. Dyslipidemia associated with type 2 diabetes mellitus (HCC) Control: good Recommend check sugars fasting 3 times a week. Recommend check feet daily. Recommend annual eye exams. Medicines: no changes Continue to work on eating a healthy diet and exercise.  Labs drawn today.   - Comprehensive metabolic panel - Lipid panel - Hemoglobin A1c - CBC with Differential/Platelet  2. Impingement syndrome of right shoulder Risks were discussed including bleeding, infection, increase in sugars if diabetic,  atrophy at site of injection, and increased pain.  After consent was obtained, using sterile technique the right shoulder was prepped with alcohol.  Injection was given from posterior approach. Kenalog 40 mg and 5 ml plain Lidocaine was then injected and the needle withdrawn.  The procedure was well tolerated.   The patient is asked to continue to rest the joint for a few more days before resuming regular activities.  It may be more painful for the first 1-2 days.  Watch for fever, or increased swelling or persistent pain in the joint. Call or return to clinic prn if such symptoms occur or there is failure to improve as anticipated. - triamcinolone acetonide (KENALOG-40) injection 40 mg  3. Rash of neck - triamcinolone cream (KENALOG) 0.1 %; Apply 1 application topically 2 (two) times daily.  Dispense: 45 g; Refill: 3  4. Mixed hyperlipidemia Well controlled.  No changes to medicines.  Continue to work on eating a healthy diet and exercise.  Labs drawn today.   5. 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  Refill of bactrim sent as patient has not received a response from her specialist and she requested refill 3 days ago. Only given one month to ensure pt follows up appropriately.    Meds ordered this  encounter  Medications  . triamcinolone acetonide (KENALOG-40) injection 40 mg  . triamcinolone cream (KENALOG) 0.1 %    Sig: Apply 1 application topically 2 (two) times daily.    Dispense:  45 g    Refill:  3  . glipiZIDE (GLUCOTROL) 5 MG tablet    Sig: Take 0.5 tablets (2.5 mg total) by mouth 2 (two) times daily before a meal. 1/2 tablet in am and 1 tablet in pm    Dispense:  180 tablet    Refill:  0  . sulfamethoxazole-trimethoprim (BACTRIM DS) 800-160 MG tablet    Sig: TAKE 1 TABLET BY MOUTH ON MONDAY, WEDNESDAY AND FRIDAY    Dispense:  12 tablet    Refill:  0    Orders Placed This Encounter  Procedures  . Comprehensive metabolic panel  . Lipid panel  . Hemoglobin A1c  . CBC with Differential/Platelet  . Cardiovascular Risk Assessment     Follow-up: Return in about 3 months (around 08/25/2019).  An After Visit Summary was printed and given to the patient.  [name] [facility] [phone]

## 2019-05-25 ENCOUNTER — Ambulatory Visit (INDEPENDENT_AMBULATORY_CARE_PROVIDER_SITE_OTHER): Payer: Medicare HMO | Admitting: Family Medicine

## 2019-05-25 ENCOUNTER — Encounter: Payer: Self-pay | Admitting: Family Medicine

## 2019-05-25 ENCOUNTER — Other Ambulatory Visit: Payer: Self-pay

## 2019-05-25 ENCOUNTER — Ambulatory Visit: Payer: Medicare HMO | Admitting: Family Medicine

## 2019-05-25 VITALS — BP 110/80 | HR 76 | Temp 97.5°F | Resp 18 | Ht <= 58 in | Wt 188.6 lb

## 2019-05-25 DIAGNOSIS — E1169 Type 2 diabetes mellitus with other specified complication: Secondary | ICD-10-CM

## 2019-05-25 DIAGNOSIS — E782 Mixed hyperlipidemia: Secondary | ICD-10-CM | POA: Diagnosis not present

## 2019-05-25 DIAGNOSIS — I7782 Antineutrophilic cytoplasmic antibody (ANCA) vasculitis: Secondary | ICD-10-CM

## 2019-05-25 DIAGNOSIS — M318 Other specified necrotizing vasculopathies: Secondary | ICD-10-CM

## 2019-05-25 DIAGNOSIS — R21 Rash and other nonspecific skin eruption: Secondary | ICD-10-CM | POA: Diagnosis not present

## 2019-05-25 DIAGNOSIS — M7541 Impingement syndrome of right shoulder: Secondary | ICD-10-CM

## 2019-05-25 DIAGNOSIS — E785 Hyperlipidemia, unspecified: Secondary | ICD-10-CM | POA: Diagnosis not present

## 2019-05-25 MED ORDER — SULFAMETHOXAZOLE-TRIMETHOPRIM 800-160 MG PO TABS: ORAL_TABLET | ORAL | 0 refills | Status: DC

## 2019-05-25 MED ORDER — TRIAMCINOLONE ACETONIDE 40 MG/ML IJ SUSP: 40.0000 mg | Freq: Once | INTRAMUSCULAR | Status: DC

## 2019-05-25 MED ORDER — GLIPIZIDE 5 MG PO TABS: 2.5000 mg | ORAL_TABLET | Freq: Two times a day (BID) | ORAL | 0 refills | Status: DC

## 2019-05-25 MED ORDER — TRIAMCINOLONE ACETONIDE 0.1 % EX CREA: 1.0000 "application " | TOPICAL_CREAM | Freq: Two times a day (BID) | CUTANEOUS | 3 refills | Status: DC

## 2019-05-26 ENCOUNTER — Other Ambulatory Visit: Payer: Self-pay

## 2019-05-26 DIAGNOSIS — E782 Mixed hyperlipidemia: Secondary | ICD-10-CM | POA: Insufficient documentation

## 2019-05-26 DIAGNOSIS — M7541 Impingement syndrome of right shoulder: Secondary | ICD-10-CM | POA: Insufficient documentation

## 2019-05-26 DIAGNOSIS — E1169 Type 2 diabetes mellitus with other specified complication: Secondary | ICD-10-CM | POA: Insufficient documentation

## 2019-05-26 LAB — LIPID PANEL
Chol/HDL Ratio: 2.7 ratio (ref 0.0–4.4)
Cholesterol, Total: 153 mg/dL (ref 100–199)
HDL: 57 mg/dL (ref >39)
LDL Chol Calc (NIH): 67 mg/dL (ref 0–99)
Triglycerides: 171 mg/dL — ABNORMAL HIGH (ref 0–149)
VLDL Cholesterol Cal: 29 mg/dL (ref 5–40)

## 2019-05-26 LAB — COMPREHENSIVE METABOLIC PANEL
ALT: 13 IU/L (ref 0–32)
AST: 17 IU/L (ref 0–40)
Albumin/Globulin Ratio: 1.7 (ref 1.2–2.2)
Albumin: 4.4 g/dL (ref 3.8–4.8)
Alkaline Phosphatase: 114 IU/L (ref 39–117)
BUN/Creatinine Ratio: 25 (ref 12–28)
BUN: 16 mg/dL (ref 8–27)
Bilirubin Total: 0.3 mg/dL (ref 0.0–1.2)
CO2: 24 mmol/L (ref 20–29)
Calcium: 10.2 mg/dL (ref 8.7–10.3)
Chloride: 102 mmol/L (ref 96–106)
Creatinine, Ser: 0.64 mg/dL (ref 0.57–1.00)
GFR calc Af Amer: 105 mL/min/{1.73_m2} (ref >59)
GFR calc non Af Amer: 91 mL/min/{1.73_m2} (ref >59)
Globulin, Total: 2.6 g/dL (ref 1.5–4.5)
Glucose: 106 mg/dL — ABNORMAL HIGH (ref 65–99)
Potassium: 3.7 mmol/L (ref 3.5–5.2)
Sodium: 143 mmol/L (ref 134–144)
Total Protein: 7 g/dL (ref 6.0–8.5)

## 2019-05-26 LAB — HEMOGLOBIN A1C
Est. average glucose Bld gHb Est-mCnc: 131 mg/dL
Hgb A1c MFr Bld: 6.2 % — ABNORMAL HIGH (ref 4.8–5.6)

## 2019-05-26 LAB — CBC WITH DIFFERENTIAL/PLATELET
Basophils Absolute: 0.1 10*3/uL (ref 0.0–0.2)
Basos: 1 %
EOS (ABSOLUTE): 0.3 10*3/uL (ref 0.0–0.4)
Eos: 3 %
Hematocrit: 46.1 % (ref 34.0–46.6)
Hemoglobin: 14.3 g/dL (ref 11.1–15.9)
Immature Grans (Abs): 0 10*3/uL (ref 0.0–0.1)
Immature Granulocytes: 0 %
Lymphocytes Absolute: 6.4 10*3/uL — ABNORMAL HIGH (ref 0.7–3.1)
Lymphs: 58 %
MCH: 28.3 pg (ref 26.6–33.0)
MCHC: 31 g/dL — ABNORMAL LOW (ref 31.5–35.7)
MCV: 91 fL (ref 79–97)
Monocytes Absolute: 0.8 10*3/uL (ref 0.1–0.9)
Monocytes: 7 %
Neutrophils Absolute: 3.4 10*3/uL (ref 1.4–7.0)
Neutrophils: 31 %
Platelets: 426 10*3/uL (ref 150–450)
RBC: 5.06 x10E6/uL (ref 3.77–5.28)
RDW: 12.9 % (ref 11.7–15.4)
WBC: 11 10*3/uL — ABNORMAL HIGH (ref 3.4–10.8)

## 2019-05-26 LAB — CARDIOVASCULAR RISK ASSESSMENT

## 2019-06-01 ENCOUNTER — Other Ambulatory Visit: Payer: Self-pay | Admitting: Family Medicine

## 2019-06-07 DIAGNOSIS — Z79899 Other long term (current) drug therapy: Secondary | ICD-10-CM | POA: Diagnosis not present

## 2019-06-07 DIAGNOSIS — M170 Bilateral primary osteoarthritis of knee: Secondary | ICD-10-CM | POA: Diagnosis not present

## 2019-06-07 DIAGNOSIS — H5711 Ocular pain, right eye: Secondary | ICD-10-CM | POA: Diagnosis not present

## 2019-06-07 DIAGNOSIS — M3131 Wegener's granulomatosis with renal involvement: Secondary | ICD-10-CM | POA: Diagnosis not present

## 2019-06-22 ENCOUNTER — Other Ambulatory Visit: Payer: Self-pay | Admitting: Family Medicine

## 2019-06-29 DIAGNOSIS — N179 Acute kidney failure, unspecified: Secondary | ICD-10-CM | POA: Diagnosis not present

## 2019-07-05 DIAGNOSIS — I10 Essential (primary) hypertension: Secondary | ICD-10-CM | POA: Diagnosis not present

## 2019-07-05 DIAGNOSIS — I776 Arteritis, unspecified: Secondary | ICD-10-CM | POA: Diagnosis not present

## 2019-07-05 DIAGNOSIS — N179 Acute kidney failure, unspecified: Secondary | ICD-10-CM | POA: Diagnosis not present

## 2019-07-27 ENCOUNTER — Other Ambulatory Visit (HOSPITAL_COMMUNITY): Payer: Self-pay | Admitting: *Deleted

## 2019-07-28 ENCOUNTER — Other Ambulatory Visit: Payer: Self-pay

## 2019-07-28 ENCOUNTER — Encounter (HOSPITAL_COMMUNITY)
Admission: RE | Admit: 2019-07-28 | Discharge: 2019-07-28 | Disposition: A | Discharge status: Home / Self Care | Payer: Medicare HMO | Source: Ambulatory Visit | Attending: Nephrology | Admitting: Nephrology

## 2019-07-28 DIAGNOSIS — I776 Arteritis, unspecified: Secondary | ICD-10-CM | POA: Diagnosis not present

## 2019-07-28 MED ORDER — ACETAMINOPHEN 325 MG PO TABS: 650.0000 mg | ORAL_TABLET | Freq: Once | ORAL | Status: AC

## 2019-07-28 MED ORDER — ACETAMINOPHEN 325 MG PO TABS
ORAL_TABLET | ORAL | Status: AC
  Administered 2019-07-28: 650 mg via ORAL
  Filled 2019-07-28: qty 2

## 2019-07-28 MED ORDER — SODIUM CHLORIDE 0.9 % IV SOLN
500.0000 mg | Freq: Once | INTRAVENOUS | Status: AC
  Administered 2019-07-28: 500 mg via INTRAVENOUS
  Filled 2019-07-28: qty 50

## 2019-07-28 MED ORDER — METHYLPREDNISOLONE SODIUM SUCC 125 MG IJ SOLR
INTRAMUSCULAR | Status: AC
  Administered 2019-07-28: 125 mg via INTRAVENOUS
  Filled 2019-07-28: qty 2

## 2019-07-28 MED ORDER — DIPHENHYDRAMINE HCL 50 MG/ML IJ SOLN
INTRAMUSCULAR | Status: AC
  Administered 2019-07-28: 25 mg via INTRAVENOUS
  Filled 2019-07-28: qty 1

## 2019-07-28 MED ORDER — METHYLPREDNISOLONE SODIUM SUCC 125 MG IJ SOLR: 125.0000 mg | Freq: Once | INTRAMUSCULAR | Status: AC

## 2019-07-28 MED ORDER — DIPHENHYDRAMINE HCL 50 MG/ML IJ SOLN: 25.0000 mg | Freq: Once | INTRAMUSCULAR | Status: AC

## 2019-08-26 ENCOUNTER — Ambulatory Visit: Payer: Medicare HMO | Admitting: Family Medicine

## 2019-10-05 ENCOUNTER — Other Ambulatory Visit: Payer: Self-pay

## 2019-10-05 MED ORDER — GLIPIZIDE 5 MG PO TABS: 2.5000 mg | ORAL_TABLET | Freq: Two times a day (BID) | ORAL | 0 refills | Status: DC

## 2019-10-15 ENCOUNTER — Telehealth (INDEPENDENT_AMBULATORY_CARE_PROVIDER_SITE_OTHER): Payer: Medicare HMO | Admitting: Family Medicine

## 2019-10-15 ENCOUNTER — Encounter: Payer: Self-pay | Admitting: Family Medicine

## 2019-10-15 VITALS — BP 126/74 | HR 66 | Temp 97.3°F | Ht <= 58 in | Wt 195.0 lb

## 2019-10-15 DIAGNOSIS — I776 Arteritis, unspecified: Secondary | ICD-10-CM

## 2019-10-15 DIAGNOSIS — M1712 Unilateral primary osteoarthritis, left knee: Secondary | ICD-10-CM | POA: Diagnosis not present

## 2019-10-15 DIAGNOSIS — E782 Mixed hyperlipidemia: Secondary | ICD-10-CM

## 2019-10-15 DIAGNOSIS — E785 Hyperlipidemia, unspecified: Secondary | ICD-10-CM

## 2019-10-15 DIAGNOSIS — E1169 Type 2 diabetes mellitus with other specified complication: Secondary | ICD-10-CM | POA: Diagnosis not present

## 2019-10-15 DIAGNOSIS — J301 Allergic rhinitis due to pollen: Secondary | ICD-10-CM

## 2019-10-15 DIAGNOSIS — I7782 Antineutrophilic cytoplasmic antibody (ANCA) vasculitis: Secondary | ICD-10-CM

## 2019-10-15 MED ORDER — SULFAMETHOXAZOLE-TRIMETHOPRIM 800-160 MG PO TABS
ORAL_TABLET | ORAL | 0 refills | Status: DC
Start: 2019-10-15 — End: 2020-01-17

## 2019-10-15 MED ORDER — TRIAMCINOLONE ACETONIDE 40 MG/ML IJ SUSP: 80.0000 mg | Freq: Once | INTRAMUSCULAR | Status: DC

## 2019-10-15 NOTE — Patient Instructions (Signed)
Will refill crestor after lab results are received.

## 2019-10-15 NOTE — Progress Notes (Signed)
Chief Complaint:  cough  History of Present Illness:    Patient is a 69 year old [name] who presents for follow-up of diabetes, hyperlipidemia, and vasculitis.  C ANCA vasculitis with renal insufficiency.  She also had pulmonary issues related to the vasculitis.  She is currently following with nephrology and pulmonology.   I am following her diabetes. She is rarely.  She is working on eating healthy and exercising.  Denies falls.  She does check her feet every day.  She is currently taking glipizide 5 mg 1/2 tablet in the morning and a whole tablet before bed.  For this C ANCA vasculitis she has been doing [name] every 6 months.  She has to do this for another year.  In addition she is on Bactrim DS 1 tablet 3 times weekly.    For her hyperlipidemia she currently takes Crestor 10 mg once a day.  She had significant problems with swelling for period of time and is taking chlorthalidone 25 mg once daily.   Cough- started two days ago. Fell asleep with bedroom opened, no fever, chills, sob, diarrhea, cough is not productive, no H/A.  Left knee pain, patient started complaining of left knee pain 2 weeks ago. Has had steroid shots previously. Has OA left knee.   Past Medical History:  Diagnosis Date  . Chronic kidney disease   . Diabetes mellitus without complication ([place])   . Vasculitis ([place])   . 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)     Past Surgical History:  Procedure Laterality Date  . VIDEO BRONCHOSCOPY Bilateral 06/18/2018   Procedure: VIDEO BRONCHOSCOPY WITH FLUORO;  Surgeon: Rigoberto Noel, MD;  Location: [facility];  Service: Cardiopulmonary;  Laterality: Bilateral;  . VIDEO BRONCHOSCOPY WITH ENDOBRONCHIAL ULTRASOUND N/A 06/25/2018   Procedure: VIDEO BRONCHOSCOPY WITH ENDOBRONCHIAL ULTRASOUND;  Surgeon: Rigoberto Noel, MD;  Location: [facility];  Service: Thoracic;  Laterality: N/A;    Family History  Problem Relation Age of Onset  . Uterine cancer Mother     Social  History   Socioeconomic History  . Marital status: Single    Spouse name: Not on file  . Number of children: Not on file  . Years of education: Not on file  . Highest education level: Not on file  Occupational History  . Not on file  Tobacco Use  . Smoking status: Never Smoker  . Smokeless tobacco: Never Used  Substance and Sexual Activity  . Alcohol use: Never  . Drug use: Never  . Sexual activity: Not Currently  Other Topics Concern  . Not on file  Social History Narrative   Patient primary language is spanish, she lives at home with her son [name] and daughter in law [name]    Social Determinants of Health   Financial Resource Strain:   . Difficulty of Paying Living Expenses: Not on file  Food Insecurity:   . Worried About Charity fundraiser in the Last Year: Not on file  . Ran Out of Food in the Last Year: Not on file  Transportation Needs:   . Lack of Transportation (Medical): Not on file  . Lack of Transportation (Non-Medical): Not on file  Physical Activity:   . Days of Exercise per Week: Not on file  . Minutes of Exercise per Session: Not on file  Stress:   . Feeling of Stress : Not on file  Social Connections:   . Frequency of Communication with Friends and Family: Not on file  . Frequency  of Social Gatherings with Friends and Family: Not on file  . Attends Religious Services: Not on file  . Active Member of Clubs or Organizations: Not on file  . Attends Archivist Meetings: Not on file  . Marital Status: Not on file  Intimate Partner Violence:   . Fear of Current or Ex-Partner: Not on file  . Emotionally Abused: Not on file  . Physically Abused: Not on file  . Sexually Abused: Not on file    Outpatient Medications Prior to Visit  Medication Sig Dispense Refill  . sulfamethoxazole-trimethoprim (BACTRIM DS) 800-160 MG tablet TAKE 1 TABLET BY MOUTH ON MONDAY, WEDNESDAY AND FRIDAY    . ACCU-CHEK AVIVA PLUS test strip     .  Accu-Chek Softclix Lancets lancets USE TO CHECK BLOOD SUGAR TWICE A DAY E11.69 100 each 2  . chlorthalidone (HYGROTON) 25 MG tablet Take 25 mg by mouth daily.    Marland Kitchen glipiZIDE (GLUCOTROL) 5 MG tablet Take 0.5 tablets (2.5 mg total) by mouth 2 (two) times daily before a meal. 1/2 tablet in am and 1 tablet in pm 180 tablet 0  . rosuvastatin (CRESTOR) 10 MG tablet Take 1 tablet by mouth once daily 90 tablet 0  . triamcinolone cream (KENALOG) 0.1 % Apply 1 application topically 2 (two) times daily. 45 g 3   Facility-Administered Medications Prior to Visit  Medication Dose Route Frequency Provider Last Rate Last Admin  . triamcinolone acetonide (KENALOG-40) injection 40 mg  40 mg Intra-articular Once Yulisa Chirico, Elnita Maxwell, MD       Allergies:   Penicillins and Tetracycline   Social History   Tobacco Use  . Smoking status: Never Smoker  . Smokeless tobacco: Never Used  Substance Use Topics  . Alcohol use: Never  . Drug use: Never     Review of Systems  Constitutional: Negative for chills, fever and malaise/fatigue.  HENT: Negative for ear pain, sinus pain and sore throat.   Respiratory: Positive for cough. Negative for shortness of breath.   Cardiovascular: Negative for chest pain.  Gastrointestinal: Negative for abdominal pain, diarrhea, nausea and vomiting.  Musculoskeletal: Positive for joint pain (Left knee). Negative for myalgias.  Neurological: Negative for headaches.    Labs/Other Tests and Data Reviewed:    Recent Labs: 05/25/2019: ALT 13; BUN 16; Creatinine, Ser 0.64; Hemoglobin 14.3; Platelets 426; Potassium 3.7; Sodium 143   Recent Lipid Panel Lab Results  Component Value Date/Time   CHOL 153 05/25/2019 11:56 AM   TRIG 171 (H) 05/25/2019 11:56 AM   HDL 57 05/25/2019 11:56 AM   CHOLHDL 2.7 05/25/2019 11:56 AM   LDLCALC 67 05/25/2019 11:56 AM    Wt Readings from Last 3 Encounters:  10/15/19 195 lb (88.5 kg)  07/28/19 187 lb (84.8 kg)  05/25/19 188 lb 9.6 oz (85.5 kg)      Objective:    Vital Signs:  BP 126/74   Pulse 66   Temp (!) 97.3 F (36.3 C)   Ht 4\' 8"  (1.422 m)   Wt 195 lb (88.5 kg)   SpO2 99%   BMI 43.72 kg/m    Physical Exam Vitals reviewed.  Constitutional:      General: She is not in acute distress.    Appearance: Normal appearance. She is obese.  HENT:     Right Ear: Tympanic membrane, ear canal and external ear normal.     Left Ear: Tympanic membrane, ear canal and external ear normal.     Nose: Congestion present.  Mouth/Throat:     Mouth: Mucous membranes are moist.     Pharynx: No oropharyngeal exudate or posterior oropharyngeal erythema.  Neck:     Thyroid: No thyroid mass.     Vascular: No carotid bruit.  Cardiovascular:     Rate and Rhythm: Normal rate and regular rhythm.     Pulses: Normal pulses.     Heart sounds: No murmur heard.   Pulmonary:     Effort: Pulmonary effort is normal.     Breath sounds: Normal breath sounds.  Abdominal:     General: Bowel sounds are normal.     Palpations: Abdomen is soft. There is no mass.     Tenderness: There is no abdominal tenderness.  Musculoskeletal:        General: Tenderness (with patellar apprehension. positive mcmurrays.) present. No swelling or deformity. Normal range of motion.  Skin:    General: Skin is warm and dry.  Neurological:     Mental Status: She is alert and oriented to person, place, and time.  Psychiatric:        Mood and Affect: Mood normal.        Behavior: Behavior normal.      ASSESSMENT & PLAN:   1. Primary OA of left knee Risks were discussed including bleeding, infection, increase in sugars if diabetic, atrophy at site of injection, and increased pain.  After consent was obtained, using sterile technique the left knee was prepped with betadine and alcohol.  Kenalog 80 mg and 5 ml plain Lidocaine was then injected and the needle withdrawn.  The procedure was well tolerated.   The patient is asked to continue to rest the joint for a few more  days before resuming regular activities.  It may be more painful for the first 1-2 days.  Watch for fever, or increased swelling or persistent pain in the joint. Call or return to clinic prn if such symptoms occur or there is failure to improve as anticipated. - triamcinolone acetonide (KENALOG-40) injection 80 mg  2. Mixed hyperlipidemia Well controlled.  No changes to medicines.  Continue to work on eating a healthy diet and exercise.  Labs drawn today.  - lipid panel  3. Dyslipidemia associated with type 2 diabetes mellitus (HCC) Control: fairly good, but worsened in the last 3 months. Recommend check feet daily. Recommend annual eye exams. Medicines: n ochanges. Continue to work on eating a healthy diet and exercise.  Labs drawn today.   - CBC with Differential/Platelet - Comprehensive metabolic panel - UMP5T 4. Seasonal allergic rhinitis due to pollen Recommend otc allergy medicines.  5. 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 Continue rituxin every 6 months until stopped by nephrology.  - sulfamethoxazole-trimethoprim (BACTRIM DS) 800-160 MG tablet; TAKE 1 TABLET BY MOUTH ON MONDAY, WEDNESDAY AND FRIDAY  Dispense: 40 tablet; Refill: 0 - Cardiovascular Risk Assessment  Meds ordered this encounter  Medications  . sulfamethoxazole-trimethoprim (BACTRIM DS) 800-160 MG tablet    Sig: TAKE 1 TABLET BY MOUTH ON MONDAY, WEDNESDAY AND FRIDAY    Dispense:  40 tablet    Refill:  0  . triamcinolone acetonide (KENALOG-40) injection 80 mg    COVID-19 Education: The signs and symptoms of COVID-19 were discussed with the patient and how to seek care for testing (follow up with PCP or arrange E-visit). The importance of social distancing was discussed today.  Follow Up:  3 months fasting  Signed, [name], MD  10/15/2019 10:06 AM    [facility]

## 2019-10-16 LAB — COMPREHENSIVE METABOLIC PANEL
ALT: 24 IU/L (ref 0–32)
AST: 19 IU/L (ref 0–40)
Albumin/Globulin Ratio: 1.6 (ref 1.2–2.2)
Albumin: 4.1 g/dL (ref 3.8–4.8)
Alkaline Phosphatase: 130 IU/L — ABNORMAL HIGH (ref 44–121)
BUN/Creatinine Ratio: 22 (ref 12–28)
BUN: 16 mg/dL (ref 8–27)
Bilirubin Total: 0.3 mg/dL (ref 0.0–1.2)
CO2: 24 mmol/L (ref 20–29)
Calcium: 9.7 mg/dL (ref 8.7–10.3)
Chloride: 103 mmol/L (ref 96–106)
Creatinine, Ser: 0.73 mg/dL (ref 0.57–1.00)
GFR calc Af Amer: 97 mL/min/{1.73_m2} (ref >59)
GFR calc non Af Amer: 84 mL/min/{1.73_m2} (ref >59)
Globulin, Total: 2.6 g/dL (ref 1.5–4.5)
Glucose: 99 mg/dL (ref 65–99)
Potassium: 3.8 mmol/L (ref 3.5–5.2)
Sodium: 140 mmol/L (ref 134–144)
Total Protein: 6.7 g/dL (ref 6.0–8.5)

## 2019-10-16 LAB — CBC WITH DIFFERENTIAL/PLATELET
Basophils Absolute: 0.1 10*3/uL (ref 0.0–0.2)
Basos: 1 %
EOS (ABSOLUTE): 0.4 10*3/uL (ref 0.0–0.4)
Eos: 3 %
Hematocrit: 43.9 % (ref 34.0–46.6)
Hemoglobin: 14.6 g/dL (ref 11.1–15.9)
Immature Grans (Abs): 0 10*3/uL (ref 0.0–0.1)
Immature Granulocytes: 0 %
Lymphocytes Absolute: 5.3 10*3/uL — ABNORMAL HIGH (ref 0.7–3.1)
Lymphs: 44 %
MCH: 29.1 pg (ref 26.6–33.0)
MCHC: 33.3 g/dL (ref 31.5–35.7)
MCV: 88 fL (ref 79–97)
Monocytes Absolute: 0.7 10*3/uL (ref 0.1–0.9)
Monocytes: 6 %
Neutrophils Absolute: 5.5 10*3/uL (ref 1.4–7.0)
Neutrophils: 46 %
Platelets: 394 10*3/uL (ref 150–450)
RBC: 5.02 x10E6/uL (ref 3.77–5.28)
RDW: 13.5 % (ref 11.7–15.4)
WBC: 11.9 10*3/uL — ABNORMAL HIGH (ref 3.4–10.8)

## 2019-10-16 LAB — LIPID PANEL
Chol/HDL Ratio: 2.8 ratio (ref 0.0–4.4)
Cholesterol, Total: 140 mg/dL (ref 100–199)
HDL: 50 mg/dL (ref >39)
LDL Chol Calc (NIH): 62 mg/dL (ref 0–99)
Triglycerides: 168 mg/dL — ABNORMAL HIGH (ref 0–149)
VLDL Cholesterol Cal: 28 mg/dL (ref 5–40)

## 2019-10-16 LAB — CARDIOVASCULAR RISK ASSESSMENT

## 2019-10-16 LAB — HEMOGLOBIN A1C
Est. average glucose Bld gHb Est-mCnc: 163 mg/dL
Hgb A1c MFr Bld: 7.3 % — ABNORMAL HIGH (ref 4.8–5.6)

## 2019-10-22 ENCOUNTER — Encounter: Payer: Self-pay | Admitting: Family Medicine

## 2019-10-22 ENCOUNTER — Ambulatory Visit (INDEPENDENT_AMBULATORY_CARE_PROVIDER_SITE_OTHER): Payer: Medicare HMO

## 2019-10-22 DIAGNOSIS — Z23 Encounter for immunization: Secondary | ICD-10-CM | POA: Diagnosis not present

## 2019-10-22 NOTE — Progress Notes (Signed)
    Patient Name: Julia Johns Patient DOB: 12-01-1950  10/22/2019  Julia Johns came in today for her 2021-2022 influenza vaccine.  Patient tolerated it well.   Erie Noe

## 2019-10-24 ENCOUNTER — Encounter: Payer: Self-pay | Admitting: Family Medicine

## 2019-10-24 MED ORDER — ROSUVASTATIN CALCIUM 10 MG PO TABS
10.0000 mg | ORAL_TABLET | Freq: Every day | ORAL | 1 refills | Status: DC
Start: 2019-10-24 — End: 2019-10-25

## 2019-10-25 ENCOUNTER — Other Ambulatory Visit: Payer: Self-pay | Admitting: Legal Medicine

## 2019-11-01 DIAGNOSIS — E559 Vitamin D deficiency, unspecified: Secondary | ICD-10-CM | POA: Diagnosis not present

## 2019-11-01 DIAGNOSIS — N179 Acute kidney failure, unspecified: Secondary | ICD-10-CM | POA: Diagnosis not present

## 2019-11-03 DIAGNOSIS — I776 Arteritis, unspecified: Secondary | ICD-10-CM | POA: Diagnosis not present

## 2019-11-03 DIAGNOSIS — N189 Chronic kidney disease, unspecified: Secondary | ICD-10-CM | POA: Diagnosis not present

## 2019-11-03 DIAGNOSIS — D631 Anemia in chronic kidney disease: Secondary | ICD-10-CM | POA: Diagnosis not present

## 2019-11-03 DIAGNOSIS — I10 Essential (primary) hypertension: Secondary | ICD-10-CM | POA: Diagnosis not present

## 2019-12-20 ENCOUNTER — Telehealth: Payer: Self-pay | Admitting: Pulmonary Disease

## 2019-12-20 NOTE — Telephone Encounter (Signed)
Called and spoke with pt's son Julia Johns who states pt  Has had a cough off and on now x2 months. Julia Johns states that pt's cough is worse at night. Pt has been having to clear her thoat often due to having some irritation. Julia Johns stated that pt went to see PCP and was told that symptoms were due to allergies and they gave her some meds to take. Pt has also been taking nyquil at night to help with symptoms.  Stated to Julia Johns that we should get pt scheduled for a visit and he verbalized understanding. appt scheduled with Julia Johns Wed. 12/1. Nothing further needed.

## 2019-12-22 ENCOUNTER — Encounter: Payer: Self-pay | Admitting: Primary Care

## 2019-12-22 ENCOUNTER — Ambulatory Visit (INDEPENDENT_AMBULATORY_CARE_PROVIDER_SITE_OTHER): Payer: Medicare HMO

## 2019-12-22 ENCOUNTER — Ambulatory Visit (INDEPENDENT_AMBULATORY_CARE_PROVIDER_SITE_OTHER): Payer: Medicare HMO | Admitting: Primary Care

## 2019-12-22 ENCOUNTER — Other Ambulatory Visit: Payer: Self-pay

## 2019-12-22 VITALS — BP 130/72 | HR 90 | Temp 97.3°F | Ht <= 58 in | Wt 193.4 lb

## 2019-12-22 DIAGNOSIS — R053 Chronic cough: Secondary | ICD-10-CM | POA: Insufficient documentation

## 2019-12-22 DIAGNOSIS — R059 Cough, unspecified: Secondary | ICD-10-CM | POA: Diagnosis not present

## 2019-12-22 DIAGNOSIS — J9811 Atelectasis: Secondary | ICD-10-CM | POA: Diagnosis not present

## 2019-12-22 DIAGNOSIS — Z6841 Body Mass Index (BMI) 40.0 and over, adult: Secondary | ICD-10-CM | POA: Diagnosis not present

## 2019-12-22 DIAGNOSIS — I517 Cardiomegaly: Secondary | ICD-10-CM | POA: Diagnosis not present

## 2019-12-22 DIAGNOSIS — E119 Type 2 diabetes mellitus without complications: Secondary | ICD-10-CM | POA: Diagnosis not present

## 2019-12-22 MED ORDER — FAMOTIDINE 20 MG PO TABS: 20.0000 mg | ORAL_TABLET | Freq: Every day | ORAL | 1 refills | Status: DC

## 2019-12-22 MED ORDER — OMEPRAZOLE 20 MG PO CPDR: 20.0000 mg | DELAYED_RELEASE_CAPSULE | Freq: Every day | ORAL | 1 refills | Status: DC

## 2019-12-22 MED ORDER — FLUTICASONE PROPIONATE 50 MCG/ACT NA SUSP: 1.0000 | Freq: Every day | NASAL | 1 refills | Status: DC

## 2019-12-22 NOTE — Progress Notes (Signed)
@Patient  ID: Julia Johns, female    DOB: May 26, 1950, 69 y.o.   MRN: [id number]  Chief Complaint  Patient presents with  . Follow-up    Lung mass, dry cough    Referring provider: [name], MD  HPI: 69 year old female, never smoked. PMH significant for chronic respiratory failure, dysphagia, ANCA vasculitis, hx hemoptysis, lung mass, GERD, type 2 diabetes, CKD, heart burn, hyperlipidemia, obesity. Patient of Dr. [name], last seen in office on 03/15/19. Patient was diagnosed with ANCA associated back vasculitis contributing to worsening kidney function. She received 4 weeks of [name] and is not on every 6 month schedule. She did not tolerate prednisone and this was tapered off. Nocturnal oxygen was discontinued. Patient did not wish to pursue home sleep test.   12/22/2019 Patient presents today for acute visit. She reports dry cough x 2 month, worse at night. She reports some associated chest tightness with coughing fits. She also complains of dry/itchy throat, PND and HA. Priimary care started her on allergy medication which did help for a period of time. Denies shortness of breath or wheezing.   Allergies  Allergen Reactions  . Penicillins Anaphylaxis    Patient stated that within a few minutes of receiving a penicillin IM injection she rapidly became unconscious and required "additional" medication to recover. This occurred when she was ~69 years old and she does not recall what was given to assist in her recovery. She did not develop a rash.   . Tetracycline Anaphylaxis    Loss of consciousness after injection     Immunization History  Administered Date(s) Administered  . Fluad Quad(high Dose 65+) 10/22/2019  . Influenza-Unspecified 10/23/2017  . PFIZER SARS-COV-2 Vaccination 04/15/2019, 05/10/2019    Past Medical History:  Diagnosis Date  . Chronic kidney disease   . Diabetes mellitus without complication ([place])   . Vasculitis ([place])   . Vasculitis, ANCA positive  ([facility])     Tobacco History: Social History   Tobacco Use  Smoking Status Never Smoker  Smokeless Tobacco Never Used   Counseling given: Not Answered   Outpatient Medications Prior to Visit  Medication Sig Dispense Refill  . ACCU-CHEK AVIVA PLUS test strip     . Accu-Chek Softclix Lancets lancets USE TO CHECK BLOOD SUGAR TWICE A DAY E11.69 100 each 2  . chlorthalidone (HYGROTON) 25 MG tablet Take 25 mg by mouth daily.    Marland Kitchen glipiZIDE (GLUCOTROL) 5 MG tablet Take 0.5 tablets (2.5 mg total) by mouth 2 (two) times daily before a meal. 1/2 tablet in am and 1 tablet in pm 180 tablet 0  . rosuvastatin (CRESTOR) 10 MG tablet Take 1 tablet by mouth once daily 90 tablet 2  . sulfamethoxazole-trimethoprim (BACTRIM DS) 800-160 MG tablet TAKE 1 TABLET BY MOUTH ON MONDAY, WEDNESDAY AND FRIDAY 40 tablet 0  . triamcinolone cream (KENALOG) 0.1 % Apply 1 application topically 2 (two) times daily. 45 g 3   Facility-Administered Medications Prior to Visit  Medication Dose Route Frequency Provider Last Rate Last Admin  . triamcinolone acetonide (KENALOG-40) injection 80 mg  80 mg Intramuscular Once Cox, Elnita Maxwell, MD          Review of Systems  Review of Systems  Constitutional: Negative for chills and fever.  HENT: Positive for postnasal drip.   Respiratory: Positive for cough and chest tightness. Negative for shortness of breath and wheezing.      Physical Exam  BP 130/72 (BP Location: Left Arm, Cuff Size: Normal)  Pulse 90   Temp (!) 97.3 F (36.3 C) (Oral)   Ht 4\' 8"  (1.422 m)   Wt 193 lb 6.4 oz (87.7 kg)   SpO2 98%   BMI 43.36 kg/m  Physical Exam Constitutional:      General: She is not in acute distress.    Appearance: Normal appearance. She is obese. She is not ill-appearing.  HENT:     Mouth/Throat:     Mouth: Mucous membranes are moist.     Pharynx: Oropharynx is clear. No oropharyngeal exudate or posterior oropharyngeal erythema.  Cardiovascular:     Rate and Rhythm:  Normal rate and regular rhythm.     Comments: No edema Pulmonary:     Effort: Pulmonary effort is normal.     Breath sounds: Normal breath sounds.  Musculoskeletal:        General: Normal range of motion.  Skin:    General: Skin is warm and dry.  Neurological:     General: No focal deficit present.     Mental Status: She is alert and oriented to person, place, and time. Mental status is at baseline.  Psychiatric:        Mood and Affect: Mood normal.        Behavior: Behavior normal.        Thought Content: Thought content normal.        Judgment: Judgment normal.      Lab Results:  CBC    Component Value Date/Time   WBC 11.9 (H) 10/15/2019 1010   WBC 14.3 (H) 12/27/2018 0517   RBC 5.02 10/15/2019 1010   RBC 4.62 12/27/2018 0517   HGB 14.6 10/15/2019 1010   HCT 43.9 10/15/2019 1010   PLT 394 10/15/2019 1010   MCV 88 10/15/2019 1010   MCH 29.1 10/15/2019 1010   MCH 28.8 12/27/2018 0517   MCHC 33.3 10/15/2019 1010   MCHC 32.5 12/27/2018 0517   RDW 13.5 10/15/2019 1010   LYMPHSABS 5.3 (H) 10/15/2019 1010   MONOABS 0.5 12/27/2018 0517   EOSABS 0.4 10/15/2019 1010   BASOSABS 0.1 10/15/2019 1010    BMET    Component Value Date/Time   NA 140 10/15/2019 1010   K 3.8 10/15/2019 1010   CL 103 10/15/2019 1010   CO2 24 10/15/2019 1010   GLUCOSE 99 10/15/2019 1010   GLUCOSE 193 (H) 12/27/2018 0517   BUN 16 10/15/2019 1010   CREATININE 0.73 10/15/2019 1010   CALCIUM 9.7 10/15/2019 1010   GFRNONAA 84 10/15/2019 1010   GFRAA 97 10/15/2019 1010    BNP    Component Value Date/Time   BNP 344.1 (H) 07/21/2018 1101    ProBNP    Component Value Date/Time   PROBNP 155 09/25/2018 0957    Imaging: No results found.   Assessment & Plan:   Chronic cough - Patient reports dry cough x 2 months, mainly at night. She does not have any shortness of breath. PND and GERD likely contributing. Starting Flonase nasal spray, omeprazole 20mg  daily and pepcid 20mg  at bedtime.  Continue OTC antihistamine. Checking CXR today d/t hx of vasculitis. FU in 4 weeks. If not better consider getting PFTs with [facility].      Martyn Ehrich, NP 12/22/2019

## 2019-12-22 NOTE — Assessment & Plan Note (Addendum)
-   Patient reports dry cough x 2 months, mainly at night. She does not have any shortness of breath. PND and GERD likely contributing. Starting Flonase nasal spray, omeprazole 20mg  daily and pepcid 20mg  at bedtime. Continue OTC antihistamine. Checking CXR today d/t hx of vasculitis. FU in 4 weeks. If not better consider getting PFTs with [facility].

## 2019-12-22 NOTE — Patient Instructions (Addendum)
Recommendations: - Start Flonase nasal spray once daily - Start omeprazole 20mg  in the morning and pepcid 20mg  in the evening  - Use sugar-free lozengers and sips of water to help with cough (avoid mint or menthol products) - Continue allergy medication   Orders: - CXR today re: cough   Follow-up: - 4-6 weeks with Dr. [name] or Eustaquio Maize NP

## 2019-12-22 NOTE — Progress Notes (Signed)
Patient CXR was stable. She has cardiomegaly (enlarged heart) this is similar to prior imaging. Atelectasis/Scarring at lung bases, again similar to prior. Encourage deep breathing exercises.

## 2020-01-17 ENCOUNTER — Other Ambulatory Visit: Payer: Self-pay

## 2020-01-17 ENCOUNTER — Encounter: Payer: Self-pay | Admitting: Family Medicine

## 2020-01-17 ENCOUNTER — Ambulatory Visit (INDEPENDENT_AMBULATORY_CARE_PROVIDER_SITE_OTHER): Payer: Medicare HMO | Admitting: Family Medicine

## 2020-01-17 VITALS — BP 124/68 | HR 74 | Temp 97.3°F | Resp 16 | Ht <= 58 in | Wt 191.6 lb

## 2020-01-17 DIAGNOSIS — E782 Mixed hyperlipidemia: Secondary | ICD-10-CM | POA: Diagnosis not present

## 2020-01-17 DIAGNOSIS — M318 Other specified necrotizing vasculopathies: Secondary | ICD-10-CM | POA: Diagnosis not present

## 2020-01-17 DIAGNOSIS — Z6841 Body Mass Index (BMI) 40.0 and over, adult: Secondary | ICD-10-CM | POA: Diagnosis not present

## 2020-01-17 DIAGNOSIS — M25561 Pain in right knee: Secondary | ICD-10-CM | POA: Diagnosis not present

## 2020-01-17 DIAGNOSIS — R059 Cough, unspecified: Secondary | ICD-10-CM | POA: Diagnosis not present

## 2020-01-17 DIAGNOSIS — E785 Hyperlipidemia, unspecified: Secondary | ICD-10-CM

## 2020-01-17 DIAGNOSIS — I7782 Antineutrophilic cytoplasmic antibody (ANCA) vasculitis: Secondary | ICD-10-CM

## 2020-01-17 DIAGNOSIS — E1169 Type 2 diabetes mellitus with other specified complication: Secondary | ICD-10-CM | POA: Diagnosis not present

## 2020-01-17 DIAGNOSIS — J301 Allergic rhinitis due to pollen: Secondary | ICD-10-CM

## 2020-01-17 MED ORDER — SULFAMETHOXAZOLE-TRIMETHOPRIM 800-160 MG PO TABS: ORAL_TABLET | ORAL | 0 refills | Status: DC

## 2020-01-17 MED ORDER — FLUTICASONE PROPIONATE 50 MCG/ACT NA SUSP: 1.0000 | Freq: Every day | NASAL | 2 refills | Status: DC

## 2020-01-17 NOTE — Progress Notes (Signed)
Subjective:  Patient ID: Julia Johns, female    DOB: [birth year]  Age: 69 y.o. MRN: [id number]  Chief Complaint  Patient presents with  . Diabetes  . Hyperlipidemia    HPI Diabetes: Patient is currently not checking her sugars. She does try to eat healthy and has cut down on the rice and the tortillas. She does exercise and is active. Currently taking glipizide 5 mg 1/2 tablet in the morning and hold tablet before supper. Cough: Patient was seen by pulmonology 1 month ago. She was put on omeprazole in hopes this would help her cough. It has not made any difference. She reports feeling as if when she was previously on Flonase and allergy medicine that this seemed to help more.  Hyperlipidemia: Currently patient is on Crestor 10 mg once daily. Eat low-fat. 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 This is resolved to normal with immunotherapy Frances Maywood [name] every 6 months.)  She is also maintained on sulfamethoxazole which she needs a refill. She is takes this 3 times per week. Patient sees pulmonology, nephrology and rheumatology.  Current Outpatient Medications on File Prior to Visit  Medication Sig Dispense Refill  . ACCU-CHEK AVIVA PLUS test strip     . Accu-Chek Softclix Lancets lancets USE TO CHECK BLOOD SUGAR TWICE A DAY E11.69 100 each 2  . chlorthalidone (HYGROTON) 25 MG tablet Take 12.5 mg by mouth daily.    Marland Kitchen glipiZIDE (GLUCOTROL) 5 MG tablet Take 0.5 tablets (2.5 mg total) by mouth 2 (two) times daily before a meal. 1/2 tablet in am and 1 tablet in pm 180 tablet 0  . omeprazole (PRILOSEC) 20 MG capsule Take 1 capsule (20 mg total) by mouth daily. 30 capsule 1  . rosuvastatin (CRESTOR) 10 MG tablet Take 1 tablet by mouth once daily 90 tablet 2  . triamcinolone cream (KENALOG) 0.1 % Apply 1 application topically 2 (two) times daily. 45 g 3   Current Facility-Administered Medications on File Prior to Visit  Medication Dose Route Frequency Provider  Last Rate Last Admin  . triamcinolone acetonide (KENALOG-40) injection 80 mg  80 mg Intramuscular Once [name], MD       Past Medical History:  Diagnosis Date  . Chronic kidney disease   . Diabetes mellitus without complication ([place])   . Vasculitis ([place])   . 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 VIDEO BRONCHOSCOPY Bilateral 06/18/2018   Procedure: VIDEO BRONCHOSCOPY WITH FLUORO;  Surgeon: Rigoberto Noel, MD;  Location: [facility];  Service: Cardiopulmonary;  Laterality: Bilateral;  . VIDEO BRONCHOSCOPY WITH ENDOBRONCHIAL ULTRASOUND N/A 06/25/2018   Procedure: VIDEO BRONCHOSCOPY WITH ENDOBRONCHIAL ULTRASOUND;  Surgeon: Rigoberto Noel, MD;  Location: [facility];  Service: Thoracic;  Laterality: N/A;    Family History  Problem Relation Age of Onset  . Uterine cancer Mother    Social History   Socioeconomic History  . Marital status: Single    Spouse name: Not on file  . Number of children: Not on file  . Years of education: Not on file  . Highest education level: Not on file  Occupational History  . Not on file  Tobacco Use  . Smoking status: Never Smoker  . Smokeless tobacco: Never Used  Substance and Sexual Activity  . Alcohol use: Never  . Drug use: Never  . Sexual activity: Not Currently  Other Topics Concern  . Not on file  Social History Narrative  Patient primary language is spanish, she lives at home with her son [name] and daughter in law [name]    Social Determinants of Health   Financial Resource Strain: Not on file  Food Insecurity: Not on file  Transportation Needs: Not on file  Physical Activity: Not on file  Stress: Not on file  Social Connections: Not on file    Review of Systems  Constitutional: Negative for chills, fatigue and fever.  HENT: Positive for congestion and rhinorrhea. Negative for sore throat.   Respiratory: Positive for cough. Negative for shortness of breath.    Cardiovascular: Negative for chest pain.  Gastrointestinal: Negative for abdominal pain, constipation, diarrhea, nausea and vomiting.  Genitourinary: Negative for dysuria and urgency.  Musculoskeletal: Positive for arthralgias (Right posterior knee. She was doing yard work approximately 1 month ago and the pain began. Has not resolved.). Negative for back pain and myalgias.  Neurological: Positive for headaches. Negative for dizziness, weakness and light-headedness.  Psychiatric/Behavioral: Negative for dysphoric mood. The patient is not nervous/anxious.      Objective:  BP 124/68   Pulse 74   Temp (!) 97.3 F (36.3 C)   Resp 16   Ht 4\' 9"  (1.448 m)   Wt 191 lb 9.6 oz (86.9 kg)   BMI 41.46 kg/m   BP/Weight 01/17/2020 12/22/2019 04/29/1446  Systolic BP 185 631 497  Diastolic BP 68 72 74  Wt. (Lbs) 191.6 193.4 195  BMI 41.46 43.36 43.72    Physical Exam Vitals reviewed.  Constitutional:      Appearance: Normal appearance. She is normal weight.  HENT:     Right Ear: Tympanic membrane, ear canal and external ear normal.     Left Ear: Tympanic membrane, ear canal and external ear normal.     Nose: Congestion and rhinorrhea present.     Mouth/Throat:     Pharynx: Oropharynx is clear. No posterior oropharyngeal erythema.  Neck:     Vascular: No carotid bruit.  Cardiovascular:     Rate and Rhythm: Normal rate and regular rhythm.     Pulses: Normal pulses.     Heart sounds: Normal heart sounds. No murmur heard.   Pulmonary:     Effort: Pulmonary effort is normal. No respiratory distress.     Breath sounds: Normal breath sounds.  Abdominal:     General: Abdomen is flat. Bowel sounds are normal.     Palpations: Abdomen is soft.     Tenderness: There is no abdominal tenderness.  Musculoskeletal:        General: Tenderness (Posterior right knee) present.  Neurological:     Mental Status: She is alert and oriented to person, place, and time.  Psychiatric:        Mood and  Affect: Mood normal.        Behavior: Behavior normal.     Diabetic Foot Exam - Simple   Simple Foot Form Diabetic Foot exam was performed with the following findings: Yes 01/17/2020 10:40 AM  Visual Inspection See comments: Yes Sensation Testing Intact to touch and monofilament testing bilaterally: Yes Pulse Check Posterior Tibialis and Dorsalis pulse intact bilaterally: Yes Comments [place] BL.       Lab Results  Component Value Date   WBC 11.9 (H) 10/15/2019   HGB 14.6 10/15/2019   HCT 43.9 10/15/2019   PLT 394 10/15/2019   GLUCOSE 99 10/15/2019   CHOL 140 10/15/2019   TRIG 168 (H) 10/15/2019   HDL 50 10/15/2019   [place]  62 10/15/2019   ALT 24 10/15/2019   AST 19 10/15/2019   NA 140 10/15/2019   K 3.8 10/15/2019   CL 103 10/15/2019   CREATININE 0.73 10/15/2019   BUN 16 10/15/2019   CO2 24 10/15/2019   INR 1.2 07/05/2018   HGBA1C 7.3 (H) 10/15/2019      Assessment & Plan:   1. Dyslipidemia associated with type 2 diabetes mellitus ([place]) Control: unclear. Will await labs. Recommend check feet daily. Recommend annual eye exams. Medicines: no changes at this time. Will await labs. Continue to work on eating a healthy diet and exercise.  Labs drawn today.   - CBC with Differential/Platelet - Comprehensive metabolic panel - Hemoglobin A1c - Lipid panel Patient needs a microalbumin on Wednesday when she comes for her annual wellness visit.  2. Seasonal allergic rhinitis due to pollen Start on OTC antihistamines (Allegra, Claritin, or Zyrtec.) Start Flonase. Stop omeprazole. - fluticasone (FLONASE) 50 MCG/ACT nasal spray; Place 1 spray into both nostrils daily.  Dispense: 16 g; Refill: 2  3. 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 - sulfamethoxazole-trimethoprim (BACTRIM DS) 800-160 MG tablet; TAKE 1 TABLET BY MOUTH ON MONDAY, WEDNESDAY AND FRIDAY   Dispense: 40 tablet; Refill: 0  4. Cough Stop omeprazole. Start Flonase and OTC antihistamine. - fluticasone (FLONASE) 50 MCG/ACT nasal spray; Place 1 spray into both nostrils daily.  Dispense: 16 g; Refill: 2  5. Mixed hyperlipidemia Continue low fat diet.  Continue crestor. - Lipid panel  6. Morbid obesity with BMI of 40.0-44.9, adult ([place]) Education on diet and exercise given.  7. Acute pain of right knee Order right knee x-ray  Meds ordered this encounter  Medications  . sulfamethoxazole-trimethoprim (BACTRIM DS) 800-160 MG tablet    Sig: TAKE 1 TABLET BY MOUTH ON MONDAY, WEDNESDAY AND FRIDAY    Dispense:  40 tablet    Refill:  0  . fluticasone (FLONASE) 50 MCG/ACT nasal spray    Sig: Place 1 spray into both nostrils daily.    Dispense:  16 g    Refill:  2    Orders Placed This Encounter  Procedures  . CBC with Differential/Platelet  . Comprehensive metabolic panel  . Hemoglobin A1c  . Lipid panel    Follow-up: No follow-ups on file.  An After Visit Summary was printed and given to the patient.  [name], MD [facility] [phone]

## 2020-01-17 NOTE — Patient Instructions (Addendum)
Continue flonase nasal spray.  Start on OTC allergy medicine (zyrtec, allegra or claritin. Stop omeprazole.   Rinitis alrgica en adultos Allergic Rhinitis, Adult La rinitis alrgica es una reaccin a los alrgenos que se encuentran en el aire. Los alrgenos son las partculas minsculas que estn en el aire y que hacen que el cuerpo tenga una reaccin IT consultant. Esta afeccin no se puede transmitir de [place] persona a otra (no es contagiosa). La rinitis alrgica no se puede curar, pero puede controlarse. [employer] tipos de rinitis alrgica:  Psychologist, counselling. Este tipo tambin se denomina fiebre del heno. Sucede nicamente durante ciertas pocas del ao.  Perenne. Este tipo puede ocurrir en cualquier momento del ao. Cules son las causas? Esta afeccin puede ser causada por lo siguiente:  El polen que proviene de los rboles, el pasto y las [place].  caros del polvo en Engineer, mining.  Caspa de las [employer].  Moho. Cules son los signos o los sntomas? Los sntomas de esta afeccin incluyen:  Estornudos.  Nariz tapada o que gotea (congestin nasal).  Abundante mucosidad en la parte posterior de la garganta (goteo posnasal).  Escozor en la Lawyer.  Lagrimeo.  Dificultad para dormir.  Estar somnoliento Agricultural consultant. Cmo se trata? No hay cura para esta afeccin. Debe evitar las cosas que desencadenan sus sntomas (alrgenos). El tratamiento puede ayudar a [employer] sntomas. Puede incluir:  Medicamentos que [employer] Pont sntomas de la [place], [place] antihistamnicos. Estos pueden administrarse en forma de inyeccin, aerosol nasal o comprimidos.  Vacunas que se administran hasta que el cuerpo se vuelve menos sensible al alrgeno (desensibilizacin).  Medicamentos ms potentes, si todos los dems tratamientos no han sido eficaces. Siga estas indicaciones en su casa: Evite los alrgenos   Conozca a qu es Air cabin crew. Los alrgenos comunes incluyen el humo, polvo y  [place].  Evtelos si puede. Hay algunas medidas que puede tomar para evitar los alrgenos: ? [employer] alfombras por pisos de [place], baldosas o vinilo. Las alfombras pueden retener la caspa de los animales y [place]. ? Limpie cualquier moho que encuentre en la casa. ? No fume. No permita que fumen en su casa. ? Cambie el filtro de la calefaccin y del aire acondicionado al menos una vez al mes. ? Durante la temporada de alergias:  Mantenga las ventanas cerradas todo el tiempo posible. Si es posible, use aire acondicionado cuando hay mucho polen en el aire.  Use un filtro especial para alergias con la caldera y el aire acondicionado.  Planee actividades al aire libre cuando las concentraciones de polen estn en su nivel ms bajo. Normalmente, esto es por la maana temprano o durante las horas de la noche.  Si sale al aire libre cuando la concentracin de polen es [place], use una mscara especial para personas con Set designer.  Cuando vuelva al interior, dese una ducha y [place] de ropa antes de sentarse en los muebles o en la cama. Indicaciones generales  No use ventiladores en su hogar.  No cuelgue ropa en el exterior para que se seque.  Use gafas para el sol para mantener el polen alejado de los ojos.  Lvese las manos enseguida despus de tocar a las [employer].  Tome los medicamentos de venta libre y los recetados solamente como se lo haya indicado el mdico.  Consulting civil engineer a todas las visitas de control como se lo haya indicado el mdico. Esto es importante. Comunquese con un mdico si:  Tiene fiebre.  Tiene tos que no  desaparece (es persistente).  Comienza a emitir un sonido agudo al respirar (sibilancias).  Los sntomas no mejoran con Dispensing optician.  Le sale lquido espeso por la Lawyer.  Comienza a tener hemorragia nasal. Solicite ayuda inmediatamente si:  Tiene la boca o los labios hinchados.  Tiene dificultad para respirar.  Se siente mareado o como  si se fuera a desmayar.  Tiene transpiracin fra. Resumen  La rinitis alrgica es una reaccin a los alrgenos que se encuentran en el aire.  Esta afeccin puede ser causada por alrgenos. Estos [employer], los caros del polvo, la caspa de las mascotas y Building services engineer.  Los sntomas son goteo y picazn nasal, estornudos o Industrial/product designer. Tambin es posible que tenga dificultad para dormir o que sienta sueo Agricultural consultant.  El tratamiento incluye tomar medicamentos y Management consultant. Tambin es posible que deba recibir vacunas o tomar medicamentos ms potentes.  Solicite ayuda si tiene fiebre o tos que no se detiene. Solicite ayuda de inmediato si le falta el aire. Esta informacin no tiene Marine scientist el consejo del mdico. Asegrese de hacerle al mdico cualquier pregunta que tenga. Document Revised: 09/10/2017 Document Reviewed: 09/10/2017 Elsevier Patient Education  [place] [employer].  This is for you to review prior to your appointment on Wednesday.   Voluntades anticipadas Advance Directive  Las voluntades anticipadas son documentos legales que le permiten tomar decisiones por adelantado acerca de su tratamiento mdico y atencin de la salud en caso de no poder expresarse usted mismo. Son la forma de dar a Civil engineer, contracting sus deseos a familiares, amigos y mdicos. Esto puede informar a los Doctor, hospital de sus decisiones sobre los cuidados terminales en caso de que no pueda expresarse. La elaboracin y la redaccin de las voluntades anticipadas deben realizarse con el transcurso del [place], Engineer, technical sales de tomar todas las decisiones a la vez. Las voluntades anticipadas pueden modificarse segn su situacin y sus deseos, incluso despus de haberlas firmado. Hay diferentes tipos de voluntades anticipadas, por ejemplo:  Poder notarial mdico.  Testamento vital.  Orden de no reanimar (ONR) o de no Systems developer (ONIR). Apoderado para temas de salud y poder notarial mdico Al  apoderado para temas de salud tambin se le llama representante de temas de salud. Esta es una persona designada para tomar decisiones relacionadas con la salud en representacin suya en aquellos casos en los que usted no pueda tomar decisiones por usted mismo. Generalmente, la gente elige a personas a las que conoce bien y que son de confianza para representar sus preferencias. Asegrese de solicitar a esta persona un acuerdo para que acte como su apoderado. El apoderado puede tener que evaluar por s mismo en el caso de una decisin mdica para la cual no se conocen los deseos del [place]. El poder notarial mdico es un documento legal que nombra a su apoderado para temas de [place]. En funcin de las leyes de su [place], una vez escrito el documento, es posible que tambin deba estar:  Timoteo Ace.  Autenticado ante escribano.  Fechado.  Copiado.  Atestiguado.  Incorporado a la historia clnica del paciente. Recomendamos tambin asignar a alguien para que administre su dinero en una situacin en la que no pueda hacerlo usted mismo. Esto se denomina poder notarial duradero para asuntos financieros. Este es un documento legal diferente del poder notarial duradero para asuntos de salud. Usted puede elegir a la Albertson's persona o a Pharmacist, hospital de la designada como representante de sus asuntos de  salud para que acte como representante en los asuntos de dinero. Si no asigna un apoderado, o si se considera que el apoderado no est actuando para bien del representado, un tribunal podr Designer, industrial/product legal para que acte en representacin suya. El testamento vital El testamento vital es un conjunto de indicaciones en las que se establecen sus deseos acerca de la atencin mdica para el momento en que no pueda expresarlas usted mismo. Los mdicos deben conservar una copia del testamento vital en su historia clnica. Le recomendamos que entregue una copia a familiares o amigos. A fin de alertar a sus  cuidadores en caso de una emergencia, puede colocar una tarjeta en la billetera que indique que tiene un testamento vital y dnde Film/video editor. El testamento vital se [place] si:  Tiene una enfermedad terminal.  Queda discapacitado.  No puede comunicarse ni tomar decisiones. Los puntos a Engineer, maintenance testamento vital incluyen los siguientes:  El uso o no uso de equipos de soporte vital, como dispositivos de dilisis y ventiladores (respiradores).  [place]. Esto les indica a los mdicos que no usen Actor (RCP) si se le detiene la respiracin o los latidos cardacos.  El [place] o no uso de alimentacin por sonda.  La administracin o no administracin de alimentos y lquidos.  La atencin de [place] (cuidados paliativos) cuando el objetivo es la comodidad ms que la cura.  La donacin de rganos y tejidos. El testamento vital no da indicaciones acerca de la distribucin del dinero ni las propiedades en caso de fallecimiento. ONR u ONIR La orden de no reanimar (ONR) es el pedido de no Scientist, research (medical) (RCP) en el caso de que el corazn o la respiracin se Production designer, theatre/television/film. Si no se realiz ni se present [employer] u ONIR, el mdico intentar ayudar a cualquier paciente cuyo corazn o respiracin se detengan. Si planea someterse a [place], hable con su mdico sobre el cumplimiento de su ONR u ONIR en caso de que surjan problemas. Qu sucede si no tengo una voluntad anticipada? Si no tiene un documento de voluntades anticipadas, algunos estados asignan familiares a cargo de la toma de decisiones para que acten en representacin suya segn cun cercana sea su relacin. Cada estado tiene sus propias leyes sobre las voluntades anticipadas. Recomendamos consultar a su mdico, su abogado o al representante estatal acerca de las leyes de su [place]. Resumen  Las voluntades anticipadas son documentos legales que le permiten tomar decisiones por adelantado  acerca de su tratamiento mdico y atencin de la salud en caso de no poder informar a los dems usted mismo.  El proceso de Paediatric nurse y Building surveyor las voluntades anticipadas debe realizarse con el transcurso del [place]. Puede modificar las voluntades anticipadas, incluso despus de haberlas firmado.  Las voluntades anticipadas incluyen las rdenes ONR u [place], los testamentos vitales y Landscape architect de un representante como su apoderado. Esta informacin no tiene Marine scientist el consejo del mdico. Asegrese de hacerle al mdico cualquier pregunta que tenga. Document Revised: 09/23/2018 Document Reviewed: 09/23/2018 Elsevier Patient Education  [place].

## 2020-01-18 LAB — CBC WITH DIFFERENTIAL/PLATELET
Basophils Absolute: 0.1 10*3/uL (ref 0.0–0.2)
Basos: 1 %
EOS (ABSOLUTE): 0.3 10*3/uL (ref 0.0–0.4)
Eos: 3 %
Hematocrit: 41.9 % (ref 34.0–46.6)
Hemoglobin: 13.7 g/dL (ref 11.1–15.9)
Immature Grans (Abs): 0 10*3/uL (ref 0.0–0.1)
Immature Granulocytes: 0 %
Lymphocytes Absolute: 4.5 10*3/uL — ABNORMAL HIGH (ref 0.7–3.1)
Lymphs: 44 %
MCH: 28.7 pg (ref 26.6–33.0)
MCHC: 32.7 g/dL (ref 31.5–35.7)
MCV: 88 fL (ref 79–97)
Monocytes Absolute: 0.6 10*3/uL (ref 0.1–0.9)
Monocytes: 5 %
Neutrophils Absolute: 4.9 10*3/uL (ref 1.4–7.0)
Neutrophils: 47 %
Platelets: 416 10*3/uL (ref 150–450)
RBC: 4.77 x10E6/uL (ref 3.77–5.28)
RDW: 14.2 % (ref 11.7–15.4)
WBC: 10.3 10*3/uL (ref 3.4–10.8)

## 2020-01-18 LAB — COMPREHENSIVE METABOLIC PANEL
ALT: 24 IU/L (ref 0–32)
AST: 23 IU/L (ref 0–40)
Albumin/Globulin Ratio: 1.9 (ref 1.2–2.2)
Albumin: 4.1 g/dL (ref 3.8–4.8)
Alkaline Phosphatase: 97 IU/L (ref 44–121)
BUN/Creatinine Ratio: 25 (ref 12–28)
BUN: 14 mg/dL (ref 8–27)
Bilirubin Total: 0.3 mg/dL (ref 0.0–1.2)
CO2: 22 mmol/L (ref 20–29)
Calcium: 9.7 mg/dL (ref 8.7–10.3)
Chloride: 103 mmol/L (ref 96–106)
Creatinine, Ser: 0.57 mg/dL (ref 0.57–1.00)
GFR calc Af Amer: 109 mL/min/{1.73_m2} (ref >59)
GFR calc non Af Amer: 95 mL/min/{1.73_m2} (ref >59)
Globulin, Total: 2.2 g/dL (ref 1.5–4.5)
Glucose: 96 mg/dL (ref 65–99)
Potassium: 3.9 mmol/L (ref 3.5–5.2)
Sodium: 140 mmol/L (ref 134–144)
Total Protein: 6.3 g/dL (ref 6.0–8.5)

## 2020-01-18 LAB — LIPID PANEL
Chol/HDL Ratio: 2.1 ratio (ref 0.0–4.4)
Cholesterol, Total: 115 mg/dL (ref 100–199)
HDL: 55 mg/dL (ref >39)
LDL Chol Calc (NIH): 42 mg/dL (ref 0–99)
Triglycerides: 95 mg/dL (ref 0–149)
VLDL Cholesterol Cal: 18 mg/dL (ref 5–40)

## 2020-01-18 LAB — CARDIOVASCULAR RISK ASSESSMENT

## 2020-01-18 LAB — HEMOGLOBIN A1C
Est. average glucose Bld gHb Est-mCnc: 148 mg/dL
Hgb A1c MFr Bld: 6.8 % — ABNORMAL HIGH (ref 4.8–5.6)

## 2020-01-19 ENCOUNTER — Other Ambulatory Visit: Payer: Self-pay

## 2020-01-19 ENCOUNTER — Ambulatory Visit (INDEPENDENT_AMBULATORY_CARE_PROVIDER_SITE_OTHER): Payer: Medicare HMO | Admitting: Family Medicine

## 2020-01-19 ENCOUNTER — Encounter: Payer: Self-pay | Admitting: Family Medicine

## 2020-01-19 VITALS — BP 118/70 | HR 81 | Resp 18 | Ht <= 58 in | Wt 195.4 lb

## 2020-01-19 DIAGNOSIS — I776 Arteritis, unspecified: Secondary | ICD-10-CM | POA: Diagnosis not present

## 2020-01-19 DIAGNOSIS — E1169 Type 2 diabetes mellitus with other specified complication: Secondary | ICD-10-CM | POA: Diagnosis not present

## 2020-01-19 DIAGNOSIS — L929 Granulomatous disorder of the skin and subcutaneous tissue, unspecified: Secondary | ICD-10-CM

## 2020-01-19 DIAGNOSIS — Z0000 Encounter for general adult medical examination without abnormal findings: Secondary | ICD-10-CM | POA: Diagnosis not present

## 2020-01-19 DIAGNOSIS — E785 Hyperlipidemia, unspecified: Secondary | ICD-10-CM

## 2020-01-19 DIAGNOSIS — Z13820 Encounter for screening for osteoporosis: Secondary | ICD-10-CM

## 2020-01-19 DIAGNOSIS — I7782 Antineutrophilic cytoplasmic antibody (ANCA) vasculitis: Secondary | ICD-10-CM

## 2020-01-19 DIAGNOSIS — Z1239 Encounter for other screening for malignant neoplasm of breast: Secondary | ICD-10-CM | POA: Diagnosis not present

## 2020-01-19 LAB — POCT UA - MICROALBUMIN: Microalbumin Ur, POC: 80 mg/L

## 2020-01-19 NOTE — Patient Instructions (Addendum)
  Ms. Julia Johns , Thank you for taking time to come for your Medicare Wellness Visit. I appreciate your ongoing commitment to your health goals. Please review the following plan we discussed and let me know if I can assist you in the future.   These are the goals we discussed: complete knee xray as discussed and recommended by Dr. Tobie Poet Complete DEXA(bone density) and mammogram Go have your eyes examined by the eye doctor in [facility] Consider completing the living will Complete your knee xray This is a list of the screening recommended for you and due dates:  Health Maintenance  Topic Date Due  . Eye exam for diabetics  Never done  . Urine Protein Check  Never done  . Tetanus Vaccine  Never done  . Colon Cancer Screening  Never done  . Mammogram  Never done  . DEXA scan (bone density measurement)  Never done  . Pneumonia vaccines (1 of 2 - PCV13) Never done  . Hemoglobin A1C  07/17/2020  . Complete foot exam   01/16/2021  . Flu Shot  Completed  . COVID-19 Vaccine  Completed  .  Hepatitis C: One time screening is recommended by Center for Disease Control  (CDC) for  adults born from [birth year] through 1965.   Completed

## 2020-01-19 NOTE — Progress Notes (Signed)
Subjective:    Julia Johns is a 69 y.o. female who presents for Medicare Annual/Subsequent preventive examination.  Preventive Screening-Counseling & Management  Tobacco Social History   Tobacco Use  Smoking Status Never Smoker  Smokeless Tobacco Never Used     Problems Prior to Visit 1.  Knee pain-right-Dr. Cox request an xray and is concerned about a Bakers Cyst 2. Recovering from pneumonia-bilat infiltrates 3. Granulomatosis  With polyangiitis with renal involvement-Dr. Thapa-Rheumatology 4. 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 Ulice Dash Patel-[facility] 5. Lung mass noted on CT with hemoptysis 05/2018-bronchology-6/4-atypical squamous cells -reactive 6. AR-pt using otc allergy meds 7. DM-needs microalb and eye appt   Current Problems (verified) Patient Active Problem List   Diagnosis Date Noted  . Chronic cough 12/22/2019  . Dyslipidemia associated with type 2 diabetes mellitus ([place]) 05/26/2019  . Impingement syndrome of right shoulder 05/26/2019  . Mixed hyperlipidemia 05/26/2019  . Right rotator cuff tear 04/15/2019  . Costochondral chest pain 04/15/2019  . High risk medication use 10/27/2018  . Primary osteoarthritis involving multiple joints 10/27/2018  . Abnormal findings on diagnostic imaging of lung 09/25/2018  . Rash of neck 09/25/2018  . 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 Dysphagia 09/25/2018  . Heart burn 08/24/2018  . Acute lower UTI 07/21/2018  . CKD (chronic kidney disease) 07/21/2018  . Hematuria 07/03/2018  . Yeast infection involving the vagina and surrounding area 07/03/2018  . Dysuria 07/03/2018  . Chronic respiratory failure with hypoxia ([place])   . Bilateral lower extremity edema   . Obesity (BMI 30.0-34.9)   . Colitis 06/15/2018  . 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 Grade II hemorrhoids 07/03/2017  . Rectal bleeding  07/03/2017    Medications Prior to Visit Current Outpatient Medications on File Prior to Visit  Medication Sig Dispense Refill  . ACCU-CHEK AVIVA PLUS test strip     . Accu-Chek Softclix Lancets lancets USE TO CHECK BLOOD SUGAR TWICE A DAY E11.69 100 each 2  . chlorthalidone (HYGROTON) 25 MG tablet Take 12.5 mg by mouth daily.    . fluticasone (FLONASE) 50 MCG/ACT nasal spray Place 1 spray into both nostrils daily. 16 g 2  . glipiZIDE (GLUCOTROL) 5 MG tablet Take 0.5 tablets (2.5 mg total) by mouth 2 (two) times daily before a meal. 1/2 tablet in am and 1 tablet in pm 180 tablet 0  . omeprazole (PRILOSEC) 20 MG capsule Take 1 capsule (20 mg total) by mouth daily. 30 capsule 1  . rosuvastatin (CRESTOR) 10 MG tablet Take 1 tablet by mouth once daily 90 tablet 2  . sulfamethoxazole-trimethoprim (BACTRIM DS) 800-160 MG tablet TAKE 1 TABLET BY MOUTH ON MONDAY, WEDNESDAY AND FRIDAY 40 tablet 0  . triamcinolone cream (KENALOG) 0.1 % Apply 1 application topically 2 (two) times daily. 45 g 3   Current Facility-Administered Medications on File Prior to Visit  Medication Dose Route Frequency Provider Last Rate Last Admin  . triamcinolone acetonide (KENALOG-40) injection 80 mg  80 mg Intramuscular Once Cox, Elnita Maxwell, MD        Current Medications (verified) Current Outpatient Medications  Medication Sig Dispense Refill  . ACCU-CHEK AVIVA PLUS test strip     . Accu-Chek Softclix Lancets lancets USE TO CHECK BLOOD SUGAR TWICE A DAY E11.69 100 each 2  . chlorthalidone (HYGROTON) 25 MG tablet Take 12.5 mg by mouth daily.    . fluticasone (FLONASE) 50 MCG/ACT nasal spray Place 1 spray into both nostrils daily.  16 g 2  . glipiZIDE (GLUCOTROL) 5 MG tablet Take 0.5 tablets (2.5 mg total) by mouth 2 (two) times daily before a meal. 1/2 tablet in am and 1 tablet in pm 180 tablet 0  . omeprazole (PRILOSEC) 20 MG capsule Take 1 capsule (20 mg total) by mouth daily. 30 capsule 1  . rosuvastatin (CRESTOR) 10 MG  tablet Take 1 tablet by mouth once daily 90 tablet 2  . sulfamethoxazole-trimethoprim (BACTRIM DS) 800-160 MG tablet TAKE 1 TABLET BY MOUTH ON MONDAY, WEDNESDAY AND FRIDAY 40 tablet 0  . triamcinolone cream (KENALOG) 0.1 % Apply 1 application topically 2 (two) times daily. 45 g 3   Current Facility-Administered Medications  Medication Dose Route Frequency Provider Last Rate Last Admin  . triamcinolone acetonide (KENALOG-40) injection 80 mg  80 mg Intramuscular Once Cox, Kirsten, MD         Allergies (verified) Penicillins and Tetracycline   PAST HISTORY  Family History Family History  Problem Relation Age of Onset  . Uterine cancer Mother     Social History Social History   Tobacco Use  . Smoking status: Never Smoker  . Smokeless tobacco: Never Used  Substance Use Topics  . Alcohol use: Never     Are there smokers in your home (other than you)? No tobacco use  Risk Factors Current exercise habits: knee causes limited mobility Dietary issues discussed:pt cooks at home  Cardiac risk factors:  Hyperlipidemia-LDL 42, A1c 6.8%, renal GFR 95, Cr .57 Depression Screen-PHQ9-1   Activities of Daily Living In your present state of health, do you have any difficulty performing the following activities?:  Driving? Son drives pt-moved from [place]?  Pt unable to write checks Feeding yourself? No difficulty  Getting from bed to chair?  No difficulty Climbing a flight of stairs 3 steps Preparing food and eating?: no difficulty Bathing or showering? Using bath-has a handle Getting dressed: no difficulty Getting to the toilet? No difficulty Using the toilet: No difficuty Moving around from place to place: no  Difficulty  In the past year have you fallen or had a near fall?: no falls   No partner-husband died 20+ years ago  Hearing Difficulties: no problems   Do you feel that you have a problem with memory? No problems  Do you feel safe at home?  yes  Cognitive  Testing    Advanced Directives have been discussed with the patient?   List the Names of Other Physician/Practitioners you currently use: 1.  Dr. [name] -Pulmonary 2. Dr. Thapa-Rheumatology 3. Dr. Patel-Nephrology 4. Dr. Cox-Family Physician Immunization History  Administered Date(s) Administered  . Fluad Quad(high Dose 65+) 10/22/2019  . Influenza-Unspecified 10/23/2017  . PFIZER SARS-COV-2 Vaccination 04/15/2019, 05/10/2019, 11/18/2019    Screening Tests Health Maintenance  Topic Date Due  . OPHTHALMOLOGY EXAM  Never done  . URINE MICROALBUMIN  Never done  . TETANUS/TDAP  Never done  . COLONOSCOPY (Pts 45-40yrs Insurance coverage will need to be confirmed)  Never done  . MAMMOGRAM  Never done  . DEXA SCAN  Never done  . PNA vac Low Risk Adult (1 of 2 - PCV13) Never done  . HEMOGLOBIN A1C  07/17/2020  . FOOT EXAM  01/16/2021  . INFLUENZA VACCINE  Completed  . COVID-19 Vaccine  Completed  . Hepatitis C Screening  Completed   Ryan Ogborn Hannah Beat, MD   01/19/2020    Objective:     Body mass index is 42.28 kg/m. BP 118/70   Pulse  81   Resp 18   Ht 4\' 9"  (1.448 m)   Wt 195 lb 6.4 oz (88.6 kg)   SpO2 95%   BMI 42.28 kg/m     Assessment:   1. Screening breast examination - MM Digital Screening; Future  2. Encounter for screening for osteoporosis - DG Bone Density; Future  3. Granulomatosis - Ambulatory referral to Ophthalmology Rheumatology recommended referral but pt was concerned about out of pocket cost, pt with DM needs evaluation for diabetic eye evaluation. Explained to pt need for evaluation and new law that cost would be disclosed prior to visit 4. Medicare annual wellness visit, subsequent DEXA/mammogram/living will -printed in [place] for pt to review Plan:    During the course of the visit the patient was educated and counseled about appropriate screening and preventive services including:   Patient Instructions (the written plan) was given to the  patient.  Medicare Attestation I have personally reviewed: The patient's medical and social history Their use of alcohol, tobacco or illicit drugs Their current medications and supplements The patient's functional ability including ADLs,fall risks, home safety risks, cognitive, and hearing and visual impairment Diet and physical activities Evidence for depression or mood disorders  The patient's weight, height, BMI, and visual acuity have been recorded in the chart.  I have made referrals, counseling, and provided education to the patient based on review of the above and I have provided the patient with a written personalized care plan for preventive services.     Mertha Baars, MD   01/19/2020

## 2020-02-07 ENCOUNTER — Other Ambulatory Visit: Payer: Self-pay | Admitting: Family Medicine

## 2020-02-10 ENCOUNTER — Other Ambulatory Visit: Payer: Self-pay | Admitting: Family Medicine

## 2020-02-10 DIAGNOSIS — I7782 Antineutrophilic cytoplasmic antibody (ANCA) vasculitis: Secondary | ICD-10-CM

## 2020-02-10 DIAGNOSIS — M318 Other specified necrotizing vasculopathies: Secondary | ICD-10-CM

## 2020-02-24 DIAGNOSIS — N179 Acute kidney failure, unspecified: Secondary | ICD-10-CM | POA: Diagnosis not present

## 2020-03-03 ENCOUNTER — Telehealth: Payer: Self-pay | Admitting: Pulmonary Disease

## 2020-03-03 DIAGNOSIS — N189 Chronic kidney disease, unspecified: Secondary | ICD-10-CM | POA: Diagnosis not present

## 2020-03-03 DIAGNOSIS — Z20822 Contact with and (suspected) exposure to covid-19: Secondary | ICD-10-CM | POA: Diagnosis not present

## 2020-03-03 DIAGNOSIS — D631 Anemia in chronic kidney disease: Secondary | ICD-10-CM | POA: Diagnosis not present

## 2020-03-03 DIAGNOSIS — I776 Arteritis, unspecified: Secondary | ICD-10-CM | POA: Diagnosis not present

## 2020-03-03 DIAGNOSIS — U071 COVID-19: Secondary | ICD-10-CM | POA: Diagnosis not present

## 2020-03-03 DIAGNOSIS — I10 Essential (primary) hypertension: Secondary | ICD-10-CM | POA: Diagnosis not present

## 2020-03-03 NOTE — Telephone Encounter (Signed)
Still has cough , that is ongoing from Dec 2021 . Not any better. Meds not working   No fever or discolored mucus  Will get covid test to make sure but no sick sx . Fully vaccinated.  Ov made for follow up next week with Volanda Napoleon NP for follow up   Cont on current regimen for cough   Please contact office for sooner follow up if symptoms do not improve or worsen or seek emergency care

## 2020-03-07 ENCOUNTER — Ambulatory Visit: Payer: Medicare HMO | Admitting: Primary Care

## 2020-03-10 ENCOUNTER — Encounter (HOSPITAL_COMMUNITY): Payer: Medicare HMO

## 2020-03-27 ENCOUNTER — Other Ambulatory Visit (HOSPITAL_COMMUNITY): Payer: Self-pay | Admitting: *Deleted

## 2020-03-28 ENCOUNTER — Other Ambulatory Visit: Payer: Self-pay

## 2020-03-28 ENCOUNTER — Ambulatory Visit (HOSPITAL_COMMUNITY)
Admission: RE | Admit: 2020-03-28 | Discharge: 2020-03-28 | Disposition: A | Discharge status: Home / Self Care | Payer: Medicare HMO | Source: Ambulatory Visit | Attending: Nephrology | Admitting: Nephrology

## 2020-03-28 DIAGNOSIS — I776 Arteritis, unspecified: Secondary | ICD-10-CM | POA: Diagnosis not present

## 2020-03-28 MED ORDER — METHYLPREDNISOLONE SODIUM SUCC 125 MG IJ SOLR
INTRAMUSCULAR | Status: AC
  Administered 2020-03-28: 125 mg via INTRAVENOUS
  Filled 2020-03-28: qty 2

## 2020-03-28 MED ORDER — ACETAMINOPHEN 325 MG PO TABS: 650.0000 mg | ORAL_TABLET | Freq: Once | ORAL | Status: AC

## 2020-03-28 MED ORDER — DIPHENHYDRAMINE HCL 50 MG/ML IJ SOLN: 25.0000 mg | Freq: Once | INTRAMUSCULAR | Status: AC

## 2020-03-28 MED ORDER — ACETAMINOPHEN 325 MG PO TABS
ORAL_TABLET | ORAL | Status: AC
  Administered 2020-03-28: 650 mg via ORAL
  Filled 2020-03-28: qty 2

## 2020-03-28 MED ORDER — DIPHENHYDRAMINE HCL 50 MG/ML IJ SOLN
INTRAMUSCULAR | Status: AC
  Administered 2020-03-28: 25 mg via INTRAVENOUS
  Filled 2020-03-28: qty 1

## 2020-03-28 MED ORDER — METHYLPREDNISOLONE SODIUM SUCC 125 MG IJ SOLR: 125.0000 mg | Freq: Once | INTRAMUSCULAR | Status: AC

## 2020-03-28 MED ORDER — SODIUM CHLORIDE 0.9 % IV SOLN
500.0000 mg | Freq: Once | INTRAVENOUS | Status: AC
  Administered 2020-03-28: 500 mg via INTRAVENOUS
  Filled 2020-03-28: qty 50

## 2020-03-29 ENCOUNTER — Ambulatory Visit (INDEPENDENT_AMBULATORY_CARE_PROVIDER_SITE_OTHER): Payer: Medicare HMO

## 2020-03-29 ENCOUNTER — Ambulatory Visit (INDEPENDENT_AMBULATORY_CARE_PROVIDER_SITE_OTHER): Payer: Medicare HMO | Admitting: Pulmonary Disease

## 2020-03-29 ENCOUNTER — Encounter: Payer: Self-pay | Admitting: Pulmonary Disease

## 2020-03-29 ENCOUNTER — Other Ambulatory Visit: Payer: Self-pay

## 2020-03-29 DIAGNOSIS — R059 Cough, unspecified: Secondary | ICD-10-CM | POA: Diagnosis not present

## 2020-03-29 DIAGNOSIS — R053 Chronic cough: Secondary | ICD-10-CM

## 2020-03-29 DIAGNOSIS — L989 Disorder of the skin and subcutaneous tissue, unspecified: Secondary | ICD-10-CM

## 2020-03-29 MED ORDER — HYDROCORTISONE 1 % EX OINT: 1.0000 "application " | TOPICAL_OINTMENT | Freq: Two times a day (BID) | CUTANEOUS | 0 refills | Status: DC

## 2020-03-29 NOTE — Assessment & Plan Note (Signed)
No evidence of recurrence of vasculitis.  We will repeat chest x-ray today.  Cough could be related to recent Covid infection although it does seem like she likely had Covid infection back in December

## 2020-03-29 NOTE — Assessment & Plan Note (Signed)
Unclear cause.  We will ask her to stop Bactrim for 2 weeks and see if these lesions go away. We will also give her cortisone cream for local application. We will need referral to dermatology for biopsy if they persist

## 2020-03-29 NOTE — Progress Notes (Signed)
   Subjective:    Patient ID: Julia Johns, female    DOB: 1950-09-02, 70 y.o.   MRN: [id number]  HPI  20 never smoker with ANCA vasculitis/pulmonary renal syndrome s/p   She presented with hemoptysis andabnormal CT showing lung mass in May/2020.  Underwent renal biopsy. Treated with [name]  Last office visit 01/2019 for acute cough , CXR neg Covid infection jan 2022 Reviewed nephrology office visit 02/2020, received [name] infusion yesterday. Cough persisted for 3 to 4 months but is now is mildly improved, omeprazole did not help.  She reports occasional reflux symptoms  Her grandson and son had COVID in December she initially tested negative but then tested positive in February  Complains of skin lesions over face and upper chest, these have persisted since last visit in 02/2019   Review of Systems neg for any significant sore throat, dysphagia, itching, sneezing, nasal congestion or excess/ purulent secretions, fever, chills, sweats, unintended wt loss, pleuritic or exertional cp, hempoptysis, orthopnea pnd or change in chronic leg swelling. Also denies presyncope, palpitations, heartburn, abdominal pain, nausea, vomiting, diarrhea or change in bowel or urinary habits, dysuria,hematuria, rash, arthralgias, visual complaints, headache, numbness weakness or ataxia.     Objective:   Physical Exam  Gen. Pleasant, obese, in no distress ENT - no lesions, no post nasal drip Neck: No JVD, no thyromegaly, no carotid bruits Lungs: no use of accessory muscles, no dullness to percussion, decreased without rales or rhonchi  Cardiovascular: Rhythm regular, heart sounds  normal, no murmurs or gallops, no peripheral edema Musculoskeletal: No deformities, no cyanosis or clubbing , no tremors       Assessment & Plan:

## 2020-03-29 NOTE — Patient Instructions (Signed)
   CXR today STOP taking bactrim x 2 weeks to see if skin spots go away If not, need to see dermatology

## 2020-03-31 ENCOUNTER — Encounter: Payer: Self-pay | Admitting: *Deleted

## 2020-03-31 ENCOUNTER — Telehealth: Payer: Self-pay

## 2020-03-31 NOTE — Telephone Encounter (Signed)
-----   Message from Rigoberto Noel, MD sent at 03/30/2020  4:57 PM EST ----- Clear lungs.  No evidence of Covid damage

## 2020-03-31 NOTE — Progress Notes (Signed)
Attempted, with spanish interpreter, to call patient at both numbers listed on chart. No answer at either number and was unable to leave message on voicemail due to not being set up. Will send to normal result pool to send letter.

## 2020-03-31 NOTE — Telephone Encounter (Signed)
Attempted to call both phone numbers listed to go over xray results and was unable to reach patient or leave message due to no voicemail. Spanish interpreter on phone line to assist.

## 2020-04-17 NOTE — Progress Notes (Signed)
Subjective:  Patient ID: Julia Johns, female    DOB: 07-May-1950  Age: 70 y.o. MRN: [id number]  Chief Complaint  Patient presents with  . Diabetes  . Hyperlipidemia    HPI Dyslipidemia associated with type 2 diabetes mellitus ([place]) Does not eat healthy because likes her carbs.  She does eat supper. Eats breakfast and lunch.  Sugars 140-160. Checks once weekly.  Glipizide 5 mg 1/2 in am and 1 in pm crestor 10 mg once daily   No swelling in legs. Takes chlorthalidone 25 mg 1/2 daily.  Complaining of BL knees. I gave her knee injections last year which helped.  Occasionally aleve 1-2 times on occasionally. Helps.  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  WELL TREATED WITH RITUXIN.  cOVID 19: 1 1/2 months ago and was diagnosed with covid 19 to have her checked and was positive. Had a little cough, but resolved.   Current Outpatient Medications on File Prior to Visit  Medication Sig Dispense Refill  . ACCU-CHEK AVIVA PLUS test strip     . Accu-Chek Softclix Lancets lancets USE TO CHECK BLOOD SUGAR TWICE A DAY E11.69 100 each 2  . fluticasone (FLONASE) 50 MCG/ACT nasal spray Place 1 spray into both nostrils daily. (Patient not taking: Reported on 03/29/2020) 16 g 2  . hydrocortisone 1 % ointment Apply 1 application topically 2 (two) times daily. 30 g 0  . omeprazole (PRILOSEC) 20 MG capsule Take 1 capsule (20 mg total) by mouth daily. (Patient not taking: Reported on 03/29/2020) 30 capsule 1   No current facility-administered medications on file prior to visit.   Past Medical History:  Diagnosis Date  . Chronic kidney disease   . Diabetes mellitus without complication ([place])   . Vasculitis ([place])   . Vasculitis, ANCA positive ([place])    Past Surgical History:  Procedure Laterality Date  . VIDEO BRONCHOSCOPY Bilateral 06/18/2018   Procedure: VIDEO BRONCHOSCOPY WITH FLUORO;  Surgeon: Rigoberto Noel, MD;  Location: [facility];  Service:  Cardiopulmonary;  Laterality: Bilateral;  . VIDEO BRONCHOSCOPY WITH ENDOBRONCHIAL ULTRASOUND N/A 06/25/2018   Procedure: VIDEO BRONCHOSCOPY WITH ENDOBRONCHIAL ULTRASOUND;  Surgeon: Rigoberto Noel, MD;  Location: [facility];  Service: Thoracic;  Laterality: N/A;    Family History  Problem Relation Age of Onset  . Uterine cancer Mother    Social History   Socioeconomic History  . Marital status: Single    Spouse name: Not on file  . Number of children: Not on file  . Years of education: Not on file  . Highest education level: Not on file  Occupational History  . Not on file  Tobacco Use  . Smoking status: Never Smoker  . Smokeless tobacco: Never Used  Substance and Sexual Activity  . Alcohol use: Never  . Drug use: Never  . Sexual activity: Not Currently  Other Topics Concern  . Not on file  Social History Narrative   Patient primary language is spanish, she lives at home with her son [name] and daughter in law [name]    Social Determinants of Health   Financial Resource Strain: Not on file  Food Insecurity: Not on file  Transportation Needs: Not on file  Physical Activity: Not on file  Stress: Not on file  Social Connections: Not on file    Review of Systems  Constitutional: Negative for chills, fatigue and fever.  HENT: Negative for congestion, ear pain, rhinorrhea and sore throat.   Respiratory: Negative for cough  and shortness of breath.   Cardiovascular: Negative for chest pain.  Gastrointestinal: Negative for abdominal pain, constipation, diarrhea, nausea and vomiting.  Genitourinary: Negative for dysuria and urgency.  Musculoskeletal: Positive for arthralgias (BL knees). Negative for back pain and myalgias.  Neurological: Negative for dizziness, weakness, light-headedness and headaches.  Psychiatric/Behavioral: Negative for dysphoric mood. The patient is not nervous/anxious.      Objective:  BP 128/76   Pulse 76   Temp (!) 97.4 F (36.3 C)   Ht  4\' 9"  (1.448 m)   Wt 192 lb (87.1 kg)   SpO2 98%   BMI 41.55 kg/m   BP/Weight 04/18/2020 01/26/1094 0/04/5407  Systolic BP 811 914 782  Diastolic BP 76 70 64  Wt. (Lbs) 192 193 192  BMI 41.55 41.76 41.55    Physical Exam Vitals reviewed.  Constitutional:      Appearance: Normal appearance. She is normal weight.  Neck:     Vascular: No carotid bruit.  Cardiovascular:     Rate and Rhythm: Normal rate and regular rhythm.     Pulses: Normal pulses.     Heart sounds: Normal heart sounds.  Pulmonary:     Effort: Pulmonary effort is normal. No respiratory distress.     Breath sounds: Normal breath sounds.  Abdominal:     General: Abdomen is flat. Bowel sounds are normal.     Palpations: Abdomen is soft.     Tenderness: There is no abdominal tenderness.  Musculoskeletal:        General: No swelling or tenderness.  Neurological:     Mental Status: She is alert and oriented to person, place, and time.  Psychiatric:        Mood and Affect: Mood normal.        Behavior: Behavior normal.     Diabetic Foot Exam - Simple   Simple Foot Form Diabetic Foot exam was performed with the following findings: Yes 04/18/2020  8:54 AM  Visual Inspection See comments: Yes Sensation Testing Intact to touch and monofilament testing bilaterally: Yes Pulse Check Posterior Tibialis and Dorsalis pulse intact bilaterally: Yes Comments CALLUSED DRY FEET. NO ULCERS.       Lab Results  Component Value Date   WBC 10.3 01/17/2020   HGB 13.7 01/17/2020   HCT 41.9 01/17/2020   PLT 416 01/17/2020   GLUCOSE 96 01/17/2020   CHOL 115 01/17/2020   TRIG 95 01/17/2020   HDL 55 01/17/2020   LDLCALC 42 01/17/2020   ALT 24 01/17/2020   AST 23 01/17/2020   NA 140 01/17/2020   K 3.9 01/17/2020   CL 103 01/17/2020   CREATININE 0.57 01/17/2020   BUN 14 01/17/2020   CO2 22 01/17/2020   INR 1.2 07/05/2018   HGBA1C 6.8 (H) 01/17/2020   MICROALBUR 80 01/19/2020      Assessment & Plan:   1.  Dyslipidemia associated with type 2 diabetes mellitus (H[facility]) Control: good Recommend check feet daily. Recommend annual eye exams. Medicines: no changes. Continue to work on eating a healthy diet and exercise.  Labs drawn today.   - Lipid panel - Hemoglobin A1c - Comprehensive metabolic panel - CBC with Differential/Platelet - POCT UA - Microalbumin  2. Mixed hyperlipidemia Well controlled.  No changes to medicines.  Continue to work on eating a healthy diet and exercise.  Labs drawn today.   3. Need for pneumococcal vaccination - Pneumococcal conjugate vaccine 13-valent  4. Primary osteoarthritis of both knees Recommend weight loss.  May use  otc voltaren gel.   5. Morbid obesity with BMI of 40.0-44.9, adult ([place]) Recommend continue to work on eating healthy diet and exercise.  6. 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  Took last dose of rituxin about one month ago.   7. History of covid 19:  6 weeks ago. No remaining sxs.   Meds ordered this encounter  Medications  . chlorthalidone (HYGROTON) 25 MG tablet    Sig: Take 0.5 tablets (12.5 mg total) by mouth daily.    Dispense:  90 tablet    Refill:  1  . glipiZIDE (GLUCOTROL) 5 MG tablet    Sig: TAKE 1/2 (ONE-HALF) TABLET BY MOUTH BEFORE BREAKFAST AND 1 TABLET IN THE EVENING BEFORE  SUPPER    Dispense:  180 tablet    Refill:  1  . rosuvastatin (CRESTOR) 10 MG tablet    Sig: Take 1 tablet (10 mg total) by mouth daily.    Dispense:  90 tablet    Refill:  1    Orders Placed This Encounter  Procedures  . Pneumococcal conjugate vaccine 13-valent  . Lipid panel  . Hemoglobin A1c  . Comprehensive metabolic panel  . CBC with Differential/Platelet  . POCT UA - Microalbumin      Follow-up: Return in about 4 months (around 08/18/2020) for fasting.  An After Visit Summary was printed and given to the patient.  [name], MD [facility] [phone]

## 2020-04-18 ENCOUNTER — Other Ambulatory Visit: Payer: Self-pay

## 2020-04-18 ENCOUNTER — Encounter: Payer: Self-pay | Admitting: Family Medicine

## 2020-04-18 ENCOUNTER — Ambulatory Visit (INDEPENDENT_AMBULATORY_CARE_PROVIDER_SITE_OTHER): Payer: Medicare HMO

## 2020-04-18 ENCOUNTER — Ambulatory Visit (INDEPENDENT_AMBULATORY_CARE_PROVIDER_SITE_OTHER): Payer: Medicare HMO | Admitting: Family Medicine

## 2020-04-18 VITALS — BP 128/76 | HR 76 | Temp 97.4°F | Ht <= 58 in | Wt 192.0 lb

## 2020-04-18 DIAGNOSIS — Z8616 Personal history of COVID-19: Secondary | ICD-10-CM | POA: Diagnosis not present

## 2020-04-18 DIAGNOSIS — Z6841 Body Mass Index (BMI) 40.0 and over, adult: Secondary | ICD-10-CM | POA: Diagnosis not present

## 2020-04-18 DIAGNOSIS — E782 Mixed hyperlipidemia: Secondary | ICD-10-CM | POA: Diagnosis not present

## 2020-04-18 DIAGNOSIS — E1169 Type 2 diabetes mellitus with other specified complication: Secondary | ICD-10-CM

## 2020-04-18 DIAGNOSIS — M170 Bilateral primary osteoarthritis of knee: Secondary | ICD-10-CM | POA: Diagnosis not present

## 2020-04-18 DIAGNOSIS — I7782 Antineutrophilic cytoplasmic antibody (ANCA) vasculitis: Secondary | ICD-10-CM

## 2020-04-18 DIAGNOSIS — Z23 Encounter for immunization: Secondary | ICD-10-CM | POA: Diagnosis not present

## 2020-04-18 DIAGNOSIS — E785 Hyperlipidemia, unspecified: Secondary | ICD-10-CM | POA: Diagnosis not present

## 2020-04-18 DIAGNOSIS — I776 Arteritis, unspecified: Secondary | ICD-10-CM

## 2020-04-18 LAB — POCT UA - MICROALBUMIN: Microalbumin Ur, POC: 30 mg/L

## 2020-04-18 MED ORDER — GLIPIZIDE 5 MG PO TABS: ORAL_TABLET | ORAL | 1 refills | Status: DC

## 2020-04-18 MED ORDER — ROSUVASTATIN CALCIUM 10 MG PO TABS: 10.0000 mg | ORAL_TABLET | Freq: Every day | ORAL | 1 refills | Status: DC

## 2020-04-18 MED ORDER — CHLORTHALIDONE 25 MG PO TABS: 12.5000 mg | ORAL_TABLET | Freq: Every day | ORAL | 1 refills | Status: DC

## 2020-04-18 NOTE — Patient Instructions (Addendum)
Voltaren gel (diclofenac) otc may try for knees.  Recommend weight loss.  If not improving, may call back to set up knee injections.

## 2020-04-18 NOTE — Progress Notes (Signed)
   Covid-19 Vaccination Clinic  Name:  Clemie General    MRN: [id number] DOB: 11-03-1950  04/18/2020  Ms. Henriquez Frederico [place]man was observed post Covid-19 immunization for 15 minutes without incident. She was provided with Vaccine Information Sheet and instruction to access the V-Safe system.   Ms. Raylin Diguglielmo was instructed to call 911 with any severe reactions post vaccine: Marland Kitchen Difficulty breathing  . Swelling of face and throat  . A fast heartbeat  . A bad rash all over body  . Dizziness and weakness   Immunizations Administered    Name Date Dose VIS Date Route   PFIZER Comrnaty(Gray TOP) Covid-19 Vaccine 04/18/2020  8:15 AM 0.3 mL 12/30/2019 Intramuscular   Manufacturer: [employer], [employer]   Lot: [id number]   NDC: [phone]

## 2020-04-19 LAB — COMPREHENSIVE METABOLIC PANEL
ALT: 27 IU/L (ref 0–32)
AST: 24 IU/L (ref 0–40)
Albumin/Globulin Ratio: 1.6 (ref 1.2–2.2)
Albumin: 4.2 g/dL (ref 3.8–4.8)
Alkaline Phosphatase: 113 IU/L (ref 44–121)
BUN/Creatinine Ratio: 22 (ref 12–28)
BUN: 17 mg/dL (ref 8–27)
Bilirubin Total: 0.3 mg/dL (ref 0.0–1.2)
CO2: 23 mmol/L (ref 20–29)
Calcium: 9.6 mg/dL (ref 8.7–10.3)
Chloride: 101 mmol/L (ref 96–106)
Creatinine, Ser: 0.78 mg/dL (ref 0.57–1.00)
Globulin, Total: 2.6 g/dL (ref 1.5–4.5)
Glucose: 112 mg/dL — ABNORMAL HIGH (ref 65–99)
Potassium: 3.9 mmol/L (ref 3.5–5.2)
Sodium: 139 mmol/L (ref 134–144)
Total Protein: 6.8 g/dL (ref 6.0–8.5)
eGFR: 82 mL/min/{1.73_m2} (ref >59)

## 2020-04-19 LAB — CBC WITH DIFFERENTIAL/PLATELET
Basophils Absolute: 0.1 10*3/uL (ref 0.0–0.2)
Basos: 1 %
EOS (ABSOLUTE): 0.2 10*3/uL (ref 0.0–0.4)
Eos: 2 %
Hematocrit: 45.3 % (ref 34.0–46.6)
Hemoglobin: 15 g/dL (ref 11.1–15.9)
Immature Grans (Abs): 0 10*3/uL (ref 0.0–0.1)
Immature Granulocytes: 0 %
Lymphocytes Absolute: 4.9 10*3/uL — ABNORMAL HIGH (ref 0.7–3.1)
Lymphs: 52 %
MCH: 29 pg (ref 26.6–33.0)
MCHC: 33.1 g/dL (ref 31.5–35.7)
MCV: 88 fL (ref 79–97)
Monocytes Absolute: 0.7 10*3/uL (ref 0.1–0.9)
Monocytes: 7 %
Neutrophils Absolute: 3.6 10*3/uL (ref 1.4–7.0)
Neutrophils: 38 %
Platelets: 370 10*3/uL (ref 150–450)
RBC: 5.17 x10E6/uL (ref 3.77–5.28)
RDW: 13.2 % (ref 11.7–15.4)
WBC: 9.5 10*3/uL (ref 3.4–10.8)

## 2020-04-19 LAB — HEMOGLOBIN A1C
Est. average glucose Bld gHb Est-mCnc: 169 mg/dL
Hgb A1c MFr Bld: 7.5 % — ABNORMAL HIGH (ref 4.8–5.6)

## 2020-04-19 LAB — CARDIOVASCULAR RISK ASSESSMENT

## 2020-04-19 LAB — LIPID PANEL
Chol/HDL Ratio: 2.4 ratio (ref 0.0–4.4)
Cholesterol, Total: 130 mg/dL (ref 100–199)
HDL: 54 mg/dL (ref >39)
LDL Chol Calc (NIH): 51 mg/dL (ref 0–99)
Triglycerides: 150 mg/dL — ABNORMAL HIGH (ref 0–149)
VLDL Cholesterol Cal: 25 mg/dL (ref 5–40)

## 2020-04-20 ENCOUNTER — Other Ambulatory Visit: Payer: Self-pay

## 2020-04-20 MED ORDER — METFORMIN HCL 500 MG PO TABS: 500.0000 mg | ORAL_TABLET | Freq: Every day | ORAL | 1 refills | Status: DC

## 2020-04-25 DIAGNOSIS — H2513 Age-related nuclear cataract, bilateral: Secondary | ICD-10-CM | POA: Diagnosis not present

## 2020-04-25 DIAGNOSIS — E119 Type 2 diabetes mellitus without complications: Secondary | ICD-10-CM | POA: Diagnosis not present

## 2020-04-25 DIAGNOSIS — H40033 Anatomical narrow angle, bilateral: Secondary | ICD-10-CM | POA: Diagnosis not present

## 2020-04-25 LAB — HM DIABETES EYE EXAM

## 2020-05-22 DIAGNOSIS — E119 Type 2 diabetes mellitus without complications: Secondary | ICD-10-CM | POA: Diagnosis not present

## 2020-05-22 DIAGNOSIS — H2513 Age-related nuclear cataract, bilateral: Secondary | ICD-10-CM | POA: Diagnosis not present

## 2020-05-22 DIAGNOSIS — H40033 Anatomical narrow angle, bilateral: Secondary | ICD-10-CM | POA: Diagnosis not present

## 2020-05-29 ENCOUNTER — Other Ambulatory Visit: Payer: Self-pay | Admitting: Family Medicine

## 2020-06-07 ENCOUNTER — Encounter: Payer: Self-pay | Admitting: Family Medicine

## 2020-08-15 DIAGNOSIS — E119 Type 2 diabetes mellitus without complications: Secondary | ICD-10-CM | POA: Diagnosis not present

## 2020-08-15 DIAGNOSIS — H40033 Anatomical narrow angle, bilateral: Secondary | ICD-10-CM | POA: Diagnosis not present

## 2020-08-15 DIAGNOSIS — H2513 Age-related nuclear cataract, bilateral: Secondary | ICD-10-CM | POA: Diagnosis not present

## 2020-08-17 NOTE — Progress Notes (Signed)
Subjective:  Patient ID: Julia Johns, female    DOB: [birth year]  Age: 70 y.o. MRN: [id number]  Chief Complaint  Patient presents with   Diabetes   Hyperlipidemia     HPI Julia Johns is a 70 year old [name] that presents for follow-up of Type 2 DM, GERD, and hyperlipidemia.She is accompanied by her adult son that is bilingual, serving as Veterinary surgeon. She tells me that she recently returned from a 5-week vacation visiting family in [place] and [place]. States she did not eat as healthy while traveling.   Julia Johns has declined screening mammogram, pap smear, and DEXA scan at this time. She underwent colonoscopy screening 07/04/2017 and recently had diabetic eye exam.   Julia Johns has history of vasculitis with ANCA, treated with [name] with renal and pulmonary involvement. She is followed by specialist.    Diabetes Mellitus Type II, Follow-up  Lab Results  Component Value Date   HGBA1C 7.5 (H) 04/18/2020   HGBA1C 6.8 (H) 01/17/2020   HGBA1C 7.3 (H) 10/15/2019   Wt Readings from Last 3 Encounters:  08/18/20 190 lb (86.2 kg)  04/18/20 192 lb (87.1 kg)  03/29/20 193 lb (87.5 kg)   Last seen for diabetes 3 months ago.  Management since then includes Glucophage 500 mg and Glipizide 5 mg. She reports excellent compliance with treatment. She is not having side effects.  Symptoms: No fatigue No foot ulcerations  No appetite changes No nausea  No paresthesia of the feet  No polydipsia  No polyuria No visual disturbances   No vomiting     Home blood sugar records:  120s-150s  Episodes of hypoglycemia? No   Current insulin regiment:none Most Recent Eye Exam: 2021 Current exercise: housecleaning Current diet habits: well balanced   Lipid/Cholesterol, Follow-up  Last lipid panel Other pertinent labs  Lab Results  Component Value Date   CHOL 130 04/18/2020   HDL 54 04/18/2020   LDLCALC 51 04/18/2020   TRIG 150 (H) 04/18/2020   CHOLHDL 2.4 04/18/2020    Lab Results  Component Value Date   ALT 27 04/18/2020   AST 24 04/18/2020   PLT 370 04/18/2020     She was last seen for this 3 months ago.  Management since that visit includes Crestor 10 mg.  She reports excellent compliance with treatment. She is not having side effects.   Symptoms: No chest pain No chest pressure/discomfort  No dyspnea No lower extremity edema  No numbness or tingling of extremity No orthopnea  No palpitations No paroxysmal nocturnal dyspnea  No speech difficulty No syncope   Current diet: well balanced Current exercise: housecleaning  GERD, Follow up:  The patient was last seen for GERD 3 months ago. Treatment includes Prilosec 20 mg.          She reports good compliance with treatment. She is not having side effects.  She is NOT experiencing belching and eructation    Current Outpatient Medications on File Prior to Visit  Medication Sig Dispense Refill   ACCU-CHEK AVIVA PLUS test strip      Accu-Chek Softclix Lancets lancets USE TO CHECK BLOOD SUGAR TWICE A DAY E11.69 100 each 2   chlorthalidone (HYGROTON) 25 MG tablet Take 0.5 tablets (12.5 mg total) by mouth daily. 90 tablet 1   fluticasone (FLONASE) 50 MCG/ACT nasal spray Place 1 spray into both nostrils daily. (Patient not taking: Reported on 03/29/2020) 16 g 2   glipiZIDE (GLUCOTROL) 5 MG tablet TAKE 1/2 (ONE-HALF) TABLET BY MOUTH BEFORE BREAKFAST AND  1 TABLET IN THE EVENING BEFORE  SUPPER 180 tablet 1   hydrocortisone 1 % ointment Apply 1 application topically 2 (two) times daily. 30 g 0   metFORMIN (GLUCOPHAGE) 500 MG tablet Take 1 tablet (500 mg total) by mouth daily. After the biggest meal. 90 tablet 1   omeprazole (PRILOSEC) 20 MG capsule Take 1 capsule (20 mg total) by mouth daily. (Patient not taking: Reported on 03/29/2020) 30 capsule 1   rosuvastatin (CRESTOR) 10 MG tablet Take 1 tablet (10 mg total) by mouth daily. 90 tablet 1   No current facility-administered medications on file prior to  visit.   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[place])    Past Surgical History:  Procedure Laterality Date   VIDEO BRONCHOSCOPY Bilateral 06/18/2018   Procedure: VIDEO BRONCHOSCOPY WITH FLUORO;  Surgeon: Rigoberto Noel, MD;  Location[facility] ENDOSCOPY;  Service: Cardiopulmonary;  Laterality: Bilateral;   VIDEO BRONCHOSCOPY WITH ENDOBRONCHIAL ULTRASOUND N/A 06/25/2018   Procedure: VIDEO BRONCHOSCOPY WITH ENDOBRONCHIAL ULTRASOUND;  Surgeon: Rigoberto Noel, MD;  Location: [facility] OR;  Service: Thoracic;  Laterality: N/A;    Family History  Problem Relation Age of Onset   Uterine cancer Mother    Social History   Socioeconomic History   Marital status: Single    Spouse name: Not on file   Number of children: Not on file   Years of education: Not on file   Highest education level: Not on file  Occupational History   Not on file  Tobacco Use   Smoking status: Never   Smokeless tobacco: Never  Substance and Sexual Activity   Alcohol use: Never   Drug use: Never   Sexual activity: Not Currently  Other Topics Concern   Not on file  Social History Narrative   Patient primary language is spanish, she lives at home with her son Julia Johns and daughter in law Julia Johns    Social Determinants of Health   Financial Resource Strain: Not on file  Food Insecurity: Not on file  Transportation Needs: Not on file  Physical Activity: Not on file  Stress: Not on file  Social Connections: Not on file    Review of Systems  Constitutional:  Negative for chills, fatigue and fever.  HENT:  Positive for postnasal drip. Negative for congestion, ear pain, rhinorrhea and sore throat.   Eyes: Negative.   Respiratory:  Negative for cough and shortness of breath.   Cardiovascular:  Negative for chest pain.  Gastrointestinal:  Negative for abdominal pain, constipation, diarrhea,  nausea and vomiting.  Endocrine: Negative.   Genitourinary:  Negative for dysuria and urgency.  Musculoskeletal:  Negative for back pain and myalgias.  Allergic/Immunologic: Negative.   Neurological:  Negative for dizziness, weakness, light-headedness and headaches.  Hematological: Negative.   Psychiatric/Behavioral:  Negative for dysphoric mood. The patient is not nervous/anxious.     Objective:  BP 108/62   Pulse 70   Temp 97.6 F (36.4 C)   Resp 15   Ht 4\' 9"  (1.448 m)   Wt 190 lb (86.2 kg)   SpO2 93%   BMI 41.12 kg/m    BP/Weight 04/18/2020 09/29/2424 08/24/4194  Systolic BP 222 979 892  Diastolic BP 76 70 64  Wt. (Lbs) 192 193 192  BMI 41.55 41.76 41.55    Physical Exam Vitals reviewed.  Constitutional:  Appearance: Normal appearance. She is normal weight.  Neck:     Vascular: No carotid bruit.  Cardiovascular:     Rate and Rhythm: Normal rate and regular rhythm.     Pulses: Normal pulses.     Heart sounds: Normal heart sounds.  Pulmonary:     Effort: Pulmonary effort is normal. No respiratory distress.     Breath sounds: Normal breath sounds.  Abdominal:     General: Abdomen is flat. Bowel sounds are normal.     Palpations: Abdomen is soft.     Tenderness: There is no abdominal tenderness.  Neurological:     Mental Status: She is alert and oriented to person, place, and time.  Psychiatric:        Mood and Affect: Mood normal.        Behavior: Behavior normal.     Lab Results  Component Value Date   WBC 9.5 04/18/2020   HGB 15.0 04/18/2020   HCT 45.3 04/18/2020   PLT 370 04/18/2020   GLUCOSE 112 (H) 04/18/2020   CHOL 130 04/18/2020   TRIG 150 (H) 04/18/2020   HDL 54 04/18/2020   LDLCALC 51 04/18/2020   ALT 27 04/18/2020   AST 24 04/18/2020   NA 139 04/18/2020   K 3.9 04/18/2020   CL 101 04/18/2020   CREATININE 0.78 04/18/2020   BUN 17 04/18/2020   CO2 23 04/18/2020   INR 1.2 07/05/2018   HGBA1C 7.5 (H) 04/18/2020   MICROALBUR 30 04/18/2020       Assessment & Plan:   1. Dyslipidemia associated with type 2 diabetes mellitus (HCC)-stable - CBC with Differential/Platelet - Lipid panel -continue Crestor 10 mg  2. Mixed hyperlipidemia-stable - Comprehensive metabolic panel - Hemoglobin A1c -heart healthy diet  3. Type 2 diabetes mellitus with hyperglycemia, without long-term current use of insulin (HCC) - Hemoglobin A1c -Continue Metformin 500 mg -Continue Glipizide 5 mg   4. Mammogram declined  5. ANCA-associated vasculitis (HCC)-stable -Follow up with specialist    Continue medications Follow-up in 84-months  Follow-up: 52-months  An After Visit Summary was printed and given to the patient.  Rip Harbour, NP [place] [phone]

## 2020-08-18 ENCOUNTER — Other Ambulatory Visit: Payer: Self-pay | Admitting: Nurse Practitioner

## 2020-08-18 ENCOUNTER — Other Ambulatory Visit: Payer: Self-pay

## 2020-08-18 ENCOUNTER — Encounter: Payer: Self-pay | Admitting: Nurse Practitioner

## 2020-08-18 ENCOUNTER — Ambulatory Visit (INDEPENDENT_AMBULATORY_CARE_PROVIDER_SITE_OTHER): Payer: Medicare HMO | Admitting: Nurse Practitioner

## 2020-08-18 ENCOUNTER — Ambulatory Visit: Payer: Medicare HMO | Admitting: Family Medicine

## 2020-08-18 VITALS — BP 108/62 | HR 70 | Temp 97.6°F | Resp 15 | Ht <= 58 in | Wt 190.0 lb

## 2020-08-18 DIAGNOSIS — E1165 Type 2 diabetes mellitus with hyperglycemia: Secondary | ICD-10-CM | POA: Diagnosis not present

## 2020-08-18 DIAGNOSIS — E785 Hyperlipidemia, unspecified: Secondary | ICD-10-CM

## 2020-08-18 DIAGNOSIS — E782 Mixed hyperlipidemia: Secondary | ICD-10-CM | POA: Diagnosis not present

## 2020-08-18 DIAGNOSIS — E1169 Type 2 diabetes mellitus with other specified complication: Secondary | ICD-10-CM

## 2020-08-18 DIAGNOSIS — I776 Arteritis, unspecified: Secondary | ICD-10-CM | POA: Diagnosis not present

## 2020-08-18 DIAGNOSIS — Z5320 Procedure and treatment not carried out because of patient's decision for unspecified reasons: Secondary | ICD-10-CM | POA: Diagnosis not present

## 2020-08-18 DIAGNOSIS — I7782 Antineutrophilic cytoplasmic antibody (ANCA) vasculitis: Secondary | ICD-10-CM

## 2020-08-18 MED ORDER — ROSUVASTATIN CALCIUM 10 MG PO TABS: 10.0000 mg | ORAL_TABLET | Freq: Every day | ORAL | 1 refills | Status: DC

## 2020-08-18 NOTE — Patient Instructions (Signed)
Continue medications °Follow-up in 3-months ° ° ° °Diabetes Mellitus and Foot Care °Foot care is an important part of your health, especially when you have diabetes. Diabetes may cause you to have problems because of poor blood flow (circulation) to your feet and legs, which can cause your skin to: °Become thinner and drier. °Break more easily. °Heal more slowly. °Peel and crack. °You may also have nerve damage (neuropathy) in your legs and feet, causing decreased feeling in them. This means that you may not notice minor injuries to your feet that could lead to more serious problems. Noticing and addressing any potential problems early is the best way to prevent future foot problems. °How to care for your feet °Foot hygiene ° °Wash your feet daily with warm water and mild soap. Do not use hot water. Then, pat your feet and the areas between your toes until they are completely dry. Do not soak your feet as this can dry your skin. °Trim your toenails straight across. Do not dig under them or around the cuticle. File the edges of your nails with an emery board or nail file. °Apply a moisturizing lotion or petroleum jelly to the skin on your feet and to dry, brittle toenails. Use lotion that does not contain alcohol and is unscented. Do not apply lotion between your toes. °Shoes and socks °Wear clean socks or stockings every day. Make sure they are not too tight. Do not wear knee-high stockings since they may decrease blood flow to your legs. °Wear shoes that fit properly and have enough cushioning. Always look in your shoes before you put them on to be sure there are no objects inside. °To break in new shoes, wear them for just a few hours a day. This prevents injuries on your feet. °Wounds, scrapes, corns, and calluses ° °Check your feet daily for blisters, cuts, bruises, sores, and redness. If you cannot see the bottom of your feet, use a mirror or ask someone for help. °Do not cut corns or calluses or try to remove  them with medicine. °If you find a minor scrape, cut, or break in the skin on your feet, keep it and the skin around it clean and dry. You may clean these areas with mild soap and water. Do not clean the area with peroxide, alcohol, or iodine. °If you have a wound, scrape, corn, or callus on your foot, look at it several times a day to make sure it is healing and not infected. Check for: °Redness, swelling, or pain. °Fluid or blood. °Warmth. °Pus or a bad smell. °General tips °Do not cross your legs. This may decrease blood flow to your feet. °Do not use heating pads or hot water bottles on your feet. They may burn your skin. If you have lost feeling in your feet or legs, you may not know this is happening until it is too late. °Protect your feet from hot and cold by wearing shoes, such as at the beach or on hot pavement. °Schedule a complete foot exam at least once a year (annually) or more often if you have foot problems. Report any cuts, sores, or bruises to your health care provider immediately. °Where to find more information °American Diabetes Association: www.diabetes.org °Association of Diabetes Care & Education Specialists: www.diabeteseducator.org °Contact a health care provider if: °You have a medical condition that increases your risk of infection and you have any cuts, sores, or bruises on your feet. °You have an injury that is not healing. °You   have redness on your legs or feet. °You feel burning or tingling in your legs or feet. °You have pain or cramps in your legs and feet. °Your legs or feet are numb. °Your feet always feel cold. °You have pain around any toenails. °Get help right away if: °You have a wound, scrape, corn, or callus on your foot and: °You have pain, swelling, or redness that gets worse. °You have fluid or blood coming from the wound, scrape, corn, or callus. °Your wound, scrape, corn, or callus feels warm to the touch. °You have pus or a bad smell coming from the wound, scrape,  corn, or callus. °You have a fever. °You have a red line going up your leg. °Summary °Check your feet every day for blisters, cuts, bruises, sores, and redness. °Apply a moisturizing lotion or petroleum jelly to the skin on your feet and to dry, brittle toenails. °Wear shoes that fit properly and have enough cushioning. °If you have foot problems, report any cuts, sores, or bruises to your health care provider immediately. °Schedule a complete foot exam at least once a year (annually) or more often if you have foot problems. °This information is not intended to replace advice given to you by your health care provider. Make sure you discuss any questions you have with your health care provider. °Document Revised: 07/29/2019 Document Reviewed: 07/29/2019 °Elsevier Patient Education © 2022 Elsevier Inc. ° °

## 2020-08-19 LAB — COMPREHENSIVE METABOLIC PANEL
ALT: 37 IU/L — ABNORMAL HIGH (ref 0–32)
AST: 38 IU/L (ref 0–40)
Albumin/Globulin Ratio: 1.9 (ref 1.2–2.2)
Albumin: 4.1 g/dL (ref 3.8–4.8)
Alkaline Phosphatase: 104 IU/L (ref 44–121)
BUN/Creatinine Ratio: 20 (ref 12–28)
BUN: 13 mg/dL (ref 8–27)
Bilirubin Total: 0.4 mg/dL (ref 0.0–1.2)
CO2: 23 mmol/L (ref 20–29)
Calcium: 9.6 mg/dL (ref 8.7–10.3)
Chloride: 102 mmol/L (ref 96–106)
Creatinine, Ser: 0.64 mg/dL (ref 0.57–1.00)
Globulin, Total: 2.2 g/dL (ref 1.5–4.5)
Glucose: 128 mg/dL — ABNORMAL HIGH (ref 65–99)
Potassium: 4 mmol/L (ref 3.5–5.2)
Sodium: 140 mmol/L (ref 134–144)
Total Protein: 6.3 g/dL (ref 6.0–8.5)
eGFR: 95 mL/min/{1.73_m2} (ref >59)

## 2020-08-19 LAB — CBC WITH DIFFERENTIAL/PLATELET
Basophils Absolute: 0.1 10*3/uL (ref 0.0–0.2)
Basos: 1 %
EOS (ABSOLUTE): 0.4 10*3/uL (ref 0.0–0.4)
Eos: 5 %
Hematocrit: 45.1 % (ref 34.0–46.6)
Hemoglobin: 14.7 g/dL (ref 11.1–15.9)
Immature Grans (Abs): 0 10*3/uL (ref 0.0–0.1)
Immature Granulocytes: 0 %
Lymphocytes Absolute: 3.8 10*3/uL — ABNORMAL HIGH (ref 0.7–3.1)
Lymphs: 44 %
MCH: 28.9 pg (ref 26.6–33.0)
MCHC: 32.6 g/dL (ref 31.5–35.7)
MCV: 89 fL (ref 79–97)
Monocytes Absolute: 0.5 10*3/uL (ref 0.1–0.9)
Monocytes: 6 %
Neutrophils Absolute: 3.8 10*3/uL (ref 1.4–7.0)
Neutrophils: 44 %
Platelets: 362 10*3/uL (ref 150–450)
RBC: 5.08 x10E6/uL (ref 3.77–5.28)
RDW: 14.1 % (ref 11.7–15.4)
WBC: 8.6 10*3/uL (ref 3.4–10.8)

## 2020-08-19 LAB — LIPID PANEL
Chol/HDL Ratio: 2.3 ratio (ref 0.0–4.4)
Cholesterol, Total: 114 mg/dL (ref 100–199)
HDL: 50 mg/dL (ref >39)
LDL Chol Calc (NIH): 37 mg/dL (ref 0–99)
Triglycerides: 165 mg/dL — ABNORMAL HIGH (ref 0–149)
VLDL Cholesterol Cal: 27 mg/dL (ref 5–40)

## 2020-08-19 LAB — HEMOGLOBIN A1C
Est. average glucose Bld gHb Est-mCnc: 151 mg/dL
Hgb A1c MFr Bld: 6.9 % — ABNORMAL HIGH (ref 4.8–5.6)

## 2020-08-19 LAB — CARDIOVASCULAR RISK ASSESSMENT

## 2020-08-22 DIAGNOSIS — I10 Essential (primary) hypertension: Secondary | ICD-10-CM | POA: Diagnosis not present

## 2020-08-22 DIAGNOSIS — R809 Proteinuria, unspecified: Secondary | ICD-10-CM | POA: Diagnosis not present

## 2020-08-22 DIAGNOSIS — I776 Arteritis, unspecified: Secondary | ICD-10-CM | POA: Diagnosis not present

## 2020-09-01 DIAGNOSIS — H25811 Combined forms of age-related cataract, right eye: Secondary | ICD-10-CM | POA: Diagnosis not present

## 2020-09-15 DIAGNOSIS — H25812 Combined forms of age-related cataract, left eye: Secondary | ICD-10-CM | POA: Diagnosis not present

## 2020-09-16 DIAGNOSIS — H25812 Combined forms of age-related cataract, left eye: Secondary | ICD-10-CM | POA: Diagnosis not present

## 2020-11-24 ENCOUNTER — Encounter: Payer: Self-pay | Admitting: Family Medicine

## 2020-11-24 ENCOUNTER — Ambulatory Visit (INDEPENDENT_AMBULATORY_CARE_PROVIDER_SITE_OTHER): Payer: Medicare HMO

## 2020-11-24 ENCOUNTER — Other Ambulatory Visit: Payer: Self-pay

## 2020-11-24 ENCOUNTER — Ambulatory Visit (INDEPENDENT_AMBULATORY_CARE_PROVIDER_SITE_OTHER): Payer: Medicare HMO | Admitting: Family Medicine

## 2020-11-24 VITALS — BP 110/60 | HR 72 | Temp 97.3°F | Resp 18 | Ht <= 58 in | Wt 192.0 lb

## 2020-11-24 DIAGNOSIS — E782 Mixed hyperlipidemia: Secondary | ICD-10-CM | POA: Diagnosis not present

## 2020-11-24 DIAGNOSIS — E1165 Type 2 diabetes mellitus with hyperglycemia: Secondary | ICD-10-CM | POA: Diagnosis not present

## 2020-11-24 DIAGNOSIS — E785 Hyperlipidemia, unspecified: Secondary | ICD-10-CM

## 2020-11-24 DIAGNOSIS — E1169 Type 2 diabetes mellitus with other specified complication: Secondary | ICD-10-CM

## 2020-11-24 DIAGNOSIS — Z23 Encounter for immunization: Secondary | ICD-10-CM

## 2020-11-24 DIAGNOSIS — L719 Rosacea, unspecified: Secondary | ICD-10-CM

## 2020-11-24 DIAGNOSIS — M170 Bilateral primary osteoarthritis of knee: Secondary | ICD-10-CM

## 2020-11-24 DIAGNOSIS — Z6841 Body Mass Index (BMI) 40.0 and over, adult: Secondary | ICD-10-CM

## 2020-11-24 DIAGNOSIS — I10 Essential (primary) hypertension: Secondary | ICD-10-CM | POA: Diagnosis not present

## 2020-11-24 MED ORDER — METRONIDAZOLE 1 % EX GEL: Freq: Every day | CUTANEOUS | 0 refills | Status: DC

## 2020-11-24 NOTE — Progress Notes (Signed)
Subjective:  Patient ID: Julia Johns, female    DOB: 1950-08-13  Age: 70 y.o. MRN: [id number]  Chief Complaint  Patient presents with   Diabetes   Hyperlipidemia   Hypertension    HPI Diabetes:  Complications: Dyslipidemia. Glucose checking: Once a week and the level are 110 mg/dl. Glucose logs: no Hypoglycemia: no Most recent A1C: 6.9 Current medications:  Last Eye Exam: She had eye surgery for cataracts. Foot checks: no  Hyperlipidemia: Current medications: Rosuvastatin 10 mg daily.  Hypertension: Patient is taking Chlorthalidone 12.5mg  daily.  Diet: Eats smaller quantities and fruits and vegetable. Breads. Exercise: Patient is very active.    Current Outpatient Medications on File Prior to Visit  Medication Sig Dispense Refill   ACCU-CHEK AVIVA PLUS test strip      Accu-Chek Softclix Lancets lancets USE TO CHECK BLOOD SUGAR TWICE A DAY E11.69 100 each 2   chlorthalidone (HYGROTON) 25 MG tablet Take 0.5 tablets (12.5 mg total) by mouth daily. 90 tablet 1   glipiZIDE (GLUCOTROL) 5 MG tablet TAKE 1/2 (ONE-HALF) TABLET BY MOUTH BEFORE BREAKFAST AND 1 TABLET IN THE EVENING BEFORE  SUPPER 180 tablet 1   metFORMIN (GLUCOPHAGE) 500 MG tablet Take 1 tablet (500 mg total) by mouth daily. After the biggest meal. 90 tablet 1   rosuvastatin (CRESTOR) 10 MG tablet Take 1 tablet (10 mg total) by mouth daily. 90 tablet 1   No current facility-administered medications on file prior to visit.   Past Medical History:  Diagnosis Date   Chronic kidney disease    Chronic respiratory failure with hypoxia (HCC)    10/09/2018-overnight oximetry- duration of sleep 7 hours and 34 minutes, on room air, SPO2 less than 88% 1 hour and 34 minutes and 28 seconds.    Diabetes mellitus without complication ([place])    Grade II hemorrhoids 07/03/2017   Last Assessment & Plan:  Formatting of this note might be different from the original. Fiber supplement stool softener adequate hydration and  local hemorrhoid management is advisable.   Vasculitis ([place])    Vasculitis, ANCA positive    Past Surgical History:  Procedure Laterality Date   VIDEO BRONCHOSCOPY Bilateral 06/18/2018   Procedure: VIDEO BRONCHOSCOPY WITH FLUORO;  Surgeon: Rigoberto Noel, MD;  Location: [facility];  Service: Cardiopulmonary;  Laterality: Bilateral;   VIDEO BRONCHOSCOPY WITH ENDOBRONCHIAL ULTRASOUND N/A 06/25/2018   Procedure: VIDEO BRONCHOSCOPY WITH ENDOBRONCHIAL ULTRASOUND;  Surgeon: Rigoberto Noel, MD;  Location: [facility] OR;  Service: Thoracic;  Laterality: N/A;    Family History  Problem Relation Age of Onset   Uterine cancer Mother    Social History   Socioeconomic History   Marital status: Single    Spouse name: Not on file   Number of children: Not on file   Years of education: Not on file   Highest education level: Not on file  Occupational History   Not on file  Tobacco Use   Smoking status: Never   Smokeless tobacco: Never  Substance and Sexual Activity   Alcohol use: Never   Drug use: Never   Sexual activity: Not Currently  Other Topics Concern   Not on file  Social History Narrative   Patient primary language is spanish, she lives at home with her son Julia Johns and daughter in law Julia Johns    Social Determinants of Health   Financial Resource Strain: Not on file  Food Insecurity: Not on file  Transportation Needs: Not on file  Physical Activity: Not  on file  Stress: Not on file  Social Connections: Not on file    Review of Systems  Constitutional:  Negative for chills, fatigue and fever.  HENT:  Negative for congestion, rhinorrhea and sore throat.   Eyes:  Positive for visual disturbance (had bilateral cataract surgery).  Respiratory:  Negative for cough and shortness of breath.   Cardiovascular:  Negative for chest pain.  Gastrointestinal:  Negative for abdominal pain, constipation, diarrhea, nausea and vomiting.  Genitourinary:  Negative for dysuria and urgency.   Musculoskeletal:  Positive for arthralgias (left knee pain). Negative for back pain and myalgias.  Skin:  Positive for rash.  Neurological:  Negative for dizziness, weakness, light-headedness and headaches.  Psychiatric/Behavioral:  Negative for dysphoric mood. The patient is not nervous/anxious.     Objective:  BP 110/60   Pulse 72   Temp (!) 97.3 F (36.3 C)   Resp 18   Ht 4\' 9"  (1.448 m)   Wt 192 lb (87.1 kg)   BMI 41.55 kg/m   BP/Weight 11/24/2020 08/18/2020 0/96/2836  Systolic BP 629 476 546  Diastolic BP 60 62 76  Wt. (Lbs) 192 190 192  BMI 41.55 41.12 41.55    Physical Exam Vitals reviewed.  Constitutional:      Appearance: She is obese.  HENT:     Right Ear: Tympanic membrane normal.     Left Ear: Tympanic membrane normal.     Nose: Nose normal.     Mouth/Throat:     Pharynx: No oropharyngeal exudate or posterior oropharyngeal erythema.  Neck:     Vascular: No carotid bruit.  Cardiovascular:     Rate and Rhythm: Normal rate and regular rhythm.     Pulses: Normal pulses.     Heart sounds: Normal heart sounds.  Pulmonary:     Effort: Pulmonary effort is normal.     Breath sounds: Normal breath sounds.  Abdominal:     General: Bowel sounds are normal.     Palpations: Abdomen is soft.     Tenderness: There is no abdominal tenderness.  Lymphadenopathy:     Cervical: No cervical adenopathy.  Skin:    Findings: No lesion or rash.     Comments: Rosacea. Nodular acne.  Neurological:     Mental Status: She is alert and oriented to person, place, and time.  Psychiatric:        Mood and Affect: Mood normal.        Behavior: Behavior normal.    Diabetic Foot Exam - Simple   Simple Foot Form  11/24/2020 10:14 AM  Visual Inspection See comments: Yes Sensation Testing Intact to touch and monofilament testing bilaterally: Yes Pulse Check Posterior Tibialis and Dorsalis pulse intact bilaterally: Yes Comments Feet with dry skin and calluses on the bottom.       Lab Results  Component Value Date   WBC 9.5 11/24/2020   HGB 14.8 11/24/2020   HCT 45.4 11/24/2020   PLT 331 11/24/2020   GLUCOSE 123 (H) 11/24/2020   CHOL 127 11/24/2020   TRIG 125 11/24/2020   HDL 56 11/24/2020   LDLCALC 49 11/24/2020   ALT 25 11/24/2020   AST 25 11/24/2020   NA 141 11/24/2020   K 3.9 11/24/2020   CL 101 11/24/2020   CREATININE 0.67 11/24/2020   BUN 18 11/24/2020   CO2 25 11/24/2020   INR 1.2 07/05/2018   HGBA1C 7.2 (H) 11/24/2020   MICROALBUR 30 04/18/2020      Assessment & Plan:  Problem List Items Addressed This Visit       Cardiovascular and Mediastinum   Benign hypertension    Well controlled.  No changes to medicines.  Continue to work on eating a healthy diet and exercise.  Labs drawn today.         Endocrine   Dyslipidemia associated with type 2 diabetes mellitus ([place])    Continue crestor 10 mg once daily.      Relevant Orders   CBC with Differential/Platelet (Completed)   Comprehensive metabolic panel (Completed)   Lipid panel (Completed)   Type 2 diabetes mellitus with hyperglycemia, without long-term current use of insulin (HCC)    Control: Fair Recommend check sugars fasting daily. Recommend check feet daily. Recommend annual eye exams. Medicines: No changes. Continue to work on eating a healthy diet and exercise.  Labs drawn today.        Relevant Orders   Hemoglobin A1c (Completed)     Musculoskeletal and Integument   Primary osteoarthritis of both knees    Take tylenol 500 mg BID PRN.      Rosacea, acne    Start Metrogel 1% gel apply topically daily.        Other   Mixed hyperlipidemia    Recommend continue to work on eating healthy diet and exercise. Continue crestor 10 mg once daily.       Morbid obesity with BMI of 40.0-44.9, adult ([place])    Recommend continue to work on eating healthy diet and exercise.      Other Visit Diagnoses     Need for influenza vaccination    -  Primary   Relevant  Orders   Flu Vaccine QUAD High Dose(Fluad) (Completed)     .  Meds ordered this encounter  Medications   metroNIDAZOLE (METROGEL) 1 % gel    Sig: Apply topically daily.    Dispense:  45 g    Refill:  0    Orders Placed This Encounter  Procedures   Flu Vaccine QUAD High Dose(Fluad)   CBC with Differential/Platelet   Comprehensive metabolic panel   Hemoglobin A1c   Lipid panel   Cardiovascular Risk Assessment    I,Darus Hershman,acting as a scribe for [name], MD.,have documented all relevant documentation on the behalf of [name], MD,as directed by  [name], MD while in the presence of [name], MD.   Follow-up: No follow-ups on file.  An After Visit Summary was printed and given to the patient.  [name], MD [facility] [phone]

## 2020-11-25 LAB — COMPREHENSIVE METABOLIC PANEL
ALT: 25 IU/L (ref 0–32)
AST: 25 IU/L (ref 0–40)
Albumin/Globulin Ratio: 1.7 (ref 1.2–2.2)
Albumin: 4.2 g/dL (ref 3.8–4.8)
Alkaline Phosphatase: 115 IU/L (ref 44–121)
BUN/Creatinine Ratio: 27 (ref 12–28)
BUN: 18 mg/dL (ref 8–27)
Bilirubin Total: 0.5 mg/dL (ref 0.0–1.2)
CO2: 25 mmol/L (ref 20–29)
Calcium: 9.8 mg/dL (ref 8.7–10.3)
Chloride: 101 mmol/L (ref 96–106)
Creatinine, Ser: 0.67 mg/dL (ref 0.57–1.00)
Globulin, Total: 2.5 g/dL (ref 1.5–4.5)
Glucose: 123 mg/dL — ABNORMAL HIGH (ref 70–99)
Potassium: 3.9 mmol/L (ref 3.5–5.2)
Sodium: 141 mmol/L (ref 134–144)
Total Protein: 6.7 g/dL (ref 6.0–8.5)
eGFR: 94 mL/min/{1.73_m2} (ref >59)

## 2020-11-25 LAB — HEMOGLOBIN A1C
Est. average glucose Bld gHb Est-mCnc: 160 mg/dL
Hgb A1c MFr Bld: 7.2 % — ABNORMAL HIGH (ref 4.8–5.6)

## 2020-11-25 LAB — CBC WITH DIFFERENTIAL/PLATELET
Basophils Absolute: 0.1 10*3/uL (ref 0.0–0.2)
Basos: 1 %
EOS (ABSOLUTE): 0.3 10*3/uL (ref 0.0–0.4)
Eos: 3 %
Hematocrit: 45.4 % (ref 34.0–46.6)
Hemoglobin: 14.8 g/dL (ref 11.1–15.9)
Immature Grans (Abs): 0 10*3/uL (ref 0.0–0.1)
Immature Granulocytes: 0 %
Lymphocytes Absolute: 4.4 10*3/uL — ABNORMAL HIGH (ref 0.7–3.1)
Lymphs: 47 %
MCH: 29.1 pg (ref 26.6–33.0)
MCHC: 32.6 g/dL (ref 31.5–35.7)
MCV: 89 fL (ref 79–97)
Monocytes Absolute: 0.6 10*3/uL (ref 0.1–0.9)
Monocytes: 6 %
Neutrophils Absolute: 4.1 10*3/uL (ref 1.4–7.0)
Neutrophils: 43 %
Platelets: 331 10*3/uL (ref 150–450)
RBC: 5.08 x10E6/uL (ref 3.77–5.28)
RDW: 13.5 % (ref 11.7–15.4)
WBC: 9.5 10*3/uL (ref 3.4–10.8)

## 2020-11-25 LAB — LIPID PANEL
Chol/HDL Ratio: 2.3 ratio (ref 0.0–4.4)
Cholesterol, Total: 127 mg/dL (ref 100–199)
HDL: 56 mg/dL (ref >39)
LDL Chol Calc (NIH): 49 mg/dL (ref 0–99)
Triglycerides: 125 mg/dL (ref 0–149)
VLDL Cholesterol Cal: 22 mg/dL (ref 5–40)

## 2020-11-26 ENCOUNTER — Encounter: Payer: Self-pay | Admitting: Family Medicine

## 2020-11-26 DIAGNOSIS — E1165 Type 2 diabetes mellitus with hyperglycemia: Secondary | ICD-10-CM | POA: Insufficient documentation

## 2020-11-26 DIAGNOSIS — L719 Rosacea, unspecified: Secondary | ICD-10-CM | POA: Insufficient documentation

## 2020-11-26 DIAGNOSIS — M170 Bilateral primary osteoarthritis of knee: Secondary | ICD-10-CM | POA: Insufficient documentation

## 2020-11-26 DIAGNOSIS — I10 Essential (primary) hypertension: Secondary | ICD-10-CM | POA: Insufficient documentation

## 2020-11-26 DIAGNOSIS — E1159 Type 2 diabetes mellitus with other circulatory complications: Secondary | ICD-10-CM | POA: Insufficient documentation

## 2020-11-26 DIAGNOSIS — E1121 Type 2 diabetes mellitus with diabetic nephropathy: Secondary | ICD-10-CM | POA: Insufficient documentation

## 2020-11-26 NOTE — Assessment & Plan Note (Signed)
Start Metrogel 1% gel apply topically daily.

## 2020-11-26 NOTE — Assessment & Plan Note (Signed)
Recommend continue to work on eating healthy diet and exercise.  

## 2020-11-26 NOTE — Assessment & Plan Note (Signed)
Well controlled.  ?No changes to medicines.  ?Continue to work on eating a healthy diet and exercise.  ?Labs drawn today.  ?

## 2020-11-26 NOTE — Assessment & Plan Note (Signed)
Take tylenol 500 mg BID PRN.

## 2020-11-26 NOTE — Assessment & Plan Note (Signed)
Control: Fair Recommend check sugars fasting daily. Recommend check feet daily. Recommend annual eye exams. Medicines: No changes. Continue to work on eating a healthy diet and exercise.  Labs drawn today.

## 2020-12-02 ENCOUNTER — Encounter: Payer: Self-pay | Admitting: Family Medicine

## 2020-12-02 IMAGING — DX PORTABLE ABDOMEN - 1 VIEW
1 series · 1 of 1 positions shown · non-contrast
Comparison: CT abdomen and pelvis 06/03/2018

CLINICAL DATA: Abdominal pain and distension.

EXAM:
PORTABLE ABDOMEN - 1 VIEW

[abdomen]
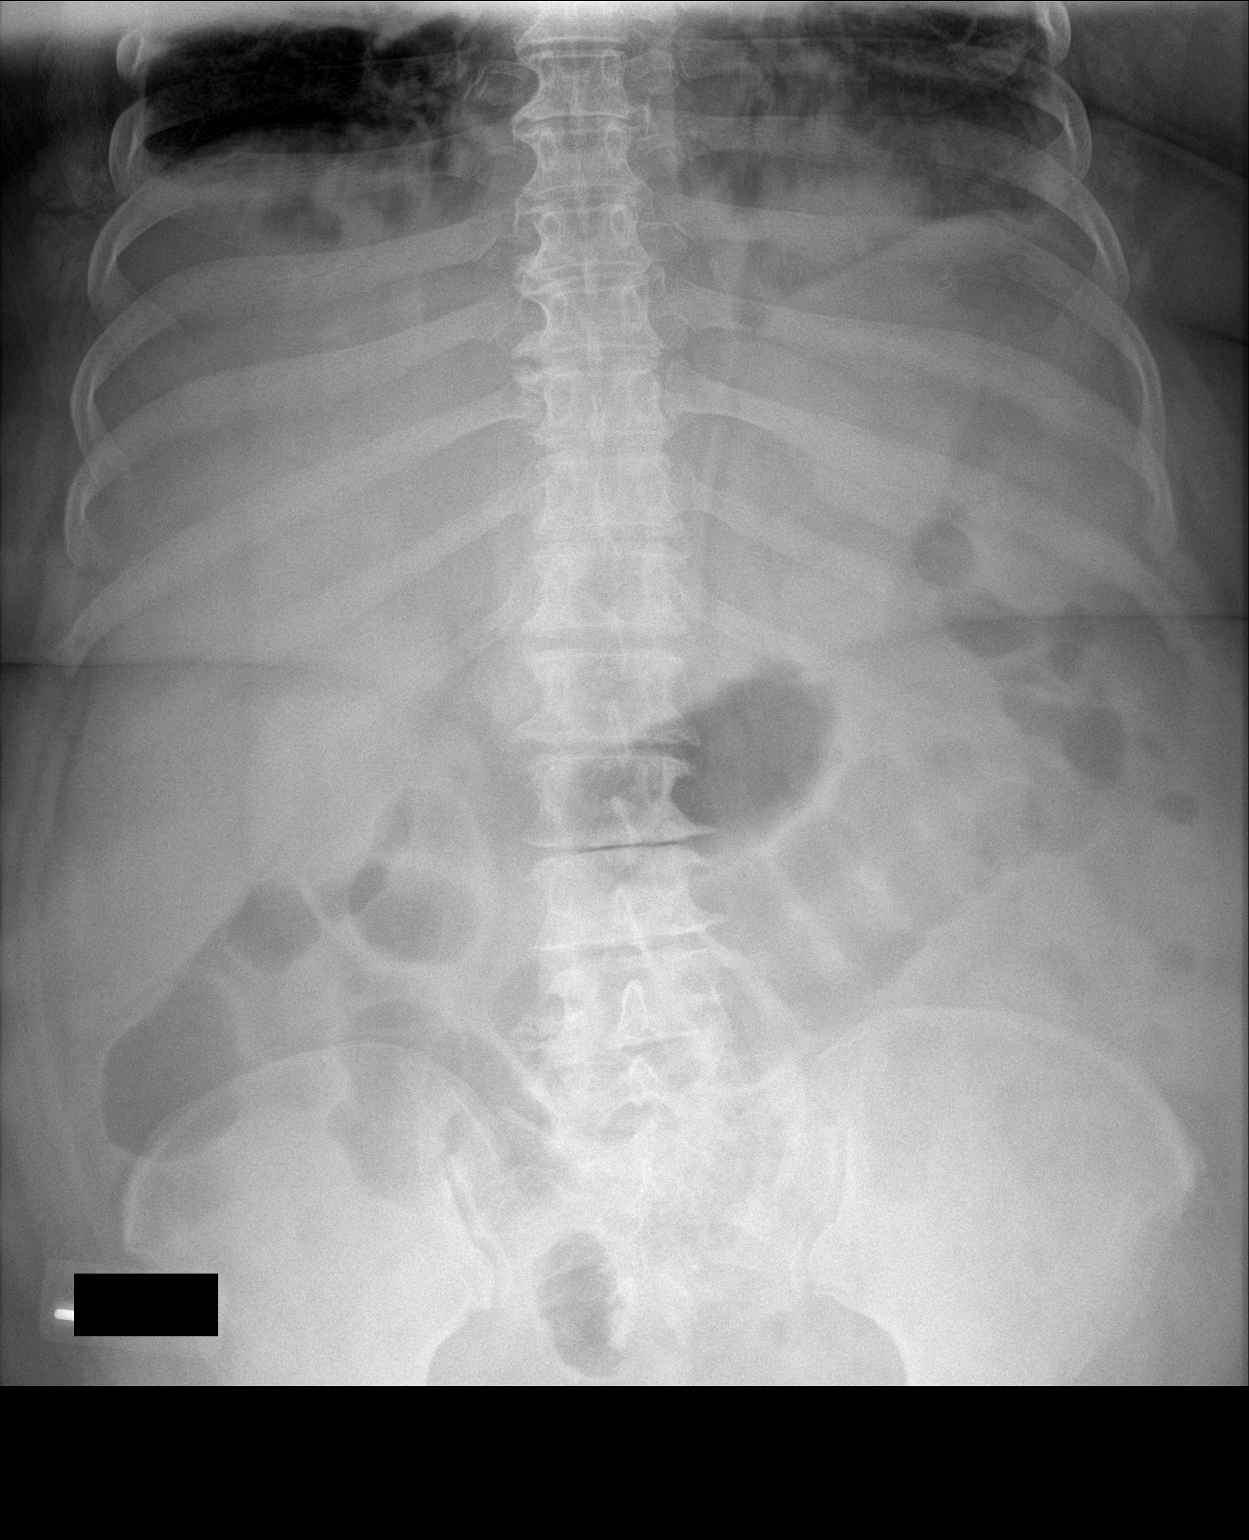

[1 of 1 positions shown; findings below may reference images not displayed]

FINDINGS: Gas is present in scattered nondilated loops of bowel without
evidence of obstruction. No gross intraperitoneal free air is
identified on this supine study. Thoracolumbar spondylosis is noted.
The lung bases are not well evaluated due to motion artifact.
IMPRESSION: Nonobstructed bowel gas pattern.

## 2020-12-02 NOTE — Assessment & Plan Note (Signed)
Continue crestor 10 mg once daily.

## 2020-12-02 NOTE — Assessment & Plan Note (Signed)
Recommend continue to work on eating healthy diet and exercise. Continue crestor 10 mg once daily.

## 2020-12-05 IMAGING — CT CT ABDOMEN AND PELVIS WITH CONTRAST
2 of 5 series · 16 of 46 positions shown, 18 images · IV contrast (APPLIED)
Comparison: CT abdomen pelvis, 06/03/2018, CT chest, 06/05/2018, MR
thoracic spine, 06/05/2018

CLINICAL DATA: Abdominal distention, nausea, vomiting, pain,
colitis, paravertebral phlegmon

EXAM:
CT ABDOMEN AND PELVIS WITH CONTRAST
TECHNIQUE: Multidetector CT imaging of the abdomen and pelvis was performed
using the standard protocol following bolus administration of
intravenous contrast.
CONTRAST:  100mL OMNIPAQUE IOHEXOL 300 MG/ML SOLN, additional oral
enteric contrast

[Series 3: abdomen 5.0 · axial · 0.98mm/px · z∈[+696,+1142]mm · 13 of 101 slices shown, 15 images]
[im 6/101  soft-tissue]
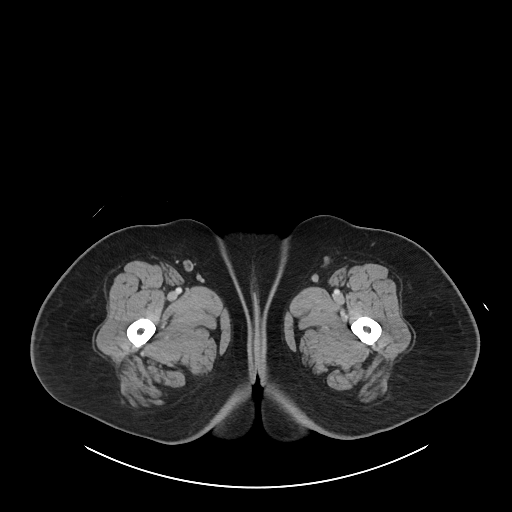
[im 6/101  bone]
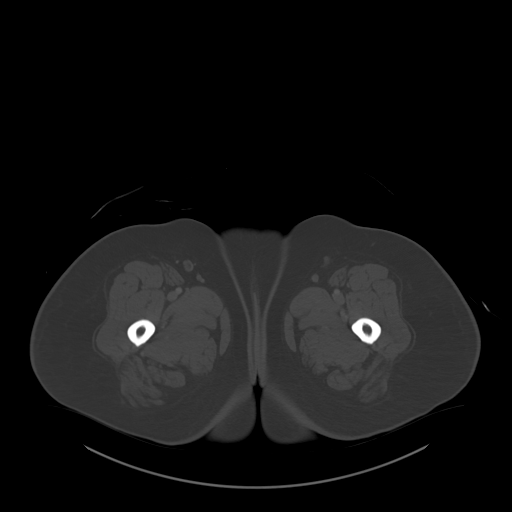
[im 12/101  soft-tissue]
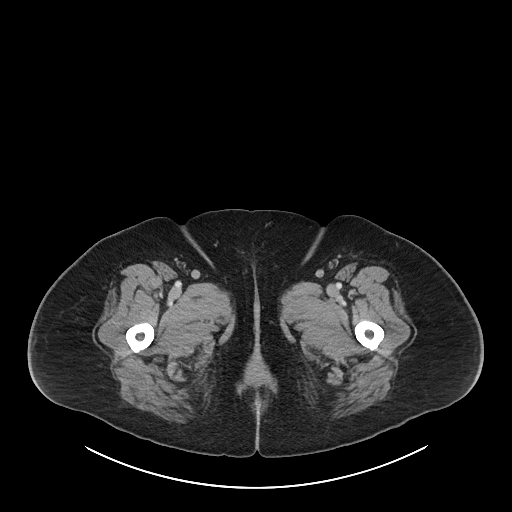
[im 23/101  soft-tissue]
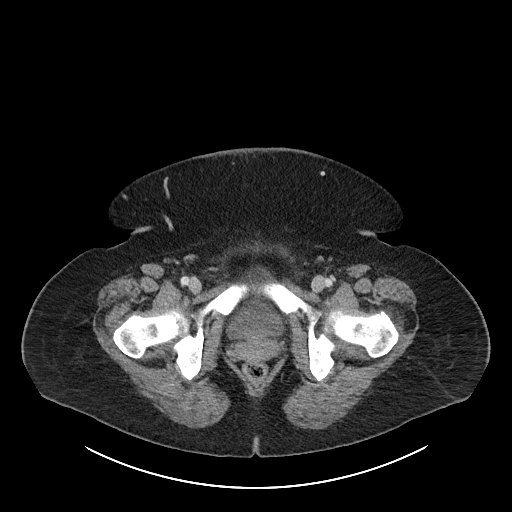
[im 28/101  soft-tissue]
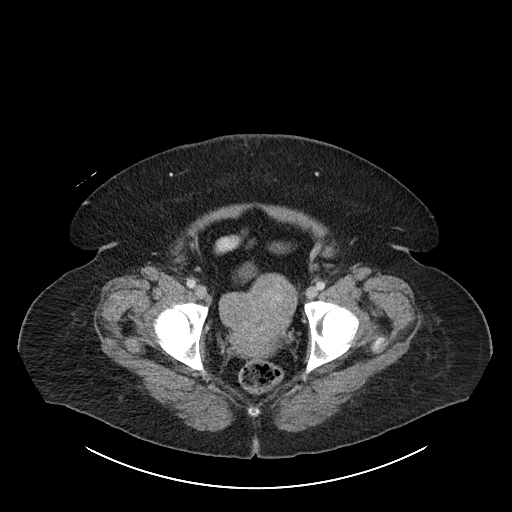
[im 34/101  soft-tissue]
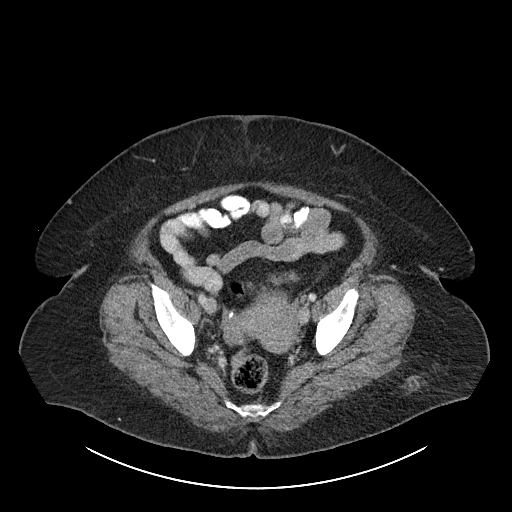
[im 45/101  soft-tissue]
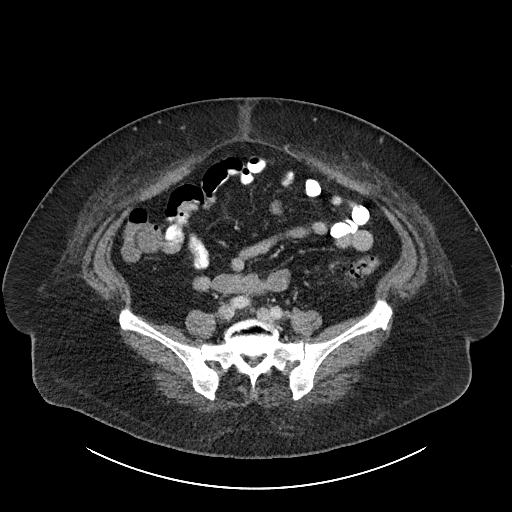
[im 51/101  soft-tissue]
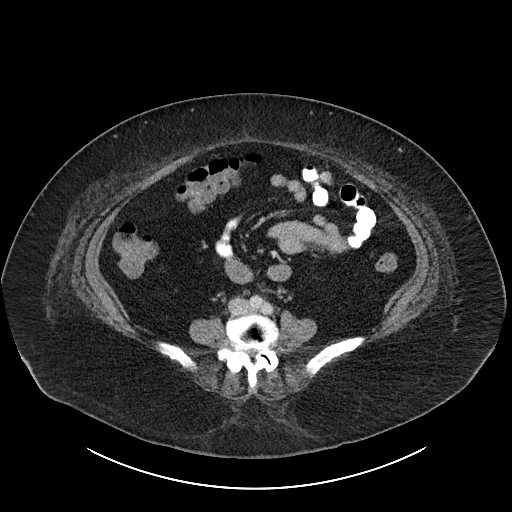
[im 56/101  soft-tissue]
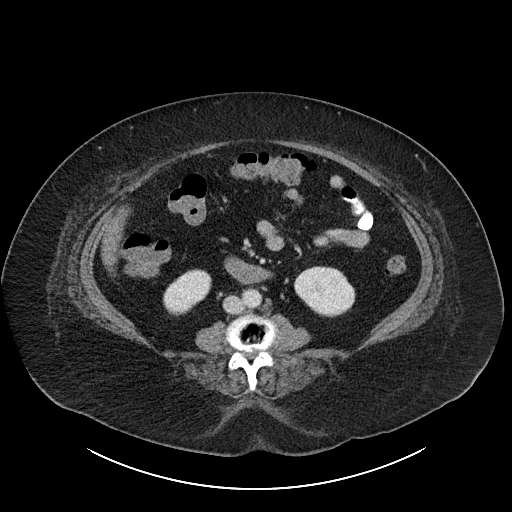
[im 67/101  soft-tissue]
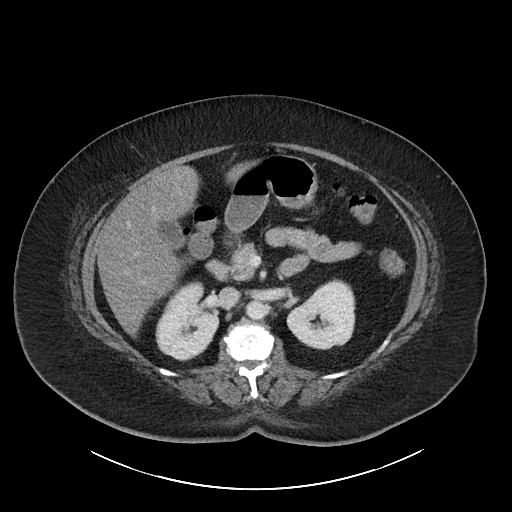
[im 67/101  bone]
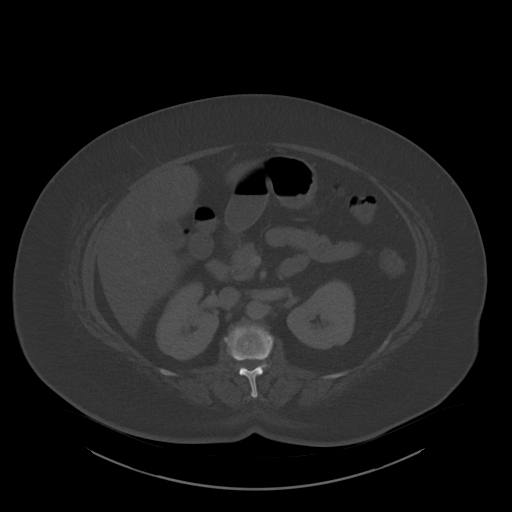
[im 73/101  soft-tissue]
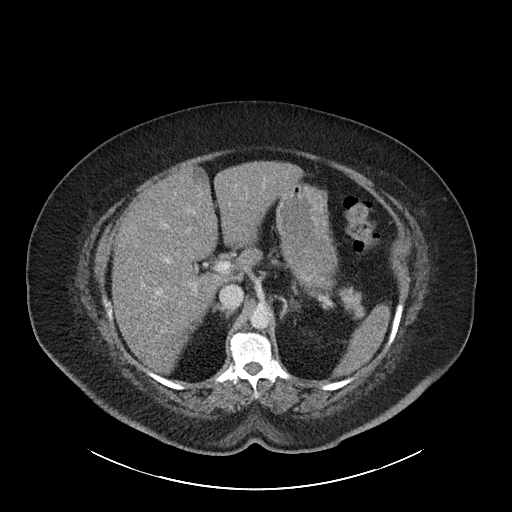
[im 78/101  soft-tissue]
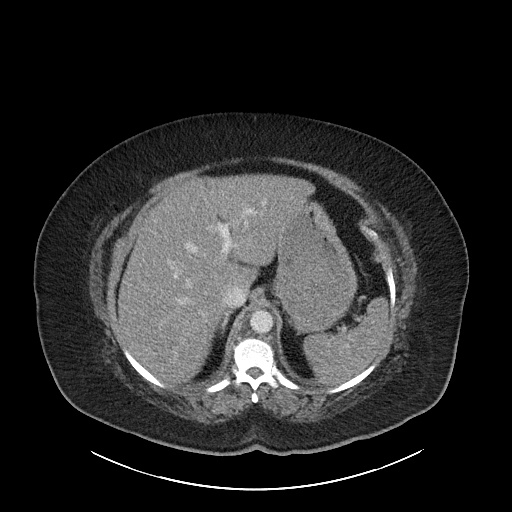
[im 89/101  soft-tissue]
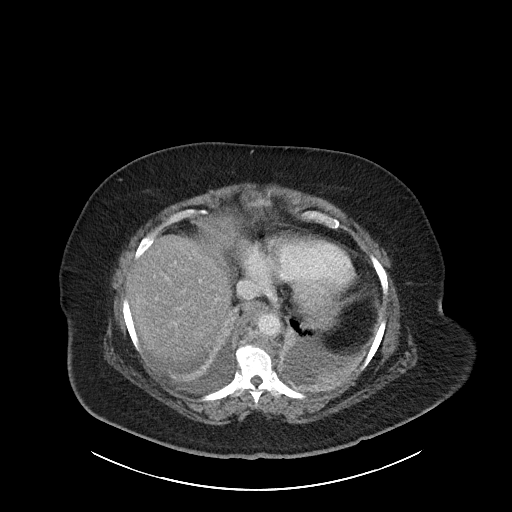
[im 95/101  soft-tissue]
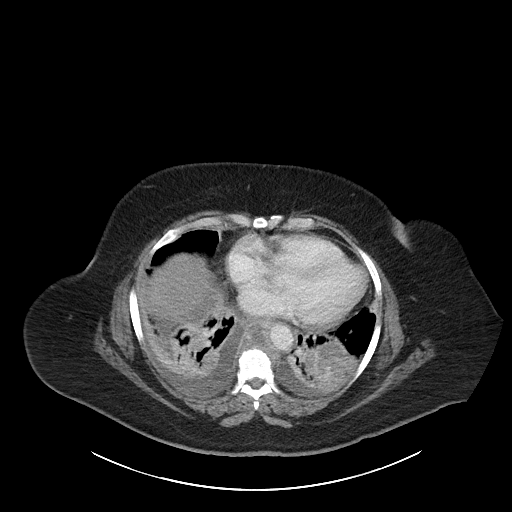

[Series 6: abdomen 3.0 mpr cor · coronal · 0.94mm/px · 3 of 123 slices shown]
[im 41/123  soft-tissue]
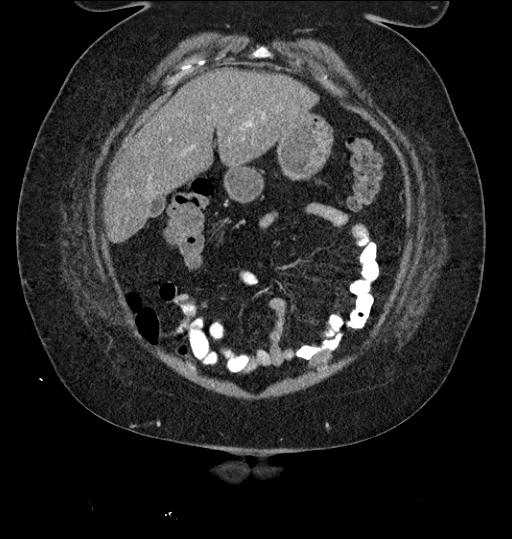
[im 55/123  soft-tissue]
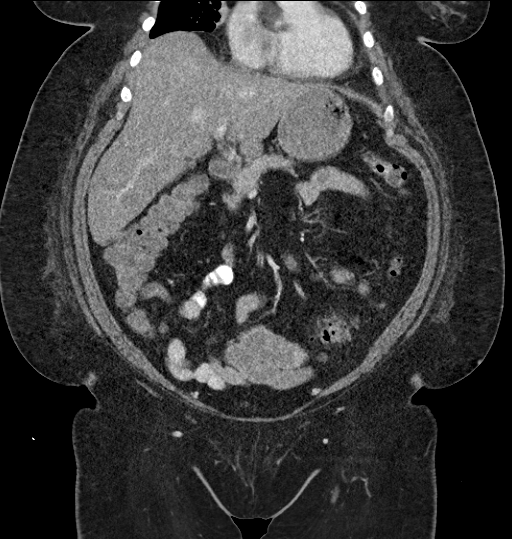
[im 68/123  soft-tissue]
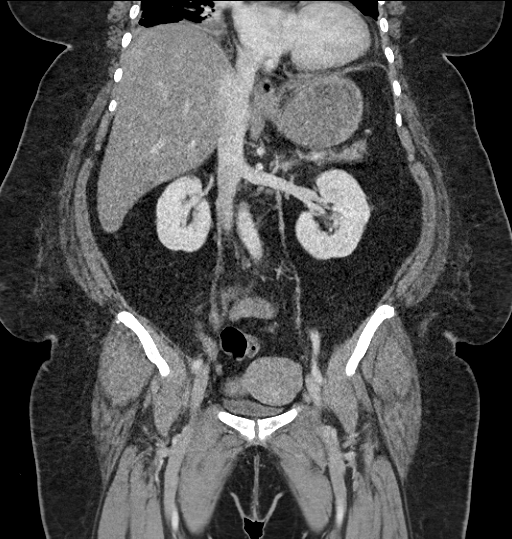

[16 of 46 positions shown; findings below may reference images not displayed]

FINDINGS: Lower chest: There are bilateral pleural effusions with associated
atelectasis or consolidation and a large, masslike consolidation of
the dependent left lung base measuring at least 4.9 cm, which
appears to be enlarged compared to prior CT of the chest dated
06/09/2018 (series 3, image 8). There is paravertebral fluid seen
about the partially included anterior lower thoracic spine and
diaphragmatic Craft (series 3, image 9).

Hepatobiliary: No focal liver abnormality is seen. Hepatic
steatosis. No gallstones, gallbladder wall thickening, or biliary
dilatation.

Pancreas: Unremarkable. No pancreatic ductal dilatation or
surrounding inflammatory changes.

Spleen: Normal in size without focal abnormality.

Adrenals/Urinary Tract: Adrenal glands are unremarkable. Kidneys are
normal, without renal calculi, focal lesion, or hydronephrosis.
Bladder is unremarkable.

Stomach/Bowel: Stomach is within normal limits. Appendix appears
normal. Sigmoid diverticulosis. There is mild persistent fat
stranding adjacent to the proximal sigmoid colon (series 3, image
60).

Vascular/Lymphatic: No significant vascular findings are present. No
enlarged abdominal or pelvic lymph nodes.

Reproductive: Uterine fibroids.

Other: No abdominal wall hernia or abnormality. No abdominopelvic
ascites.

Musculoskeletal: No acute or significant osseous findings.
IMPRESSION: 1. Sigmoid diverticulosis. There is mild persistent fat stranding
adjacent to the proximal sigmoid colon (series 3, image 60). This
may reflect persistent diverticulitis/colitis or alternately chronic
sequelae. No evidence of worsening colitis or developing
complication such as abscess or perforation.

2. No other CT findings of the abdomen or pelvis to explain
abdominal distension, nausea, vomiting, or pain. No evidence of
bowel obstruction.

3. There are bilateral pleural effusions with associated atelectasis
or consolidation and a large, masslike consolidation of the
dependent left lung base measuring at least 4.9 cm, which appears to
be enlarged compared to prior CT of the chest dated 06/09/2018
(series 3, image 8).

4. There is paravertebral fluid seen about the partially included
anterior lower thoracic spine and diaphragmatic Craft (series 3,
image 9). Consider dedicated chest imaging to further evaluate
interval change in these findings.

## 2021-01-08 ENCOUNTER — Other Ambulatory Visit: Payer: Self-pay | Admitting: Family Medicine

## 2021-01-23 ENCOUNTER — Other Ambulatory Visit: Payer: Self-pay

## 2021-01-23 ENCOUNTER — Ambulatory Visit (INDEPENDENT_AMBULATORY_CARE_PROVIDER_SITE_OTHER): Payer: Medicare HMO

## 2021-01-23 VITALS — BP 108/68 | HR 78 | Resp 16 | Ht <= 58 in | Wt 195.0 lb

## 2021-01-23 DIAGNOSIS — N959 Unspecified menopausal and perimenopausal disorder: Secondary | ICD-10-CM | POA: Diagnosis not present

## 2021-01-23 DIAGNOSIS — Z1231 Encounter for screening mammogram for malignant neoplasm of breast: Secondary | ICD-10-CM | POA: Diagnosis not present

## 2021-01-23 DIAGNOSIS — Z0000 Encounter for general adult medical examination without abnormal findings: Secondary | ICD-10-CM

## 2021-01-23 DIAGNOSIS — Z9189 Other specified personal risk factors, not elsewhere classified: Secondary | ICD-10-CM

## 2021-01-23 NOTE — Progress Notes (Signed)
Subjective:   Julia Johns is a 71 y.o. female who presents for Medicare Annual (Subsequent) preventive examination.  This wellness visit is conducted by a nurse.  The patient's medications were reviewed and reconciled since the patient's last visit.  History details were provided by the patient and her son, Julia Johns.  The history appears to be reliable.    Medical History: Patient history and Family history was reviewed  Medications, Allergies, and preventative health maintenance was reviewed and updated.   Review of Systems    Review of Systems  Constitutional: Negative.   HENT:  Positive for dental problem.        Has upper and lower dentures, they do not fit properly making it difficult to eat  Eyes: Negative.   Respiratory: Negative.  Negative for cough and shortness of breath.   Cardiovascular: Negative.  Negative for chest pain and palpitations.  Musculoskeletal:  Positive for arthralgias.  Neurological: Negative.   Psychiatric/Behavioral: Negative.     Cardiac Risk Factors include: advanced age (>84men, >72 women);diabetes mellitus;obesity (BMI >30kg/m2)     Objective:    Today's Vitals   01/23/21 0842  BP: 108/68  Pulse: 78  Resp: 16  SpO2: 93%  Weight: 195 lb (88.5 kg)  Height: 4\' 9"  (1.448 m)  PainSc: 0-No pain   Body mass index is 42.2 kg/m.  Advanced Directives 01/23/2021 01/19/2020 12/27/2018 08/05/2018 07/21/2018 07/10/2018 07/03/2018  Does Patient Have a Medical Advance Directive? No No No No No No No  Would patient like information on creating a medical advance directive? Yes (MAU/Ambulatory/Procedural Areas - Information given) - No - Patient declined - No - Patient declined - No - Patient declined    Current Medications (verified) Outpatient Encounter Medications as of 01/23/2021  Medication Sig   ACCU-CHEK AVIVA PLUS test strip    Accu-Chek Softclix Lancets lancets USE TO CHECK BLOOD SUGAR TWICE A DAY E11.69   chlorthalidone (HYGROTON) 25 MG  tablet Take 0.5 tablets (12.5 mg total) by mouth daily.   glipiZIDE (GLUCOTROL) 5 MG tablet TAKE 1/2 (ONE-HALF) TABLET BY MOUTH BEFORE BREAKFAST AND 1 TABLET IN THE EVENING BEFORE  SUPPER   metFORMIN (GLUCOPHAGE) 500 MG tablet TAKE 1 TABLET BY MOUTH ONCE DAILY AFTER  THE  BIGGEST  MEAL   metroNIDAZOLE (METROGEL) 1 % gel Apply topically daily.   rosuvastatin (CRESTOR) 10 MG tablet Take 1 tablet (10 mg total) by mouth daily.   [DISCONTINUED] metFORMIN (GLUCOPHAGE) 500 MG tablet Take 1 tablet (500 mg total) by mouth daily. After the biggest meal.   No facility-administered encounter medications on file as of 01/23/2021.    Allergies (verified) Penicillins and Tetracycline   History: Past Medical History:  Diagnosis Date   Chronic kidney disease    Chronic respiratory failure with hypoxia (HCC)    10/09/2018-overnight oximetry- duration of sleep 7 hours and 34 minutes, on room air, SPO2 less than 88% 1 hour and 34 minutes and 28 seconds.    Diabetes mellitus without complication ([place])    Grade II hemorrhoids 07/03/2017   Last Assessment & Plan:  Formatting of this note might be different from the original. Fiber supplement stool softener adequate hydration and local hemorrhoid management is advisable.   Vasculitis ([place])    Vasculitis, ANCA positive    Past Surgical History:  Procedure Laterality Date   VIDEO BRONCHOSCOPY Bilateral 06/18/2018   Procedure: VIDEO BRONCHOSCOPY WITH FLUORO;  Surgeon: Julia Noel, MD;  Location: [facility];  Service: Cardiopulmonary;  Laterality: Bilateral;  VIDEO BRONCHOSCOPY WITH ENDOBRONCHIAL ULTRASOUND N/A 06/25/2018   Procedure: VIDEO BRONCHOSCOPY WITH ENDOBRONCHIAL ULTRASOUND;  Surgeon: Julia Noel, MD;  Location: [facility];  Service: Thoracic;  Laterality: N/A;   Family History  Problem Relation Age of Onset   Uterine cancer Mother    Social History   Socioeconomic History   Marital status: Single  Tobacco Use   Smoking status: Never   Smokeless  tobacco: Never  Substance and Sexual Activity   Alcohol use: Never   Drug use: Never   Sexual activity: Not Currently  Other Topics Concern   Not on file  Social History Narrative   Patient primary language is spanish, she lives at home with her son Julia Johns and daughter in law Julia Johns    Social Determinants of Health   Financial Resource Strain: Not on file  Food Insecurity: Not on file  Transportation Needs: Not on file  Physical Activity: Not on file  Stress: Not on file  Social Connections: Not on file    Tobacco Counseling Counseling given: Patient does not use tobacco products   Clinical Intake:  Pre-visit preparation completed: Yes Pain : No/denies pain Pain Score: 0-No pain   BMI - recorded: 42.2 Nutritional Status: BMI > 30  Obese Nutritional Risks: None Diabetes: Yes (Last A1C 7.2) CBG done?: No Did pt. bring in CBG monitor from home?: No How often do you need to have someone help you when you read instructions, pamphlets, or other written materials from your doctor or pharmacy?: 2 - Rarely     Activities of Daily Living In your present state of health, do you have any difficulty performing the following activities: 01/23/2021 04/18/2020  Hearing? N N  Vision? N N  Difficulty concentrating or making decisions? N N  Walking or climbing stairs? N N  Dressing or bathing? N N  Doing errands, shopping? N Y  Conservation officer, nature and eating ? N -  Using the Toilet? N -  In the past six months, have you accidently leaked urine? N -  Do you have problems with loss of bowel control? N -  Managing your Medications? N -  Managing your Finances? N -  Housekeeping or managing your Housekeeping? N -  Some recent data might be hidden    Patient Care Team: Julia Brome, MD as PCP - General (Family Medicine)     Assessment:   This is a routine wellness examination for Julia Johns.  Hearing/Vision screen No results found.  Dietary issues and exercise activities  discussed: Current Exercise Habits: Home exercise routine, Type of exercise: walking;strength training/weights, Time (Minutes): 20, Frequency (Times/Week): 3, Weekly Exercise (Minutes/Week): 60, Intensity: Mild, Exercise limited by: None identified   Goals Addressed             This Visit's Progress    DIET - REDUCE SUGAR INTAKE       Reduce unhealthy snacking - pay attention to sugar and carbs in the foods you are eating     Exercise 3x per week (30 min per time)         Depression Screen PHQ 2/9 Scores 01/23/2021 04/18/2020 01/19/2020 01/17/2020 10/15/2019 07/10/2018  PHQ - 2 Score 0 0 1 0 0 0    Fall Risk Fall Risk  01/23/2021 04/18/2020 01/19/2020 01/17/2020 11/11/2018  Falls in the past year? 0 0 0 0 0  Number falls in past yr: 0 0 0 0 0  Injury with Fall? 0 0 0 0 0  Risk for  fall due to : No Fall Risks No Fall Risks - - Medication side effect;Impaired balance/gait  Follow up Falls evaluation completed;Falls prevention discussed Falls evaluation completed - - Falls evaluation completed    FALL RISK PREVENTION PERTAINING TO THE HOME:  Any stairs in or around the home? Yes  If so, are there any without handrails? No  Home free of loose throw rugs in walkways, pet beds, electrical cords, etc? Yes  Adequate lighting in your home to reduce risk of falls? Yes   ASSISTIVE DEVICES UTILIZED TO PREVENT FALLS:  Life alert? No  Use of a cane, walker or w/c? No  Grab bars in the bathrGait slow and steady without use of assistive device  Cognitive Function: MMSE - Mini Mental State Exam 01/19/2020  Orientation to time 4  Orientation to Place 5  Registration 3  Attention/ Calculation 4  Recall 3  Language- name 2 objects 2  Language- repeat 0  Language- follow 3 step command 2  Language- read & follow direction 1  Write a sentence 1  Copy design 1  Total score 26     6CIT Screen 01/23/2021 01/19/2020  What Year? 0 points 0 points  What month? 0 points 0 points  What time? 0  points 0 points  Count back from 20 2 points 4 points  Months in reverse 4 points 4 points  Repeat phrase 0 points 0 points  Total Score 6 8    Immunizations Immunization History  Administered Date(s) Administered   Fluad Quad(high Dose 65+) 10/22/2019, 11/24/2020   Influenza-Unspecified 10/23/2017   PFIZER Comirnaty(Gray Top)Covid-19 Tri-Sucrose Vaccine 04/18/2020   PFIZER(Purple Top)SARS-COV-2 Vaccination 04/15/2019, 05/10/2019, 11/18/2019   Pfizer Covid-19 Vaccine Bivalent Booster 73yrs & up 11/24/2020   Pneumococcal Conjugate-13 04/18/2020    TDAP status: Due, Education has been provided regarding the importance of this vaccine. Advised may receive this vaccine at local pharmacy or Health Dept. Aware to provide a copy of the vaccination record if obtained from local pharmacy or Health Dept. Verbalized acceptance and understanding.  Flu Vaccine status: Up to date  Pneumococcal vaccine status: Due, Education has been provided regarding the importance of this vaccine. Advised may receive this vaccine at local pharmacy or Health Dept. Aware to provide a copy of the vaccination record if obtained from local pharmacy or Health Dept. Verbalized acceptance and understanding.  Covid-19 vaccine status: Completed vaccines  Qualifies for Shingles Vaccine? Yes   Zostavax completed No   Shingrix Completed?: No.    Education has been provided regarding the importance of this vaccine. Patient has been advised to call insurance company to determine out of pocket expense if they have not yet received this vaccine. Advised may also receive vaccine at local pharmacy or Health Dept. Verbalized acceptance and understanding.  Screening Tests Health Maintenance  Topic Date Due   TETANUS/TDAP  Never done   Zoster Vaccines- Shingrix (1 of 2) Never done   MAMMOGRAM  Never done   DEXA SCAN  Never done   Pneumonia Vaccine 27+ Years old (2 - PPSV23 if available, else PCV20) 04/18/2021   FOOT EXAM   04/18/2021   URINE MICROALBUMIN  04/18/2021   OPHTHALMOLOGY EXAM  04/25/2021   HEMOGLOBIN A1C  05/24/2021   COLONOSCOPY (Pts 45-24yrs Insurance coverage will need to be confirmed)  07/05/2027   INFLUENZA VACCINE  Completed   COVID-19 Vaccine  Completed   Hepatitis C Screening  Completed   HPV VACCINES  Aged Out    Health Maintenance  Health Maintenance Due  Topic Date Due   TETANUS/TDAP  Never done   Zoster Vaccines- Shingrix (1 of 2) Never done   MAMMOGRAM  Never done   DEXA SCAN  Never done   Pneumonia Vaccine 4+ Years old (2 - PPSV23 if available, else PCV20) 04/18/2021    Colorectal cancer screening: Type of screening: Colonoscopy. Completed 07/04/2017. Repeat every 10 years  Mammogram status: Scheduled on the Mobile Mammo Bus 02/21/21 at 9 am  Bone Density status: Ordered  Lung Cancer Screening: (Low Dose CT Chest recommended if Age 40-80 years, 30 pack-year currently smoking OR have quit w/in 15years.) does not qualify.   Additional Screening:  Vision Screening: Recommended annual ophthalmology exams for early detection of glaucoma and other disorders of the eye. Is the patient up to date with their annual eye exam?  Yes   Dental Screening: Recommended annual dental exams for proper oral hygiene     Plan:    1- Mammogram scheduled on the mobile mammogram bus for Feb 21, 2021 at 9 am 2- DEXA ordered 3- Healthy diet and exercise recommended 4- I will help find a dentist to get help with proper denture fitting 5- Information given about advance directives.  Discussed with patient and her son the benefit of having this document in place.  They plan to speak to a lawyer to have it documented and will bring in a copy for our records once complete.  I have personally reviewed and noted the following in the patients chart:   Medical and social history Use of alcohol, tobacco or illicit drugs  Current medications and supplements including opioid prescriptions.   Functional ability and status Nutritional status Physical activity Advanced directives List of other physicians Hospitalizations, surgeries, and ER visits in previous 12 months Vitals Screenings to include cognitive, depression, and falls Referrals and appointments  In addition, I have reviewed and discussed with patient certain preventive protocols, quality metrics, and best practice recommendations. A written personalized care plan for preventive services as well as general preventive health recommendations were provided to patient.     Erie Noe, LPN   08/27/[id number]

## 2021-01-25 ENCOUNTER — Telehealth: Payer: Self-pay | Admitting: Family Medicine

## 2021-01-25 NOTE — Telephone Encounter (Signed)
° °  Julia Johns has been scheduled for the following appointment:  WHAT: BONE DENSITY WHERE: [facility] DATE: 02/08/21 TIME: 9:30 AM ARRIVAL TIME  Patient has been made aware. SPOKE TO PR'S SON

## 2021-02-08 DIAGNOSIS — M85851 Other specified disorders of bone density and structure, right thigh: Secondary | ICD-10-CM | POA: Diagnosis not present

## 2021-02-08 DIAGNOSIS — N959 Unspecified menopausal and perimenopausal disorder: Secondary | ICD-10-CM | POA: Diagnosis not present

## 2021-02-13 ENCOUNTER — Other Ambulatory Visit: Payer: Self-pay

## 2021-02-13 DIAGNOSIS — N959 Unspecified menopausal and perimenopausal disorder: Secondary | ICD-10-CM

## 2021-02-21 ENCOUNTER — Other Ambulatory Visit: Payer: Self-pay

## 2021-02-21 ENCOUNTER — Ambulatory Visit
Admission: RE | Admit: 2021-02-21 | Discharge: 2021-02-21 | Disposition: A | Discharge status: Home / Self Care | Payer: Medicare HMO | Source: Ambulatory Visit | Attending: Family Medicine | Admitting: Family Medicine

## 2021-02-21 DIAGNOSIS — Z1231 Encounter for screening mammogram for malignant neoplasm of breast: Secondary | ICD-10-CM

## 2021-02-27 ENCOUNTER — Other Ambulatory Visit: Payer: Self-pay | Admitting: Family Medicine

## 2021-03-01 ENCOUNTER — Other Ambulatory Visit: Payer: Self-pay

## 2021-03-01 ENCOUNTER — Other Ambulatory Visit: Payer: Self-pay | Admitting: Family Medicine

## 2021-03-01 ENCOUNTER — Other Ambulatory Visit: Payer: Self-pay | Admitting: Radiology

## 2021-03-01 DIAGNOSIS — R928 Other abnormal and inconclusive findings on diagnostic imaging of breast: Secondary | ICD-10-CM

## 2021-03-06 DIAGNOSIS — N179 Acute kidney failure, unspecified: Secondary | ICD-10-CM | POA: Diagnosis not present

## 2021-03-06 DIAGNOSIS — E559 Vitamin D deficiency, unspecified: Secondary | ICD-10-CM | POA: Diagnosis not present

## 2021-03-14 DIAGNOSIS — E559 Vitamin D deficiency, unspecified: Secondary | ICD-10-CM | POA: Diagnosis not present

## 2021-03-14 DIAGNOSIS — D631 Anemia in chronic kidney disease: Secondary | ICD-10-CM | POA: Diagnosis not present

## 2021-03-14 DIAGNOSIS — I10 Essential (primary) hypertension: Secondary | ICD-10-CM | POA: Diagnosis not present

## 2021-03-14 DIAGNOSIS — I7782 Antineutrophilic cytoplasmic antibody (ANCA) vasculitis: Secondary | ICD-10-CM | POA: Diagnosis not present

## 2021-03-14 DIAGNOSIS — N189 Chronic kidney disease, unspecified: Secondary | ICD-10-CM | POA: Diagnosis not present

## 2021-03-27 ENCOUNTER — Telehealth: Payer: Self-pay | Admitting: Family Medicine

## 2021-03-27 NOTE — Telephone Encounter (Signed)
? ?  Julia Johns has been scheduled for the following appointment: ? ?WHAT: DIAGNOSTIC MAMMOGRAM ?WHERE: [facility] ?DATE: 04/11/21 ?TIME: 9:10 AM ARRIVAL TIME ? ?Patient has been made aware. ?SPOKE TO PT'S SON ?

## 2021-04-16 ENCOUNTER — Other Ambulatory Visit: Payer: Self-pay

## 2021-04-16 DIAGNOSIS — E1169 Type 2 diabetes mellitus with other specified complication: Secondary | ICD-10-CM

## 2021-04-16 MED ORDER — GLIPIZIDE 5 MG PO TABS: ORAL_TABLET | ORAL | 0 refills | Status: DC

## 2021-04-16 MED ORDER — ROSUVASTATIN CALCIUM 10 MG PO TABS: 10.0000 mg | ORAL_TABLET | Freq: Every day | ORAL | 0 refills | Status: DC

## 2021-04-16 MED ORDER — METFORMIN HCL 500 MG PO TABS: ORAL_TABLET | ORAL | 0 refills | Status: DC

## 2021-04-16 MED ORDER — CHLORTHALIDONE 25 MG PO TABS: 12.5000 mg | ORAL_TABLET | Freq: Every day | ORAL | 0 refills | Status: DC

## 2021-04-20 ENCOUNTER — Other Ambulatory Visit: Payer: Medicare HMO

## 2021-04-20 DIAGNOSIS — E782 Mixed hyperlipidemia: Secondary | ICD-10-CM

## 2021-04-20 DIAGNOSIS — I10 Essential (primary) hypertension: Secondary | ICD-10-CM | POA: Diagnosis not present

## 2021-04-20 DIAGNOSIS — E1165 Type 2 diabetes mellitus with hyperglycemia: Secondary | ICD-10-CM | POA: Diagnosis not present

## 2021-04-21 LAB — COMPREHENSIVE METABOLIC PANEL
ALT: 43 IU/L — ABNORMAL HIGH (ref 0–32)
AST: 22 IU/L (ref 0–40)
Albumin/Globulin Ratio: 1.8 (ref 1.2–2.2)
Albumin: 4.1 g/dL (ref 3.8–4.8)
Alkaline Phosphatase: 121 IU/L (ref 44–121)
BUN/Creatinine Ratio: 28 (ref 12–28)
BUN: 17 mg/dL (ref 8–27)
Bilirubin Total: 0.3 mg/dL (ref 0.0–1.2)
CO2: 23 mmol/L (ref 20–29)
Calcium: 10 mg/dL (ref 8.7–10.3)
Chloride: 103 mmol/L (ref 96–106)
Creatinine, Ser: 0.6 mg/dL (ref 0.57–1.00)
Globulin, Total: 2.3 g/dL (ref 1.5–4.5)
Glucose: 137 mg/dL — ABNORMAL HIGH (ref 70–99)
Potassium: 4.1 mmol/L (ref 3.5–5.2)
Sodium: 140 mmol/L (ref 134–144)
Total Protein: 6.4 g/dL (ref 6.0–8.5)
eGFR: 96 mL/min/{1.73_m2} (ref >59)

## 2021-04-21 LAB — CBC WITH DIFF/PLATELET
Basophils Absolute: 0.1 10*3/uL (ref 0.0–0.2)
Basos: 1 %
EOS (ABSOLUTE): 0.4 10*3/uL (ref 0.0–0.4)
Eos: 3 %
Hematocrit: 44.2 % (ref 34.0–46.6)
Hemoglobin: 14.4 g/dL (ref 11.1–15.9)
Immature Grans (Abs): 0 10*3/uL (ref 0.0–0.1)
Immature Granulocytes: 0 %
Lymphocytes Absolute: 6 10*3/uL — ABNORMAL HIGH (ref 0.7–3.1)
Lymphs: 41 %
MCH: 28.9 pg (ref 26.6–33.0)
MCHC: 32.6 g/dL (ref 31.5–35.7)
MCV: 89 fL (ref 79–97)
Monocytes Absolute: 0.8 10*3/uL (ref 0.1–0.9)
Monocytes: 6 %
Neutrophils Absolute: 7.2 10*3/uL — ABNORMAL HIGH (ref 1.4–7.0)
Neutrophils: 49 %
Platelets: 406 10*3/uL (ref 150–450)
RBC: 4.99 x10E6/uL (ref 3.77–5.28)
RDW: 13.5 % (ref 11.7–15.4)
WBC: 14.5 10*3/uL — ABNORMAL HIGH (ref 3.4–10.8)

## 2021-04-21 LAB — LIPID PANEL
Chol/HDL Ratio: 3.1 ratio (ref 0.0–4.4)
Cholesterol, Total: 195 mg/dL (ref 100–199)
HDL: 62 mg/dL (ref >39)
LDL Chol Calc (NIH): 96 mg/dL (ref 0–99)
Triglycerides: 222 mg/dL — ABNORMAL HIGH (ref 0–149)
VLDL Cholesterol Cal: 37 mg/dL (ref 5–40)

## 2021-04-21 LAB — HEMOGLOBIN A1C
Est. average glucose Bld gHb Est-mCnc: 214 mg/dL
Hgb A1c MFr Bld: 9.1 % — ABNORMAL HIGH (ref 4.8–5.6)

## 2021-04-21 LAB — CARDIOVASCULAR RISK ASSESSMENT

## 2021-04-22 NOTE — Progress Notes (Signed)
? ?Subjective:  ?Patient ID: Julia Johns, female    DOB: 12-Oct-1950  Age: 71 y.o. MRN: [id number] ? ?Chief Complaint  ?Patient presents with  ? Diabetes  ? Hypertension  ? Hyperlipidemia  ? ? ?Diabetes:  ?Complications: hyperlipidemia. hypertension ?Glucose checking: not checking regularly ?Hypoglycemia: No ?Most recent A1C: 9.1 ?Current medications: Metformin 500 mg take 1 tablet daily, Glipizide 5 mg take 1/2 tablet in the AM and 1 tablet in the PM. ?Last Eye Exam: Had eye surgery for cataracts recenlty. ?Foot checks: daily ? ?Hyperlipidemia: ?Current medications: Patient is currently taking Rosuvastatin 10 mg take 1 tablet daily. ? ?Hypertension: ?Current medications: Patient is currently taking Chlorthalidone 25 mg take 0.5 tablet by mouth daily. ? ?Diet: poor ?Exercise: no formal. ?  ?Current Outpatient Medications on File Prior to Visit  ?Medication Sig Dispense Refill  ? ACCU-CHEK AVIVA PLUS test strip     ? Accu-Chek Softclix Lancets lancets USE TO CHECK BLOOD SUGAR TWICE A DAY E11.69 100 each 2  ? chlorthalidone (HYGROTON) 25 MG tablet Take 0.5 tablets (12.5 mg total) by mouth daily. 15 tablet 0  ? glipiZIDE (GLUCOTROL) 5 MG tablet TAKE 1/2 (ONE-HALF) TABLET BY MOUTH BEFORE BREAKFAST AND 1 TABLET IN THE EVENING BEFORE  SUPPER 60 tablet 0  ? metFORMIN (GLUCOPHAGE) 500 MG tablet TAKE 1 TABLET BY MOUTH ONCE DAILY AFTER  THE  BIGGEST  MEAL 30 tablet 0  ? metroNIDAZOLE (METROGEL) 1 % gel Apply topically daily. 45 g 0  ? rosuvastatin (CRESTOR) 10 MG tablet Take 1 tablet (10 mg total) by mouth daily. 30 tablet 0  ? ?No current facility-administered medications on file prior to visit.  ? ?Past Medical History:  ?Diagnosis Date  ? Chronic kidney disease   ? Chronic respiratory failure with hypoxia (HCC)   ? 10/09/2018-overnight oximetry- duration of sleep 7 hours and 34 minutes, on room air, SPO2 less than 88% 1 hour and 34 minutes and 28 seconds.   ? Diabetes mellitus without complication ([place])   ? Grade  II hemorrhoids 07/03/2017  ? Last Assessment & Plan:  Formatting of this note might be different from the original. Fiber supplement stool softener adequate hydration and local hemorrhoid management is advisable.  ? Vasculitis ([place])   ? 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 ?Past Surgical History:  ?Procedure Laterality Date  ? VIDEO BRONCHOSCOPY Bilateral 06/18/2018  ? Procedure: VIDEO BRONCHOSCOPY WITH FLUORO;  Surgeon: Rigoberto Noel, MD;  Location: [place];  Service: Cardiopulmonary;  Laterality: Bilateral;  ? VIDEO BRONCHOSCOPY WITH ENDOBRONCHIAL ULTRASOUND N/A 06/25/2018  ? Procedure: VIDEO BRONCHOSCOPY WITH ENDOBRONCHIAL ULTRASOUND;  Surgeon: Rigoberto Noel, MD;  Location: [facility];  Service: Thoracic;  Laterality: N/A;  ?  ?Family History  ?Problem Relation Age of Onset  ? Uterine cancer Mother   ? Breast cancer Neg Hx   ? ?Social History  ? ?Socioeconomic History  ? Marital status: Single  ?  Spouse name: Not on file  ? Number of children: Not on file  ? Years of education: Not on file  ? Highest education level: Not on file  ?Occupational History  ? Not on file  ?Tobacco Use  ? Smoking status: Never  ? Smokeless tobacco: Never  ?Substance and Sexual Activity  ? Alcohol use: Never  ? Drug use: Never  ? Sexual activity: Not Currently  ?Other Topics Concern  ? Not on file  ?Social History Narrative  ? Patient primary language is spanish, she lives at home with her  son Julia Johns and daughter in law Julia Johns   ? ?Social Determinants of Health  ? ?Financial Resource Strain: Not on file  ?Food Insecurity: Not on file  ?Transportation Needs: Not on file  ?Physical Activity: Not on file  ?Stress: Not on file  ?Social Connections: Not on file  ? ? ?Review of Systems  ?Constitutional:  Negative for appetite change, fatigue and fever.  ?HENT:  Negative for congestion, ear pain, sinus pressure and sore throat.   ?Respiratory:  Negative for cough, chest tightness, shortness of breath and wheezing.    ?Cardiovascular:  Negative for chest pain and palpitations.  ?Gastrointestinal:  Negative for abdominal pain, constipation, diarrhea, nausea and vomiting.  ?Genitourinary:  Negative for dysuria and hematuria.  ?Musculoskeletal:  Negative for arthralgias, back pain, joint swelling and myalgias.  ?Skin:  Negative for rash.  ?Neurological:  Negative for dizziness, weakness and headaches.  ?Psychiatric/Behavioral:  Negative for dysphoric mood. The patient is not nervous/anxious.   ? ? ?Objective:  ?BP 110/72 (BP Location: Left Arm, Patient Position: Sitting)   Pulse 61   Temp 98.2 ?F (36.8 ?C) (Oral)   Ht '4\' 9"'$  (1.448 m)   Wt 194 lb (88 kg)   SpO2 96%   BMI 41.98 kg/m?  ? ? ?  04/23/2021  ?  1:39 PM 01/23/2021  ?  8:42 AM 11/26/2020  ?  9:19 AM  ?BP/Weight  ?Systolic BP 295 621 308  ?Diastolic BP 72 68 60  ?Wt. (Lbs) 194 195 192  ?BMI 41.98 kg/m2 42.2 kg/m2 41.55 kg/m2  ? ? ?Physical Exam ?Vitals reviewed.  ?Constitutional:   ?   Appearance: Normal appearance. She is obese.  ?Neck:  ?   Vascular: No carotid bruit.  ?Cardiovascular:  ?   Rate and Rhythm: Normal rate and regular rhythm.  ?   Heart sounds: Normal heart sounds.  ?Pulmonary:  ?   Effort: Pulmonary effort is normal. No respiratory distress.  ?   Breath sounds: Normal breath sounds.  ?Abdominal:  ?   General: Abdomen is flat. Bowel sounds are normal.  ?   Palpations: Abdomen is soft.  ?   Tenderness: There is no abdominal tenderness.  ?Neurological:  ?   Mental Status: She is alert and oriented to person, place, and time.  ?Psychiatric:     ?   Mood and Affect: Mood normal.     ?   Behavior: Behavior normal.  ? ? ?Diabetic Foot Exam - Simple   ?Simple Foot Form ? 04/23/2021  8:01 PM  ?Visual Inspection ?No deformities, no ulcerations, no other skin breakdown bilaterally: Yes ?Sensation Testing ?Intact to touch and monofilament testing bilaterally: Yes ?Pulse Check ?Posterior Tibialis and Dorsalis pulse intact bilaterally: Yes ?Comments ?  ?  ? ?Lab Results   ?Component Value Date  ? WBC 13.4 (H) 05/04/2021  ? HGB 14.0 05/04/2021  ? HCT 42.9 05/04/2021  ? PLT 349 05/04/2021  ? GLUCOSE 137 (H) 04/20/2021  ? CHOL 195 04/20/2021  ? TRIG 222 (H) 04/20/2021  ? HDL 62 04/20/2021  ? [place] 96 04/20/2021  ? ALT 43 (H) 04/20/2021  ? AST 22 04/20/2021  ? NA 140 04/20/2021  ? K 4.1 04/20/2021  ? CL 103 04/20/2021  ? CREATININE 0.60 04/20/2021  ? BUN 17 04/20/2021  ? CO2 23 04/20/2021  ? INR 1.2 07/05/2018  ? HGBA1C 9.1 (H) 04/20/2021  ? MICROALBUR 30 04/18/2020  ? ? ? ? ?Assessment & Plan:  ? ?Problem List Items Addressed  This Visit   ? ?  ? Cardiovascular and Mediastinum  ? Benign hypertension  ?  Well controlled.  ?No changes to medicines.  ?Continue to work on eating a healthy diet and exercise.  ? ?  ?  ?  ? Endocrine  ? Dyslipidemia associated with type 2 diabetes mellitus ([place]) - Primary  ?  Recommend continue to work on eating healthy diet and exercise. ? ?  ?  ? Relevant Medications  ? Semaglutide (RYBELSUS) 3 MG TABS  ? Type 2 diabetes mellitus with hyperglycemia, without long-term current use of insulin ([place])  ?  Control: Fair ?Recommend check sugars fasting daily. ?Recommend check feet daily. ?Recommend annual eye exams. ?Medicines: Metformin 500 mg take 1 tablet daily, Glipizide 5 mg take 1/2 tablet in the AM and 1 tablet in the PM. Add Rybelsus 3 mg una pastilla cada dia en ayunas ?Continue to work on eating a healthy diet and exercise.  ?   ?  ?  ? Relevant Medications  ? Semaglutide (RYBELSUS) 3 MG TABS  ? Other Relevant Orders  ? Microalbumin / creatinine urine ratio (Completed)  ? Glucose (CBG) (Completed)  ?  ? Musculoskeletal and Integument  ? Primary osteoarthritis of both knees  ?  The current medical regimen is effective;  continue present plan and medications. ? ?  ?  ? Rosacea, acne  ?  The current medical regimen is effective;  continue present plan and medications. ? ?  ?  ?  ? Other  ? Mixed hyperlipidemia  ?  Recommend continue to work on eating  healthy diet and exercise.Continue with Rosuvastatin 10 mg take 1 tablet daily. ? ?  ?  ? Leucocytosis  ?  CBC was ordered. ? ?  ?  ? Relevant Orders  ? CBC with Differential/Platelet (Completed)  ?. ? ?Meds ordered this encounter

## 2021-04-23 ENCOUNTER — Other Ambulatory Visit: Payer: Self-pay

## 2021-04-23 ENCOUNTER — Encounter: Payer: Self-pay | Admitting: Family Medicine

## 2021-04-23 ENCOUNTER — Ambulatory Visit (INDEPENDENT_AMBULATORY_CARE_PROVIDER_SITE_OTHER): Payer: Medicare HMO | Admitting: Family Medicine

## 2021-04-23 VITALS — BP 110/72 | HR 61 | Temp 98.2°F | Ht <= 58 in | Wt 194.0 lb

## 2021-04-23 DIAGNOSIS — L719 Rosacea, unspecified: Secondary | ICD-10-CM

## 2021-04-23 DIAGNOSIS — I10 Essential (primary) hypertension: Secondary | ICD-10-CM | POA: Diagnosis not present

## 2021-04-23 DIAGNOSIS — E782 Mixed hyperlipidemia: Secondary | ICD-10-CM | POA: Diagnosis not present

## 2021-04-23 DIAGNOSIS — M170 Bilateral primary osteoarthritis of knee: Secondary | ICD-10-CM

## 2021-04-23 DIAGNOSIS — D72828 Other elevated white blood cell count: Secondary | ICD-10-CM

## 2021-04-23 DIAGNOSIS — E1165 Type 2 diabetes mellitus with hyperglycemia: Secondary | ICD-10-CM | POA: Diagnosis not present

## 2021-04-23 DIAGNOSIS — E1169 Type 2 diabetes mellitus with other specified complication: Secondary | ICD-10-CM

## 2021-04-23 DIAGNOSIS — E785 Hyperlipidemia, unspecified: Secondary | ICD-10-CM

## 2021-04-23 LAB — CBC WITH DIFFERENTIAL/PLATELET
Basophils Absolute: 0.1 10*3/uL (ref 0.0–0.2)
Basos: 1 %
EOS (ABSOLUTE): 0.1 10*3/uL (ref 0.0–0.4)
Eos: 1 %
Hematocrit: 44.2 % (ref 34.0–46.6)
Hemoglobin: 14.4 g/dL (ref 11.1–15.9)
Immature Grans (Abs): 0 10*3/uL (ref 0.0–0.1)
Immature Granulocytes: 0 %
Lymphocytes Absolute: 3.7 10*3/uL — ABNORMAL HIGH (ref 0.7–3.1)
Lymphs: 26 %
MCH: 29.4 pg (ref 26.6–33.0)
MCHC: 32.6 g/dL (ref 31.5–35.7)
MCV: 90 fL (ref 79–97)
Monocytes Absolute: 0.4 10*3/uL (ref 0.1–0.9)
Monocytes: 3 %
Neutrophils Absolute: 9.7 10*3/uL — ABNORMAL HIGH (ref 1.4–7.0)
Neutrophils: 69 %
Platelets: 440 10*3/uL (ref 150–450)
RBC: 4.9 x10E6/uL (ref 3.77–5.28)
RDW: 13.2 % (ref 11.7–15.4)
WBC: 14.1 10*3/uL — ABNORMAL HIGH (ref 3.4–10.8)

## 2021-04-23 LAB — GLUCOSE, POCT (MANUAL RESULT ENTRY): POC Glucose: 268 mg/dl — AB (ref 70–99)

## 2021-04-23 MED ORDER — RYBELSUS 3 MG PO TABS: 3.0000 mg | ORAL_TABLET | Freq: Every day | ORAL | 0 refills | Status: DC

## 2021-04-23 NOTE — Progress Notes (Signed)
Blood count abnormal. Wbc elevated. No source of infection. Repeat cbc.  ?Liver function normal.  ?Kidney function normal.  ?Cholesterol: trigs up. Work on getting sugars down. Discussed diet.  ?HBA1C: 9.1. Add rybelsus 3 mg qam without food. Continue metformin and glipizide. ?Follow up in 1 month.  ?Discussed with patient at her appointment. Check sugars daily.

## 2021-04-23 NOTE — Patient Instructions (Addendum)
Rybelsus 3 mg una pastilla cada dia en ayunas ?

## 2021-04-24 LAB — MICROALBUMIN / CREATININE URINE RATIO
Creatinine, Urine: 23.1 mg/dL
Microalb/Creat Ratio: 39 mg/g creat — ABNORMAL HIGH (ref 0–29)
Microalbumin, Urine: 9 ug/mL

## 2021-04-25 ENCOUNTER — Other Ambulatory Visit: Payer: Self-pay

## 2021-04-25 DIAGNOSIS — D72828 Other elevated white blood cell count: Secondary | ICD-10-CM

## 2021-04-25 MED ORDER — LISINOPRIL 5 MG PO TABS: 5.0000 mg | ORAL_TABLET | Freq: Every day | ORAL | 3 refills | Status: DC

## 2021-04-26 DIAGNOSIS — D72829 Elevated white blood cell count, unspecified: Secondary | ICD-10-CM | POA: Insufficient documentation

## 2021-04-26 DIAGNOSIS — H40033 Anatomical narrow angle, bilateral: Secondary | ICD-10-CM | POA: Diagnosis not present

## 2021-04-26 DIAGNOSIS — Z961 Presence of intraocular lens: Secondary | ICD-10-CM | POA: Diagnosis not present

## 2021-04-26 DIAGNOSIS — E119 Type 2 diabetes mellitus without complications: Secondary | ICD-10-CM | POA: Diagnosis not present

## 2021-04-26 NOTE — Assessment & Plan Note (Signed)
Recommend continue to work on eating healthy diet and exercise.Continue with Rosuvastatin 10 mg take 1 tablet daily. ?

## 2021-04-26 NOTE — Assessment & Plan Note (Signed)
The current medical regimen is effective;  continue present plan and medications.  

## 2021-04-26 NOTE — Assessment & Plan Note (Signed)
Well controlled. No changes to medicines.  Continue to work on eating a healthy diet and exercise.    

## 2021-04-26 NOTE — Assessment & Plan Note (Signed)
Control: Fair ?Recommend check sugars fasting daily. ?Recommend check feet daily. ?Recommend annual eye exams. ?Medicines: Metformin 500 mg take 1 tablet daily, Glipizide 5 mg take 1/2 tablet in the AM and 1 tablet in the PM. Add Rybelsus 3 mg una pastilla cada dia en ayunas ?Continue to work on eating a healthy diet and exercise.  ?   ?

## 2021-04-26 NOTE — Assessment & Plan Note (Signed)
CBC was ordered. ?

## 2021-04-26 NOTE — Assessment & Plan Note (Signed)
Recommend continue to work on eating healthy diet and exercise.  

## 2021-05-04 ENCOUNTER — Other Ambulatory Visit: Payer: Medicare HMO

## 2021-05-04 DIAGNOSIS — D72828 Other elevated white blood cell count: Secondary | ICD-10-CM | POA: Diagnosis not present

## 2021-05-04 LAB — CBC WITH DIFFERENTIAL/PLATELET
Basophils Absolute: 0.1 10*3/uL (ref 0.0–0.2)
Basos: 1 %
EOS (ABSOLUTE): 0.3 10*3/uL (ref 0.0–0.4)
Eos: 2 %
Hematocrit: 42.9 % (ref 34.0–46.6)
Hemoglobin: 14 g/dL (ref 11.1–15.9)
Immature Grans (Abs): 0 10*3/uL (ref 0.0–0.1)
Immature Granulocytes: 0 %
Lymphocytes Absolute: 5.2 10*3/uL — ABNORMAL HIGH (ref 0.7–3.1)
Lymphs: 39 %
MCH: 29.2 pg (ref 26.6–33.0)
MCHC: 32.6 g/dL (ref 31.5–35.7)
MCV: 90 fL (ref 79–97)
Monocytes Absolute: 0.7 10*3/uL (ref 0.1–0.9)
Monocytes: 5 %
Neutrophils Absolute: 7 10*3/uL (ref 1.4–7.0)
Neutrophils: 53 %
Platelets: 349 10*3/uL (ref 150–450)
RBC: 4.79 x10E6/uL (ref 3.77–5.28)
RDW: 13.6 % (ref 11.7–15.4)
WBC: 13.4 10*3/uL — ABNORMAL HIGH (ref 3.4–10.8)

## 2021-05-08 NOTE — Telephone Encounter (Signed)
Rescheduled for 05/10/21 ?

## 2021-05-09 ENCOUNTER — Encounter: Payer: Self-pay | Admitting: Legal Medicine

## 2021-05-09 ENCOUNTER — Ambulatory Visit (INDEPENDENT_AMBULATORY_CARE_PROVIDER_SITE_OTHER): Payer: Medicare HMO | Admitting: Legal Medicine

## 2021-05-09 VITALS — BP 110/80 | HR 74 | Temp 97.9°F | Resp 16 | Ht <= 58 in | Wt 194.0 lb

## 2021-05-09 DIAGNOSIS — N3000 Acute cystitis without hematuria: Secondary | ICD-10-CM

## 2021-05-09 DIAGNOSIS — D72820 Lymphocytosis (symptomatic): Secondary | ICD-10-CM | POA: Diagnosis not present

## 2021-05-09 LAB — POCT URINALYSIS DIP (CLINITEK)
Bilirubin, UA: NEGATIVE
Blood, UA: NEGATIVE
Glucose, UA: NEGATIVE mg/dL
Ketones, POC UA: NEGATIVE mg/dL
Leukocytes, UA: NEGATIVE
Nitrite, UA: NEGATIVE
POC PROTEIN,UA: NEGATIVE
Spec Grav, UA: 1.01 (ref 1.010–1.025)
Urobilinogen, UA: 0.2 E.U./dL
pH, UA: 6 (ref 5.0–8.0)

## 2021-05-09 NOTE — Progress Notes (Signed)
? ?Acute Office Visit ? ?Subjective:  ? ? Patient ID: Julia Johns, female    DOB: November 27, 1950, 71 y.o.   MRN: [id number] ? ?Chief Complaint  ?Patient presents with  ? WBC elevated  ? ? ?HPI: ?Patient is in today for sore throat and headache since 2 days ago and she mentioned some abdominal pain on epigastric area. WBC remains high last 13, 400 for 2 weeks.  Lymphocyte preponderance. ?She is having more abdominal pain last week.  No night sweats or fevers.  She is having headache. She is to go home every year and in 2 weeks to [place]. No history of TB. ? ?No history of TB.   ? ?Past Medical History:  ?Diagnosis Date  ? Chronic kidney disease   ? Chronic respiratory failure with hypoxia (HCC)   ? 10/09/2018-overnight oximetry- duration of sleep 7 hours and 34 minutes, on room air, SPO2 less than 88% 1 hour and 34 minutes and 28 seconds.   ? Diabetes mellitus without complication ([place])   ? Grade II hemorrhoids 07/03/2017  ? Last Assessment & Plan:  Formatting of this note might be different from the original. Fiber supplement stool softener adequate hydration and local hemorrhoid management is advisable.  ? Vasculitis ([place])   ? 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 ? ?Past Surgical History:  ?Procedure Laterality Date  ? VIDEO BRONCHOSCOPY Bilateral 06/18/2018  ? Procedure: VIDEO BRONCHOSCOPY WITH FLUORO;  Surgeon: Rigoberto Noel, MD;  Location: [facility];  Service: Cardiopulmonary;  Laterality: Bilateral;  ? VIDEO BRONCHOSCOPY WITH ENDOBRONCHIAL ULTRASOUND N/A 06/25/2018  ? Procedure: VIDEO BRONCHOSCOPY WITH ENDOBRONCHIAL ULTRASOUND;  Surgeon: Rigoberto Noel, MD;  Location: [facility];  Service: Thoracic;  Laterality: N/A;  ? ? ?Family History  ?Problem Relation Age of Onset  ? Uterine cancer Mother   ? Breast cancer Neg Hx   ? ? ?Social History  ? ?Socioeconomic History  ? Marital status: Single  ?  Spouse name: Not on file  ? Number of children: Not on file  ? Years of education: Not on file  ? Highest  education level: Not on file  ?Occupational History  ? Not on file  ?Tobacco Use  ? Smoking status: Never  ? Smokeless tobacco: Never  ?Substance and Sexual Activity  ? Alcohol use: Never  ? Drug use: Never  ? Sexual activity: Not Currently  ?Other Topics Concern  ? Not on file  ?Social History Narrative  ? Patient primary language is spanish, she lives at home with her son [name] and daughter in law [name]   ? ?Social Determinants of Health  ? ?Financial Resource Strain: Not on file  ?Food Insecurity: Not on file  ?Transportation Needs: Not on file  ?Physical Activity: Not on file  ?Stress: Not on file  ?Social Connections: Not on file  ?Intimate Partner Violence: Not on file  ? ? ?Outpatient Medications Prior to Visit  ?Medication Sig Dispense Refill  ? ACCU-CHEK AVIVA PLUS test strip     ? Accu-Chek Softclix Lancets lancets USE TO CHECK BLOOD SUGAR TWICE A DAY E11.69 100 each 2  ? chlorthalidone (HYGROTON) 25 MG tablet Take 0.5 tablets (12.5 mg total) by mouth daily. 15 tablet 0  ? glipiZIDE (GLUCOTROL) 5 MG tablet TAKE 1/2 (ONE-HALF) TABLET BY MOUTH BEFORE BREAKFAST AND 1 TABLET IN THE EVENING BEFORE  SUPPER 60 tablet 0  ? lisinopril (ZESTRIL) 5 MG tablet Take 1 tablet (5 mg total) by mouth daily. 90 tablet  3  ? metFORMIN (GLUCOPHAGE) 500 MG tablet TAKE 1 TABLET BY MOUTH ONCE DAILY AFTER  THE  BIGGEST  MEAL 30 tablet 0  ? rosuvastatin (CRESTOR) 10 MG tablet Take 1 tablet (10 mg total) by mouth daily. 30 tablet 0  ? Semaglutide (RYBELSUS) 3 MG TABS Take 3 mg by mouth daily. 30 tablet 0  ? metroNIDAZOLE (METROGEL) 1 % gel Apply topically daily. 45 g 0  ? ?No facility-administered medications prior to visit.  ? ? ?Allergies  ?Allergen Reactions  ? Penicillins Anaphylaxis  ?  Patient stated that within a few minutes of receiving a penicillin IM injection she rapidly became unconscious and required "additional" medication to recover. This occurred when she was ~71 years old and she does not recall what  was given to assist in her recovery. She did not develop a rash.   ? Tetracycline Anaphylaxis  ?  Loss of consciousness after injection   ? ? ?Review of Systems  ?Constitutional:  Negative for chills, fatigue and fever.  ?HENT:  Positive for sore throat. Negative for congestion and ear pain.   ?Respiratory:  Negative for cough and shortness of breath.   ?Cardiovascular:  Negative for chest pain and palpitations.  ?Gastrointestinal:  Positive for abdominal pain. Negative for constipation, diarrhea, nausea and vomiting.  ?Endocrine: Negative for polydipsia, polyphagia and polyuria.  ?Genitourinary:  Negative for difficulty urinating and dysuria.  ?Musculoskeletal:  Negative for arthralgias, back pain and myalgias.  ?Skin:  Negative for rash.  ?Neurological:  Positive for headaches.  ?Psychiatric/Behavioral:  Negative for dysphoric mood. The patient is not nervous/anxious.   ? ?   ?Objective:  ?  ?Physical Exam ? ?BP 110/80   Pulse 74   Temp 97.9 ?F (36.6 ?C)   Resp 16   Ht 4' 9"  (1.448 m)   Wt 194 lb (88 kg)   SpO2 94%   BMI 41.98 kg/m?  ?Wt Readings from Last 3 Encounters:  ?05/09/21 194 lb (88 kg)  ?04/23/21 194 lb (88 kg)  ?01/23/21 195 lb (88.5 kg)  ? ? ?Health Maintenance Due  ?Topic Date Due  ? TETANUS/TDAP  Never done  ? Zoster Vaccines- Shingrix (1 of 2) Never done  ? Pneumonia Vaccine 35+ Years old (2 - PPSV23 if available, else PCV20) 04/18/2021  ? FOOT EXAM  04/18/2021  ? OPHTHALMOLOGY EXAM  04/25/2021  ? ? ?There are no preventive care reminders to display for this patient. ? ? ?No results found for: TSH ?Lab Results  ?Component Value Date  ? WBC 13.4 (H) 05/04/2021  ? HGB 14.0 05/04/2021  ? HCT 42.9 05/04/2021  ? MCV 90 05/04/2021  ? PLT 349 05/04/2021  ? ?Lab Results  ?Component Value Date  ? NA 140 04/20/2021  ? K 4.1 04/20/2021  ? CO2 23 04/20/2021  ? GLUCOSE 137 (H) 04/20/2021  ? BUN 17 04/20/2021  ? CREATININE 0.60 04/20/2021  ? BILITOT 0.3 04/20/2021  ? ALKPHOS 121 04/20/2021  ? AST 22  04/20/2021  ? ALT 43 (H) 04/20/2021  ? PROT 6.4 04/20/2021  ? ALBUMIN 4.1 04/20/2021  ? CALCIUM 10.0 04/20/2021  ? ANIONGAP 10 12/27/2018  ? EGFR 96 04/20/2021  ? GFR 59.89 (L) 10/02/2018  ? ?Lab Results  ?Component Value Date  ? CHOL 195 04/20/2021  ? ?Lab Results  ?Component Value Date  ? HDL 62 04/20/2021  ? ?Lab Results  ?Component Value Date  ? [place] 96 04/20/2021  ? ?Lab Results  ?Component Value Date  ? TRIG  222 (H) 04/20/2021  ? ?Lab Results  ?Component Value Date  ? CHOLHDL 3.1 04/20/2021  ? ?Lab Results  ?Component Value Date  ? HGBA1C 9.1 (H) 04/20/2021  ? ? ?   ?Assessment & Plan:  ? ?Diagnoses and all orders for this visit: ?Lymphocytosis ?-     C-reactive protein ?-     CBC with Differential ?-     Quantiferon tb gold assay ?Patient has persistent lymphocytosis at least over the last several weeks she has had no night sweats fevers or chills no evidence for any infections.  She has dentures but no mouth ulcers she has nail fungus but no evidence for infection of the nails otherwise.  She has occasional mid abdominal pain and has a long history of ANCA associated vasculitis in 2020.  She has had no respiratory problems to suggest this has recurred although we will test a C-reactive protein and possibly other test if this if it shows a elevated inflammatory factors.  We will we will we will recheck CBC today and get the QuantiFERON for possible occult tuberculosis infection ?Acute cystitis without hematuria ?-     POCT URINALYSIS DIP (CLINITEK) ?Urinalysis is normal no evidence for infection. ?  ? ? ?  ? ?Follow-up: Return with Dr. Tobie Poet at next scheduled appointment. ? ?An After Visit Summary was printed and given to the patient. ? ?Reinaldo Meeker, MD ?[facility] ?[phone] ?

## 2021-05-10 DIAGNOSIS — N6489 Other specified disorders of breast: Secondary | ICD-10-CM | POA: Diagnosis not present

## 2021-05-10 DIAGNOSIS — R928 Other abnormal and inconclusive findings on diagnostic imaging of breast: Secondary | ICD-10-CM | POA: Diagnosis not present

## 2021-05-10 LAB — CBC WITH DIFFERENTIAL/PLATELET
Basophils Absolute: 0.1 10*3/uL (ref 0.0–0.2)
Basos: 1 %
EOS (ABSOLUTE): 0.4 10*3/uL (ref 0.0–0.4)
Eos: 4 %
Hematocrit: 42.4 % (ref 34.0–46.6)
Hemoglobin: 14.3 g/dL (ref 11.1–15.9)
Immature Grans (Abs): 0.1 10*3/uL (ref 0.0–0.1)
Immature Granulocytes: 1 %
Lymphocytes Absolute: 4 10*3/uL — ABNORMAL HIGH (ref 0.7–3.1)
Lymphs: 38 %
MCH: 30.1 pg (ref 26.6–33.0)
MCHC: 33.7 g/dL (ref 31.5–35.7)
MCV: 89 fL (ref 79–97)
Monocytes Absolute: 0.5 10*3/uL (ref 0.1–0.9)
Monocytes: 4 %
Neutrophils Absolute: 5.5 10*3/uL (ref 1.4–7.0)
Neutrophils: 52 %
Platelets: 330 10*3/uL (ref 150–450)
RBC: 4.75 x10E6/uL (ref 3.77–5.28)
RDW: 13.4 % (ref 11.7–15.4)
WBC: 10.4 10*3/uL (ref 3.4–10.8)

## 2021-05-10 LAB — C-REACTIVE PROTEIN: CRP: 19 mg/L — ABNORMAL HIGH (ref 0–10)

## 2021-05-10 NOTE — Addendum Note (Signed)
Addended by: Thompson Caul I on: 05/10/2021 08:14 AM ? ? Modules accepted: Orders ? ?

## 2021-05-10 NOTE — Progress Notes (Signed)
CRP is elevated but getting smaller, CBC wbc now 10.4, seems to have some partial resolution of symptoms, will need to watch. ?lp

## 2021-05-14 ENCOUNTER — Other Ambulatory Visit: Payer: Self-pay

## 2021-05-14 DIAGNOSIS — R928 Other abnormal and inconclusive findings on diagnostic imaging of breast: Secondary | ICD-10-CM

## 2021-05-21 ENCOUNTER — Ambulatory Visit (INDEPENDENT_AMBULATORY_CARE_PROVIDER_SITE_OTHER): Payer: Medicare HMO | Admitting: Family Medicine

## 2021-05-21 ENCOUNTER — Encounter: Payer: Self-pay | Admitting: Family Medicine

## 2021-05-21 VITALS — BP 110/70 | HR 88 | Temp 98.3°F | Resp 15 | Ht <= 58 in | Wt 189.0 lb

## 2021-05-21 DIAGNOSIS — D72820 Lymphocytosis (symptomatic): Secondary | ICD-10-CM

## 2021-05-21 DIAGNOSIS — E1159 Type 2 diabetes mellitus with other circulatory complications: Secondary | ICD-10-CM

## 2021-05-21 DIAGNOSIS — E1169 Type 2 diabetes mellitus with other specified complication: Secondary | ICD-10-CM | POA: Diagnosis not present

## 2021-05-21 DIAGNOSIS — N3000 Acute cystitis without hematuria: Secondary | ICD-10-CM

## 2021-05-21 DIAGNOSIS — J301 Allergic rhinitis due to pollen: Secondary | ICD-10-CM

## 2021-05-21 DIAGNOSIS — E785 Hyperlipidemia, unspecified: Secondary | ICD-10-CM | POA: Diagnosis not present

## 2021-05-21 DIAGNOSIS — E1165 Type 2 diabetes mellitus with hyperglycemia: Secondary | ICD-10-CM | POA: Diagnosis not present

## 2021-05-21 DIAGNOSIS — I152 Hypertension secondary to endocrine disorders: Secondary | ICD-10-CM

## 2021-05-21 LAB — POCT URINALYSIS DIP (CLINITEK)
Bilirubin, UA: NEGATIVE
Blood, UA: NEGATIVE
Glucose, UA: NEGATIVE mg/dL
Ketones, POC UA: NEGATIVE mg/dL
Leukocytes, UA: NEGATIVE
Nitrite, UA: NEGATIVE
POC PROTEIN,UA: NEGATIVE
Spec Grav, UA: 1.01 (ref 1.010–1.025)
Urobilinogen, UA: 0.2 E.U./dL
pH, UA: 6 (ref 5.0–8.0)

## 2021-05-21 MED ORDER — METFORMIN HCL 500 MG PO TABS: ORAL_TABLET | ORAL | 1 refills | Status: DC

## 2021-05-21 MED ORDER — ROSUVASTATIN CALCIUM 10 MG PO TABS: 10.0000 mg | ORAL_TABLET | Freq: Every day | ORAL | 1 refills | Status: DC

## 2021-05-21 MED ORDER — CHLORTHALIDONE 25 MG PO TABS: 12.5000 mg | ORAL_TABLET | Freq: Every day | ORAL | 1 refills | Status: DC

## 2021-05-21 MED ORDER — RYBELSUS 7 MG PO TABS: 7.0000 mg | ORAL_TABLET | Freq: Every day | ORAL | 0 refills | Status: DC

## 2021-05-21 NOTE — Patient Instructions (Signed)
-  Aumentar Rybelsus 7 mg todos los dias y no tomar glipizide.  ?- Tomar cetirizine en la noche, ya no mas en la manana. ?- Mantener la dieta baja en carbohidratos. ?

## 2021-05-21 NOTE — Progress Notes (Signed)
? ?Subjective:  ?Patient ID: Julia Johns, female    DOB: 06/30/1950  Age: 71 y.o. MRN: [id number] ? ?Chief Complaint  ?Patient presents with  ? Diabetes  ? ? ?HPI ?Patient is here for follow up on Rybelsus 3 mg daily. Her blood sugar has been 105-158 mg/dl. She denied any side effects. Last A1C 9.1. Eating healthier.  ? ?She is going to [place] for couple months and she needs refills on her medication. ? ? ?Current Outpatient Medications on File Prior to Visit  ?Medication Sig Dispense Refill  ? ACCU-CHEK AVIVA PLUS test strip     ? Accu-Chek Softclix Lancets lancets USE TO CHECK BLOOD SUGAR TWICE A DAY E11.69 100 each 2  ? glipiZIDE (GLUCOTROL) 5 MG tablet TAKE 1/2 (ONE-HALF) TABLET BY MOUTH BEFORE BREAKFAST AND 1 TABLET IN THE EVENING BEFORE  SUPPER 60 tablet 0  ? lisinopril (ZESTRIL) 5 MG tablet Take 1 tablet (5 mg total) by mouth daily. 90 tablet 3  ? ?No current facility-administered medications on file prior to visit.  ? ?Past Medical History:  ?Diagnosis Date  ? Chronic kidney disease   ? Chronic respiratory failure with hypoxia (HCC)   ? 10/09/2018-overnight oximetry- duration of sleep 7 hours and 34 minutes, on room air, SPO2 less than 88% 1 hour and 34 minutes and 28 seconds.   ? Diabetes mellitus without complication ([place])   ? Grade II hemorrhoids 07/03/2017  ? Last Assessment & Plan:  Formatting of this note might be different from the original. Fiber supplement stool softener adequate hydration and local hemorrhoid management is advisable.  ? Vasculitis ([place])   ? 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 ?Past Surgical History:  ?Procedure Laterality Date  ? VIDEO BRONCHOSCOPY Bilateral 06/18/2018  ? Procedure: VIDEO BRONCHOSCOPY WITH FLUORO;  Surgeon: Rigoberto Noel, MD;  Location: [facility];  Service: Cardiopulmonary;  Laterality: Bilateral;  ? VIDEO BRONCHOSCOPY WITH ENDOBRONCHIAL ULTRASOUND N/A 06/25/2018  ? Procedure: VIDEO BRONCHOSCOPY WITH ENDOBRONCHIAL ULTRASOUND;  Surgeon: Rigoberto Noel, MD;  Location: [facility];  Service: Thoracic;  Laterality: N/A;  ?  ?Family History  ?Problem Relation Age of Onset  ? Uterine cancer Mother   ? Breast cancer Neg Hx   ? ?Social History  ? ?Socioeconomic History  ? Marital status: Single  ?  Spouse name: Not on file  ? Number of children: Not on file  ? Years of education: Not on file  ? Highest education level: Not on file  ?Occupational History  ? Not on file  ?Tobacco Use  ? Smoking status: Never  ? Smokeless tobacco: Never  ?Substance and Sexual Activity  ? Alcohol use: Never  ? Drug use: Never  ? Sexual activity: Not Currently  ?Other Topics Concern  ? Not on file  ?Social History Narrative  ? Patient primary language is spanish, she lives at home with her son [name] and daughter in law [name]   ? ?Social Determinants of Health  ? ?Financial Resource Strain: Not on file  ?Food Insecurity: Not on file  ?Transportation Needs: Not on file  ?Physical Activity: Not on file  ?Stress: Not on file  ?Social Connections: Not on file  ? ? ?Review of Systems  ?Constitutional:  Negative for chills, fatigue and fever.  ?HENT:  Positive for congestion, postnasal drip, rhinorrhea and sore throat. Negative for ear pain.   ?Respiratory:  Positive for cough. Negative for shortness of breath.   ?Cardiovascular:  Negative for chest pain and  palpitations.  ?Gastrointestinal:  Negative for abdominal pain, constipation, diarrhea, nausea and vomiting.  ?Endocrine: Negative for polydipsia, polyphagia and polyuria.  ?Genitourinary:  Negative for difficulty urinating and dysuria.  ?Musculoskeletal:  Negative for arthralgias, back pain and myalgias.  ?Skin:  Negative for rash.  ?Neurological:  Negative for headaches.  ?Psychiatric/Behavioral:  Negative for dysphoric mood. The patient is not nervous/anxious.   ? ? ?Objective:  ?BP 110/70   Pulse 88   Temp 98.3 ?F (36.8 ?C)   Resp 15   Ht '4\' 9"'$  (1.448 m)   Wt 189 lb (85.7 kg)   SpO2 96%   BMI 40.90 kg/m?  ? ? ?  05/21/2021   ?  1:51 PM 05/09/2021  ?  1:43 PM 04/23/2021  ?  1:39 PM  ?BP/Weight  ?Systolic BP 983 382 505  ?Diastolic BP 70 80 72  ?Wt. (Lbs) 189 194 194  ?BMI 40.9 kg/m2 41.98 kg/m2 41.98 kg/m2  ? ? ?Physical Exam ?Vitals reviewed.  ?Constitutional:   ?   Appearance: Normal appearance.  ?HENT:  ?   Right Ear: Tympanic membrane, ear canal and external ear normal.  ?   Left Ear: Tympanic membrane, ear canal and external ear normal.  ?   Nose: Congestion (pale turbinates.) and rhinorrhea present.  ?   Mouth/Throat:  ?   Pharynx: Oropharynx is clear. No oropharyngeal exudate or posterior oropharyngeal erythema.  ?Cardiovascular:  ?   Rate and Rhythm: Normal rate and regular rhythm.  ?   Heart sounds: Normal heart sounds. No murmur heard. ?Pulmonary:  ?   Effort: Pulmonary effort is normal. No respiratory distress.  ?   Breath sounds: Normal breath sounds.  ?Lymphadenopathy:  ?   Cervical: No cervical adenopathy.  ?Neurological:  ?   Mental Status: She is alert and oriented to person, place, and time.  ?Psychiatric:     ?   Mood and Affect: Mood normal.     ?   Behavior: Behavior normal.  ? ? ?Diabetic Foot Exam - Simple   ?No data filed ?  ?  ? ?Lab Results  ?Component Value Date  ? WBC 10.4 05/09/2021  ? HGB 14.3 05/09/2021  ? HCT 42.4 05/09/2021  ? PLT 330 05/09/2021  ? GLUCOSE 137 (H) 04/20/2021  ? CHOL 195 04/20/2021  ? TRIG 222 (H) 04/20/2021  ? HDL 62 04/20/2021  ? [place] 96 04/20/2021  ? ALT 43 (H) 04/20/2021  ? AST 22 04/20/2021  ? NA 140 04/20/2021  ? K 4.1 04/20/2021  ? CL 103 04/20/2021  ? CREATININE 0.60 04/20/2021  ? BUN 17 04/20/2021  ? CO2 23 04/20/2021  ? INR 1.2 07/05/2018  ? HGBA1C 9.1 (H) 04/20/2021  ? MICROALBUR 30 04/18/2020  ? ? ? ? ?Assessment & Plan:  ? ?Problem List Items Addressed This Visit   ? ?  ? Cardiovascular and Mediastinum  ? Hypertension associated with diabetes ([place])  ?  Well controlled.  ?No changes to medicines. Continue chlorthalidone and lisinopril. ?Continue to work on eating a healthy diet  and exercise.  ? ?  ?  ? Relevant Medications  ? chlorthalidone (HYGROTON) 25 MG tablet  ? metFORMIN (GLUCOPHAGE) 500 MG tablet  ? rosuvastatin (CRESTOR) 10 MG tablet  ? Semaglutide (RYBELSUS) 7 MG TABS  ?  ? Respiratory  ? Allergic rhinitis  ?  Start on zyrtec 10 mg before bed  ? ?  ?  ?  ? Endocrine  ? Dyslipidemia associated with type 2 diabetes mellitus (  [place])  ?  Well controlled.  ?No changes to medicines. Continue crestor 10 mg before bed.  ?Continue to work on eating a healthy diet and exercise.  ?  ? ?  ?  ? Relevant Medications  ? metFORMIN (GLUCOPHAGE) 500 MG tablet  ? rosuvastatin (CRESTOR) 10 MG tablet  ? Semaglutide (RYBELSUS) 7 MG TABS  ? Type 2 diabetes mellitus with hyperglycemia, without long-term current use of insulin ([place])  ?  Increase rybelsus 7 mg daily.  ?Recommended hold glipizide if sugars less than 100.  ? ?  ?  ? Relevant Medications  ? metFORMIN (GLUCOPHAGE) 500 MG tablet  ? rosuvastatin (CRESTOR) 10 MG tablet  ? Semaglutide (RYBELSUS) 7 MG TABS  ?  ? Genitourinary  ? Acute cystitis without hematuria  ?  Resolved.  ? ?  ?  ? Relevant Orders  ? POCT URINALYSIS DIP (CLINITEK) (Completed)  ?  ? Other  ? Leucocytosis - Primary  ?  Resolved.  ? ?  ?  ?. ? ?Meds ordered this encounter  ?Medications  ? chlorthalidone (HYGROTON) 25 MG tablet  ?  Sig: Take 0.5 tablets (12.5 mg total) by mouth daily.  ?  Dispense:  45 tablet  ?  Refill:  1  ? metFORMIN (GLUCOPHAGE) 500 MG tablet  ?  Sig: TAKE 1 TABLET BY MOUTH ONCE DAILY AFTER  THE  BIGGEST  MEAL  ?  Dispense:  90 tablet  ?  Refill:  1  ? rosuvastatin (CRESTOR) 10 MG tablet  ?  Sig: Take 1 tablet (10 mg total) by mouth daily.  ?  Dispense:  90 tablet  ?  Refill:  1  ? Semaglutide (RYBELSUS) 7 MG TABS  ?  Sig: Take 7 mg by mouth daily.  ?  Dispense:  90 tablet  ?  Refill:  0  ? ? ?Orders Placed This Encounter  ?Procedures  ? POCT URINALYSIS DIP (CLINITEK)  ?  ? ?Follow-up: Return in about 3 months (around 08/21/2021). ? ?An After Visit Summary was  printed and given to the patient. ? ?[name], MD ?[facility] ?[phone] ?

## 2021-06-03 DIAGNOSIS — N3000 Acute cystitis without hematuria: Secondary | ICD-10-CM | POA: Insufficient documentation

## 2021-06-03 DIAGNOSIS — J309 Allergic rhinitis, unspecified: Secondary | ICD-10-CM | POA: Insufficient documentation

## 2021-06-03 NOTE — Assessment & Plan Note (Signed)
Resolved

## 2021-06-03 NOTE — Assessment & Plan Note (Signed)
Start on zyrtec 10 mg before bed.  ?

## 2021-06-03 NOTE — Assessment & Plan Note (Signed)
Well controlled.  No changes to medicines. Continue crestor 10 mg before bed.  Continue to work on eating a healthy diet and exercise.   

## 2021-06-03 NOTE — Assessment & Plan Note (Addendum)
Well controlled.  ?No changes to medicines. Continue chlorthalidone and lisinopril. ?Continue to work on eating a healthy diet and exercise.  ? ?

## 2021-06-03 NOTE — Assessment & Plan Note (Signed)
Increase rybelsus 7 mg daily.  ?Recommended hold glipizide if sugars less than 100.  ?

## 2021-08-27 ENCOUNTER — Encounter: Payer: Self-pay | Admitting: Family Medicine

## 2021-08-27 ENCOUNTER — Ambulatory Visit (INDEPENDENT_AMBULATORY_CARE_PROVIDER_SITE_OTHER): Payer: Medicare HMO | Admitting: Family Medicine

## 2021-08-27 VITALS — BP 110/80 | HR 100 | Temp 98.5°F | Resp 15 | Ht <= 58 in | Wt 188.0 lb

## 2021-08-27 DIAGNOSIS — E782 Mixed hyperlipidemia: Secondary | ICD-10-CM | POA: Diagnosis not present

## 2021-08-27 DIAGNOSIS — I152 Hypertension secondary to endocrine disorders: Secondary | ICD-10-CM

## 2021-08-27 DIAGNOSIS — E1121 Type 2 diabetes mellitus with diabetic nephropathy: Secondary | ICD-10-CM | POA: Diagnosis not present

## 2021-08-27 DIAGNOSIS — I7782 Antineutrophilic cytoplasmic antibody (ANCA) vasculitis: Secondary | ICD-10-CM | POA: Diagnosis not present

## 2021-08-27 DIAGNOSIS — E785 Hyperlipidemia, unspecified: Secondary | ICD-10-CM | POA: Diagnosis not present

## 2021-08-27 DIAGNOSIS — E1159 Type 2 diabetes mellitus with other circulatory complications: Secondary | ICD-10-CM

## 2021-08-27 DIAGNOSIS — E1165 Type 2 diabetes mellitus with hyperglycemia: Secondary | ICD-10-CM | POA: Diagnosis not present

## 2021-08-27 DIAGNOSIS — E1169 Type 2 diabetes mellitus with other specified complication: Secondary | ICD-10-CM | POA: Diagnosis not present

## 2021-08-27 DIAGNOSIS — Z6841 Body Mass Index (BMI) 40.0 and over, adult: Secondary | ICD-10-CM

## 2021-08-27 MED ORDER — METFORMIN HCL 500 MG PO TABS: ORAL_TABLET | ORAL | 1 refills | Status: DC

## 2021-08-27 MED ORDER — ROSUVASTATIN CALCIUM 10 MG PO TABS: 10.0000 mg | ORAL_TABLET | Freq: Every day | ORAL | 1 refills | Status: DC

## 2021-08-27 MED ORDER — RYBELSUS 7 MG PO TABS: 7.0000 mg | ORAL_TABLET | Freq: Every day | ORAL | 0 refills | Status: DC

## 2021-08-27 MED ORDER — CHLORTHALIDONE 25 MG PO TABS: 12.5000 mg | ORAL_TABLET | Freq: Every day | ORAL | 1 refills | Status: DC

## 2021-08-27 MED ORDER — ACCU-CHEK AVIVA PLUS VI STRP: ORAL_STRIP | 12 refills | Status: AC

## 2021-08-27 MED ORDER — ACCU-CHEK SOFTCLIX LANCETS MISC: 12 refills | Status: DC

## 2021-08-27 NOTE — Assessment & Plan Note (Addendum)
Await labs/testing for assessment and recommendations. Recommend check sugars fasting daily. Recommend check feet daily. Recommend annual eye exams. Continue to work on eating a healthy diet and exercise.  Labs drawn today.   Continue Metformin 500 mg daily, Rybelsus 7 mg daily. Last Eye Exam: 04/25/2020

## 2021-08-27 NOTE — Assessment & Plan Note (Signed)
Recommend continue to work on eating healthy diet and exercise.  

## 2021-08-27 NOTE — Assessment & Plan Note (Signed)
Follows with nephrology.

## 2021-08-27 NOTE — Assessment & Plan Note (Addendum)
Well controlled.  No changes to medicines. Continue Rosuvastatin 10 mg daily. Continue to work on eating a healthy diet and exercise.  Labs drawn today.

## 2021-08-27 NOTE — Progress Notes (Signed)
Subjective:  Patient ID: Julia Johns, female    DOB: [birth year]  Age: 71 y.o. MRN: [id number]  Chief Complaint  Patient presents with   Diabetes   Hyperlipidemia    HPI  Diabetes:  Complications: Hyperlipidemia, Hypertension. Glucose checking: everyday Glucose logs: 115-130 mg/dl Hypoglycemia none Most recent A1C: 9.1 Current medications: Metformin 500 mg daily, Rybelsus 7 mg daily. Last Eye Exam: 04/25/2020 Foot checks: no check her feet.  Hyperlipidemia: Current medications: Rosuvastatin 10 mg daily.  Hypertension: Complications:Diabetes, Hyperlipidemia. Current medications: Chlorthalidone 25 mg 1/2 tablet daily.  Current Outpatient Medications on File Prior to Visit  Medication Sig Dispense Refill   ACCU-CHEK AVIVA PLUS test strip      Accu-Chek Softclix Lancets lancets USE TO CHECK BLOOD SUGAR TWICE A DAY E11.69 100 each 2   lisinopril (ZESTRIL) 5 MG tablet Take 1 tablet (5 mg total) by mouth daily. (Patient not taking: Reported on 08/27/2021) 90 tablet 3   No current facility-administered medications on file prior to visit.   Past Medical History:  Diagnosis Date   Chronic kidney disease    Chronic respiratory failure with hypoxia (HCC)    10/09/2018-overnight oximetry- duration of sleep 7 hours and 34 minutes, on room air, SPO2 less than 88% 1 hour and 34 minutes and 28 seconds.    Diabetes mellitus without complication ([place])    Grade II hemorrhoids 07/03/2017   Last Assessment & Plan:  Formatting of this note might be different from the original. Fiber supplement stool softener adequate hydration and local hemorrhoid management is advisable.   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)    Past Surgical History:  Procedure Laterality Date   VIDEO BRONCHOSCOPY Bilateral 06/18/2018   Procedure: VIDEO BRONCHOSCOPY WITH FLUORO;  Surgeon: Rigoberto Noel, MD;  Location: [facility];  Service: Cardiopulmonary;  Laterality: Bilateral;   VIDEO  BRONCHOSCOPY WITH ENDOBRONCHIAL ULTRASOUND N/A 06/25/2018   Procedure: VIDEO BRONCHOSCOPY WITH ENDOBRONCHIAL ULTRASOUND;  Surgeon: Rigoberto Noel, MD;  Location: [facility];  Service: Thoracic;  Laterality: N/A;    Family History  Problem Relation Age of Onset   Uterine cancer Mother    Breast cancer Neg Hx    Social History   Socioeconomic History   Marital status: Single    Spouse name: Not on file   Number of children: Not on file   Years of education: Not on file   Highest education level: Not on file  Occupational History   Not on file  Tobacco Use   Smoking status: Never   Smokeless tobacco: Never  Substance and Sexual Activity   Alcohol use: Never   Drug use: Never   Sexual activity: Not Currently  Other Topics Concern   Not on file  Social History Narrative   Patient primary language is spanish, she lives at home with her son [name] and daughter in law [name]    Social Determinants of Health   Financial Resource Strain: Low Risk  (07/28/2018)   Overall Financial Resource Strain (CARDIA)    Difficulty of Paying Living Expenses: Not hard at all  Food Insecurity: No Food Insecurity (07/28/2018)   Hunger Vital Sign    Worried About Running Out of Food in the Last Year: Never true    Ran Out of Food in the Last Year: Never true  Transportation Needs: No Transportation Needs (07/28/2018)   PRAPARE - Hydrologist (Medical): No    Lack of Transportation (Non-Medical):  No  Physical Activity: Not on file  Stress: Not on file  Social Connections: Not on file    Review of Systems  Constitutional:  Negative for chills, fatigue and fever.  HENT:  Negative for congestion, ear pain and sore throat.   Respiratory:  Positive for cough. Negative for shortness of breath.   Cardiovascular:  Negative for chest pain and palpitations.  Gastrointestinal:  Negative for abdominal pain, constipation, diarrhea, nausea and vomiting.  Endocrine: Negative  for polydipsia, polyphagia and polyuria.  Genitourinary:  Negative for difficulty urinating and dysuria.  Musculoskeletal:  Positive for arthralgias (Left knee pain). Negative for back pain and myalgias.  Skin:  Negative for rash.  Neurological:  Negative for headaches.  Psychiatric/Behavioral:  Negative for dysphoric mood. The patient is not nervous/anxious.      Objective:  BP 110/80   Pulse 100   Temp 98.5 F (36.9 C)   Resp 15   Ht '4\' 9"'$  (1.448 m)   Wt 188 lb (85.3 kg)   SpO2 96%   BMI 40.68 kg/m      08/27/2021    1:49 PM 05/21/2021    1:51 PM 05/09/2021    1:43 PM  BP/Weight  Systolic BP 268 341 962  Diastolic BP 80 70 80  Wt. (Lbs) 188 189 194  BMI 40.68 kg/m2 40.9 kg/m2 41.98 kg/m2    Physical Exam Vitals reviewed.  Constitutional:      Appearance: Normal appearance. She is obese.  Neck:     Vascular: No carotid bruit.  Cardiovascular:     Rate and Rhythm: Normal rate and regular rhythm.     Heart sounds: Normal heart sounds.  Pulmonary:     Effort: Pulmonary effort is normal. No respiratory distress.     Breath sounds: Normal breath sounds.  Abdominal:     General: Abdomen is flat. Bowel sounds are normal.     Palpations: Abdomen is soft.     Tenderness: There is no abdominal tenderness.  Neurological:     Mental Status: She is alert and oriented to person, place, and time.  Psychiatric:        Mood and Affect: Mood normal.        Behavior: Behavior normal.     Diabetic Foot Exam - Simple   Simple Foot Form Diabetic Foot exam was performed with the following findings: Yes 08/27/2021  2:13 PM  Visual Inspection See comments: Yes Sensation Testing Intact to touch and monofilament testing bilaterally: Yes Pulse Check Posterior Tibialis and Dorsalis pulse intact bilaterally: Yes Comments Bunions on both feet and thick toenails and callus on sole of her feet.      Lab Results  Component Value Date   WBC 10.4 05/09/2021   HGB 14.3 05/09/2021    HCT 42.4 05/09/2021   PLT 330 05/09/2021   GLUCOSE 137 (H) 04/20/2021   CHOL 195 04/20/2021   TRIG 222 (H) 04/20/2021   HDL 62 04/20/2021   LDLCALC 96 04/20/2021   ALT 43 (H) 04/20/2021   AST 22 04/20/2021   NA 140 04/20/2021   K 4.1 04/20/2021   CL 103 04/20/2021   CREATININE 0.60 04/20/2021   BUN 17 04/20/2021   CO2 23 04/20/2021   INR 1.2 07/05/2018   HGBA1C 9.1 (H) 04/20/2021   MICROALBUR 30 04/18/2020      Assessment & Plan:   Problem List Items Addressed This Visit       Cardiovascular and Mediastinum   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       Relevant Medications   rosuvastatin (CRESTOR) 10 MG tablet   chlorthalidone (HYGROTON) 25 MG tablet   Hypertension associated with diabetes ([place])    Well controlled.  No changes to medicines. Continue Chlorthalidone 25 mg 1/2 tablet daily. Continue to work on eating a healthy diet and exercise.  Labs drawn today.       Relevant Medications   metFORMIN (GLUCOPHAGE) 500 MG tablet   rosuvastatin (CRESTOR) 10 MG tablet   chlorthalidone (HYGROTON) 25 MG tablet   Semaglutide (RYBELSUS) 7 MG TABS   Other Relevant Orders   CBC with Differential/Platelet     Endocrine   Diabetic glomerulopathy ([place])    Await labs/testing for assessment and recommendations. Recommend check sugars fasting daily. Recommend check feet daily. Recommend annual eye exams. Continue to work on eating a healthy diet and exercise.  Labs drawn today.   Continue Metformin 500 mg daily, Rybelsus 7 mg daily. Last Eye Exam: 04/25/2020      Relevant Medications   Accu-Chek Softclix Lancets lancets   glucose blood (ACCU-CHEK AVIVA PLUS) test strip   metFORMIN (GLUCOPHAGE) 500 MG tablet   rosuvastatin (CRESTOR) 10 MG tablet   Semaglutide (RYBELSUS) 7 MG TABS   RESOLVED: Dyslipidemia associated with type 2 diabetes mellitus ([place]) - Primary    Control:  Recommend check sugars fasting daily. Recommend check feet  daily. Recommend annual eye exams. Medicines: Metformin 500 mg daily, Rybelsus 7 mg daily. Continue to work on eating a healthy diet and exercise.  Labs drawn today.        Relevant Medications   metFORMIN (GLUCOPHAGE) 500 MG tablet   rosuvastatin (CRESTOR) 10 MG tablet   Semaglutide (RYBELSUS) 7 MG TABS     Other   Mixed hyperlipidemia    Well controlled.  No changes to medicines. Continue Rosuvastatin 10 mg daily. Continue to work on eating a healthy diet and exercise.  Labs drawn today.       Relevant Medications   rosuvastatin (CRESTOR) 10 MG tablet   chlorthalidone (HYGROTON) 25 MG tablet   Other Relevant Orders   Lipid panel   Morbid obesity with BMI of 40.0-44.9, adult ([place])    Recommend continue to work on eating healthy diet and exercise.       Relevant Medications   metFORMIN (GLUCOPHAGE) 500 MG tablet   Semaglutide (RYBELSUS) 7 MG TABS  .  Meds ordered this encounter  Medications   Accu-Chek Softclix Lancets lancets    Sig: Use as instructed    Dispense:  100 each    Refill:  12   glucose blood (ACCU-CHEK AVIVA PLUS) test strip    Sig: Use as instructed    Dispense:  100 each    Refill:  12   metFORMIN (GLUCOPHAGE) 500 MG tablet    Sig: TAKE 1 TABLET BY MOUTH ONCE DAILY AFTER  THE  BIGGEST  MEAL    Dispense:  90 tablet    Refill:  1   rosuvastatin (CRESTOR) 10 MG tablet    Sig: Take 1 tablet (10 mg total) by mouth daily.    Dispense:  90 tablet    Refill:  1   chlorthalidone (HYGROTON) 25 MG tablet    Sig: Take 0.5 tablets (12.5 mg total) by mouth daily.    Dispense:  45 tablet    Refill:  1   Semaglutide (RYBELSUS) 7 MG TABS    Sig: Take 7 mg by mouth daily.  Dispense:  90 tablet    Refill:  0    Orders Placed This Encounter  Procedures   Comprehensive metabolic panel   Hemoglobin A1c   Lipid panel   CBC with Differential/Platelet     Follow-up: Return in about 3 months (around 11/27/2021) for chronic fasting.  An After Visit  Summary was printed and given to the patient.  [name], MD [facility] [phone]

## 2021-08-27 NOTE — Assessment & Plan Note (Addendum)
Control:  Recommend check sugars fasting daily. Recommend check feet daily. Recommend annual eye exams. Medicines: Metformin 500 mg daily, Rybelsus 7 mg daily. Continue to work on eating a healthy diet and exercise.  Labs drawn today.

## 2021-08-27 NOTE — Assessment & Plan Note (Addendum)
Well controlled.  No changes to medicines. Continue Chlorthalidone 25 mg 1/2 tablet daily. Continue to work on eating a healthy diet and exercise.  Labs drawn today.

## 2021-08-27 NOTE — Patient Instructions (Signed)
Recommend get an eye exam.

## 2021-08-28 LAB — CBC WITH DIFFERENTIAL/PLATELET
Basophils Absolute: 0.1 10*3/uL (ref 0.0–0.2)
Basos: 1 %
EOS (ABSOLUTE): 0.4 10*3/uL (ref 0.0–0.4)
Eos: 4 %
Hematocrit: 41.7 % (ref 34.0–46.6)
Hemoglobin: 13.5 g/dL (ref 11.1–15.9)
Immature Grans (Abs): 0.1 10*3/uL (ref 0.0–0.1)
Immature Granulocytes: 1 %
Lymphocytes Absolute: 4.2 10*3/uL — ABNORMAL HIGH (ref 0.7–3.1)
Lymphs: 40 %
MCH: 28.7 pg (ref 26.6–33.0)
MCHC: 32.4 g/dL (ref 31.5–35.7)
MCV: 89 fL (ref 79–97)
Monocytes Absolute: 0.7 10*3/uL (ref 0.1–0.9)
Monocytes: 7 %
Neutrophils Absolute: 5.1 10*3/uL (ref 1.4–7.0)
Neutrophils: 47 %
Platelets: 360 10*3/uL (ref 150–450)
RBC: 4.7 x10E6/uL (ref 3.77–5.28)
RDW: 13.2 % (ref 11.7–15.4)
WBC: 10.6 10*3/uL (ref 3.4–10.8)

## 2021-08-28 LAB — COMPREHENSIVE METABOLIC PANEL
ALT: 46 IU/L — ABNORMAL HIGH (ref 0–32)
AST: 42 IU/L — ABNORMAL HIGH (ref 0–40)
Albumin/Globulin Ratio: 1.8 (ref 1.2–2.2)
Albumin: 4.2 g/dL (ref 3.8–4.8)
Alkaline Phosphatase: 119 IU/L (ref 44–121)
BUN/Creatinine Ratio: 21 (ref 12–28)
BUN: 15 mg/dL (ref 8–27)
Bilirubin Total: <0.2 mg/dL (ref 0.0–1.2)
CO2: 21 mmol/L (ref 20–29)
Calcium: 9.3 mg/dL (ref 8.7–10.3)
Chloride: 104 mmol/L (ref 96–106)
Creatinine, Ser: 0.72 mg/dL (ref 0.57–1.00)
Globulin, Total: 2.4 g/dL (ref 1.5–4.5)
Glucose: 114 mg/dL — ABNORMAL HIGH (ref 70–99)
Potassium: 4.2 mmol/L (ref 3.5–5.2)
Sodium: 139 mmol/L (ref 134–144)
Total Protein: 6.6 g/dL (ref 6.0–8.5)
eGFR: 89 mL/min/{1.73_m2} (ref >59)

## 2021-08-28 LAB — HEMOGLOBIN A1C
Est. average glucose Bld gHb Est-mCnc: 157 mg/dL
Hgb A1c MFr Bld: 7.1 % — ABNORMAL HIGH (ref 4.8–5.6)

## 2021-08-28 LAB — LIPID PANEL
Chol/HDL Ratio: 2.5 ratio (ref 0.0–4.4)
Cholesterol, Total: 140 mg/dL (ref 100–199)
HDL: 56 mg/dL (ref >39)
LDL Chol Calc (NIH): 63 mg/dL (ref 0–99)
Triglycerides: 115 mg/dL (ref 0–149)
VLDL Cholesterol Cal: 21 mg/dL (ref 5–40)

## 2021-08-28 LAB — CARDIOVASCULAR RISK ASSESSMENT

## 2021-08-28 NOTE — Progress Notes (Signed)
Blood count normal.  Liver function very slightly up.   Kidney function normal.  Cholesterol: good HBA1C: improved from 9.1 to 7.1! Great.  The current medical regimen is effective;  continue present plan and medications. Send any refills they need.

## 2021-09-25 DIAGNOSIS — D631 Anemia in chronic kidney disease: Secondary | ICD-10-CM | POA: Diagnosis not present

## 2021-09-25 DIAGNOSIS — I10 Essential (primary) hypertension: Secondary | ICD-10-CM | POA: Diagnosis not present

## 2021-09-25 DIAGNOSIS — N390 Urinary tract infection, site not specified: Secondary | ICD-10-CM | POA: Diagnosis not present

## 2021-09-25 DIAGNOSIS — N189 Chronic kidney disease, unspecified: Secondary | ICD-10-CM | POA: Diagnosis not present

## 2021-09-25 DIAGNOSIS — I7782 Antineutrophilic cytoplasmic antibody (ANCA) vasculitis: Secondary | ICD-10-CM | POA: Diagnosis not present

## 2021-09-25 DIAGNOSIS — E559 Vitamin D deficiency, unspecified: Secondary | ICD-10-CM | POA: Diagnosis not present

## 2021-12-03 NOTE — Assessment & Plan Note (Addendum)
Control:  Fair Recommend check sugars fasting daily. Recommend check feet daily. Recommend annual eye exams.Increase Rybelsus 14 mg daily. Continue to work on eating a healthy diet and exercise.  Labs drawn today.

## 2021-12-03 NOTE — Assessment & Plan Note (Addendum)
Well controlled.  No changes to medicines. Rosuvastatin 10 mg daily. Continue to work on eating a healthy diet and exercise.  Labs drawn today.   

## 2021-12-03 NOTE — Progress Notes (Unsigned)
Subjective:  Patient ID: Julia Johns, female    DOB: [birth year]  Age: 71 y.o. MRN: [id number]  Chief Complaint  Patient presents with   Diabetes   Hypertension   Hyperlipidemia    HPI Diabetes:  Complications: Hyperlipidemia, Hypertension. Glucose checking: everyday Glucose logs: 110-130 mg/dl Hypoglycemia none Most recent A1C: 7.1 Current medications: Metformin 500 mg daily, Rybelsus 7 mg daily. Last Eye Exam: 04/25/2020 Foot checks: no check her feet.  Hyperlipidemia: Current medications: Rosuvastatin 10 mg daily.  Hypertension: Complications:Diabetes, Hyperlipidemia. Current medications: Chlorthalidone 25 mg 1/2 tablet daily.   Current Outpatient Medications on File Prior to Visit  Medication Sig Dispense Refill   ACCU-CHEK AVIVA PLUS test strip      Accu-Chek Softclix Lancets lancets USE TO CHECK BLOOD SUGAR TWICE A DAY E11.69 100 each 2   Accu-Chek Softclix Lancets lancets Use as instructed 100 each 12   chlorthalidone (HYGROTON) 25 MG tablet Take 0.5 tablets (12.5 mg total) by mouth daily. 45 tablet 1   glucose blood (ACCU-CHEK AVIVA PLUS) test strip Use as instructed 100 each 12   lisinopril (ZESTRIL) 5 MG tablet Take 1 tablet (5 mg total) by mouth daily. (Patient not taking: Reported on 08/27/2021) 90 tablet 3   metFORMIN (GLUCOPHAGE) 500 MG tablet TAKE 1 TABLET BY MOUTH ONCE DAILY AFTER  THE  BIGGEST  MEAL 90 tablet 1   rosuvastatin (CRESTOR) 10 MG tablet Take 1 tablet (10 mg total) by mouth daily. 90 tablet 1   No current facility-administered medications on file prior to visit.   Past Medical History:  Diagnosis Date   Chronic kidney disease    Chronic respiratory failure with hypoxia (HCC)    10/09/2018-overnight oximetry- duration of sleep 7 hours and 34 minutes, on room air, SPO2 less than 88% 1 hour and 34 minutes and 28 seconds.    Diabetes mellitus without complication ([place])    Grade II hemorrhoids 07/03/2017   Last Assessment & Plan:   Formatting of this note might be different from the original. Fiber supplement stool softener adequate hydration and local hemorrhoid management is advisable.   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[place])    Past Surgical History:  Procedure Laterality Date   VIDEO BRONCHOSCOPY Bilateral 06/18/2018   Procedure: VIDEO BRONCHOSCOPY WITH FLUORO;  Surgeon: Rigoberto Noel, MD;  Location: [facility];  Service: Cardiopulmonary;  Laterality: Bilateral;   VIDEO BRONCHOSCOPY WITH ENDOBRONCHIAL ULTRASOUND N/A 06/25/2018   Procedure: VIDEO BRONCHOSCOPY WITH ENDOBRONCHIAL ULTRASOUND;  Surgeon: Rigoberto Noel, MD;  Location: [facility];  Service: Thoracic;  Laterality: N/A;    Family History  Problem Relation Age of Onset   Uterine cancer Mother    Breast cancer Neg Hx    Social History   Socioeconomic History   Marital status: Single    Spouse name: Not on file   Number of children: Not on file   Years of education: Not on file   Highest education level: Not on file  Occupational History   Not on file  Tobacco Use   Smoking status: Never   Smokeless tobacco: Never  Substance and Sexual Activity   Alcohol use: Never   Drug use: Never   Sexual activity: Not Currently  Other Topics Concern   Not on file  Social History Narrative   Patient primary language is spanish, she lives at home with her son Julia Johns and daughter in law Julia Johns    Social Determinants of Health   Financial  Resource Strain: Low Risk  (12/04/2021)   Overall Financial Resource Strain (CARDIA)    Difficulty of Paying Living Expenses: Not hard at all  Food Insecurity: No Food Insecurity (12/04/2021)   Hunger Vital Sign    Worried About Running Out of Food in the Last Year: Never true    Ran Out of Food in the Last Year: Never true  Transportation Needs: No Transportation Needs (12/04/2021)   PRAPARE - Hydrologist (Medical): No    Lack of Transportation (Non-Medical): No   Physical Activity: Inactive (12/04/2021)   Exercise Vital Sign    Days of Exercise per Week: 0 days    Minutes of Exercise per Session: 0 min  Stress: No Stress Concern Present (12/04/2021)   [place]    Feeling of Stress : Not at all  Social Connections: Socially Isolated (12/04/2021)   Social Connection and Isolation Panel [NHANES]    Frequency of Communication with Friends and Family: More than three times a week    Frequency of Social Gatherings with Friends and Family: More than three times a week    Attends Religious Services: Never    Marine scientist or Organizations: No    Attends Archivist Meetings: Never    Marital Status: Widowed    Review of Systems  Constitutional:  Negative for chills, fatigue and fever.  HENT:  Negative for congestion, ear pain, rhinorrhea and sore throat.   Respiratory:  Negative for cough and shortness of breath.   Cardiovascular:  Negative for chest pain.  Gastrointestinal:  Negative for abdominal pain, constipation, diarrhea, nausea and vomiting.  Genitourinary:  Negative for dysuria and urgency.  Musculoskeletal:  Negative for back pain and myalgias.  Neurological:  Negative for dizziness, weakness, light-headedness and headaches.  Psychiatric/Behavioral:  Negative for dysphoric mood. The patient is not nervous/anxious.      Objective:  BP 124/78   Pulse 74   Temp 97.6 F (36.4 C)   Ht '4\' 9"'$  (1.448 m)   Wt 187 lb (84.8 kg)   SpO2 94%   BMI 40.47 kg/m      12/04/2021    7:45 AM 08/27/2021    1:49 PM 05/21/2021    1:51 PM  BP/Weight  Systolic BP 979 892 119  Diastolic BP 78 80 70  Wt. (Lbs) 187 188 189  BMI 40.47 kg/m2 40.68 kg/m2 40.9 kg/m2    Physical Exam Vitals reviewed.  Constitutional:      Appearance: Normal appearance. She is normal weight.  Neck:     Vascular: No carotid bruit.  Cardiovascular:     Rate and Rhythm: Normal rate and  regular rhythm.     Heart sounds: Normal heart sounds.  Pulmonary:     Effort: Pulmonary effort is normal. No respiratory distress.     Breath sounds: Normal breath sounds.  Abdominal:     General: Abdomen is flat. Bowel sounds are normal.     Palpations: Abdomen is soft.     Tenderness: There is no abdominal tenderness.  Neurological:     Mental Status: She is alert and oriented to person, place, and time.  Psychiatric:        Mood and Affect: Mood normal.        Behavior: Behavior normal.     Diabetic Foot Exam - Simple   Simple Foot Form Diabetic Foot exam was performed with the following findings: Yes 12/04/2021  8:19 PM  Visual Inspection No deformities, no ulcerations, no other skin breakdown bilaterally: Yes Sensation Testing Intact to touch and monofilament testing bilaterally: Yes Pulse Check Posterior Tibialis and Dorsalis pulse intact bilaterally: Yes Comments      Lab Results  Component Value Date   WBC 8.7 12/04/2021   HGB 14.3 12/04/2021   HCT 44.4 12/04/2021   PLT 342 12/04/2021   GLUCOSE 97 12/04/2021   CHOL 146 12/04/2021   TRIG 150 (H) 12/04/2021   HDL 60 12/04/2021   LDLCALC 61 12/04/2021   ALT 20 12/04/2021   AST 17 12/04/2021   NA 139 12/04/2021   K 4.1 12/04/2021   CL 104 12/04/2021   CREATININE 0.61 12/04/2021   BUN 12 12/04/2021   CO2 22 12/04/2021   INR 1.2 07/05/2018   HGBA1C 6.7 (H) 12/04/2021   MICROALBUR 30 04/18/2020      Assessment & Plan:   Problem List Items Addressed This Visit       Cardiovascular and Mediastinum   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  Dr. Posey Pronto      Hypertension associated with diabetes ([place]) - Primary    Well controlled.  No changes to medicines. Taking chlorthalidone 25 mg 1/2 tablet daily. Continue to work on eating a healthy diet and exercise.  Labs drawn today.        Relevant Medications   Semaglutide (RYBELSUS) 14 MG TABS   Other Relevant Orders    Comprehensive metabolic panel (Completed)   CBC with Differential/Platelet (Completed)   Cardiovascular Risk Assessment (Completed)     Respiratory   RESOLVED: Allergic rhinitis   Relevant Medications   benzonatate (TESSALON) 100 MG capsule     Endocrine   Diabetic glomerulopathy ([place])    Control:  Fair Recommend check sugars fasting daily. Recommend check feet daily. Recommend annual eye exams.Increase Rybelsus 14 mg daily. Continue to work on eating a healthy diet and exercise.  Labs drawn today.         Relevant Medications   benzonatate (TESSALON) 100 MG capsule   Semaglutide (RYBELSUS) 14 MG TABS   Other Relevant Orders   Hemoglobin A1c (Completed)   Microalbumin / creatinine urine ratio (Completed)     Other   Mixed hyperlipidemia    Well controlled.  No changes to medicines. Rosuvastatin 10 mg daily. Continue to work on eating a healthy diet and exercise.  Labs drawn today.        Relevant Orders   Lipid panel (Completed)   Need for influenza vaccination   Relevant Orders   Flu Vaccine QUAD High Dose(Fluad) (Completed)   Need for COVID-19 vaccine   Relevant Orders   Pfizer Fall 2023 Covid-19 Vaccine 61yr and older (Completed)  .  Meds ordered this encounter  Medications   benzonatate (TESSALON) 100 MG capsule    Sig: Take 1 capsule (100 mg total) by mouth 3 (three) times daily as needed for cough.    Dispense:  60 capsule    Refill:  0   Semaglutide (RYBELSUS) 14 MG TABS    Sig: Take 1 tablet (14 mg total) by mouth daily.    Dispense:  90 tablet    Refill:  0    [place] [facility] Please give 6 months of medicine.    Orders Placed This Encounter  Procedures   Flu Vaccine QUAD High Dose(Fluad)   Pfizer Fall 2023 Covid-19 Vaccine 153yrand older   Comprehensive metabolic  panel   Hemoglobin A1c   Lipid panel   CBC with Differential/Platelet   Microalbumin / creatinine urine ratio   Cardiovascular Risk Assessment      Follow-up: Return in about 4 months (around 04/04/2022) for chronic fasting.  An After Visit Summary was printed and given to the patient.  [name], MD [facility] [phone]

## 2021-12-03 NOTE — Assessment & Plan Note (Signed)
Well controlled.  No changes to medicines. Taking chlorthalidone 25 mg 1/2 tablet daily. Continue to work on eating a healthy diet and exercise.  Labs drawn today.

## 2021-12-04 ENCOUNTER — Ambulatory Visit (INDEPENDENT_AMBULATORY_CARE_PROVIDER_SITE_OTHER): Payer: Medicare HMO | Admitting: Family Medicine

## 2021-12-04 VITALS — BP 124/78 | HR 74 | Temp 97.6°F | Ht <= 58 in | Wt 187.0 lb

## 2021-12-04 DIAGNOSIS — E1159 Type 2 diabetes mellitus with other circulatory complications: Secondary | ICD-10-CM

## 2021-12-04 DIAGNOSIS — J301 Allergic rhinitis due to pollen: Secondary | ICD-10-CM

## 2021-12-04 DIAGNOSIS — E1121 Type 2 diabetes mellitus with diabetic nephropathy: Secondary | ICD-10-CM | POA: Diagnosis not present

## 2021-12-04 DIAGNOSIS — I152 Hypertension secondary to endocrine disorders: Secondary | ICD-10-CM

## 2021-12-04 DIAGNOSIS — Z23 Encounter for immunization: Secondary | ICD-10-CM | POA: Diagnosis not present

## 2021-12-04 DIAGNOSIS — I7782 Antineutrophilic cytoplasmic antibody (ANCA) vasculitis: Secondary | ICD-10-CM | POA: Diagnosis not present

## 2021-12-04 DIAGNOSIS — E782 Mixed hyperlipidemia: Secondary | ICD-10-CM

## 2021-12-04 MED ORDER — RYBELSUS 14 MG PO TABS: 14.0000 mg | ORAL_TABLET | Freq: Every day | ORAL | 0 refills | Status: DC

## 2021-12-04 MED ORDER — BENZONATATE 100 MG PO CAPS: 100.0000 mg | ORAL_CAPSULE | Freq: Three times a day (TID) | ORAL | 0 refills | Status: DC | PRN

## 2021-12-04 NOTE — Patient Instructions (Signed)
Recommend zyrtec 10 mg once daily at night.

## 2021-12-05 ENCOUNTER — Encounter: Payer: Self-pay | Admitting: Family Medicine

## 2021-12-05 DIAGNOSIS — Z23 Encounter for immunization: Secondary | ICD-10-CM | POA: Insufficient documentation

## 2021-12-05 LAB — COMPREHENSIVE METABOLIC PANEL
ALT: 20 IU/L (ref 0–32)
AST: 17 IU/L (ref 0–40)
Albumin/Globulin Ratio: 1.6 (ref 1.2–2.2)
Albumin: 4.2 g/dL (ref 3.8–4.8)
Alkaline Phosphatase: 123 IU/L — ABNORMAL HIGH (ref 44–121)
BUN/Creatinine Ratio: 20 (ref 12–28)
BUN: 12 mg/dL (ref 8–27)
Bilirubin Total: 0.4 mg/dL (ref 0.0–1.2)
CO2: 22 mmol/L (ref 20–29)
Calcium: 9.8 mg/dL (ref 8.7–10.3)
Chloride: 104 mmol/L (ref 96–106)
Creatinine, Ser: 0.61 mg/dL (ref 0.57–1.00)
Globulin, Total: 2.7 g/dL (ref 1.5–4.5)
Glucose: 97 mg/dL (ref 70–99)
Potassium: 4.1 mmol/L (ref 3.5–5.2)
Sodium: 139 mmol/L (ref 134–144)
Total Protein: 6.9 g/dL (ref 6.0–8.5)
eGFR: 96 mL/min/{1.73_m2} (ref >59)

## 2021-12-05 LAB — MICROALBUMIN / CREATININE URINE RATIO
Creatinine, Urine: 13.7 mg/dL
Microalb/Creat Ratio: 123 mg/g creat — ABNORMAL HIGH (ref 0–29)
Microalbumin, Urine: 16.8 ug/mL

## 2021-12-05 LAB — CBC WITH DIFFERENTIAL/PLATELET
Basophils Absolute: 0.1 10*3/uL (ref 0.0–0.2)
Basos: 1 %
EOS (ABSOLUTE): 0.3 10*3/uL (ref 0.0–0.4)
Eos: 4 %
Hematocrit: 44.4 % (ref 34.0–46.6)
Hemoglobin: 14.3 g/dL (ref 11.1–15.9)
Immature Grans (Abs): 0 10*3/uL (ref 0.0–0.1)
Immature Granulocytes: 0 %
Lymphocytes Absolute: 3.7 10*3/uL — ABNORMAL HIGH (ref 0.7–3.1)
Lymphs: 43 %
MCH: 29.1 pg (ref 26.6–33.0)
MCHC: 32.2 g/dL (ref 31.5–35.7)
MCV: 90 fL (ref 79–97)
Monocytes Absolute: 0.4 10*3/uL (ref 0.1–0.9)
Monocytes: 5 %
Neutrophils Absolute: 4.1 10*3/uL (ref 1.4–7.0)
Neutrophils: 47 %
Platelets: 342 10*3/uL (ref 150–450)
RBC: 4.91 x10E6/uL (ref 3.77–5.28)
RDW: 13.8 % (ref 11.7–15.4)
WBC: 8.7 10*3/uL (ref 3.4–10.8)

## 2021-12-05 LAB — LIPID PANEL
Chol/HDL Ratio: 2.4 ratio (ref 0.0–4.4)
Cholesterol, Total: 146 mg/dL (ref 100–199)
HDL: 60 mg/dL (ref >39)
LDL Chol Calc (NIH): 61 mg/dL (ref 0–99)
Triglycerides: 150 mg/dL — ABNORMAL HIGH (ref 0–149)
VLDL Cholesterol Cal: 25 mg/dL (ref 5–40)

## 2021-12-05 LAB — HEMOGLOBIN A1C
Est. average glucose Bld gHb Est-mCnc: 146 mg/dL
Hgb A1c MFr Bld: 6.7 % — ABNORMAL HIGH (ref 4.8–5.6)

## 2021-12-05 LAB — CARDIOVASCULAR RISK ASSESSMENT

## 2021-12-05 NOTE — Progress Notes (Signed)
Blood count normal.  Liver function normal.  Kidney function normal.  Cholesterol: good HBA1C: 6.7. improved. Spilling protein in urine. Worsened. Recommend increase lisinopril 10 mg daily.

## 2021-12-05 NOTE — Assessment & Plan Note (Signed)
Management per specialist.  Dr. Posey Pronto

## 2021-12-06 ENCOUNTER — Other Ambulatory Visit: Payer: Self-pay

## 2021-12-06 DIAGNOSIS — E1169 Type 2 diabetes mellitus with other specified complication: Secondary | ICD-10-CM

## 2021-12-06 DIAGNOSIS — E1159 Type 2 diabetes mellitus with other circulatory complications: Secondary | ICD-10-CM

## 2021-12-06 MED ORDER — METFORMIN HCL 500 MG PO TABS: ORAL_TABLET | ORAL | 1 refills | Status: DC

## 2021-12-06 MED ORDER — LISINOPRIL 10 MG PO TABS: 10.0000 mg | ORAL_TABLET | Freq: Every day | ORAL | 1 refills | Status: DC

## 2021-12-06 MED ORDER — CHLORTHALIDONE 25 MG PO TABS: 12.5000 mg | ORAL_TABLET | Freq: Every day | ORAL | 1 refills | Status: DC

## 2021-12-06 MED ORDER — ROSUVASTATIN CALCIUM 10 MG PO TABS: 10.0000 mg | ORAL_TABLET | Freq: Every day | ORAL | 1 refills | Status: DC

## 2021-12-08 ENCOUNTER — Other Ambulatory Visit: Payer: Self-pay | Admitting: Family Medicine

## 2021-12-08 DIAGNOSIS — E1169 Type 2 diabetes mellitus with other specified complication: Secondary | ICD-10-CM

## 2022-04-01 ENCOUNTER — Other Ambulatory Visit: Payer: Self-pay

## 2022-04-01 DIAGNOSIS — E1121 Type 2 diabetes mellitus with diabetic nephropathy: Secondary | ICD-10-CM

## 2022-04-01 DIAGNOSIS — E1169 Type 2 diabetes mellitus with other specified complication: Secondary | ICD-10-CM

## 2022-04-01 MED ORDER — METFORMIN HCL 500 MG PO TABS: ORAL_TABLET | ORAL | 1 refills | Status: DC

## 2022-04-01 MED ORDER — ROSUVASTATIN CALCIUM 10 MG PO TABS: 10.0000 mg | ORAL_TABLET | Freq: Every day | ORAL | 1 refills | Status: DC

## 2022-04-01 MED ORDER — RYBELSUS 14 MG PO TABS: 14.0000 mg | ORAL_TABLET | Freq: Every day | ORAL | 1 refills | Status: DC

## 2022-04-08 NOTE — Assessment & Plan Note (Signed)
Well controlled.  No changes to medicines. Taking chlorthalidone 25 mg 1/2 tablet daily. Continue to work on eating a healthy diet and exercise.  Labs drawn today.   

## 2022-04-08 NOTE — Progress Notes (Unsigned)
t  Subjective:  Patient ID: Julia Johns, female    DOB: 1950-06-23  Age: 72 y.o. MRN: [id number]  Chief Complaint  Patient presents with   Diabetes   Hypertension   Hyperlipidemia    HPI Diabetes:  Complications: Hyperlipidemia, Hypertension. Glucose  checking: every day Glucose logs: 111-125 mg/dl Hypoglycemia none Most recent A1C: 6.7 Current medications: Metformin 500 mg daily, Rybelsus 14 mg daily. Last Eye Exam: 04/25/2020. Needs appt. Son plans to take her to the eye doctor,  Foot checks: no check her feet.  Hyperlipidemia: Current medications: Rosuvastatin 10 mg daily.  Hypertension: Complications:Diabetes, Hyperlipidemia. Current medications: Chlorthalidone 25 mg 1/2 tablet daily.   Allergic rhinitis: zyrtec 10 mg before bed.       04/09/2022    8:09 AM 12/04/2021    7:46 AM 01/23/2021   10:13 AM 04/18/2020    8:02 AM 01/19/2020   11:00 AM  Depression screen PHQ 2/9  Decreased Interest 0 0 0 0 0  Down, Depressed, Hopeless 0 0 0 0 1  PHQ - 2 Score 0 0 0 0 1  Altered sleeping  0     Tired, decreased energy  0     Change in appetite  0     Feeling bad or failure about yourself   0     Trouble concentrating  0     Moving slowly or fidgety/restless  0     Suicidal thoughts  0     PHQ-9 Score  0     Difficult doing work/chores  Not difficult at all            01/19/2020   11:00 AM 04/18/2020    8:02 AM 01/23/2021   10:13 AM 12/04/2021    7:46 AM 04/09/2022    8:09 AM  Fall Risk  Falls in the past year? 0 0 0 0 0  Was there an injury with Fall? 0 0 0 0 0  Fall Risk Category Calculator 0 0 0 0 0  Fall Risk Category (Retired) Low Low Low Low   (RETIRED) Patient Fall Risk Level Low fall risk Low fall risk Low fall risk Low fall risk   Patient at Risk for Falls Due to  No Fall Risks No Fall Risks No Fall Risks No Fall Risks  Fall risk Follow up  Falls evaluation completed Falls evaluation completed;Falls prevention discussed Falls evaluation completed Falls evaluation completed      Review of Systems  Constitutional:  Negative for chills, fatigue and fever.  HENT:  Negative for congestion, ear pain, rhinorrhea and sore throat.   Respiratory:  Negative for cough and shortness of breath.   Cardiovascular:  Negative  for chest pain.  Gastrointestinal:  Negative for abdominal pain, constipation, diarrhea, nausea and vomiting.  Genitourinary:  Negative for dysuria and urgency.  Musculoskeletal:  Negative for back pain and myalgias.  Neurological:  Negative for dizziness, weakness, light-headedness and headaches.  Psychiatric/Behavioral:  Negative for dysphoric mood. The patient is not nervous/anxious.     Current Outpatient Medications on File Prior to Visit  Medication Sig Dispense Refill   Accu-Chek Softclix Lancets lancets USE TO CHECK BLOOD SUGAR TWICE A DAY E11.69 100 each 2   Accu-Chek Softclix Lancets lancets Use as instructed 100 each 12   chlorthalidone (HYGROTON) 25 MG tablet Take 0.5 tablets (12.5 mg total) by mouth daily. 45 tablet 1   glucose blood (ACCU-CHEK AVIVA PLUS) test strip Use as instructed 100 each 12   lisinopril (ZESTRIL)  10 MG tablet Take 1 tablet (10 mg total) by mouth daily. 90 tablet 1   metFORMIN (GLUCOPHAGE) 500 MG tablet TAKE 1 TABLET BY MOUTH ONCE DAILY AFTER  THE  BIGGEST  MEAL 90 tablet 1   rosuvastatin (CRESTOR) 10 MG tablet Take 1 tablet (10 mg total) by mouth daily. 90 tablet 1   Semaglutide (RYBELSUS) 14 MG TABS Take 1 tablet (14 mg total) by mouth daily. 90 tablet 1   No current facility-administered medications on file prior to visit.   Past Medical History:  Diagnosis Date   Chronic kidney disease    Chronic respiratory failure with hypoxia (HCC)    10/09/2018-overnight oximetry- duration of sleep 7 hours and 34 minutes, on room air, SPO2 less than 88% 1 hour and 34 minutes and 28 seconds.    Diabetes mellitus without complication ([place])    Grade II hemorrhoids 07/03/2017   Last Assessment & Plan:  Formatting of this note might be different from the original. Fiber supplement stool softener adequate hydration and local hemorrhoid management is advisable.   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[place])    Past Surgical History:  Procedure Laterality Date    VIDEO BRONCHOSCOPY Bilateral 06/18/2018   Procedure: VIDEO BRONCHOSCOPY WITH FLUORO;  Surgeon: Rigoberto Noel, MD;  Location: [place];  Service: Cardiopulmonary;  Laterality: Bilateral;   VIDEO BRONCHOSCOPY WITH ENDOBRONCHIAL ULTRASOUND N/A 06/25/2018   Procedure: VIDEO BRONCHOSCOPY WITH ENDOBRONCHIAL ULTRASOUND;  Surgeon: Rigoberto Noel, MD;  Location: [facility];  Service: Thoracic;  Laterality: N/A;    Family History  Problem Relation Age of Onset   Uterine cancer Mother    Breast cancer Neg Hx    Social History   Socioeconomic History   Marital status: Single    Spouse name: Not on file   Number of children: Not on file   Years of education: Not on file   Highest education level: Not on file  Occupational History   Not on file  Tobacco Use   Smoking status: Never   Smokeless tobacco: Never  Substance and Sexual Activity   Alcohol use: Never   Drug use: Never   Sexual activity: Not Currently  Other Topics Concern   Not on file  Social History Narrative   Patient primary language is spanish, she lives at home with her son [name] and daughter in law [name]    Social Determinants of Health   Financial Resource Strain: Low Risk  (12/04/2021)   Overall Financial Resource Strain (CARDIA)    Difficulty of Paying Living Expenses: Not hard at all  Food Insecurity: No Food Insecurity (12/04/2021)   Hunger Vital Sign    Worried About Running Out of Food in the Last Year: Never true    Ran Out of Food in the Last Year: Never true  Transportation Needs: No Transportation Needs (12/04/2021)   PRAPARE - Hydrologist (Medical): No    Lack of Transportation (Non-Medical): No  Physical Activity: Inactive (12/04/2021)   Exercise Vital Sign    Days of Exercise per Week: 0 days    Minutes of Exercise per Session: 0 min  Stress: No Stress Concern Present (12/04/2021)   [place]    Feeling of Stress : Not at all  Social Connections: Socially Isolated (12/04/2021)   Social Connection and Isolation Panel [NHANES]    Frequency of Communication with Friends and Family: More than  three times a week    Frequency of Social Gatherings with Friends and Family: More than three times a week    Attends Religious Services: Never    Marine scientist or Organizations: No    Attends Archivist Meetings: Never    Marital Status: Widowed    Objective:  BP 128/74   Pulse 86   Temp (!) 97.2 F (36.2 C)   Ht 4\' 9"  (1.448 m)   Wt 180 lb (81.6 kg)   SpO2 98%   BMI 38.95 kg/m      04/09/2022    8:01 AM 12/04/2021    7:45 AM 08/27/2021    1:49 PM  BP/Weight  Systolic BP 0000000 [id number]  Diastolic BP 74 78 80  Wt. (Lbs) 180 187 188  BMI 38.95 kg/m2 40.47 kg/m2 40.68 kg/m2    Physical Exam Vitals reviewed.  Constitutional:      Appearance: Normal appearance. She is normal weight.  Neck:     Vascular: No carotid bruit.  Cardiovascular:     Rate and Rhythm: Normal rate and regular rhythm.     Heart sounds: Normal heart sounds.  Pulmonary:     Effort: Pulmonary effort is normal. No respiratory distress.     Breath sounds: Normal breath sounds.  Abdominal:     General: Abdomen is flat. Bowel sounds are normal.     Palpations: Abdomen is soft.     Tenderness: There is no abdominal tenderness.  Neurological:     Mental Status: She is alert and oriented to person, place, and time.  Psychiatric:        Mood and Affect: Mood normal.        Behavior: Behavior normal.     Diabetic Foot Exam - Simple   Simple Foot Form Diabetic Foot exam was performed with the following findings: Yes 04/09/2022  8:35 AM  Visual Inspection See comments: Yes Sensation Testing Intact to touch and monofilament testing bilaterally: Yes Pulse Check Posterior Tibialis and Dorsalis pulse intact bilaterally: Yes Comments [place] BL.       Lab Results   Component Value Date   WBC 7.9 04/09/2022   HGB 14.4 04/09/2022   HCT 44.4 04/09/2022   PLT 366 04/09/2022   GLUCOSE 89 04/09/2022   CHOL 182 04/09/2022   TRIG 145 04/09/2022   HDL 60 04/09/2022   LDLCALC 97 04/09/2022   ALT 36 (H) 04/09/2022   AST 32 04/09/2022   NA 140 04/09/2022   K 4.9 04/09/2022   CL 104 04/09/2022   CREATININE 0.58 04/09/2022   BUN 15 04/09/2022   CO2 20 04/09/2022   INR 1.2 07/05/2018   HGBA1C 6.5 (H) 04/09/2022   MICROALBUR 30 04/18/2020      Assessment & Plan:    Hypertension associated with diabetes ([place]) Assessment & Plan: Well controlled.  No changes to medicines. Taking chlorthalidone 25 mg 1/2 tablet daily. Continue to work on eating a healthy diet and exercise.  Labs drawn today.    Orders: -     Comprehensive metabolic panel -     CBC with Differential/Platelet  Diabetic glomerulopathy ([place]) Assessment & Plan: Control:  Fair Recommend check sugars fasting daily. Recommend check feet daily. Recommend annual eye exams. Continue Rybelsus 14 mg daily, and metformin 500 mg daily Continue to work on eating a healthy diet and exercise.  Labs drawn today.     Orders: -     Microalbumin / creatinine urine ratio -  Hemoglobin A1c -     Accu-Chek Aviva Plus; Check as directed.  Dispense: 100 each; Refill: 3 -     Cardiovascular Risk Assessment  Mixed hyperlipidemia Assessment & Plan: Well controlled.  No changes to medicines. Rosuvastatin 10 mg daily. Continue to work on eating a healthy diet and exercise.  Labs drawn today.    Orders: -     Lipid panel  Need for vaccination -     Pneumococcal conjugate vaccine 20-valent  Seasonal allergic rhinitis due to pollen Assessment & Plan: Start zyrtec.  Orders: -     Cetirizine HCl; Take 1 tablet (10 mg total) by mouth daily.  Dispense: 90 tablet; Refill: 3    Follow-up: Return in about 3 months (around 07/10/2022) for chronic fasting.   I,Marla I Leal-Borjas,acting as a  scribe for [name], MD.,have documented all relevant documentation on the behalf of [name], MD,as directed by  [name], MD while in the presence of [name], MD.   An After Visit Summary was printed and given to the patient.  [name], MD [facility] [phone]

## 2022-04-08 NOTE — Assessment & Plan Note (Signed)
Control:  Fair Recommend check sugars fasting daily. Recommend check feet daily. Recommend annual eye exams. Continue Rybelsus 14 mg daily, and metformin 500 mg daily Continue to work on eating a healthy diet and exercise.  Labs drawn today.

## 2022-04-08 NOTE — Assessment & Plan Note (Signed)
Well controlled.  No changes to medicines. Rosuvastatin 10 mg daily. Continue to work on eating a healthy diet and exercise.  Labs drawn today.  

## 2022-04-09 ENCOUNTER — Encounter: Payer: Self-pay | Admitting: Family Medicine

## 2022-04-09 ENCOUNTER — Ambulatory Visit (INDEPENDENT_AMBULATORY_CARE_PROVIDER_SITE_OTHER): Payer: Medicare HMO | Admitting: Family Medicine

## 2022-04-09 VITALS — BP 128/74 | HR 86 | Temp 97.2°F | Ht <= 58 in | Wt 180.0 lb

## 2022-04-09 DIAGNOSIS — Z23 Encounter for immunization: Secondary | ICD-10-CM | POA: Diagnosis not present

## 2022-04-09 DIAGNOSIS — J301 Allergic rhinitis due to pollen: Secondary | ICD-10-CM | POA: Diagnosis not present

## 2022-04-09 DIAGNOSIS — E1159 Type 2 diabetes mellitus with other circulatory complications: Secondary | ICD-10-CM

## 2022-04-09 DIAGNOSIS — I152 Hypertension secondary to endocrine disorders: Secondary | ICD-10-CM | POA: Diagnosis not present

## 2022-04-09 DIAGNOSIS — E1121 Type 2 diabetes mellitus with diabetic nephropathy: Secondary | ICD-10-CM

## 2022-04-09 DIAGNOSIS — E782 Mixed hyperlipidemia: Secondary | ICD-10-CM

## 2022-04-09 MED ORDER — CETIRIZINE HCL 10 MG PO TABS: 10.0000 mg | ORAL_TABLET | Freq: Every day | ORAL | 3 refills | Status: DC

## 2022-04-09 MED ORDER — ACCU-CHEK AVIVA PLUS VI STRP: ORAL_STRIP | 3 refills | Status: DC

## 2022-04-10 LAB — COMPREHENSIVE METABOLIC PANEL
ALT: 36 IU/L — ABNORMAL HIGH (ref 0–32)
AST: 32 IU/L (ref 0–40)
Albumin/Globulin Ratio: 1.4 (ref 1.2–2.2)
Albumin: 4 g/dL (ref 3.8–4.8)
Alkaline Phosphatase: 111 IU/L (ref 44–121)
BUN/Creatinine Ratio: 26 (ref 12–28)
BUN: 15 mg/dL (ref 8–27)
Bilirubin Total: 0.4 mg/dL (ref 0.0–1.2)
CO2: 20 mmol/L (ref 20–29)
Calcium: 9.9 mg/dL (ref 8.7–10.3)
Chloride: 104 mmol/L (ref 96–106)
Creatinine, Ser: 0.58 mg/dL (ref 0.57–1.00)
Globulin, Total: 2.8 g/dL (ref 1.5–4.5)
Glucose: 89 mg/dL (ref 70–99)
Potassium: 4.9 mmol/L (ref 3.5–5.2)
Sodium: 140 mmol/L (ref 134–144)
Total Protein: 6.8 g/dL (ref 6.0–8.5)
eGFR: 97 mL/min/{1.73_m2} (ref >59)

## 2022-04-10 LAB — LIPID PANEL
Chol/HDL Ratio: 3 ratio (ref 0.0–4.4)
Cholesterol, Total: 182 mg/dL (ref 100–199)
HDL: 60 mg/dL (ref >39)
LDL Chol Calc (NIH): 97 mg/dL (ref 0–99)
Triglycerides: 145 mg/dL (ref 0–149)
VLDL Cholesterol Cal: 25 mg/dL (ref 5–40)

## 2022-04-10 LAB — CBC WITH DIFFERENTIAL/PLATELET
Basophils Absolute: 0.1 10*3/uL (ref 0.0–0.2)
Basos: 1 %
EOS (ABSOLUTE): 0.4 10*3/uL (ref 0.0–0.4)
Eos: 4 %
Hematocrit: 44.4 % (ref 34.0–46.6)
Hemoglobin: 14.4 g/dL (ref 11.1–15.9)
Immature Grans (Abs): 0 10*3/uL (ref 0.0–0.1)
Immature Granulocytes: 0 %
Lymphocytes Absolute: 3 10*3/uL (ref 0.7–3.1)
Lymphs: 38 %
MCH: 28.9 pg (ref 26.6–33.0)
MCHC: 32.4 g/dL (ref 31.5–35.7)
MCV: 89 fL (ref 79–97)
Monocytes Absolute: 0.4 10*3/uL (ref 0.1–0.9)
Monocytes: 6 %
Neutrophils Absolute: 4 10*3/uL (ref 1.4–7.0)
Neutrophils: 51 %
Platelets: 366 10*3/uL (ref 150–450)
RBC: 4.99 x10E6/uL (ref 3.77–5.28)
RDW: 13.2 % (ref 11.7–15.4)
WBC: 7.9 10*3/uL (ref 3.4–10.8)

## 2022-04-10 LAB — HEMOGLOBIN A1C
Est. average glucose Bld gHb Est-mCnc: 140 mg/dL
Hgb A1c MFr Bld: 6.5 % — ABNORMAL HIGH (ref 4.8–5.6)

## 2022-04-10 LAB — MICROALBUMIN / CREATININE URINE RATIO
Creatinine, Urine: 26.4 mg/dL
Microalb/Creat Ratio: 94 mg/g creat — ABNORMAL HIGH (ref 0–29)
Microalbumin, Urine: 24.8 ug/mL

## 2022-04-10 LAB — CARDIOVASCULAR RISK ASSESSMENT

## 2022-04-14 DIAGNOSIS — J301 Allergic rhinitis due to pollen: Secondary | ICD-10-CM | POA: Insufficient documentation

## 2022-04-14 DIAGNOSIS — Z23 Encounter for immunization: Secondary | ICD-10-CM | POA: Insufficient documentation

## 2022-04-14 NOTE — Assessment & Plan Note (Signed)
Start zyrtec 

## 2022-04-29 DIAGNOSIS — Z961 Presence of intraocular lens: Secondary | ICD-10-CM | POA: Diagnosis not present

## 2022-04-29 DIAGNOSIS — H26491 Other secondary cataract, right eye: Secondary | ICD-10-CM | POA: Diagnosis not present

## 2022-04-29 DIAGNOSIS — H40033 Anatomical narrow angle, bilateral: Secondary | ICD-10-CM | POA: Diagnosis not present

## 2022-05-08 DIAGNOSIS — Z961 Presence of intraocular lens: Secondary | ICD-10-CM | POA: Diagnosis not present

## 2022-05-08 DIAGNOSIS — H26492 Other secondary cataract, left eye: Secondary | ICD-10-CM | POA: Diagnosis not present

## 2022-05-09 DIAGNOSIS — D631 Anemia in chronic kidney disease: Secondary | ICD-10-CM | POA: Diagnosis not present

## 2022-05-09 DIAGNOSIS — E559 Vitamin D deficiency, unspecified: Secondary | ICD-10-CM | POA: Diagnosis not present

## 2022-05-09 DIAGNOSIS — I10 Essential (primary) hypertension: Secondary | ICD-10-CM | POA: Diagnosis not present

## 2022-05-09 DIAGNOSIS — N189 Chronic kidney disease, unspecified: Secondary | ICD-10-CM | POA: Diagnosis not present

## 2022-05-09 DIAGNOSIS — I7782 Antineutrophilic cytoplasmic antibody (ANCA) vasculitis: Secondary | ICD-10-CM | POA: Diagnosis not present

## 2022-05-10 LAB — LAB REPORT - SCANNED
Creatinine, POC: 13.1 mg/dL
EGFR: 94

## 2022-07-29 NOTE — Assessment & Plan Note (Signed)
Well controlled.  No changes to medicines. Rosuvastatin 10 mg daily. Continue to work on eating a healthy diet and exercise.  Labs drawn today.  

## 2022-07-29 NOTE — Assessment & Plan Note (Signed)
Control:   Recommend check sugars fasting daily. Recommend check feet daily. Recommend annual eye exams. Continue Rybelsus 14 mg daily, and metformin 500 mg daily Continue to work on eating a healthy diet and exercise.  Labs drawn today.

## 2022-07-29 NOTE — Progress Notes (Unsigned)
Subjective:  Patient ID: Julia Johns, female    DOB: January 15, 1951  Age: 72 y.o. MRN: [id number]  Chief Complaint  Patient presents with   Medical Management of Chronic Issues    HPI  Diabetes:  Complications: Hyperlipidemia, Hypertension. Glucose checking: every day Glucose logs: 111-125 mg/dl Hypoglycemia none Most recent A1C: 6.5 Current medications: Metformin 500 mg daily, Rybelsus 14 mg daily. Last Eye Exam: 04/25/2020. Needs appt. Son plans to take her to the eye doctor,  Foot checks: no check her feet.  Hyperlipidemia: Current medications: Rosuvastatin 10 mg daily.  Hypertension: Complications:Diabetes, Hyperlipidemia. Current medications: Chlorthalidone 25 mg 1/2 tablet daily.   Allergic rhinitis: zyrtec 10 mg before bed.      07/30/2022    7:50 AM 04/09/2022    8:09 AM 12/04/2021    7:46 AM 01/23/2021   10:13 AM 04/18/2020    8:02 AM  Depression screen PHQ 2/9  Decreased Interest 0 0 0 0 0  Down, Depressed, Hopeless 0 0 0 0 0  PHQ - 2 Score 0 0 0 0 0  Altered sleeping   0    Tired, decreased energy   0    Change in appetite   0    Feeling bad or failure about yourself    0    Trouble concentrating   0    Moving slowly or fidgety/restless   0    Suicidal thoughts   0    PHQ-9 Score   0    Difficult doing work/chores   Not difficult at all          07/30/2022    8:27 AM  Fall Risk   Falls in the past year? 0  Number falls in past yr: 0  Injury with Fall? 0  Risk for fall due to : No Fall Risks  Follow up Falls prevention discussed    Patient Care Team: Blane Ohara, MD as PCP - General (Family Medicine) Concepcion Living, MD as Referring Physician (Internal Medicine)   Review of Systems  Constitutional:  Negative for fatigue.  HENT:  Negative for congestion, ear pain and sore throat.   Respiratory:  Negative for cough and shortness of breath.   Cardiovascular:  Negative for chest pain.  Gastrointestinal:  Negative for abdominal pain,  constipation, diarrhea, nausea and vomiting.  Genitourinary:  Negative for dysuria, frequency and urgency.  Musculoskeletal:  Negative for arthralgias, back pain and myalgias.  Neurological:  Negative for dizziness and headaches.  Psychiatric/Behavioral:  Negative for agitation and sleep disturbance. The patient is not nervous/anxious.     Current Outpatient Medications on File Prior to Visit  Medication Sig Dispense Refill   Accu-Chek Softclix Lancets lancets Use as instructed 100 each 12   glucose blood (ACCU-CHEK AVIVA PLUS) test strip Use as instructed 100 each 12   No current facility-administered medications on file prior to visit.   Past Medical History:  Diagnosis Date   Chronic kidney disease    Chronic respiratory failure with hypoxia (HCC)    10/09/2018-overnight oximetry- duration of sleep 7 hours and 34 minutes, on room air, SPO2 less than 88% 1 hour and 34 minutes and 28 seconds.    Diabetes mellitus without complication (HCC)    Grade II hemorrhoids 07/03/2017   Last Assessment & Plan:  Formatting of this note might be different from the original. Fiber supplement stool softener adequate hydration and local hemorrhoid management is advisable.   Vasculitis (HCC)    Vasculitis, ANCA positive (HCC)  Past Surgical History:  Procedure Laterality Date   VIDEO BRONCHOSCOPY Bilateral 06/18/2018   Procedure: VIDEO BRONCHOSCOPY WITH FLUORO;  Surgeon: Oretha Milch, MD;  Location: [facility] ENDOSCOPY;  Service: Cardiopulmonary;  Laterality: Bilateral;   VIDEO BRONCHOSCOPY WITH ENDOBRONCHIAL ULTRASOUND N/A 06/25/2018   Procedure: VIDEO BRONCHOSCOPY WITH ENDOBRONCHIAL ULTRASOUND;  Surgeon: Oretha Milch, MD;  Location: [facility] OR;  Service: Thoracic;  Laterality: N/A;    Family History  Problem Relation Age of Onset   Uterine cancer Mother    Breast cancer Neg Hx    Social History   Socioeconomic History   Marital status: Single    Spouse name: Not on file   Number of children: Not on  file   Years of education: Not on file   Highest education level: Not on file  Occupational History   Not on file  Tobacco Use   Smoking status: Never   Smokeless tobacco: Never  Substance and Sexual Activity   Alcohol use: Not on file   Drug use: Never   Sexual activity: Not Currently  Other Topics Concern   Not on file  Social History Narrative   Patient primary language is spanish, she lives at home with her son Julia Johns and daughter in law Julia Johns    Social Determinants of Health   Financial Resource Strain: Low Risk  (07/30/2022)   Overall Financial Resource Strain (CARDIA)    Difficulty of Paying Living Expenses: Not hard at all  Food Insecurity: No Food Insecurity (07/30/2022)   Hunger Vital Sign    Worried About Running Out of Food in the Last Year: Never true    Ran Out of Food in the Last Year: Never true  Transportation Needs: No Transportation Needs (07/30/2022)   PRAPARE - Administrator, Civil Service (Medical): No    Lack of Transportation (Non-Medical): No  Physical Activity: Inactive (07/30/2022)   Exercise Vital Sign    Days of Exercise per Week: 0 days    Minutes of Exercise per Session: 0 min  Stress: No Stress Concern Present (07/30/2022)   [employer] of Occupational Health - Occupational Stress Questionnaire    Feeling of Stress : Not at all  Social Connections: Moderately Isolated (07/30/2022)   Social Connection and Isolation Panel [NHANES]    Frequency of Communication with Friends and Family: Never    Frequency of Social Gatherings with Friends and Family: More than three times a week    Attends Religious Services: Never    Database administrator or Organizations: Yes    Attends Banker Meetings: Never    Marital Status: Widowed    Objective:  BP 110/70 (BP Location: Left Arm, Patient Position: Sitting, Cuff Size: Large)   Pulse 75   Temp (!) 97 F (36.1 C) (Temporal)   Ht 4\' 9"  (1.448 m)   Wt 188 lb 12.8 oz  (85.6 kg)   SpO2 95%   BMI 40.86 kg/m      07/30/2022    8:17 AM 07/30/2022    7:43 AM 04/09/2022    8:01 AM  BP/Weight  Systolic BP 110 110 128  Diastolic BP 70 70 74  Wt. (Lbs) 188 188.8 180  BMI 40.68 kg/m2 40.86 kg/m2 38.95 kg/m2    Physical Exam Vitals reviewed.  Constitutional:      Appearance: Normal appearance. She is obese.  Neck:     Vascular: No carotid bruit.  Cardiovascular:     Rate and Rhythm: Normal  rate and regular rhythm.     Pulses: Normal pulses.     Heart sounds: Normal heart sounds.  Pulmonary:     Effort: Pulmonary effort is normal.     Breath sounds: Normal breath sounds.  Chest:     Chest wall: No tenderness.  Abdominal:     General: Abdomen is flat. Bowel sounds are normal.     Palpations: Abdomen is soft.     Tenderness: There is no abdominal tenderness.  Musculoskeletal:        General: Normal range of motion.     Cervical back: Normal range of motion.  Neurological:     Mental Status: She is alert and oriented to person, place, and time.  Psychiatric:        Mood and Affect: Mood normal.        Behavior: Behavior normal.     Diabetic Foot Exam - Simple   No data filed      Lab Results  Component Value Date   WBC 8.4 07/30/2022   HGB 14.5 07/30/2022   HCT 43.9 07/30/2022   PLT 325 07/30/2022   GLUCOSE 99 07/30/2022   CHOL 146 07/30/2022   TRIG 123 07/30/2022   HDL 65 07/30/2022   LDLCALC 60 07/30/2022   ALT 16 07/30/2022   AST 17 07/30/2022   NA 141 07/30/2022   K 4.7 07/30/2022   CL 103 07/30/2022   CREATININE 0.56 (L) 07/30/2022   BUN 12 07/30/2022   CO2 20 07/30/2022   INR 1.2 07/05/2018   HGBA1C 6.6 (H) 07/30/2022   MICROALBUR 30 04/18/2020      Assessment & Plan:    Hypertension associated with diabetes (HCC) Assessment & Plan: Htn Well controlled.  No changes to medicines. Taking chlorthalidone 25 mg 1/2 tablet daily. Continue to work on eating a healthy diet and exercise.  Labs drawn today.   Control:  good Recommend check sugars fasting daily. Recommend check feet daily. Recommend annual eye exams. Medicines: Metformin 500 mg daily, Rybelsus 14 mg daily. Continue to work on eating a healthy diet and exercise.  Labs drawn today.      Orders: -     CBC with Differential/Platelet -     Comprehensive metabolic panel -     Chlorthalidone; Take 0.5 tablets (12.5 mg total) by mouth daily.  Dispense: 45 tablet; Refill: 1 -     metFORMIN HCl; TAKE 1 TABLET BY MOUTH ONCE DAILY AFTER  THE  BIGGEST  MEAL  Dispense: 90 tablet; Refill: 1 -     Microalbumin / creatinine urine ratio  Diabetic glomerulopathy (HCC) Assessment & Plan: Control:   Recommend check sugars fasting daily. Recommend check feet daily. Recommend annual eye exams. Continue Rybelsus 14 mg daily, and metformin 500 mg daily Continue to work on eating a healthy diet and exercise.  Labs drawn today.     Orders: -     Hemoglobin A1c -     Accu-Chek Aviva Plus; Check as directed.  Dispense: 100 each; Refill: 3 -     Rybelsus; Take 1 tablet (14 mg total) by mouth daily.  Dispense: 90 tablet; Refill: 1  Mixed hyperlipidemia Assessment & Plan: Well controlled.  No changes to medicines. Rosuvastatin 10 mg daily. Continue to work on eating a healthy diet and exercise.  Labs drawn today.    Orders: -     Lipid panel -     Rosuvastatin Calcium; Take 1 tablet (10 mg total) by mouth daily.  Dispense: 90 tablet; Refill: 1  Seasonal allergic rhinitis due to pollen -     Cetirizine HCl; Take 1 tablet (10 mg total) by mouth daily.  Dispense: 90 tablet; Refill: 3  ANCA-associated vasculitis (HCC) Assessment & Plan: Management per specialist.  Dr. Allena Katz   Morbid obesity with BMI of 40.0-44.9, adult [facility]) Assessment & Plan: Recommend continue to work on eating healthy diet and exercise.    Memory loss -     B12 and Folate Panel -     Methylmalonic acid, serum -     TSH  Other orders -     Accu-Chek Softclix Lancets;  USE TO CHECK BLOOD SUGAR TWICE A DAY E11.69  Dispense: 100 each; Refill: 2 -     Accu-Chek Aviva Plus; Check Blood Sugar fasting every morning.  Dispense: 1 kit; Refill: 0 -     Lisinopril; Take 1 tablet (10 mg total) by mouth daily.  Dispense: 90 tablet; Refill: 1     Meds ordered this encounter  Medications   Accu-Chek Softclix Lancets lancets    Sig: USE TO CHECK BLOOD SUGAR TWICE A DAY E11.69    Dispense:  100 each    Refill:  2   ACCU-CHEK AVIVA PLUS test strip    Sig: Check as directed.    Dispense:  100 each    Refill:  3   cetirizine (ZYRTEC) 10 MG tablet    Sig: Take 1 tablet (10 mg total) by mouth daily.    Dispense:  90 tablet    Refill:  3   chlorthalidone (HYGROTON) 25 MG tablet    Sig: Take 0.5 tablets (12.5 mg total) by mouth daily.    Dispense:  45 tablet    Refill:  1   metFORMIN (GLUCOPHAGE) 500 MG tablet    Sig: TAKE 1 TABLET BY MOUTH ONCE DAILY AFTER  THE  BIGGEST  MEAL    Dispense:  90 tablet    Refill:  1   rosuvastatin (CRESTOR) 10 MG tablet    Sig: Take 1 tablet (10 mg total) by mouth daily.    Dispense:  90 tablet    Refill:  1   Semaglutide (RYBELSUS) 14 MG TABS    Sig: Take 1 tablet (14 mg total) by mouth daily.    Dispense:  90 tablet    Refill:  1    [place] going to [place] for 6 months. Please give 6 months of medicine.   Blood Glucose Monitoring Suppl (ACCU-CHEK AVIVA PLUS) w/Device KIT    Sig: Check Blood Sugar fasting every morning.    Dispense:  1 kit    Refill:  0   lisinopril (ZESTRIL) 10 MG tablet    Sig: Take 1 tablet (10 mg total) by mouth daily.    Dispense:  90 tablet    Refill:  1    Orders Placed This Encounter  Procedures   CBC with Differential/Platelet   Comprehensive metabolic panel   Hemoglobin A1c   Lipid panel   Microalbumin / creatinine urine ratio   B12 and Folate Panel   Methylmalonic acid, serum   TSH     Follow-up: Return in about 3 months (around 10/30/2022).   I,Marla I Leal-Borjas,acting as a  scribe for Blane Ohara, MD.,have documented all relevant documentation on the behalf of Blane Ohara, MD,as directed by  Blane Ohara, MD while in the presence of Blane Ohara, MD.   An After Visit Summary was printed and given to the patient.  Blane Ohara, MD [facility] [phone]

## 2022-07-29 NOTE — Assessment & Plan Note (Signed)
Well controlled.  No changes to medicines. Taking chlorthalidone 25 mg 1/2 tablet daily. Continue to work on eating a healthy diet and exercise.  Labs drawn today.   

## 2022-07-30 ENCOUNTER — Encounter: Payer: Self-pay | Admitting: Family Medicine

## 2022-07-30 ENCOUNTER — Ambulatory Visit: Payer: Medicare HMO

## 2022-07-30 ENCOUNTER — Ambulatory Visit (INDEPENDENT_AMBULATORY_CARE_PROVIDER_SITE_OTHER): Payer: Medicare HMO | Admitting: Family Medicine

## 2022-07-30 VITALS — BP 110/70 | HR 75 | Temp 97.0°F | Resp 12 | Ht <= 58 in | Wt 188.0 lb

## 2022-07-30 VITALS — BP 110/70 | HR 75 | Temp 97.0°F | Ht <= 58 in | Wt 188.8 lb

## 2022-07-30 DIAGNOSIS — I152 Hypertension secondary to endocrine disorders: Secondary | ICD-10-CM | POA: Diagnosis not present

## 2022-07-30 DIAGNOSIS — R413 Other amnesia: Secondary | ICD-10-CM | POA: Diagnosis not present

## 2022-07-30 DIAGNOSIS — E1121 Type 2 diabetes mellitus with diabetic nephropathy: Secondary | ICD-10-CM

## 2022-07-30 DIAGNOSIS — Z0001 Encounter for general adult medical examination with abnormal findings: Secondary | ICD-10-CM | POA: Diagnosis not present

## 2022-07-30 DIAGNOSIS — I7782 Antineutrophilic cytoplasmic antibody (ANCA) vasculitis: Secondary | ICD-10-CM

## 2022-07-30 DIAGNOSIS — J301 Allergic rhinitis due to pollen: Secondary | ICD-10-CM | POA: Diagnosis not present

## 2022-07-30 DIAGNOSIS — E782 Mixed hyperlipidemia: Secondary | ICD-10-CM | POA: Diagnosis not present

## 2022-07-30 DIAGNOSIS — Z7984 Long term (current) use of oral hypoglycemic drugs: Secondary | ICD-10-CM

## 2022-07-30 DIAGNOSIS — E1159 Type 2 diabetes mellitus with other circulatory complications: Secondary | ICD-10-CM

## 2022-07-30 DIAGNOSIS — Z6841 Body Mass Index (BMI) 40.0 and over, adult: Secondary | ICD-10-CM

## 2022-07-30 DIAGNOSIS — Z0000 Encounter for general adult medical examination without abnormal findings: Secondary | ICD-10-CM

## 2022-07-30 MED ORDER — CHLORTHALIDONE 25 MG PO TABS
12.5000 mg | ORAL_TABLET | Freq: Every day | ORAL | 1 refills | Status: DC
Start: 2022-07-30 — End: 2023-06-03

## 2022-07-30 MED ORDER — ACCU-CHEK AVIVA PLUS VI STRP
ORAL_STRIP | 3 refills | Status: DC
Start: 2022-07-30 — End: 2023-06-09

## 2022-07-30 MED ORDER — LISINOPRIL 10 MG PO TABS: 10.0000 mg | ORAL_TABLET | Freq: Every day | ORAL | 1 refills | Status: DC

## 2022-07-30 MED ORDER — ACCU-CHEK AVIVA PLUS W/DEVICE KIT: PACK | 0 refills | Status: DC

## 2022-07-30 MED ORDER — RYBELSUS 14 MG PO TABS
14.0000 mg | ORAL_TABLET | Freq: Every day | ORAL | 1 refills | Status: DC
Start: 2022-07-30 — End: 2023-03-31

## 2022-07-30 MED ORDER — ROSUVASTATIN CALCIUM 10 MG PO TABS
10.0000 mg | ORAL_TABLET | Freq: Every day | ORAL | 1 refills | Status: DC
Start: 2022-07-30 — End: 2023-04-22

## 2022-07-30 MED ORDER — ACCU-CHEK SOFTCLIX LANCETS MISC: 2 refills | Status: DC

## 2022-07-30 MED ORDER — METFORMIN HCL 500 MG PO TABS
ORAL_TABLET | ORAL | 1 refills | Status: DC
Start: 2022-07-30 — End: 2023-06-24

## 2022-07-30 MED ORDER — CETIRIZINE HCL 10 MG PO TABS
10.0000 mg | ORAL_TABLET | Freq: Every day | ORAL | 3 refills | Status: DC
Start: 2022-07-30 — End: 2023-06-09

## 2022-07-30 NOTE — Progress Notes (Signed)
Subjective:   Julia Johns is a 72 y.o. female who presents for Medicare Annual (Subsequent) preventive examination.  Visit Complete: In person  Patient Medicare AWV questionnaire was completed by the patient on n/a; I have confirmed that all information answered by patient is correct and no changes since this date.  Review of Systems    Review of Systems  Constitutional:  Negative for chills, fever and malaise/fatigue.  HENT:  Negative for congestion, ear pain and sinus pain.   Eyes:  Negative for discharge.  Respiratory:  Negative for cough and shortness of breath.   Cardiovascular:  Negative for chest pain and palpitations.  Gastrointestinal:  Negative for abdominal pain, constipation, diarrhea, nausea and vomiting.  Genitourinary:  Negative for dysuria and frequency.  Musculoskeletal:  Negative for back pain.  Skin:  Negative for rash.  Neurological:  Negative for dizziness and headaches.  Psychiatric/Behavioral:  Negative for depression and suicidal ideas. The patient is not nervous/anxious.     Cardiac Risk Factors include: diabetes mellitus;hypertension;advanced age (>42men, >54 women);sedentary lifestyle     Objective:    Today's Vitals   07/30/22 0817 07/30/22 0839  BP: 110/70   Pulse: 75   Resp: 12   Temp: (!) 97 F (36.1 C)   SpO2: 97%   Weight: 188 lb (85.3 kg)   Height: 4\' 9"  (1.448 m)   PainSc: 0-No pain 0-No pain   Body mass index is 40.68 kg/m.     07/30/2022    8:22 AM 01/23/2021   10:12 AM 01/19/2020   10:59 AM 12/27/2018    4:50 AM 08/05/2018    8:41 AM 07/21/2018    4:30 AM 07/10/2018   11:27 AM  Advanced Directives  Does Patient Have a Medical Advance Directive? No No No No No No No  Would patient like information on creating a medical advance directive? Yes (ED - Information included in AVS) Yes (MAU/Ambulatory/Procedural Areas - Information given)  No - Patient declined  No - Patient declined     Current Medications  (verified) Outpatient Encounter Medications as of 07/30/2022  Medication Sig   ACCU-CHEK AVIVA PLUS test strip Check as directed.   Accu-Chek Softclix Lancets lancets Use as instructed   Accu-Chek Softclix Lancets lancets USE TO CHECK BLOOD SUGAR TWICE A DAY E11.69   Blood Glucose Monitoring Suppl (ACCU-CHEK AVIVA PLUS) w/Device KIT Check Blood Sugar fasting every morning.   cetirizine (ZYRTEC) 10 MG tablet Take 1 tablet (10 mg total) by mouth daily.   chlorthalidone (HYGROTON) 25 MG tablet Take 0.5 tablets (12.5 mg total) by mouth daily.   glucose blood (ACCU-CHEK AVIVA PLUS) test strip Use as instructed   lisinopril (ZESTRIL) 10 MG tablet Take 1 tablet (10 mg total) by mouth daily.   metFORMIN (GLUCOPHAGE) 500 MG tablet TAKE 1 TABLET BY MOUTH ONCE DAILY AFTER  THE  BIGGEST  MEAL   rosuvastatin (CRESTOR) 10 MG tablet Take 1 tablet (10 mg total) by mouth daily.   Semaglutide (RYBELSUS) 14 MG TABS Take 1 tablet (14 mg total) by mouth daily.   No facility-administered encounter medications on file as of 07/30/2022.    Allergies (verified) Penicillins and Tetracycline   History: Past Medical History:  Diagnosis Date   Chronic kidney disease    Chronic respiratory failure with hypoxia (HCC)    10/09/2018-overnight oximetry- duration of sleep 7 hours and 34 minutes, on room air, SPO2 less than 88% 1 hour and 34 minutes and 28 seconds.    Diabetes mellitus  without complication (HCC)    Grade II hemorrhoids 07/03/2017   Last Assessment & Plan:  Formatting of this note might be different from the original. Fiber supplement stool softener adequate hydration and local hemorrhoid management is advisable.   Vasculitis (HCC)    Vasculitis, ANCA positive (HCC)    Past Surgical History:  Procedure Laterality Date   VIDEO BRONCHOSCOPY Bilateral 06/18/2018   Procedure: VIDEO BRONCHOSCOPY WITH FLUORO;  Surgeon: Oretha Milch, MD;  Location: [facility] ENDOSCOPY;  Service: Cardiopulmonary;  Laterality: Bilateral;    VIDEO BRONCHOSCOPY WITH ENDOBRONCHIAL ULTRASOUND N/A 06/25/2018   Procedure: VIDEO BRONCHOSCOPY WITH ENDOBRONCHIAL ULTRASOUND;  Surgeon: Oretha Milch, MD;  Location: [facility] OR;  Service: Thoracic;  Laterality: N/A;   Family History  Problem Relation Age of Onset   Uterine cancer Mother    Breast cancer Neg Hx    Social History   Socioeconomic History   Marital status: Single    Spouse name: Not on file   Number of children: Not on file   Years of education: Not on file   Highest education level: Not on file  Occupational History   Not on file  Tobacco Use   Smoking status: Never   Smokeless tobacco: Never  Substance and Sexual Activity   Alcohol use: Not on file   Drug use: Never   Sexual activity: Not Currently  Other Topics Concern   Not on file  Social History Narrative   Patient primary language is spanish, she lives at home with her son Shanon Brow and daughter in law [name]    Social Determinants of Health   Financial Resource Strain: Low Risk  (07/30/2022)   Overall Financial Resource Strain (CARDIA)    Difficulty of Paying Living Expenses: Not hard at all  Food Insecurity: No Food Insecurity (07/30/2022)   Hunger Vital Sign    Worried About Running Out of Food in the Last Year: Never true    Ran Out of Food in the Last Year: Never true  Transportation Needs: No Transportation Needs (07/30/2022)   PRAPARE - Administrator, Civil Service (Medical): No    Lack of Transportation (Non-Medical): No  Physical Activity: Inactive (07/30/2022)   Exercise Vital Sign    Days of Exercise per Week: 0 days    Minutes of Exercise per Session: 0 min  Stress: No Stress Concern Present (07/30/2022)   [employer] of Occupational Health - Occupational Stress Questionnaire    Feeling of Stress : Not at all  Social Connections: Moderately Isolated (07/30/2022)   Social Connection and Isolation Panel [NHANES]    Frequency of Communication with Friends and Family:  Never    Frequency of Social Gatherings with Friends and Family: More than three times a week    Attends Religious Services: Never    Database administrator or Organizations: Yes    Attends Banker Meetings: Never    Marital Status: Widowed    Tobacco Counseling Counseling given: Not Answered   Clinical Intake:  Pre-visit preparation completed: Yes  Pain : No/denies pain Pain Score: 0-No pain     BMI - recorded: 40.68 Nutritional Status: BMI > 30  Obese Nutritional Risks: None Diabetes: Yes CBG done?: Yes CBG resulted in Enter/ Edit results?: Yes Did pt. bring in CBG monitor from home?: No  How often do you need to have someone help you when you read instructions, pamphlets, or other written materials from your doctor or pharmacy?: 1 - Never  What is the last grade level you completed in school?: 2nd grade  Interpreter Needed?: Yes Interpreter Name: Milana Na Patient Declined Interpreter : No Patient signed [place] waiver: No      Activities of Daily Living    07/30/2022    8:29 AM 04/09/2022    8:10 AM  In your present state of health, do you have any difficulty performing the following activities:  Hearing? 0 0  Vision? 0 0  Difficulty concentrating or making decisions? 0 0  Walking or climbing stairs? 0 0  Dressing or bathing? 0 0  Doing errands, shopping? 1 0  Preparing Food and eating ? N   Using the Toilet? N   In the past six months, have you accidently leaked urine? N   Do you have problems with loss of bowel control? N   Managing your Medications? N   Managing your Finances? N   Housekeeping or managing your Housekeeping? N     Patient Care Team: Blane Ohara, MD as PCP - General (Family Medicine) Concepcion Living, MD as Referring Physician (Internal Medicine)  Indicate any recent Medical Services you may have received from other than [facility] providers in the past year (date may be approximate).     Assessment:   This is a routine  wellness examination for Trae.  Hearing/Vision screen No results found.  Dietary issues and exercise activities discussed:     Goals Addressed             This Visit's Progress    DIET - REDUCE SUGAR INTAKE   On track    Reduce unhealthy snacking - pay attention to sugar and carbs in the foods you are eating     Exercise 3x per week (30 min per time)   On track     Depression Screen    07/30/2022    7:50 AM 04/09/2022    8:09 AM 12/04/2021    7:46 AM 01/23/2021   10:13 AM 04/18/2020    8:02 AM 01/19/2020   11:00 AM 01/17/2020    9:50 AM  PHQ 2/9 Scores  PHQ - 2 Score 0 0 0 0 0 1 0  PHQ- 9 Score   0        Fall Risk    07/30/2022    8:27 AM 07/30/2022    7:50 AM 04/09/2022    8:09 AM 12/04/2021    7:46 AM 01/23/2021   10:13 AM  Fall Risk   Falls in the past year? 0 0 0 0 0  Number falls in past yr: 0 0 0 0 0  Injury with Fall? 0 0 0 0 0  Risk for fall due to : No Fall Risks No Fall Risks No Fall Risks No Fall Risks No Fall Risks  Follow up Falls prevention discussed Falls evaluation completed Falls evaluation completed Falls evaluation completed Falls evaluation completed;Falls prevention discussed    MEDICARE RISK AT HOME:  Medicare Risk at Home - 07/30/22 1222     Any stairs in or around the home? Yes    If so, are there any without handrails? Yes    Home free of loose throw rugs in walkways, pet beds, electrical cords, etc? No    Adequate lighting in your home to reduce risk of falls? Yes    Life alert? No    Use of a cane, walker or w/c? No    Grab bars in the bathroom? Yes    Shower chair or bench  in shower? Yes    Elevated toilet seat or a handicapped toilet? Yes             TIMED UP AND GO:  Was the test performed?  Yes  Length of time to ambulate 10 feet: 15 sec Gait slow and steady without use of assistive device    Cognitive Function:    07/30/2022    8:34 AM 01/19/2020   11:20 AM  MMSE - Mini Mental State Exam  Orientation to time 5 4   Orientation to Place 5 5  Registration 3 3  Attention/ Calculation 5 4  Recall 1 3  Language- name 2 objects 2 2  Language- repeat 1 0  Language- follow 3 step command 3 2  Language- read & follow direction 1 1  Write a sentence  1  Copy design  1  Total score  26        07/30/2022    8:31 AM 01/23/2021   10:41 AM 01/19/2020   11:09 AM  6CIT Screen  What Year? 0 points 0 points 0 points  What month? 0 points 0 points 0 points  What time? 3 points 0 points 0 points  Count back from 20 0 points 2 points 4 points  Months in reverse 2 points 4 points 4 points  Repeat phrase 0 points 0 points 0 points  Total Score 5 points 6 points 8 points    Immunizations Immunization History  Administered Date(s) Administered   COVID-19, mRNA, vaccine(Comirnaty)12 years and older 12/04/2021   Fluad Quad(high Dose 65+) 10/22/2019, 11/24/2020, 12/04/2021   Influenza-Unspecified 10/23/2017   PFIZER Comirnaty(Gray Top)Covid-19 Tri-Sucrose Vaccine 04/18/2020   PFIZER(Purple Top)SARS-COV-2 Vaccination 04/15/2019, 05/10/2019, 11/18/2019   PNEUMOCOCCAL CONJUGATE-20 04/09/2022   Pfizer Covid-19 Vaccine Bivalent Booster 56yrs & up 11/24/2020   Pneumococcal Conjugate-13 04/18/2020    TDAP status: Due, Education has been provided regarding the importance of this vaccine. Advised may receive this vaccine at local pharmacy or Health Dept. Aware to provide a copy of the vaccination record if obtained from local pharmacy or Health Dept. Verbalized acceptance and understanding.  Flu Vaccine status: Up to date  Pneumococcal vaccine status: Up to date  Covid-19 vaccine status: Completed vaccines  Qualifies for Shingles Vaccine? Yes   Zostavax completed No   Shingrix Completed?: No.    Education has been provided regarding the importance of this vaccine. Patient has been advised to call insurance company to determine out of pocket expense if they have not yet received this vaccine. Advised may also receive  vaccine at local pharmacy or Health Dept. Verbalized acceptance and understanding.  Screening Tests Health Maintenance  Topic Date Due   DTaP/Tdap/Td (1 - Tdap) Never done   Zoster Vaccines- Shingrix (1 of 2) Never done   OPHTHALMOLOGY EXAM  04/25/2021   MAMMOGRAM  07/30/2023 (Originally 02/21/2022)   INFLUENZA VACCINE  08/22/2022   HEMOGLOBIN A1C  10/10/2022   Diabetic kidney evaluation - Urine ACR  04/09/2023   FOOT EXAM  04/09/2023   Diabetic kidney evaluation - eGFR measurement  05/10/2023   Medicare Annual Wellness (AWV)  07/30/2023   Colonoscopy  07/05/2027   Pneumonia Vaccine 58+ Years old  Completed   DEXA SCAN  Completed   COVID-19 Vaccine  Completed   Hepatitis C Screening  Completed   HPV VACCINES  Aged Out    Health Maintenance  Health Maintenance Due  Topic Date Due   DTaP/Tdap/Td (1 - Tdap) Never done   Zoster Vaccines- Shingrix (  1 of 2) Never done   OPHTHALMOLOGY EXAM  04/25/2021    Colorectal cancer screening: Type of screening: Colonoscopy. Completed 07/04/2017. Repeat every 10 years  Mammogram status: Completed 02/21/2021. Repeat every year Patient refused.  Bone Density status: Completed 02/08/2021. Results reflect: Bone density results: NORMAL. Repeat every 5 years.  Lung Cancer Screening: (Low Dose CT Chest recommended if Age 31-80 years, 20 pack-year currently smoking OR have quit w/in 15years.) does not qualify.   Lung Cancer Screening Referral: n/a  Additional Screening:  Hepatitis C Screening: does not qualify; Completed 07/05/2018  Vision Screening: Recommended annual ophthalmology exams for early detection of glaucoma and other disorders of the eye. Is the patient up to date with their annual eye exam?  No    Dental Screening: Recommended annual dental exams for proper oral hygiene  Diabetic Foot Exam: Diabetic Foot Exam: Completed 07/30/2022  Community Resource Referral / Chronic Care Management: CRR required this visit?  No   CCM  required this visit?  No     Plan:     I have personally reviewed and noted the following in the patient's chart:   Medical and social history Use of alcohol, tobacco or illicit drugs  Current medications and supplements including opioid prescriptions. Patient is not currently taking opioid prescriptions. Functional ability and status Nutritional status Physical activity Advanced directives List of other physicians Hospitalizations, surgeries, and ER visits in previous 12 months Vitals Screenings to include cognitive, depression, and falls Referrals and appointments  In addition, I have reviewed and discussed with patient certain preventive protocols, quality metrics, and best practice recommendations. A written personalized care plan for preventive services as well as general preventive health recommendations were provided to patient.     Eugenie Norrie, CMA   07/30/2022   After Visit Summary: Patient was seen at the office.  Nurse Notes: I spent 30 minutes face to face. She refused to have a mammogram this year.  Patient's son will call to schedule an appointment with eye doctor.

## 2022-07-30 NOTE — Patient Instructions (Addendum)
Patient may try OTC voltaren gel for arthritis.

## 2022-07-30 NOTE — Patient Instructions (Signed)

## 2022-07-30 NOTE — Assessment & Plan Note (Signed)
Management per specialist.  Dr. Patel 

## 2022-07-30 NOTE — Assessment & Plan Note (Signed)
Recommend continue to work on eating healthy diet and exercise.  

## 2022-07-31 LAB — HEMOGLOBIN A1C
Est. average glucose Bld gHb Est-mCnc: 143 mg/dL
Hgb A1c MFr Bld: 6.6 % — ABNORMAL HIGH (ref 4.8–5.6)

## 2022-07-31 LAB — CBC WITH DIFFERENTIAL/PLATELET
Basophils Absolute: 0.1 10*3/uL (ref 0.0–0.2)
Basos: 1 %
EOS (ABSOLUTE): 0.3 10*3/uL (ref 0.0–0.4)
Eos: 3 %
Hematocrit: 43.9 % (ref 34.0–46.6)
Hemoglobin: 14.5 g/dL (ref 11.1–15.9)
Immature Grans (Abs): 0 10*3/uL (ref 0.0–0.1)
Immature Granulocytes: 0 %
Lymphocytes Absolute: 3.3 10*3/uL — ABNORMAL HIGH (ref 0.7–3.1)
Lymphs: 39 %
MCH: 28.9 pg (ref 26.6–33.0)
MCHC: 33 g/dL (ref 31.5–35.7)
MCV: 88 fL (ref 79–97)
Monocytes Absolute: 0.4 10*3/uL (ref 0.1–0.9)
Monocytes: 5 %
Neutrophils Absolute: 4.3 10*3/uL (ref 1.4–7.0)
Neutrophils: 52 %
Platelets: 325 10*3/uL (ref 150–450)
RBC: 5.01 x10E6/uL (ref 3.77–5.28)
RDW: 13.9 % (ref 11.7–15.4)
WBC: 8.4 10*3/uL (ref 3.4–10.8)

## 2022-07-31 LAB — COMPREHENSIVE METABOLIC PANEL
ALT: 16 IU/L (ref 0–32)
AST: 17 IU/L (ref 0–40)
Albumin: 4.2 g/dL (ref 3.8–4.8)
Alkaline Phosphatase: 123 IU/L — ABNORMAL HIGH (ref 44–121)
BUN/Creatinine Ratio: 21 (ref 12–28)
BUN: 12 mg/dL (ref 8–27)
Bilirubin Total: 0.4 mg/dL (ref 0.0–1.2)
CO2: 20 mmol/L (ref 20–29)
Calcium: 9.9 mg/dL (ref 8.7–10.3)
Chloride: 103 mmol/L (ref 96–106)
Creatinine, Ser: 0.56 mg/dL — ABNORMAL LOW (ref 0.57–1.00)
Globulin, Total: 2.7 g/dL (ref 1.5–4.5)
Glucose: 99 mg/dL (ref 70–99)
Potassium: 4.7 mmol/L (ref 3.5–5.2)
Sodium: 141 mmol/L (ref 134–144)
Total Protein: 6.9 g/dL (ref 6.0–8.5)
eGFR: 97 mL/min/{1.73_m2} (ref >59)

## 2022-07-31 LAB — LIPID PANEL
Chol/HDL Ratio: 2.2 ratio (ref 0.0–4.4)
Cholesterol, Total: 146 mg/dL (ref 100–199)
HDL: 65 mg/dL (ref >39)
LDL Chol Calc (NIH): 60 mg/dL (ref 0–99)
Triglycerides: 123 mg/dL (ref 0–149)
VLDL Cholesterol Cal: 21 mg/dL (ref 5–40)

## 2022-07-31 LAB — MICROALBUMIN / CREATININE URINE RATIO
Creatinine, Urine: 43.4 mg/dL
Microalb/Creat Ratio: 97 mg/g creat — ABNORMAL HIGH (ref 0–29)
Microalbumin, Urine: 42 ug/mL

## 2022-08-01 ENCOUNTER — Other Ambulatory Visit: Payer: Self-pay

## 2022-08-01 LAB — METHYLMALONIC ACID, SERUM: Methylmalonic Acid: 159 nmol/L (ref 0–378)

## 2022-08-01 LAB — B12 AND FOLATE PANEL
Folate: 13.9 ng/mL (ref >3.0)
Vitamin B-12: >2000 pg/mL — ABNORMAL HIGH (ref 232–1245)

## 2022-08-01 LAB — TSH: TSH: 0.903 u[IU]/mL (ref 0.450–4.500)

## 2022-11-17 NOTE — Progress Notes (Unsigned)
Subjective:  Patient ID: Julia Johns, female    DOB: [birth year]  Age: 72 y.o. MRN: [id number]  No chief complaint on file.   HPI    Diabetes:  Complications: Hyperlipidemia, Hypertension. Glucose checking: every day Glucose logs: 111-125 mg/dl Hypoglycemia none Most recent A1C: 6.6 Current medications: Metformin 500 mg daily, Rybelsus 14 mg daily. Last Eye Exam: 04/25/2020. Needs appt. Son plans to take her to the eye doctor,  Foot checks: no check her feet.  Hyperlipidemia: Current medications: Rosuvastatin 10 mg daily.  Hypertension: Complications:Diabetes, Hyperlipidemia. Current medications: Chlorthalidone 25 mg 1/2 tablet daily.   Allergic rhinitis: zyrtec 10 mg before bed.      07/30/2022    7:50 AM 04/09/2022    8:09 AM 12/04/2021    7:46 AM 01/23/2021   10:13 AM 04/18/2020    8:02 AM  Depression screen PHQ 2/9  Decreased Interest 0 0 0 0 0  Down, Depressed, Hopeless 0 0 0 0 0  PHQ - 2 Score 0 0 0 0 0  Altered sleeping   0    Tired, decreased energy   0    Change in appetite   0    Feeling bad or failure about yourself    0    Trouble concentrating   0    Moving slowly or fidgety/restless   0    Suicidal thoughts   0    PHQ-9 Score   0    Difficult doing work/chores   Not difficult at all          07/30/2022    8:27 AM  Fall Risk   Falls in the past year? 0  Number falls in past yr: 0  Injury with Fall? 0  Risk for fall due to : No Fall Risks  Follow up Falls prevention discussed    Patient Care Team: Blane Ohara, MD as PCP - General (Family Medicine) Concepcion Living, MD as Referring Physician (Internal Medicine)   Review of Systems  Current Outpatient Medications on File Prior to Visit  Medication Sig Dispense Refill   ACCU-CHEK AVIVA PLUS test strip Check as directed. 100 each 3   Accu-Chek Softclix Lancets lancets Use as instructed 100 each 12   Accu-Chek Softclix Lancets lancets USE TO CHECK BLOOD SUGAR TWICE A DAY E11.69 100 each 2    Blood Glucose Monitoring Suppl (ACCU-CHEK AVIVA PLUS) w/Device KIT Check Blood Sugar fasting every morning. 1 kit 0   cetirizine (ZYRTEC) 10 MG tablet Take 1 tablet (10 mg total) by mouth daily. 90 tablet 3   chlorthalidone (HYGROTON) 25 MG tablet Take 0.5 tablets (12.5 mg total) by mouth daily. 45 tablet 1   glucose blood (ACCU-CHEK AVIVA PLUS) test strip Use as instructed 100 each 12   lisinopril (ZESTRIL) 10 MG tablet Take 1 tablet (10 mg total) by mouth daily. 90 tablet 1   metFORMIN (GLUCOPHAGE) 500 MG tablet TAKE 1 TABLET BY MOUTH ONCE DAILY AFTER  THE  BIGGEST  MEAL 90 tablet 1   rosuvastatin (CRESTOR) 10 MG tablet Take 1 tablet (10 mg total) by mouth daily. 90 tablet 1   Semaglutide (RYBELSUS) 14 MG TABS Take 1 tablet (14 mg total) by mouth daily. 90 tablet 1   No current facility-administered medications on file prior to visit.   Past Medical History:  Diagnosis Date   Chronic kidney disease    Chronic respiratory failure with hypoxia (HCC)    10/09/2018-overnight oximetry- duration of sleep 7 hours and 34  minutes, on room air, SPO2 less than 88% 1 hour and 34 minutes and 28 seconds.    Diabetes mellitus without complication (HCC)    Grade II hemorrhoids 07/03/2017   Last Assessment & Plan:  Formatting of this note might be different from the original. Fiber supplement stool softener adequate hydration and local hemorrhoid management is advisable.   Vasculitis (HCC)    Vasculitis, ANCA positive (HCC)    Past Surgical History:  Procedure Laterality Date   VIDEO BRONCHOSCOPY Bilateral 06/18/2018   Procedure: VIDEO BRONCHOSCOPY WITH FLUORO;  Surgeon: Oretha Milch, MD;  Location: [facility] ENDOSCOPY;  Service: Cardiopulmonary;  Laterality: Bilateral;   VIDEO BRONCHOSCOPY WITH ENDOBRONCHIAL ULTRASOUND N/A 06/25/2018   Procedure: VIDEO BRONCHOSCOPY WITH ENDOBRONCHIAL ULTRASOUND;  Surgeon: Oretha Milch, MD;  Location: [facility] OR;  Service: Thoracic;  Laterality: N/A;    Family History  Problem  Relation Age of Onset   Uterine cancer Mother    Breast cancer Neg Hx    Social History   Socioeconomic History   Marital status: Single    Spouse name: Not on file   Number of children: Not on file   Years of education: Not on file   Highest education level: Not on file  Occupational History   Not on file  Tobacco Use   Smoking status: Never   Smokeless tobacco: Never  Substance and Sexual Activity   Alcohol use: Not on file   Drug use: Never   Sexual activity: Not Currently  Other Topics Concern   Not on file  Social History Narrative   Patient primary language is spanish, she lives at home with her son Julia Brow and daughter in law Julia Johns    Social Determinants of Health   Financial Resource Strain: Low Risk  (07/30/2022)   Overall Financial Resource Strain (CARDIA)    Difficulty of Paying Living Expenses: Not hard at all  Food Insecurity: No Food Insecurity (07/30/2022)   Hunger Vital Sign    Worried About Running Out of Food in the Last Year: Never true    Ran Out of Food in the Last Year: Never true  Transportation Needs: No Transportation Needs (07/30/2022)   PRAPARE - Administrator, Civil Service (Medical): No    Lack of Transportation (Non-Medical): No  Physical Activity: Inactive (07/30/2022)   Exercise Vital Sign    Days of Exercise per Week: 0 days    Minutes of Exercise per Session: 0 min  Stress: No Stress Concern Present (07/30/2022)   [employer] of Occupational Health - Occupational Stress Questionnaire    Feeling of Stress : Not at all  Social Connections: Moderately Isolated (07/30/2022)   Social Connection and Isolation Panel [NHANES]    Frequency of Communication with Friends and Family: Never    Frequency of Social Gatherings with Friends and Family: More than three times a week    Attends Religious Services: Never    Database administrator or Organizations: Yes    Attends Banker Meetings: Never    Marital  Status: Widowed    Objective:  There were no vitals taken for this visit.     07/30/2022    8:17 AM 07/30/2022    7:43 AM 04/09/2022    8:01 AM  BP/Weight  Systolic BP 110 110 128  Diastolic BP 70 70 74  Wt. (Lbs) 188 188.8 180  BMI 40.68 kg/m2 40.86 kg/m2 38.95 kg/m2    Physical Exam  Diabetic Foot Exam -  Simple   No data filed      Lab Results  Component Value Date   WBC 8.4 07/30/2022   HGB 14.5 07/30/2022   HCT 43.9 07/30/2022   PLT 325 07/30/2022   GLUCOSE 99 07/30/2022   CHOL 146 07/30/2022   TRIG 123 07/30/2022   HDL 65 07/30/2022   LDLCALC 60 07/30/2022   ALT 16 07/30/2022   AST 17 07/30/2022   NA 141 07/30/2022   K 4.7 07/30/2022   CL 103 07/30/2022   CREATININE 0.56 (L) 07/30/2022   BUN 12 07/30/2022   CO2 20 07/30/2022   TSH 0.903 07/30/2022   INR 1.2 07/05/2018   HGBA1C 6.6 (H) 07/30/2022   MICROALBUR 30 04/18/2020      Assessment & Plan:    Hypertension associated with diabetes (HCC) Assessment & Plan: Htn Well controlled.  No changes to medicines. Taking chlorthalidone 25 mg 1/2 tablet daily. Continue to work on eating a healthy diet and exercise.  Labs drawn today.        Diabetic glomerulopathy (HCC) Assessment & Plan: Control:   Recommend check sugars fasting daily. Recommend check feet daily. Recommend annual eye exams. Continue Rybelsus 14 mg daily, and metformin 500 mg daily Continue to work on eating a healthy diet and exercise.  Labs drawn today.      Mixed hyperlipidemia Assessment & Plan: Well controlled.  No changes to medicines. Rosuvastatin 10 mg daily. Continue to work on eating a healthy diet and exercise.  Labs drawn today.        No orders of the defined types were placed in this encounter.   No orders of the defined types were placed in this encounter.    Follow-up: No follow-ups on file.   I,Julia Johns,acting as a scribe for Blane Ohara, MD.,have documented all relevant documentation  on the behalf of Blane Ohara, MD,as directed by  Blane Ohara, MD while in the presence of Blane Ohara, MD.   An After Visit Summary was printed and given to the patient.  Blane Ohara, MD [facility] [phone]

## 2022-11-17 NOTE — Assessment & Plan Note (Signed)
Well controlled.  No changes to medicines. Rosuvastatin 10 mg daily. Continue to work on eating a healthy diet and exercise.  Labs drawn today.   

## 2022-11-17 NOTE — Assessment & Plan Note (Addendum)
Htn Well controlled.  No changes to medicines. Taking chlorthalidone 25 mg 1/2 tablet daily. Continue to work on eating a healthy diet and exercise.  Labs drawn today.

## 2022-11-17 NOTE — Assessment & Plan Note (Signed)
Control:   Recommend check sugars fasting daily. Recommend check feet daily. Recommend annual eye exams. Continue Rybelsus 14 mg daily, and metformin 500 mg daily Continue to work on eating a healthy diet and exercise.  Labs drawn today.

## 2022-11-18 ENCOUNTER — Ambulatory Visit (INDEPENDENT_AMBULATORY_CARE_PROVIDER_SITE_OTHER): Payer: Medicare HMO | Admitting: Family Medicine

## 2022-11-18 ENCOUNTER — Encounter: Payer: Self-pay | Admitting: Family Medicine

## 2022-11-18 VITALS — BP 134/74 | HR 78 | Temp 97.6°F | Ht <= 58 in | Wt 189.0 lb

## 2022-11-18 DIAGNOSIS — J301 Allergic rhinitis due to pollen: Secondary | ICD-10-CM

## 2022-11-18 DIAGNOSIS — E1121 Type 2 diabetes mellitus with diabetic nephropathy: Secondary | ICD-10-CM | POA: Diagnosis not present

## 2022-11-18 DIAGNOSIS — I152 Hypertension secondary to endocrine disorders: Secondary | ICD-10-CM | POA: Diagnosis not present

## 2022-11-18 DIAGNOSIS — I7782 Antineutrophilic cytoplasmic antibody (ANCA) vasculitis: Secondary | ICD-10-CM

## 2022-11-18 DIAGNOSIS — E1159 Type 2 diabetes mellitus with other circulatory complications: Secondary | ICD-10-CM

## 2022-11-18 DIAGNOSIS — E782 Mixed hyperlipidemia: Secondary | ICD-10-CM | POA: Diagnosis not present

## 2022-11-18 DIAGNOSIS — Z23 Encounter for immunization: Secondary | ICD-10-CM | POA: Diagnosis not present

## 2022-11-18 NOTE — Assessment & Plan Note (Signed)
Management per specialist.  Dr. Posey Pronto

## 2022-11-18 NOTE — Assessment & Plan Note (Signed)
Continue to follow-up with Dr. Allena Katz with nephrology Continue [name] infusions for ANCA vasculitis

## 2022-11-18 NOTE — Patient Instructions (Signed)
Recommend allegra or claritin (stop zyrtec.)

## 2022-11-18 NOTE — Assessment & Plan Note (Signed)
Recommend allegra or claritin (stop zyrtec.)

## 2022-11-19 LAB — HEMOGLOBIN A1C
Est. average glucose Bld gHb Est-mCnc: 140 mg/dL
Hgb A1c MFr Bld: 6.5 % — ABNORMAL HIGH (ref 4.8–5.6)

## 2022-11-19 LAB — COMPREHENSIVE METABOLIC PANEL
ALT: 15 [IU]/L (ref 0–32)
AST: 15 [IU]/L (ref 0–40)
Albumin: 4 g/dL (ref 3.8–4.8)
Alkaline Phosphatase: 121 [IU]/L (ref 44–121)
BUN/Creatinine Ratio: 25 (ref 12–28)
BUN: 15 mg/dL (ref 8–27)
Bilirubin Total: 0.4 mg/dL (ref 0.0–1.2)
CO2: 22 mmol/L (ref 20–29)
Calcium: 9.9 mg/dL (ref 8.7–10.3)
Chloride: 101 mmol/L (ref 96–106)
Creatinine, Ser: 0.61 mg/dL (ref 0.57–1.00)
Globulin, Total: 2.8 g/dL (ref 1.5–4.5)
Glucose: 96 mg/dL (ref 70–99)
Potassium: 4 mmol/L (ref 3.5–5.2)
Sodium: 140 mmol/L (ref 134–144)
Total Protein: 6.8 g/dL (ref 6.0–8.5)
eGFR: 95 mL/min/{1.73_m2} (ref >59)

## 2022-11-19 LAB — CBC WITH DIFFERENTIAL/PLATELET
Basophils Absolute: 0.1 10*3/uL (ref 0.0–0.2)
Basos: 1 %
EOS (ABSOLUTE): 0.3 10*3/uL (ref 0.0–0.4)
Eos: 4 %
Hematocrit: 44 % (ref 34.0–46.6)
Hemoglobin: 14 g/dL (ref 11.1–15.9)
Immature Grans (Abs): 0 10*3/uL (ref 0.0–0.1)
Immature Granulocytes: 0 %
Lymphocytes Absolute: 2.8 10*3/uL (ref 0.7–3.1)
Lymphs: 38 %
MCH: 28.9 pg (ref 26.6–33.0)
MCHC: 31.8 g/dL (ref 31.5–35.7)
MCV: 91 fL (ref 79–97)
Monocytes Absolute: 0.4 10*3/uL (ref 0.1–0.9)
Monocytes: 5 %
Neutrophils Absolute: 4 10*3/uL (ref 1.4–7.0)
Neutrophils: 52 %
Platelets: 351 10*3/uL (ref 150–450)
RBC: 4.84 x10E6/uL (ref 3.77–5.28)
RDW: 13.6 % (ref 11.7–15.4)
WBC: 7.5 10*3/uL (ref 3.4–10.8)

## 2022-11-19 LAB — LIPID PANEL
Chol/HDL Ratio: 2.5 ratio (ref 0.0–4.4)
Cholesterol, Total: 138 mg/dL (ref 100–199)
HDL: 55 mg/dL (ref >39)
LDL Chol Calc (NIH): 56 mg/dL (ref 0–99)
Triglycerides: 159 mg/dL — ABNORMAL HIGH (ref 0–149)
VLDL Cholesterol Cal: 27 mg/dL (ref 5–40)

## 2022-12-19 ENCOUNTER — Other Ambulatory Visit: Payer: Self-pay | Admitting: Family Medicine

## 2022-12-30 ENCOUNTER — Ambulatory Visit: Payer: Medicare HMO | Admitting: Behavioral Health

## 2023-01-08 ENCOUNTER — Ambulatory Visit: Payer: Medicare HMO | Admitting: Behavioral Health

## 2023-01-25 ENCOUNTER — Other Ambulatory Visit: Payer: Self-pay | Admitting: Family Medicine

## 2023-02-06 ENCOUNTER — Other Ambulatory Visit: Payer: Self-pay | Admitting: Family Medicine

## 2023-03-06 ENCOUNTER — Encounter: Payer: Self-pay | Admitting: Family Medicine

## 2023-03-06 ENCOUNTER — Ambulatory Visit (INDEPENDENT_AMBULATORY_CARE_PROVIDER_SITE_OTHER): Payer: Medicare HMO | Admitting: Family Medicine

## 2023-03-06 VITALS — BP 128/74 | HR 68 | Temp 98.2°F | Ht <= 58 in | Wt 189.0 lb

## 2023-03-06 DIAGNOSIS — I152 Hypertension secondary to endocrine disorders: Secondary | ICD-10-CM

## 2023-03-06 DIAGNOSIS — E1121 Type 2 diabetes mellitus with diabetic nephropathy: Secondary | ICD-10-CM | POA: Diagnosis not present

## 2023-03-06 DIAGNOSIS — Z6841 Body Mass Index (BMI) 40.0 and over, adult: Secondary | ICD-10-CM

## 2023-03-06 DIAGNOSIS — I10 Essential (primary) hypertension: Secondary | ICD-10-CM | POA: Diagnosis not present

## 2023-03-06 DIAGNOSIS — E782 Mixed hyperlipidemia: Secondary | ICD-10-CM | POA: Diagnosis not present

## 2023-03-06 DIAGNOSIS — I7782 Antineutrophilic cytoplasmic antibody (ANCA) vasculitis: Secondary | ICD-10-CM | POA: Diagnosis not present

## 2023-03-06 DIAGNOSIS — E1159 Type 2 diabetes mellitus with other circulatory complications: Secondary | ICD-10-CM | POA: Diagnosis not present

## 2023-03-06 MED ORDER — LANCET DEVICE MISC: 0 refills | Status: DC

## 2023-03-06 MED ORDER — LOSARTAN POTASSIUM 25 MG PO TABS
25.0000 mg | ORAL_TABLET | Freq: Every day | ORAL | 1 refills | Status: DC
Start: 2023-03-06 — End: 2023-06-09

## 2023-03-06 NOTE — Patient Instructions (Addendum)
Paras lisinopril y comienzas losartan 25 mg una pastilla cad dia.

## 2023-03-06 NOTE — Progress Notes (Signed)
Subjective:  Patient ID: Julia Johns, female    DOB: 05-Sep-1950  Age: 73 y.o. MRN: [id number]  Chief Complaint  Patient presents with   Medical Management of Chronic Issues    HPI   Diabetes:  Complications: Hyperlipidemia, Hypertension. Glucose checking: every other day.  Lost lancet device and needs new one. Glucose logs: 115-125 mg/dl Hypoglycemia none Most recent A1C: 6.5 Current medications: Metformin 500 mg daily, Rybelsus 14 mg daily. Last Eye Exam: 04/2022  Foot checks: checks feet daily  Hyperlipidemia: Current medications: Rosuvastatin 10 mg daily.  Hypertension: Complications:Diabetes, Hyperlipidemia. Current medications: Chlorthalidone 25 mg 1/2 tablet daily.   Allergic rhinitis: zyrtec 10 mg before bed. Not working. Patient is having runny nose and itchy eyes/face.  C/o mild cough at night and bad taste in her mouth in the morning sometimes.     11/18/2022    7:58 AM 07/30/2022    7:50 AM 04/09/2022    8:09 AM 12/04/2021    7:46 AM 01/23/2021   10:13 AM  Depression screen PHQ 2/9  Decreased Interest 0 0 0 0 0  Down, Depressed, Hopeless 0 0 0 0 0  PHQ - 2 Score 0 0 0 0 0  Altered sleeping    0   Tired, decreased energy    0   Change in appetite    0   Feeling bad or failure about yourself     0   Trouble concentrating    0   Moving slowly or fidgety/restless    0   Suicidal thoughts    0   PHQ-9 Score    0   Difficult doing work/chores    Not difficult at all         03/06/2023    8:07 AM  Fall Risk   Falls in the past year? 0  Number falls in past yr: 0  Injury with Fall? 0  Risk for fall due to : No Fall Risks  Follow up Falls evaluation completed    Patient Care Team: Blane Ohara, MD as PCP - General (Family Medicine) Concepcion Living, MD as Referring Physician (Internal Medicine)   Review of Systems  Constitutional:  Negative for chills, fatigue and fever.  HENT:  Negative for congestion, ear pain, rhinorrhea and sore throat.    Respiratory:  Negative for cough and shortness of breath.   Cardiovascular:  Negative for chest pain.  Gastrointestinal:  Negative for abdominal pain, constipation, diarrhea, nausea and vomiting.  Genitourinary:  Negative for dysuria and urgency.  Musculoskeletal:  Negative for back pain and myalgias.  Neurological:  Negative for dizziness, weakness, light-headedness and headaches.  Psychiatric/Behavioral:  Negative for dysphoric mood. The patient is not nervous/anxious.     Current Outpatient Medications on File Prior to Visit  Medication Sig Dispense Refill   ACCU-CHEK AVIVA PLUS test strip Check as directed. 100 each 3   Accu-Chek Softclix Lancets lancets Use as instructed 100 each 12   Accu-Chek Softclix Lancets lancets USE 1  TO CHECK GLUCOSE TWICE DAILY 100 each 0   Blood Glucose Monitoring Suppl (ACCU-CHEK AVIVA PLUS) w/Device KIT Check Blood Sugar fasting every morning. 1 kit 0   cetirizine (ZYRTEC) 10 MG tablet Take 1 tablet (10 mg total) by mouth daily. 90 tablet 3   chlorthalidone (HYGROTON) 25 MG tablet Take 0.5 tablets (12.5 mg total) by mouth daily. 45 tablet 1   glucose blood (ACCU-CHEK AVIVA PLUS) test strip Use as instructed 100 each 12   metFORMIN (  GLUCOPHAGE) 500 MG tablet TAKE 1 TABLET BY MOUTH ONCE DAILY AFTER  THE  BIGGEST  MEAL 90 tablet 1   rosuvastatin (CRESTOR) 10 MG tablet Take 1 tablet (10 mg total) by mouth daily. 90 tablet 1   Semaglutide (RYBELSUS) 14 MG TABS Take 1 tablet (14 mg total) by mouth daily. 90 tablet 1   No current facility-administered medications on file prior to visit.   Past Medical History:  Diagnosis Date   Chronic kidney disease    Chronic respiratory failure with hypoxia (HCC)    10/09/2018-overnight oximetry- duration of sleep 7 hours and 34 minutes, on room air, SPO2 less than 88% 1 hour and 34 minutes and 28 seconds.    Diabetes mellitus without complication (HCC)    Grade II hemorrhoids 07/03/2017   Last Assessment & Plan:   Formatting of this note might be different from the original. Fiber supplement stool softener adequate hydration and local hemorrhoid management is advisable.   Vasculitis (HCC)    Vasculitis, ANCA positive (HCC)    Past Surgical History:  Procedure Laterality Date   VIDEO BRONCHOSCOPY Bilateral 06/18/2018   Procedure: VIDEO BRONCHOSCOPY WITH FLUORO;  Surgeon: Oretha Milch, MD;  Location: [facility] ENDOSCOPY;  Service: Cardiopulmonary;  Laterality: Bilateral;   VIDEO BRONCHOSCOPY WITH ENDOBRONCHIAL ULTRASOUND N/A 06/25/2018   Procedure: VIDEO BRONCHOSCOPY WITH ENDOBRONCHIAL ULTRASOUND;  Surgeon: Oretha Milch, MD;  Location: [facility] OR;  Service: Thoracic;  Laterality: N/A;    Family History  Problem Relation Age of Onset   Uterine cancer Mother    Breast cancer Neg Hx    Social History   Socioeconomic History   Marital status: Single    Spouse name: Not on file   Number of children: Not on file   Years of education: Not on file   Highest education level: Not on file  Occupational History   Not on file  Tobacco Use   Smoking status: Never   Smokeless tobacco: Never  Substance and Sexual Activity   Alcohol use: Not on file   Drug use: Never   Sexual activity: Not Currently  Other Topics Concern   Not on file  Social History Narrative   Patient primary language is spanish, she lives at home with her son Julia Johns and daughter in law Julia Johns    Social Drivers of Health   Financial Resource Strain: Low Risk  (07/30/2022)   Overall Financial Resource Strain (CARDIA)    Difficulty of Paying Living Expenses: Not hard at all  Food Insecurity: No Food Insecurity (07/30/2022)   Hunger Vital Sign    Worried About Running Out of Food in the Last Year: Never true    Ran Out of Food in the Last Year: Never true  Transportation Needs: No Transportation Needs (07/30/2022)   PRAPARE - Administrator, Civil Service (Medical): No    Lack of Transportation (Non-Medical): No   Physical Activity: Inactive (07/30/2022)   Exercise Vital Sign    Days of Exercise per Week: 0 days    Minutes of Exercise per Session: 0 min  Stress: No Stress Concern Present (07/30/2022)   [employer] of Occupational Health - Occupational Stress Questionnaire    Feeling of Stress : Not at all  Social Connections: Moderately Isolated (07/30/2022)   Social Connection and Isolation Panel [NHANES]    Frequency of Communication with Friends and Family: Never    Frequency of Social Gatherings with Friends and Family: More than three times a week  Attends Religious Services: Never    Active Member of Clubs or Organizations: Yes    Attends Banker Meetings: Never    Marital Status: Widowed    Objective:  BP 128/74   Pulse 68   Temp 98.2 F (36.8 C)   Ht 4\' 9"  (1.448 m)   Wt 189 lb (85.7 kg)   SpO2 98%   BMI 40.90 kg/m      03/06/2023    7:59 AM 11/18/2022    7:54 AM 07/30/2022    8:17 AM  BP/Weight  Systolic BP 128 134 110  Diastolic BP 74 74 70  Wt. (Lbs) 189 189 188  BMI 40.9 kg/m2 40.9 kg/m2 40.68 kg/m2    Physical Exam Vitals reviewed.  Constitutional:      Appearance: Normal appearance. She is obese.  HENT:     Right Ear: Tympanic membrane normal.     Left Ear: Tympanic membrane normal.     Nose: Nose normal.     Mouth/Throat:     Pharynx: No oropharyngeal exudate or posterior oropharyngeal erythema.  Neck:     Vascular: No carotid bruit.  Cardiovascular:     Rate and Rhythm: Normal rate and regular rhythm.     Heart sounds: Normal heart sounds.  Pulmonary:     Effort: Pulmonary effort is normal. No respiratory distress.     Breath sounds: Normal breath sounds.  Abdominal:     General: Abdomen is flat. Bowel sounds are normal.     Palpations: Abdomen is soft.     Tenderness: There is no abdominal tenderness.  Neurological:     Mental Status: She is alert and oriented to person, place, and time.  Psychiatric:        Mood and Affect: Mood  normal.        Behavior: Behavior normal.     Diabetic Foot Exam - Simple   Simple Foot Form  03/06/2023  7:18 PM  Visual Inspection No deformities, no ulcerations, no other skin breakdown bilaterally: Yes Sensation Testing Intact to touch and monofilament testing bilaterally: Yes Pulse Check Posterior Tibialis and Dorsalis pulse intact bilaterally: Yes Comments      Lab Results  Component Value Date   WBC 10.1 03/06/2023   HGB 15.0 03/06/2023   HCT 46.5 03/06/2023   PLT 357 03/06/2023   GLUCOSE 99 03/06/2023   CHOL 130 03/06/2023   TRIG 126 03/06/2023   HDL 59 03/06/2023   LDLCALC 49 03/06/2023   ALT 15 03/06/2023   AST 17 03/06/2023   NA 138 03/06/2023   K 4.6 03/06/2023   CL 101 03/06/2023   CREATININE 0.69 03/06/2023   BUN 17 03/06/2023   CO2 22 03/06/2023   TSH 1.090 03/06/2023   INR 1.2 07/05/2018   HGBA1C 6.9 (H) 03/06/2023   MICROALBUR 30 04/18/2020      Assessment & Plan:    Diabetic glomerulopathy (HCC) Assessment & Plan: Control:  good Recommend check sugars fasting daily. Recommend check feet daily. Recommend annual eye exams. Continue Rybelsus 14 mg daily, and metformin 500 mg daily Continue to work on eating a healthy diet and exercise.  Labs drawn today.     Orders: -     Hemoglobin A1c -     Microalbumin / creatinine urine ratio -     Lancet Device; Use as directed to check sugar daily.  Dispense: 1 each; Refill: 0  Mixed hyperlipidemia Assessment & Plan: Well controlled.  No changes to medicines. Rosuvastatin 10  mg daily. Continue to work on eating a healthy diet and exercise.  Labs drawn today.    Orders: -     Lipid panel -     TSH  ANCA-associated vasculitis (HCC) Assessment & Plan: Management per specialist.     Morbid obesity with BMI of 40.0-44.9, adult [facility]) Assessment & Plan: Recommend continue to work on eating healthy diet and exercise.    Essential hypertension, benign Assessment & Plan: Stop lisinopril.  Start Losartan 25 mg daily. Hope cough improves with medication change.    Orders: -     CBC with Differential/Platelet -     Comprehensive metabolic panel -     Losartan Potassium; Take 1 tablet (25 mg total) by mouth daily.  Dispense: 90 tablet; Refill: 1     Meds ordered this encounter  Medications   Lancet Device MISC    Sig: Use as directed to check sugar daily.    Dispense:  1 each    Refill:  0   losartan (COZAAR) 25 MG tablet    Sig: Take 1 tablet (25 mg total) by mouth daily.    Dispense:  90 tablet    Refill:  1    Orders Placed This Encounter  Procedures   CBC with Differential/Platelet   Comprehensive metabolic panel   Hemoglobin A1c   Lipid panel   Microalbumin / creatinine urine ratio   TSH     Follow-up: Return in about 3 months (around 06/03/2023) for chronic follow up.   I,Marla I Leal-Borjas,acting as a scribe for Blane Ohara, MD.,have documented all relevant documentation on the behalf of Blane Ohara, MD,as directed by  Blane Ohara, MD while in the presence of Blane Ohara, MD.   An After Visit Summary was printed and given to the patient.  I attest that I have reviewed this visit and agree with the plan scribed by my staff.   Blane Ohara, MD [facility] [phone]

## 2023-03-07 LAB — COMPREHENSIVE METABOLIC PANEL
ALT: 15 [IU]/L (ref 0–32)
AST: 17 [IU]/L (ref 0–40)
Albumin: 4.4 g/dL (ref 3.8–4.8)
Alkaline Phosphatase: 108 [IU]/L (ref 44–121)
BUN/Creatinine Ratio: 25 (ref 12–28)
BUN: 17 mg/dL (ref 8–27)
Bilirubin Total: 0.4 mg/dL (ref 0.0–1.2)
CO2: 22 mmol/L (ref 20–29)
Calcium: 10 mg/dL (ref 8.7–10.3)
Chloride: 101 mmol/L (ref 96–106)
Creatinine, Ser: 0.69 mg/dL (ref 0.57–1.00)
Globulin, Total: 2.8 g/dL (ref 1.5–4.5)
Glucose: 99 mg/dL (ref 70–99)
Potassium: 4.6 mmol/L (ref 3.5–5.2)
Sodium: 138 mmol/L (ref 134–144)
Total Protein: 7.2 g/dL (ref 6.0–8.5)
eGFR: 92 mL/min/{1.73_m2} (ref >59)

## 2023-03-07 LAB — CBC WITH DIFFERENTIAL/PLATELET
Basophils Absolute: 0.1 10*3/uL (ref 0.0–0.2)
Basos: 1 %
EOS (ABSOLUTE): 0.3 10*3/uL (ref 0.0–0.4)
Eos: 3 %
Hematocrit: 46.5 % (ref 34.0–46.6)
Hemoglobin: 15 g/dL (ref 11.1–15.9)
Immature Grans (Abs): 0 10*3/uL (ref 0.0–0.1)
Immature Granulocytes: 0 %
Lymphocytes Absolute: 3.4 10*3/uL — ABNORMAL HIGH (ref 0.7–3.1)
Lymphs: 34 %
MCH: 29.6 pg (ref 26.6–33.0)
MCHC: 32.3 g/dL (ref 31.5–35.7)
MCV: 92 fL (ref 79–97)
Monocytes Absolute: 0.6 10*3/uL (ref 0.1–0.9)
Monocytes: 6 %
Neutrophils Absolute: 5.6 10*3/uL (ref 1.4–7.0)
Neutrophils: 56 %
Platelets: 357 10*3/uL (ref 150–450)
RBC: 5.06 x10E6/uL (ref 3.77–5.28)
RDW: 13.8 % (ref 11.7–15.4)
WBC: 10.1 10*3/uL (ref 3.4–10.8)

## 2023-03-07 LAB — MICROALBUMIN / CREATININE URINE RATIO
Creatinine, Urine: 22.9 mg/dL
Microalb/Creat Ratio: 34 mg/g{creat} — ABNORMAL HIGH (ref 0–29)
Microalbumin, Urine: 7.8 ug/mL

## 2023-03-07 LAB — LIPID PANEL
Chol/HDL Ratio: 2.2 {ratio} (ref 0.0–4.4)
Cholesterol, Total: 130 mg/dL (ref 100–199)
HDL: 59 mg/dL (ref >39)
LDL Chol Calc (NIH): 49 mg/dL (ref 0–99)
Triglycerides: 126 mg/dL (ref 0–149)
VLDL Cholesterol Cal: 22 mg/dL (ref 5–40)

## 2023-03-07 LAB — HEMOGLOBIN A1C
Est. average glucose Bld gHb Est-mCnc: 151 mg/dL
Hgb A1c MFr Bld: 6.9 % — ABNORMAL HIGH (ref 4.8–5.6)

## 2023-03-07 LAB — TSH: TSH: 1.09 u[IU]/mL (ref 0.450–4.500)

## 2023-03-09 DIAGNOSIS — N184 Chronic kidney disease, stage 4 (severe): Secondary | ICD-10-CM | POA: Insufficient documentation

## 2023-03-09 NOTE — Assessment & Plan Note (Signed)
 Management per specialist.

## 2023-03-09 NOTE — Assessment & Plan Note (Signed)
Control:  good Recommend check sugars fasting daily. Recommend check feet daily. Recommend annual eye exams. Continue Rybelsus 14 mg daily, and metformin 500 mg daily Continue to work on eating a healthy diet and exercise.  Labs drawn today.

## 2023-03-09 NOTE — Assessment & Plan Note (Signed)
 Recommend continue to work on eating healthy diet and exercise.

## 2023-03-09 NOTE — Assessment & Plan Note (Signed)
Well controlled.  No changes to medicines. Rosuvastatin 10 mg daily. Continue to work on eating a healthy diet and exercise.  Labs drawn today.   

## 2023-03-09 NOTE — Assessment & Plan Note (Addendum)
Stop lisinopril. Start Losartan 25 mg daily. Hope cough improves with medication change.

## 2023-03-28 ENCOUNTER — Other Ambulatory Visit: Payer: Self-pay | Admitting: Family Medicine

## 2023-03-28 DIAGNOSIS — E1121 Type 2 diabetes mellitus with diabetic nephropathy: Secondary | ICD-10-CM

## 2023-03-31 ENCOUNTER — Other Ambulatory Visit: Payer: Self-pay

## 2023-03-31 DIAGNOSIS — E1121 Type 2 diabetes mellitus with diabetic nephropathy: Secondary | ICD-10-CM

## 2023-03-31 MED ORDER — RYBELSUS 14 MG PO TABS: 1.0000 | ORAL_TABLET | Freq: Every day | ORAL | 1 refills | Status: DC

## 2023-04-18 DIAGNOSIS — N189 Chronic kidney disease, unspecified: Secondary | ICD-10-CM | POA: Diagnosis not present

## 2023-04-18 DIAGNOSIS — E559 Vitamin D deficiency, unspecified: Secondary | ICD-10-CM | POA: Diagnosis not present

## 2023-04-22 ENCOUNTER — Other Ambulatory Visit: Payer: Self-pay | Admitting: Family Medicine

## 2023-04-22 DIAGNOSIS — E782 Mixed hyperlipidemia: Secondary | ICD-10-CM

## 2023-04-24 DIAGNOSIS — E559 Vitamin D deficiency, unspecified: Secondary | ICD-10-CM | POA: Diagnosis not present

## 2023-04-24 DIAGNOSIS — D631 Anemia in chronic kidney disease: Secondary | ICD-10-CM | POA: Diagnosis not present

## 2023-04-24 DIAGNOSIS — I10 Essential (primary) hypertension: Secondary | ICD-10-CM | POA: Diagnosis not present

## 2023-04-24 DIAGNOSIS — N189 Chronic kidney disease, unspecified: Secondary | ICD-10-CM | POA: Diagnosis not present

## 2023-04-24 DIAGNOSIS — I7782 Antineutrophilic cytoplasmic antibody (ANCA) vasculitis: Secondary | ICD-10-CM | POA: Diagnosis not present

## 2023-04-25 ENCOUNTER — Ambulatory Visit: Admitting: Pulmonary Disease

## 2023-04-25 ENCOUNTER — Encounter: Payer: Self-pay | Admitting: Pulmonary Disease

## 2023-04-25 VITALS — BP 110/80 | HR 71 | Temp 96.9°F | Ht <= 58 in | Wt 191.0 lb

## 2023-04-25 DIAGNOSIS — R053 Chronic cough: Secondary | ICD-10-CM | POA: Diagnosis not present

## 2023-04-25 MED ORDER — FLUTICASONE PROPIONATE 50 MCG/ACT NA SUSP: 1.0000 | Freq: Every day | NASAL | 2 refills | Status: DC

## 2023-04-25 MED ORDER — PANTOPRAZOLE SODIUM 40 MG PO TBEC: 40.0000 mg | DELAYED_RELEASE_TABLET | Freq: Every day | ORAL | 1 refills | Status: DC

## 2023-04-25 MED ORDER — ALBUTEROL SULFATE HFA 108 (90 BASE) MCG/ACT IN AERS: 2.0000 | INHALATION_SPRAY | Freq: Four times a day (QID) | RESPIRATORY_TRACT | 2 refills | Status: DC | PRN

## 2023-04-25 MED ORDER — FLUTICASONE-SALMETEROL 250-50 MCG/ACT IN AEPB: 1.0000 | INHALATION_SPRAY | Freq: Two times a day (BID) | RESPIRATORY_TRACT | 3 refills | Status: DC

## 2023-04-25 NOTE — Progress Notes (Signed)
 Synopsis: Referred in by Blane Ohara, MD   Subjective:   PATIENT ID: Julia Johns GENDER: female DOB: 1950/02/03, MRN: [id number]  Chief Complaint  Patient presents with   Consult    Cough in the last 3 months.  SOB when she coughs. Wheezing at night.     HPI Ms. 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   She report that post her hospitalization for ANCA vasculitis in 2020 she recovered well and has been traveling back and forth to  [place]   She had what it seems like to be a URI few months ago and now presenting with persistent pain and persistent sore throat. It is associated with intermittent wheezing.   She denies any shortness of breaht, chest pain. Does report itchy rash from time to time and seasonal allergies.   FH - No family history of pulmonary disease.   SH - Never smoker.   ROS All systems were reviewed and are negative except for the above.  Objective:   Vitals:   04/25/23 1043  BP: 110/80  Pulse: 71  Temp: (!) 96.9 F (36.1 C)  SpO2: 98%  Weight: 191 lb (86.6 kg)  Height: 4\' 9"  (1.448 m)   98% on RA BMI Readings from Last 3 Encounters:  04/25/23 41.33 kg/m  03/06/23 40.90 kg/m  11/18/22 40.90 kg/m   Wt Readings from Last 3 Encounters:  04/25/23 191 lb (86.6 kg)  03/06/23 189 lb (85.7 kg)  11/18/22 189 lb (85.7 kg)    Physical Exam GEN: NAD, Healthy Appearing HEENT: Supple Neck, Reactive Pupils, EOMI, erythematous nasal and buccal mucosae.  CVS: Normal S1, Normal S2, RRR, No murmurs or ES appreciated  Lungs: Clear bilateral air entry.  Abdomen: Soft, non tender, non distended, + BS  Extremities: Warm and well perfused, No edema  Skin: No suspicious lesions appreciated  Psych: Normal Affect  Ancillary  Information   CBC    Component Value Date/Time   WBC 10.1 03/06/2023 0850   WBC 14.3 (H) 12/27/2018 0517   RBC 5.06 03/06/2023 0850   RBC 4.62 12/27/2018 0517   HGB 15.0 03/06/2023 0850   HCT 46.5 03/06/2023 0850   PLT 357 03/06/2023 0850   MCV 92 03/06/2023 0850   MCH 29.6 03/06/2023 0850   MCH 28.8 12/27/2018 0517   MCHC 32.3 03/06/2023 0850   MCHC 32.5 12/27/2018 0517   RDW 13.8 03/06/2023 0850   LYMPHSABS 3.4 (H) 03/06/2023 0850   MONOABS 0.5 12/27/2018 0517   EOSABS 0.3 03/06/2023 0850   BASOSABS 0.1 03/06/2023 0850    Labs and imaging were reviewed.       No data to display           Assessment & Plan:  Ms. 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   #Chronic Cough  Impression that this is multifactorial in the setting of CVA, post nasal drip and some component of GERD.   []  PFTs  []  Repeat CT chest for history of ANCA  vasculitis and pulmonary involvement.  []  Allergen Panel  []  Trial ICS-LABA and assess  response.  []  Albuterol as needed.  []  Start Flonase.  []  Start Pantoprazole 40mg  po daily.    Return in about 3 months (around 07/25/2023).  I spent 60 minutes caring for this patient today, including preparing to see the patient, obtaining a medical history , reviewing a separately obtained history, performing a medically appropriate examination and/or evaluation, counseling and educating the patient/family/caregiver, ordering medications, tests, or procedures, documenting clinical information in the electronic health record, and independently interpreting results (not separately reported/billed) and communicating results to the patient/family/caregiver  Janann Colonel, MD [facility] 04/25/2023 4:48 PM

## 2023-04-28 ENCOUNTER — Other Ambulatory Visit: Payer: Self-pay

## 2023-04-28 DIAGNOSIS — R053 Chronic cough: Secondary | ICD-10-CM

## 2023-04-28 MED ORDER — FLUTICASONE PROPIONATE 50 MCG/ACT NA SUSP: 1.0000 | Freq: Every day | NASAL | 2 refills | Status: DC

## 2023-05-06 DIAGNOSIS — R053 Chronic cough: Secondary | ICD-10-CM | POA: Diagnosis not present

## 2023-05-07 DIAGNOSIS — R911 Solitary pulmonary nodule: Secondary | ICD-10-CM | POA: Diagnosis not present

## 2023-06-03 ENCOUNTER — Other Ambulatory Visit: Payer: Self-pay

## 2023-06-03 DIAGNOSIS — E1159 Type 2 diabetes mellitus with other circulatory complications: Secondary | ICD-10-CM

## 2023-06-03 MED ORDER — CHLORTHALIDONE 25 MG PO TABS
12.5000 mg | ORAL_TABLET | Freq: Every day | ORAL | 1 refills | Status: DC
Start: 2023-06-03 — End: 2023-06-09

## 2023-06-05 NOTE — Progress Notes (Deleted)
 Subjective:  Patient ID: Julia Johns, female    DOB: 1950-05-01  Age: 73 y.o. MRN: [id number]  No chief complaint on file.   HPI:    Diabetes:  Complications: Hyperlipidemia, Hypertension. Glucose checking: every other day.  Lost lancet device and needs new one. Glucose logs: 115-125 mg/dl Hypoglycemia none Most recent A1C: 6.9% Current medications: Metformin  500 mg daily, Rybelsus  14 mg daily. Last Eye Exam: 04/2022  Foot checks: checks feet daily  Hyperlipidemia: Current medications: Rosuvastatin  10 mg daily.  Hypertension: Complications:Diabetes, Hyperlipidemia. Current medications: Chlorthalidone  25 mg 1/2 tablet daily.       11/18/2022    7:58 AM 07/30/2022    7:50 AM 04/09/2022    8:09 AM 12/04/2021    7:46 AM 01/23/2021   10:13 AM  Depression screen PHQ 2/9  Decreased Interest 0 0 0 0 0  Down, Depressed, Hopeless 0 0 0 0 0  PHQ - 2 Score 0 0 0 0 0  Altered sleeping    0   Tired, decreased energy    0   Change in appetite    0   Feeling bad or failure about yourself     0   Trouble concentrating    0   Moving slowly or fidgety/restless    0   Suicidal thoughts    0   PHQ-9 Score    0   Difficult doing work/chores    Not difficult at all         03/06/2023    8:07 AM  Fall Risk   Falls in the past year? 0  Number falls in past yr: 0  Injury with Fall? 0  Risk for fall due to : No Fall Risks  Follow up Falls evaluation completed    Patient Care Team: Mercy Stall, MD as PCP - General (Family Medicine) Jeralyn Mon, MD as Referring Physician (Internal Medicine)   Review of Systems  Current Outpatient Medications on File Prior to Visit  Medication Sig Dispense Refill   ACCU-CHEK AVIVA PLUS test strip Check as directed. (Patient not taking: Reported on 04/25/2023) 100 each 3   Accu-Chek Softclix Lancets lancets Use as instructed (Patient not taking: Reported on 04/25/2023) 100 each 12   Accu-Chek Softclix Lancets lancets USE 1  TO CHECK  GLUCOSE TWICE DAILY (Patient not taking: Reported on 04/25/2023) 100 each 0   albuterol  (VENTOLIN  HFA) 108 (90 Base) MCG/ACT inhaler Inhale 2 puffs into the lungs every 6 (six) hours as needed for wheezing or shortness of breath. 8 g 2   Blood Glucose Monitoring Suppl (ACCU-CHEK AVIVA PLUS) w/Device KIT Check Blood Sugar fasting every morning. (Patient not taking: Reported on 04/25/2023) 1 kit 0   cetirizine  (ZYRTEC ) 10 MG tablet Take 1 tablet (10 mg total) by mouth daily. 90 tablet 3   chlorthalidone  (HYGROTON ) 25 MG tablet Take 0.5 tablets (12.5 mg total) by mouth daily. 45 tablet 1   diphenhydrAMINE  (BENADRYL ) 25 MG tablet Take 25 mg by mouth 2 (two) times daily as needed.     fexofenadine (ALLEGRA) 180 MG tablet Take 2 tablets by mouth daily.     fluticasone  (FLONASE ) 50 MCG/ACT nasal spray Place 1 spray into both nostrils daily. 16 mL 2   fluticasone -salmeterol (WIXELA INHUB) 250-50 MCG/ACT AEPB Inhale 1 puff into the lungs in the morning and at bedtime. 60 each 3   glucose blood (ACCU-CHEK AVIVA PLUS) test strip Use as instructed (Patient not taking: Reported on 04/25/2023) 100 each 12  Lancet Device MISC Use as directed to check sugar daily. (Patient not taking: Reported on 04/25/2023) 1 each 0   lisinopril  (ZESTRIL ) 10 MG tablet Take 1 tablet by mouth once daily 90 tablet 0   losartan  (COZAAR ) 25 MG tablet Take 1 tablet (25 mg total) by mouth daily. (Patient not taking: Reported on 04/25/2023) 90 tablet 1   metFORMIN  (GLUCOPHAGE ) 500 MG tablet TAKE 1 TABLET BY MOUTH ONCE DAILY AFTER  THE  BIGGEST  MEAL 90 tablet 1   pantoprazole  (PROTONIX ) 40 MG tablet Take 1 tablet (40 mg total) by mouth daily. 30 tablet 1   rosuvastatin  (CRESTOR ) 10 MG tablet Take 1 tablet by mouth once daily 90 tablet 0   Semaglutide  (RYBELSUS ) 14 MG TABS Take 1 tablet (14 mg total) by mouth daily. 90 tablet 1   No current facility-administered medications on file prior to visit.   Past Medical History:  Diagnosis Date    Chronic kidney disease    Chronic respiratory failure with hypoxia (HCC)    10/09/2018-overnight oximetry- duration of sleep 7 hours and 34 minutes, on room air, SPO2 less than 88% 1 hour and 34 minutes and 28 seconds.    Diabetes mellitus without complication (HCC)    Grade II hemorrhoids 07/03/2017   Last Assessment & Plan:  Formatting of this note might be different from the original. Fiber supplement stool softener adequate hydration and local hemorrhoid management is advisable.   Vasculitis (HCC)    Vasculitis, ANCA positive (HCC)    Past Surgical History:  Procedure Laterality Date   VIDEO BRONCHOSCOPY Bilateral 06/18/2018   Procedure: VIDEO BRONCHOSCOPY WITH FLUORO;  Surgeon: Lind Repine, MD;  Location: [facility] ENDOSCOPY;  Service: Cardiopulmonary;  Laterality: Bilateral;   VIDEO BRONCHOSCOPY WITH ENDOBRONCHIAL ULTRASOUND N/A 06/25/2018   Procedure: VIDEO BRONCHOSCOPY WITH ENDOBRONCHIAL ULTRASOUND;  Surgeon: Lind Repine, MD;  Location: [facility] OR;  Service: Thoracic;  Laterality: N/A;    Family History  Problem Relation Age of Onset   Uterine cancer Mother    Breast cancer Neg Hx    Social History   Socioeconomic History   Marital status: Single    Spouse name: Not on file   Number of children: Not on file   Years of education: Not on file   Highest education level: Not on file  Occupational History   Not on file  Tobacco Use   Smoking status: Never   Smokeless tobacco: Never  Substance and Sexual Activity   Alcohol use: Not on file   Drug use: Never   Sexual activity: Not Currently  Other Topics Concern   Not on file  Social History Narrative   Patient primary language is spanish, she lives at home with her son Julia Johns and daughter in law Julia Johns    Social Drivers of Health   Financial Resource Strain: Low Risk  (07/30/2022)   Overall Financial Resource Strain (CARDIA)    Difficulty of Paying Living Expenses: Not hard at all  Food Insecurity: No Food  Insecurity (07/30/2022)   Hunger Vital Sign    Worried About Running Out of Food in the Last Year: Never true    Ran Out of Food in the Last Year: Never true  Transportation Needs: No Transportation Needs (07/30/2022)   PRAPARE - Administrator, Civil Service (Medical): No    Lack of Transportation (Non-Medical): No  Physical Activity: Inactive (07/30/2022)   Exercise Vital Sign    Days of Exercise per Week: 0  days    Minutes of Exercise per Session: 0 min  Stress: No Stress Concern Present (07/30/2022)   [employer] of Occupational Health - Occupational Stress Questionnaire    Feeling of Stress : Not at all  Social Connections: Moderately Isolated (07/30/2022)   Social Connection and Isolation Panel [NHANES]    Frequency of Communication with Friends and Family: Never    Frequency of Social Gatherings with Friends and Family: More than three times a week    Attends Religious Services: Never    Database administrator or Organizations: Yes    Attends Banker Meetings: Never    Marital Status: Widowed    Objective:  There were no vitals taken for this visit.     04/25/2023   10:43 AM 03/06/2023    7:59 AM 11/18/2022    7:54 AM  BP/Weight  Systolic BP 110 128 134  Diastolic BP 80 74 74  Wt. (Lbs) 191 189 189  BMI 41.33 kg/m2 40.9 kg/m2 40.9 kg/m2    Physical Exam  Diabetic Foot Exam - Simple   No data filed      Lab Results  Component Value Date   WBC 10.1 03/06/2023   HGB 15.0 03/06/2023   HCT 46.5 03/06/2023   PLT 357 03/06/2023   GLUCOSE 99 03/06/2023   CHOL 130 03/06/2023   TRIG 126 03/06/2023   HDL 59 03/06/2023   LDLCALC 49 03/06/2023   ALT 15 03/06/2023   AST 17 03/06/2023   NA 138 03/06/2023   K 4.6 03/06/2023   CL 101 03/06/2023   CREATININE 0.69 03/06/2023   BUN 17 03/06/2023   CO2 22 03/06/2023   TSH 1.090 03/06/2023   INR 1.2 07/05/2018   HGBA1C 6.9 (H) 03/06/2023   MICROALBUR 30 04/18/2020      Assessment & Plan:   There are no diagnoses linked to this encounter.   No orders of the defined types were placed in this encounter.   No orders of the defined types were placed in this encounter.    Follow-up: No follow-ups on file.   I,Danee Soller I Leal-Borjas,acting as a scribe for Mercy Stall, MD.,have documented all relevant documentation on the behalf of Mercy Stall, MD,as directed by  Mercy Stall, MD while in the presence of Mercy Stall, MD.   An After Visit Summary was printed and given to the patient.  Mercy Stall, MD [facility] [phone]

## 2023-06-06 ENCOUNTER — Encounter: Payer: Medicare HMO | Admitting: Family Medicine

## 2023-06-06 DIAGNOSIS — I10 Essential (primary) hypertension: Secondary | ICD-10-CM

## 2023-06-06 DIAGNOSIS — E782 Mixed hyperlipidemia: Secondary | ICD-10-CM

## 2023-06-06 DIAGNOSIS — E1121 Type 2 diabetes mellitus with diabetic nephropathy: Secondary | ICD-10-CM

## 2023-06-08 NOTE — Progress Notes (Signed)
 Subjective:  Patient ID: Julia Johns, female    DOB: Oct 21, 1950  Age: 73 y.o. MRN: [id number]  Chief Complaint  Patient presents with   Medical Management of Chronic Issues    HPI: Diabetes:  Complications: Hyperlipidemia, Hypertension. Glucose checking: she is not checking her glucose because she lost glucometer. Glucose logs: n/a Hypoglycemia none Most recent A1C: 6.9 Current medications: Metformin  500 mg daily, Rybelsus  14 mg daily. Last Eye Exam: 04/2022  Foot checks: checks feet daily  Hyperlipidemia: Current medications: Rosuvastatin  10 mg daily.  Hypertension: Complications:Diabetes, Hyperlipidemia. Current medications: Lisinopril  10 mg daily.     11/18/2022    7:58 AM 07/30/2022    7:50 AM 04/09/2022    8:09 AM 12/04/2021    7:46 AM 01/23/2021   10:13 AM  Depression screen PHQ 2/9  Decreased Interest 0 0 0 0 0  Down, Depressed, Hopeless 0 0 0 0 0  PHQ - 2 Score 0 0 0 0 0  Altered sleeping    0   Tired, decreased energy    0   Change in appetite    0   Feeling bad or failure about yourself     0   Trouble concentrating    0   Moving slowly or fidgety/restless    0   Suicidal thoughts    0   PHQ-9 Score    0   Difficult doing work/chores    Not difficult at all         03/06/2023    8:07 AM  Fall Risk   Falls in the past year? 0  Number falls in past yr: 0  Injury with Fall? 0  Risk for fall due to : No Fall Risks  Follow up Falls evaluation completed    Patient Care Team: Mercy Stall, MD as PCP - General (Family Medicine) Jeralyn Mon, MD as Referring Physician (Internal Medicine)   Review of Systems  Constitutional:  Negative for chills, fatigue and fever.  HENT:  Negative for congestion, ear pain and sore throat.   Respiratory:  Positive for cough. Negative for shortness of breath.   Cardiovascular:  Negative for chest pain and palpitations.  Gastrointestinal:  Negative for abdominal pain, constipation, diarrhea, nausea and  vomiting.  Endocrine: Negative for polydipsia, polyphagia and polyuria.  Genitourinary:  Negative for difficulty urinating and dysuria.  Musculoskeletal:  Positive for back pain. Negative for arthralgias and myalgias.  Skin:  Negative for rash.  Neurological:  Negative for headaches.  Psychiatric/Behavioral:  Negative for dysphoric mood. The patient is not nervous/anxious.     Current Outpatient Medications on File Prior to Visit  Medication Sig Dispense Refill   albuterol  (VENTOLIN  HFA) 108 (90 Base) MCG/ACT inhaler Inhale 2 puffs into the lungs every 6 (six) hours as needed for wheezing or shortness of breath. 8 g 2   fluticasone  (FLONASE ) 50 MCG/ACT nasal spray Place 1 spray into both nostrils daily. 16 mL 2   fluticasone -salmeterol (WIXELA INHUB) 250-50 MCG/ACT AEPB Inhale 1 puff into the lungs in the morning and at bedtime. 60 each 3   metFORMIN  (GLUCOPHAGE ) 500 MG tablet TAKE 1 TABLET BY MOUTH ONCE DAILY AFTER  THE  BIGGEST  MEAL 90 tablet 1   rosuvastatin  (CRESTOR ) 10 MG tablet Take 1 tablet by mouth once daily 90 tablet 0   Semaglutide  (RYBELSUS ) 14 MG TABS Take 1 tablet (14 mg total) by mouth daily. 90 tablet 1   Blood Glucose Monitoring Suppl (ACCU-CHEK AVIVA PLUS) w/Device KIT  Check Blood Sugar fasting every morning. (Patient not taking: Reported on 04/25/2023) 1 kit 0   glucose blood (ACCU-CHEK AVIVA PLUS) test strip Use as instructed (Patient not taking: Reported on 06/09/2023) 100 each 12   Lancet Device MISC Use as directed to check sugar daily. (Patient not taking: Reported on 06/09/2023) 1 each 0   No current facility-administered medications on file prior to visit.   Past Medical History:  Diagnosis Date   Chronic kidney disease    Chronic respiratory failure with hypoxia (HCC)    10/09/2018-overnight oximetry- duration of sleep 7 hours and 34 minutes, on room air, SPO2 less than 88% 1 hour and 34 minutes and 28 seconds.    Diabetes mellitus without complication (HCC)     Grade II hemorrhoids 07/03/2017   Last Assessment & Plan:  Formatting of this note might be different from the original. Fiber supplement stool softener adequate hydration and local hemorrhoid management is advisable.   Vasculitis (HCC)    Vasculitis, ANCA positive (HCC)    Past Surgical History:  Procedure Laterality Date   VIDEO BRONCHOSCOPY Bilateral 06/18/2018   Procedure: VIDEO BRONCHOSCOPY WITH FLUORO;  Surgeon: Lind Repine, MD;  Location: [facility] ENDOSCOPY;  Service: Cardiopulmonary;  Laterality: Bilateral;   VIDEO BRONCHOSCOPY WITH ENDOBRONCHIAL ULTRASOUND N/A 06/25/2018   Procedure: VIDEO BRONCHOSCOPY WITH ENDOBRONCHIAL ULTRASOUND;  Surgeon: Lind Repine, MD;  Location: [facility] OR;  Service: Thoracic;  Laterality: N/A;    Family History  Problem Relation Age of Onset   Uterine cancer Mother    Breast cancer Neg Hx    Social History   Socioeconomic History   Marital status: Single    Spouse name: Not on file   Number of children: Not on file   Years of education: Not on file   Highest education level: Not on file  Occupational History   Not on file  Tobacco Use   Smoking status: Never   Smokeless tobacco: Never  Vaping Use   Vaping status: Never Used  Substance and Sexual Activity   Alcohol use: Never   Drug use: Never   Sexual activity: Not Currently  Other Topics Concern   Not on file  Social History Narrative   Patient primary language is spanish, she lives at home with her son Wilhelmenia Harada and daughter in law [name]    Social Drivers of Health   Financial Resource Strain: Low Risk  (07/30/2022)   Overall Financial Resource Strain (CARDIA)    Difficulty of Paying Living Expenses: Not hard at all  Food Insecurity: No Food Insecurity (07/30/2022)   Hunger Vital Sign    Worried About Running Out of Food in the Last Year: Never true    Ran Out of Food in the Last Year: Never true  Transportation Needs: No Transportation Needs (07/30/2022)   PRAPARE - Therapist, art (Medical): No    Lack of Transportation (Non-Medical): No  Physical Activity: Inactive (07/30/2022)   Exercise Vital Sign    Days of Exercise per Week: 0 days    Minutes of Exercise per Session: 0 min  Stress: No Stress Concern Present (07/30/2022)   [employer] of Occupational Health - Occupational Stress Questionnaire    Feeling of Stress : Not at all  Social Connections: Moderately Isolated (07/30/2022)   Social Connection and Isolation Panel [NHANES]    Frequency of Communication with Friends and Family: Never    Frequency of Social Gatherings with Friends and Family: More  than three times a week    Attends Religious Services: Never    Active Member of Clubs or Organizations: Yes    Attends Banker Meetings: Never    Marital Status: Widowed    Objective:  BP 120/60   Pulse 77   Temp (!) 97.4 F (36.3 C)   Resp 14   Ht 4\' 9"  (1.448 m)   Wt 187 lb (84.8 kg)   SpO2 98%   BMI 40.47 kg/m      06/09/2023    8:44 AM 04/25/2023   10:43 AM 03/06/2023    7:59 AM  BP/Weight  Systolic BP 120 110 128  Diastolic BP 60 80 74  Wt. (Lbs) 187 191 189  BMI 40.47 kg/m2 41.33 kg/m2 40.9 kg/m2    Physical Exam Vitals reviewed.  Constitutional:      Appearance: Normal appearance. She is obese.  HENT:     Nose: No congestion or rhinorrhea.     Mouth/Throat:     Pharynx: No oropharyngeal exudate or posterior oropharyngeal erythema.  Neck:     Vascular: No carotid bruit.  Cardiovascular:     Rate and Rhythm: Normal rate and regular rhythm.     Heart sounds: Normal heart sounds.  Pulmonary:     Effort: Pulmonary effort is normal. No respiratory distress.     Breath sounds: Normal breath sounds.  Abdominal:     General: Abdomen is flat. Bowel sounds are normal.     Palpations: Abdomen is soft.     Tenderness: There is no abdominal tenderness.  Neurological:     Mental Status: She is alert and oriented to person, place, and time.   Psychiatric:        Mood and Affect: Mood normal.        Behavior: Behavior normal.     Diabetic Foot Exam - Simple   Simple Foot Form  06/09/2023 11:42 PM  Visual Inspection No deformities, no ulcerations, no other skin breakdown bilaterally: Yes Sensation Testing Intact to touch and monofilament testing bilaterally: Yes Pulse Check Posterior Tibialis and Dorsalis pulse intact bilaterally: Yes Comments      Lab Results  Component Value Date   WBC 8.6 06/09/2023   HGB 13.6 06/09/2023   HCT 43.7 06/09/2023   PLT 381 06/09/2023   GLUCOSE 99 06/09/2023   CHOL 135 06/09/2023   TRIG 135 06/09/2023   HDL 64 06/09/2023   LDLCALC 48 06/09/2023   ALT 13 06/09/2023   AST 17 06/09/2023   NA 140 06/09/2023   K 5.3 (H) 06/09/2023   CL 103 06/09/2023   CREATININE 0.60 06/09/2023   BUN 13 06/09/2023   CO2 19 (L) 06/09/2023   TSH 1.090 03/06/2023   INR 1.2 07/05/2018   HGBA1C 6.4 (H) 06/09/2023   MICROALBUR 30 04/18/2020      Assessment & Plan:  Essential hypertension, benign Assessment & Plan: Stop lisinopril . Start Losartan  25 mg daily. Hope cough improves with medication change.    Orders: -     CBC with Differential/Platelet -     Comprehensive metabolic panel with GFR  Diabetic glomerulopathy (HCC) Assessment & Plan: Control:  good Recommend check sugars fasting daily. Recommend check feet daily. Recommend annual eye exams. Continue Rybelsus  14 mg daily, and metformin  500 mg daily Continue to work on eating a healthy diet and exercise.  Labs drawn today.     Orders: -     Hemoglobin A1c -     Microalbumin / creatinine  urine ratio -     POCT Glucose Fingerstick  Mixed hyperlipidemia Assessment & Plan: Well controlled.  No changes to medicines. Rosuvastatin  10 mg daily. Continue to work on eating a healthy diet and exercise.  Labs drawn today.    Orders: -     Lipid panel  Lumbar back pain Assessment & Plan: Order lumbar xray.  Use tylenol  for  back pain.   Orders: -     DG Lumbar Spine Complete; Future  Chronic cough Assessment & Plan: Hold lisinopril .    Morbid obesity with BMI of 40.0-44.9, adult [facility]) Assessment & Plan: Recommend continue to work on eating healthy diet and exercise.    ANCA-associated vasculitis (HCC) Assessment & Plan: Greatly improved.       No orders of the defined types were placed in this encounter.   Orders Placed This Encounter  Procedures   DG Lumbar Spine Complete   CBC with Differential/Platelet   Comprehensive metabolic panel with GFR   Hemoglobin A1c   Lipid panel   Microalbumin / creatinine urine ratio   POCT Glucose Fingerstick     Follow-up: Return in about 2 weeks (around 06/23/2023) for back pain, cough.   I,Marla I Leal-Borjas,acting as a scribe for Mercy Stall, MD.,have documented all relevant documentation on the behalf of Mercy Stall, MD,as directed by  Mercy Stall, MD while in the presence of Mercy Stall, MD.   An After Visit Summary was printed and given to the patient.  I attest that I have reviewed this visit and agree with the plan scribed by my staff.   Mercy Stall, MD [facility] [phone]

## 2023-06-08 NOTE — Progress Notes (Signed)
 This encounter was created in error - please disregard.

## 2023-06-09 ENCOUNTER — Encounter: Payer: Self-pay | Admitting: Family Medicine

## 2023-06-09 ENCOUNTER — Ambulatory Visit (INDEPENDENT_AMBULATORY_CARE_PROVIDER_SITE_OTHER): Admitting: Family Medicine

## 2023-06-09 ENCOUNTER — Ambulatory Visit (HOSPITAL_BASED_OUTPATIENT_CLINIC_OR_DEPARTMENT_OTHER)
Admission: RE | Admit: 2023-06-09 | Discharge: 2023-06-09 | Disposition: A | Discharge status: Home / Self Care | Source: Ambulatory Visit | Attending: Pulmonary Disease | Admitting: Pulmonary Disease

## 2023-06-09 ENCOUNTER — Ambulatory Visit (HOSPITAL_BASED_OUTPATIENT_CLINIC_OR_DEPARTMENT_OTHER)
Admission: RE | Admit: 2023-06-09 | Discharge: 2023-06-09 | Disposition: A | Discharge status: Home / Self Care | Source: Ambulatory Visit | Attending: Family Medicine | Admitting: Family Medicine

## 2023-06-09 VITALS — BP 120/60 | HR 77 | Temp 97.4°F | Resp 14 | Ht <= 58 in | Wt 187.0 lb

## 2023-06-09 DIAGNOSIS — R918 Other nonspecific abnormal finding of lung field: Secondary | ICD-10-CM

## 2023-06-09 DIAGNOSIS — M545 Low back pain, unspecified: Secondary | ICD-10-CM

## 2023-06-09 DIAGNOSIS — J40 Bronchitis, not specified as acute or chronic: Secondary | ICD-10-CM | POA: Diagnosis not present

## 2023-06-09 DIAGNOSIS — R053 Chronic cough: Secondary | ICD-10-CM

## 2023-06-09 DIAGNOSIS — I7782 Antineutrophilic cytoplasmic antibody (ANCA) vasculitis: Secondary | ICD-10-CM | POA: Diagnosis not present

## 2023-06-09 DIAGNOSIS — M4726 Other spondylosis with radiculopathy, lumbar region: Secondary | ICD-10-CM | POA: Diagnosis not present

## 2023-06-09 DIAGNOSIS — M4316 Spondylolisthesis, lumbar region: Secondary | ICD-10-CM | POA: Diagnosis not present

## 2023-06-09 DIAGNOSIS — E1121 Type 2 diabetes mellitus with diabetic nephropathy: Secondary | ICD-10-CM

## 2023-06-09 DIAGNOSIS — I10 Essential (primary) hypertension: Secondary | ICD-10-CM

## 2023-06-09 DIAGNOSIS — J432 Centrilobular emphysema: Secondary | ICD-10-CM | POA: Diagnosis not present

## 2023-06-09 DIAGNOSIS — Z6841 Body Mass Index (BMI) 40.0 and over, adult: Secondary | ICD-10-CM | POA: Diagnosis not present

## 2023-06-09 DIAGNOSIS — E782 Mixed hyperlipidemia: Secondary | ICD-10-CM | POA: Diagnosis not present

## 2023-06-09 LAB — POCT GLUCOSE FINGERSTICK: Glucose: 115 — AB (ref 70–99)

## 2023-06-09 NOTE — Assessment & Plan Note (Signed)
Well controlled.  No changes to medicines. Rosuvastatin 10 mg daily. Continue to work on eating a healthy diet and exercise.  Labs drawn today.   

## 2023-06-09 NOTE — Assessment & Plan Note (Signed)
 Stop lisinopril. Start Losartan 25 mg daily. Hope cough improves with medication change.

## 2023-06-09 NOTE — Patient Instructions (Addendum)
 Hold lisinopril  and have her come back for nurse visit for bp in 1-2 weeks.   Order lumbar xray.  Use tylenol  for back pain.   [facility].  [address], [place], [place] [address]

## 2023-06-10 ENCOUNTER — Ambulatory Visit: Payer: Self-pay | Admitting: Family Medicine

## 2023-06-10 DIAGNOSIS — M545 Low back pain, unspecified: Secondary | ICD-10-CM | POA: Insufficient documentation

## 2023-06-10 LAB — CBC WITH DIFFERENTIAL/PLATELET
Basophils Absolute: 0.1 10*3/uL (ref 0.0–0.2)
Basos: 1 %
EOS (ABSOLUTE): 0.3 10*3/uL (ref 0.0–0.4)
Eos: 4 %
Hematocrit: 43.7 % (ref 34.0–46.6)
Hemoglobin: 13.6 g/dL (ref 11.1–15.9)
Immature Grans (Abs): 0 10*3/uL (ref 0.0–0.1)
Immature Granulocytes: 0 %
Lymphocytes Absolute: 2.6 10*3/uL (ref 0.7–3.1)
Lymphs: 30 %
MCH: 28.8 pg (ref 26.6–33.0)
MCHC: 31.1 g/dL — ABNORMAL LOW (ref 31.5–35.7)
MCV: 93 fL (ref 79–97)
Monocytes Absolute: 0.5 10*3/uL (ref 0.1–0.9)
Monocytes: 6 %
Neutrophils Absolute: 5.1 10*3/uL (ref 1.4–7.0)
Neutrophils: 59 %
Platelets: 381 10*3/uL (ref 150–450)
RBC: 4.72 x10E6/uL (ref 3.77–5.28)
RDW: 13.2 % (ref 11.7–15.4)
WBC: 8.6 10*3/uL (ref 3.4–10.8)

## 2023-06-10 LAB — LIPID PANEL
Chol/HDL Ratio: 2.1 ratio (ref 0.0–4.4)
Cholesterol, Total: 135 mg/dL (ref 100–199)
HDL: 64 mg/dL (ref >39)
LDL Chol Calc (NIH): 48 mg/dL (ref 0–99)
Triglycerides: 135 mg/dL (ref 0–149)
VLDL Cholesterol Cal: 23 mg/dL (ref 5–40)

## 2023-06-10 LAB — COMPREHENSIVE METABOLIC PANEL WITH GFR
ALT: 13 IU/L (ref 0–32)
AST: 17 IU/L (ref 0–40)
Albumin: 4.2 g/dL (ref 3.8–4.8)
Alkaline Phosphatase: 98 IU/L (ref 44–121)
BUN/Creatinine Ratio: 22 (ref 12–28)
BUN: 13 mg/dL (ref 8–27)
Bilirubin Total: 0.4 mg/dL (ref 0.0–1.2)
CO2: 19 mmol/L — ABNORMAL LOW (ref 20–29)
Calcium: 9.5 mg/dL (ref 8.7–10.3)
Chloride: 103 mmol/L (ref 96–106)
Creatinine, Ser: 0.6 mg/dL (ref 0.57–1.00)
Globulin, Total: 2.8 g/dL (ref 1.5–4.5)
Glucose: 99 mg/dL (ref 70–99)
Potassium: 5.3 mmol/L — ABNORMAL HIGH (ref 3.5–5.2)
Sodium: 140 mmol/L (ref 134–144)
Total Protein: 7 g/dL (ref 6.0–8.5)
eGFR: 95 mL/min/{1.73_m2} (ref >59)

## 2023-06-10 LAB — MICROALBUMIN / CREATININE URINE RATIO
Creatinine, Urine: 19.4 mg/dL
Microalb/Creat Ratio: 62 mg/g{creat} — ABNORMAL HIGH (ref 0–29)
Microalbumin, Urine: 12 ug/mL

## 2023-06-10 LAB — HEMOGLOBIN A1C
Est. average glucose Bld gHb Est-mCnc: 137 mg/dL
Hgb A1c MFr Bld: 6.4 % — ABNORMAL HIGH (ref 4.8–5.6)

## 2023-06-10 NOTE — Assessment & Plan Note (Signed)
 Control:  good Recommend check sugars fasting daily. Recommend check feet daily. Recommend annual eye exams. Continue Rybelsus 14 mg daily, and metformin 500 mg daily Continue to work on eating a healthy diet and exercise.  Labs drawn today.

## 2023-06-10 NOTE — Assessment & Plan Note (Addendum)
 Hold lisinopril

## 2023-06-10 NOTE — Assessment & Plan Note (Signed)
 Order lumbar xray.  Use tylenol  for back pain.

## 2023-06-11 MED ORDER — LOSARTAN POTASSIUM 50 MG PO TABS: 50.0000 mg | ORAL_TABLET | Freq: Every day | ORAL | 1 refills | Status: DC

## 2023-06-11 MED ORDER — EMPAGLIFLOZIN 10 MG PO TABS: 10.0000 mg | ORAL_TABLET | Freq: Every day | ORAL | 1 refills | Status: DC

## 2023-06-11 NOTE — Addendum Note (Signed)
 Addended by: Allana Ishikawa C on: 06/11/2023 09:53 AM   Modules accepted: Orders

## 2023-06-14 ENCOUNTER — Encounter: Payer: Self-pay | Admitting: Family Medicine

## 2023-06-14 NOTE — Assessment & Plan Note (Signed)
Greatly improved.  

## 2023-06-14 NOTE — Assessment & Plan Note (Signed)
 Recommend continue to work on eating healthy diet and exercise.

## 2023-06-17 ENCOUNTER — Other Ambulatory Visit: Payer: Self-pay

## 2023-06-17 DIAGNOSIS — M545 Low back pain, unspecified: Secondary | ICD-10-CM

## 2023-06-24 ENCOUNTER — Ambulatory Visit (INDEPENDENT_AMBULATORY_CARE_PROVIDER_SITE_OTHER): Admitting: Family Medicine

## 2023-06-24 VITALS — BP 124/74 | HR 78 | Ht <= 58 in | Wt 184.0 lb

## 2023-06-24 DIAGNOSIS — M545 Low back pain, unspecified: Secondary | ICD-10-CM | POA: Diagnosis not present

## 2023-06-24 DIAGNOSIS — R053 Chronic cough: Secondary | ICD-10-CM | POA: Diagnosis not present

## 2023-06-24 DIAGNOSIS — E1159 Type 2 diabetes mellitus with other circulatory complications: Secondary | ICD-10-CM

## 2023-06-24 DIAGNOSIS — I152 Hypertension secondary to endocrine disorders: Secondary | ICD-10-CM

## 2023-06-24 DIAGNOSIS — E1121 Type 2 diabetes mellitus with diabetic nephropathy: Secondary | ICD-10-CM | POA: Diagnosis not present

## 2023-06-24 MED ORDER — METFORMIN HCL 500 MG PO TABS: ORAL_TABLET | ORAL | 1 refills | Status: DC

## 2023-06-24 MED ORDER — LANCET DEVICE MISC: 0 refills | Status: AC

## 2023-06-24 NOTE — Progress Notes (Signed)
 Acute Office Visit  Subjective:    Patient ID: Julia Johns, female    DOB: 09/18/1950, 73 y.o.   MRN: [id number]  Chief Complaint  Patient presents with   Medical Management of Chronic Issues    HPI: Patient is in today for back pain and cough. States that both are improving. I changed her lisinopril  to losartan  50 mg daily. Bp is well controlled. The cough has nearly resolved. Her back pain continues and she has not heard from physical therapy.   Past Medical History:  Diagnosis Date   Chronic kidney disease    Chronic respiratory failure with hypoxia (HCC)    10/09/2018-overnight oximetry- duration of sleep 7 hours and 34 minutes, on room air, SPO2 less than 88% 1 hour and 34 minutes and 28 seconds.    Diabetes mellitus without complication (HCC)    Grade II hemorrhoids 07/03/2017   Last Assessment & Plan:  Formatting of this note might be different from the original. Fiber supplement stool softener adequate hydration and local hemorrhoid management is advisable.   Vasculitis (HCC)    Vasculitis, ANCA positive (HCC)     Past Surgical History:  Procedure Laterality Date   VIDEO BRONCHOSCOPY Bilateral 06/18/2018   Procedure: VIDEO BRONCHOSCOPY WITH FLUORO;  Surgeon: Lind Repine, MD;  [facility] ENDOSCOPY;  Service: Cardiopulmonary;  Laterality: Bilateral;   VIDEO BRONCHOSCOPY WITH ENDOBRONCHIAL ULTRASOUND N/A 06/25/2018   Procedure: VIDEO BRONCHOSCOPY WITH ENDOBRONCHIAL ULTRASOUND;  Surgeon: Lind Repine, MD;  Location: [facility] OR;  Service: Thoracic;  Laterality: N/A;    Family History  Problem Relation Age of Onset   Uterine cancer Mother    Breast cancer Neg Hx     Social History   Socioeconomic History   Marital status: Single    Spouse name: Not on file   Number of children: Not on file   Years of education: Not on file   Highest education level: Not on file  Occupational History   Not on file  Tobacco Use   Smoking status: Never   Smokeless  tobacco: Never  Vaping Use   Vaping status: Never Used  Substance and Sexual Activity   Alcohol use: Never   Drug use: Never   Sexual activity: Not Currently  Other Topics Concern   Not on file  Social History Narrative   Patient primary language is spanish, she lives at home with her son Wilhelmenia Harada and daughter in law [name]    Social Drivers of Health   Financial Resource Strain: Low Risk  (07/30/2022)   Overall Financial Resource Strain (CARDIA)    Difficulty of Paying Living Expenses: Not hard at all  Food Insecurity: No Food Insecurity (07/30/2022)   Hunger Vital Sign    Worried About Running Out of Food in the Last Year: Never true    Ran Out of Food in the Last Year: Never true  Transportation Needs: No Transportation Needs (07/30/2022)   PRAPARE - Administrator, Civil Service (Medical): No    Lack of Transportation (Non-Medical): No  Physical Activity: Inactive (07/30/2022)   Exercise Vital Sign    Days of Exercise per Week: 0 days    Minutes of Exercise per Session: 0 min  Stress: No Stress Concern Present (07/30/2022)   [employer] of Occupational Health - Occupational Stress Questionnaire    Feeling of Stress : Not at all  Social Connections: Moderately Isolated (07/30/2022)   Social Connection and Isolation Panel [NHANES]  Frequency of Communication with Friends and Family: Never    Frequency of Social Gatherings with Friends and Family: More than three times a week    Attends Religious Services: Never    Database administrator or Organizations: Yes    Attends Banker Meetings: Never    Marital Status: Widowed  Intimate Partner Violence: Not At Risk (07/30/2022)   Humiliation, Afraid, Rape, and Kick questionnaire    Fear of Current or Ex-Partner: No    Emotionally Abused: No    Physically Abused: No    Sexually Abused: No    Outpatient Medications Prior to Visit  Medication Sig Dispense Refill   cetirizine  (ZYRTEC ) 10 MG  tablet Take 10 mg by mouth daily.     albuterol  (VENTOLIN  HFA) 108 (90 Base) MCG/ACT inhaler Inhale 2 puffs into the lungs every 6 (six) hours as needed for wheezing or shortness of breath. 8 g 2   Blood Glucose Monitoring Suppl (ACCU-CHEK AVIVA PLUS) w/Device KIT Check Blood Sugar fasting every morning. (Patient not taking: Reported on 04/25/2023) 1 kit 0   empagliflozin  (JARDIANCE ) 10 MG TABS tablet Take 1 tablet (10 mg total) by mouth daily before breakfast. 90 tablet 1   fluticasone  (FLONASE ) 50 MCG/ACT nasal spray Place 1 spray into both nostrils daily. 16 mL 2   fluticasone -salmeterol (WIXELA INHUB) 250-50 MCG/ACT AEPB Inhale 1 puff into the lungs in the morning and at bedtime. 60 each 3   glucose blood (ACCU-CHEK AVIVA PLUS) test strip Use as instructed (Patient not taking: Reported on 06/09/2023) 100 each 12   losartan  (COZAAR ) 50 MG tablet Take 1 tablet (50 mg total) by mouth daily. 90 tablet 1   rosuvastatin  (CRESTOR ) 10 MG tablet Take 1 tablet by mouth once daily 90 tablet 0   Semaglutide  (RYBELSUS ) 14 MG TABS Take 1 tablet (14 mg total) by mouth daily. 90 tablet 1   Lancet Device MISC Use as directed to check sugar daily. (Patient not taking: Reported on 06/09/2023) 1 each 0   metFORMIN  (GLUCOPHAGE ) 500 MG tablet TAKE 1 TABLET BY MOUTH ONCE DAILY AFTER  THE  BIGGEST  MEAL 90 tablet 1   No facility-administered medications prior to visit.    Allergies  Allergen Reactions   Penicillins Anaphylaxis    Patient stated that within a few minutes of receiving a penicillin IM injection she rapidly became unconscious and required "additional" medication to recover. This occurred when she was ~73 years old and she does not recall what was given to assist in her recovery. She did not develop a rash.    Tetracycline Anaphylaxis    Loss of consciousness after injection     Review of Systems  Constitutional:  Negative for chills, fatigue and fever.  HENT:  Negative for congestion, ear pain,  rhinorrhea and sore throat.   Respiratory:  Positive for cough. Negative for shortness of breath.   Cardiovascular:  Negative for chest pain.  Gastrointestinal:  Negative for abdominal pain, constipation, diarrhea, nausea and vomiting.  Genitourinary:  Negative for dysuria and urgency.  Musculoskeletal:  Positive for back pain. Negative for myalgias.  Neurological:  Negative for dizziness, weakness, light-headedness and headaches.  Psychiatric/Behavioral:  Negative for dysphoric mood. The patient is not nervous/anxious.        Objective:         06/24/2023   10:00 AM 06/09/2023    8:44 AM 04/25/2023   10:43 AM  Vitals with BMI  Height 4\' 9"  4\' 9"  4\' 9"   Weight 184 lbs 187 lbs 191 lbs  BMI 39.81 40.46 41.32  Systolic 124 120 161  Diastolic 74 60 80  Pulse 78 77 71    No data found.   Physical Exam Vitals reviewed.  Constitutional:      Appearance: Normal appearance. She is obese.  Cardiovascular:     Rate and Rhythm: Normal rate and regular rhythm.     Heart sounds: Normal heart sounds.  Pulmonary:     Effort: Pulmonary effort is normal. No respiratory distress.     Breath sounds: Normal breath sounds.  Musculoskeletal:        General: Tenderness (lumbar midline and lumbar paraspinal muscles.) present.  Neurological:     Mental Status: She is alert and oriented to person, place, and time.  Psychiatric:        Mood and Affect: Mood normal.        Behavior: Behavior normal.     Health Maintenance Due  Topic Date Due   DTaP/Tdap/Td (1 - Tdap) Never done   Zoster Vaccines- Shingrix (1 of 2) Never done   OPHTHALMOLOGY EXAM  04/25/2021   COVID-19 Vaccine (7 - 2024-25 season) 09/22/2022   Medicare Annual Wellness (AWV)  07/30/2023    There are no preventive care reminders to display for this patient.   Lab Results  Component Value Date   TSH 1.090 03/06/2023   Lab Results  Component Value Date   WBC 8.6 06/09/2023   HGB 13.6 06/09/2023   HCT 43.7 06/09/2023    MCV 93 06/09/2023   PLT 381 06/09/2023   Lab Results  Component Value Date   NA 140 06/09/2023   K 5.3 (H) 06/09/2023   CO2 19 (L) 06/09/2023   GLUCOSE 99 06/09/2023   BUN 13 06/09/2023   CREATININE 0.60 06/09/2023   BILITOT 0.4 06/09/2023   ALKPHOS 98 06/09/2023   AST 17 06/09/2023   ALT 13 06/09/2023   PROT 7.0 06/09/2023   ALBUMIN  4.2 06/09/2023   CALCIUM  9.5 06/09/2023   ANIONGAP 10 12/27/2018   EGFR 95 06/09/2023   GFR 59.89 (L) 10/02/2018   Lab Results  Component Value Date   CHOL 135 06/09/2023   Lab Results  Component Value Date   HDL 64 06/09/2023   Lab Results  Component Value Date   LDLCALC 48 06/09/2023   Lab Results  Component Value Date   TRIG 135 06/09/2023   Lab Results  Component Value Date   CHOLHDL 2.1 06/09/2023   Lab Results  Component Value Date   HGBA1C 6.4 (H) 06/09/2023       Assessment & Plan:  Chronic cough Assessment & Plan: Improved with change from lisinopril  to losartan .    Hypertension associated with diabetes [facility]) Assessment & Plan: The current medical regimen is effective;  continue present plan and medications.  Losartan  50 mg daily.  Orders: -     metFORMIN  HCl; TAKE 1 TABLET BY MOUTH ONCE DAILY AFTER  THE  BIGGEST  MEAL  Dispense: 90 tablet; Refill: 1  Diabetic glomerulopathy (HCC) Assessment & Plan: Control:  good Recommend check sugars fasting daily. Recommend check feet daily. Recommend annual eye exams. Continue Rybelsus  14 mg daily, and metformin  500 mg daily Continue to work on eating a healthy diet and exercise.    Orders: -     Lancet Device; Use as directed to check sugar daily.  Dispense: 1 each; Refill: 0  Lumbar back pain Assessment & Plan: Recommend go to physical therapy. Contact  information given.  Recommend tylenol  2 oral twice daily.       Meds ordered this encounter  Medications   metFORMIN  (GLUCOPHAGE ) 500 MG tablet    Sig: TAKE 1 TABLET BY MOUTH ONCE DAILY AFTER  THE   BIGGEST  MEAL    Dispense:  90 tablet    Refill:  1   Lancet Device MISC    Sig: Use as directed to check sugar daily.    Dispense:  1 each    Refill:  0    No orders of the defined types were placed in this encounter.    Follow-up: Return if symptoms worsen or fail to improve.  An After Visit Summary was printed and given to the patient.   Gladys Lamp I Leal-Borjas,acting as a scribe for Mercy Stall, MD.,have documented all relevant documentation on the behalf of Mercy Stall, MD,as directed by  Mercy Stall, MD while in the presence of Mercy Stall, MD.   I attest that I have reviewed this visit and agree with the plan scribed by my staff.   Mercy Stall, MD [facility] [phone]

## 2023-06-29 ENCOUNTER — Encounter: Payer: Self-pay | Admitting: Family Medicine

## 2023-06-29 DIAGNOSIS — E1159 Type 2 diabetes mellitus with other circulatory complications: Secondary | ICD-10-CM | POA: Insufficient documentation

## 2023-06-29 DIAGNOSIS — I152 Hypertension secondary to endocrine disorders: Secondary | ICD-10-CM | POA: Insufficient documentation

## 2023-06-29 NOTE — Assessment & Plan Note (Signed)
 Improved with change from lisinopril  to losartan .

## 2023-06-29 NOTE — Assessment & Plan Note (Addendum)
 The current medical regimen is effective;  continue present plan and medications.  Losartan  50 mg daily.

## 2023-06-29 NOTE — Assessment & Plan Note (Signed)
 Control:  good Recommend check sugars fasting daily. Recommend check feet daily. Recommend annual eye exams. Continue Rybelsus  14 mg daily, and metformin  500 mg daily Continue to work on eating a healthy diet and exercise.

## 2023-06-29 NOTE — Assessment & Plan Note (Signed)
 Recommend go to physical therapy. Contact information given.  Recommend tylenol  2 oral twice daily.

## 2023-07-02 ENCOUNTER — Other Ambulatory Visit: Payer: Self-pay

## 2023-07-02 DIAGNOSIS — R053 Chronic cough: Secondary | ICD-10-CM

## 2023-07-02 MED ORDER — ALBUTEROL SULFATE HFA 108 (90 BASE) MCG/ACT IN AERS: 2.0000 | INHALATION_SPRAY | Freq: Four times a day (QID) | RESPIRATORY_TRACT | 3 refills | Status: DC | PRN

## 2023-07-16 DIAGNOSIS — M545 Low back pain, unspecified: Secondary | ICD-10-CM | POA: Diagnosis not present

## 2023-07-26 ENCOUNTER — Other Ambulatory Visit: Payer: Self-pay | Admitting: Family Medicine

## 2023-08-05 ENCOUNTER — Ambulatory Visit: Admitting: Pulmonary Disease

## 2023-08-05 ENCOUNTER — Encounter

## 2023-08-11 ENCOUNTER — Other Ambulatory Visit: Payer: Self-pay | Admitting: Family Medicine

## 2023-08-11 DIAGNOSIS — E782 Mixed hyperlipidemia: Secondary | ICD-10-CM

## 2023-08-12 ENCOUNTER — Other Ambulatory Visit: Payer: Self-pay | Admitting: Family Medicine

## 2023-09-16 ENCOUNTER — Ambulatory Visit: Admitting: Family Medicine

## 2023-09-16 ENCOUNTER — Encounter: Payer: Self-pay | Admitting: Family Medicine

## 2023-09-16 VITALS — BP 120/78 | HR 67 | Temp 98.0°F | Ht <= 58 in | Wt 182.0 lb

## 2023-09-16 DIAGNOSIS — J301 Allergic rhinitis due to pollen: Secondary | ICD-10-CM | POA: Diagnosis not present

## 2023-09-16 DIAGNOSIS — E1121 Type 2 diabetes mellitus with diabetic nephropathy: Secondary | ICD-10-CM | POA: Diagnosis not present

## 2023-09-16 DIAGNOSIS — E1159 Type 2 diabetes mellitus with other circulatory complications: Secondary | ICD-10-CM | POA: Diagnosis not present

## 2023-09-16 DIAGNOSIS — I10 Essential (primary) hypertension: Secondary | ICD-10-CM | POA: Diagnosis not present

## 2023-09-16 DIAGNOSIS — E782 Mixed hyperlipidemia: Secondary | ICD-10-CM

## 2023-09-16 DIAGNOSIS — I152 Hypertension secondary to endocrine disorders: Secondary | ICD-10-CM | POA: Diagnosis not present

## 2023-09-16 LAB — CBC WITH DIFFERENTIAL/PLATELET
Basophils Absolute: 0.1 x10E3/uL (ref 0.0–0.2)
Basos: 1 %
EOS (ABSOLUTE): 0.4 x10E3/uL (ref 0.0–0.4)
Eos: 5 %
Hematocrit: 44.8 % (ref 34.0–46.6)
Hemoglobin: 14 g/dL (ref 11.1–15.9)
Immature Grans (Abs): 0 x10E3/uL (ref 0.0–0.1)
Immature Granulocytes: 0 %
Lymphocytes Absolute: 2.4 x10E3/uL (ref 0.7–3.1)
Lymphs: 32 %
MCH: 28.5 pg (ref 26.6–33.0)
MCHC: 31.3 g/dL — ABNORMAL LOW (ref 31.5–35.7)
MCV: 91 fL (ref 79–97)
Monocytes Absolute: 0.5 x10E3/uL (ref 0.1–0.9)
Monocytes: 6 %
Neutrophils Absolute: 4.1 x10E3/uL (ref 1.4–7.0)
Neutrophils: 56 %
Platelets: 373 x10E3/uL (ref 150–450)
RBC: 4.92 x10E6/uL (ref 3.77–5.28)
RDW: 13.1 % (ref 11.7–15.4)
WBC: 7.4 x10E3/uL (ref 3.4–10.8)

## 2023-09-16 LAB — LIPID PANEL
Chol/HDL Ratio: 2.4 ratio (ref 0.0–4.4)
Cholesterol, Total: 124 mg/dL (ref 100–199)
HDL: 51 mg/dL (ref >39)
LDL Chol Calc (NIH): 50 mg/dL (ref 0–99)
Triglycerides: 133 mg/dL (ref 0–149)
VLDL Cholesterol Cal: 23 mg/dL (ref 5–40)

## 2023-09-16 LAB — COMPREHENSIVE METABOLIC PANEL WITH GFR
ALT: 13 IU/L (ref 0–32)
AST: 14 IU/L (ref 0–40)
Albumin: 4.2 g/dL (ref 3.8–4.8)
Alkaline Phosphatase: 118 IU/L (ref 44–121)
BUN/Creatinine Ratio: 22 (ref 12–28)
BUN: 14 mg/dL (ref 8–27)
Bilirubin Total: 0.4 mg/dL (ref 0.0–1.2)
CO2: 19 mmol/L — ABNORMAL LOW (ref 20–29)
Calcium: 9.7 mg/dL (ref 8.7–10.3)
Chloride: 105 mmol/L (ref 96–106)
Creatinine, Ser: 0.64 mg/dL (ref 0.57–1.00)
Globulin, Total: 2.5 g/dL (ref 1.5–4.5)
Glucose: 82 mg/dL (ref 70–99)
Potassium: 4.7 mmol/L (ref 3.5–5.2)
Sodium: 140 mmol/L (ref 134–144)
Total Protein: 6.7 g/dL (ref 6.0–8.5)
eGFR: 93 mL/min/1.73 (ref >59)

## 2023-09-16 LAB — HEMOGLOBIN A1C
Est. average glucose Bld gHb Est-mCnc: 137 mg/dL
Hgb A1c MFr Bld: 6.4 % — ABNORMAL HIGH (ref 4.8–5.6)

## 2023-09-16 NOTE — Progress Notes (Signed)
 Subjective:  Patient ID: Julia Johns, female    DOB: 03/01/1950  Age: 73 y.o. MRN: [id number]  Chief Complaint  Patient presents with   Medical Management of Chronic Issues    HPI: Discussed the use of AI scribe software for clinical note transcription with the patient, who gave verbal consent to proceed.  History of Present Illness   Julia Johns is a 73 year old female with diabetes and high cholesterol who presents with left ear pain and cold symptoms.  Left otalgia and upper respiratory symptoms - Left ear pain with throbbing sensation and fullness on the left side - Significant mucus production - No fever - No recent COVID-19 exposure - Has not eaten this morning - Using cough drops for symptom relief  Diabetes mellitus - Managed with Rybelsus , metformin , and Jardiance  - Blood sugar levels stable 100-125 - Not changed diet. Exercising more.  - needs eye exam. - [place] traveled [place] without her glucose meter  Allergic rhinitis and asthma - Uses cetirizine  and Flonase  for allergies - Recently resumed Wixela inhaler due to recurrence of symptoms  Hyperlipidemia - History of high cholesterol     Hypertension: well controlled with losartan  50 mg daily. Cough resolved with change from lisinopril  to losartan .      11/18/2022    7:58 AM 07/30/2022    7:50 AM 04/09/2022    8:09 AM 12/04/2021    7:46 AM 01/23/2021   10:13 AM  Depression screen PHQ 2/9  Decreased Interest 0 0 0 0 0  Down, Depressed, Hopeless 0 0 0 0 0  PHQ - 2 Score 0 0 0 0 0  Altered sleeping    0   Tired, decreased energy    0   Change in appetite    0   Feeling bad or failure about yourself     0   Trouble concentrating    0   Moving slowly or fidgety/restless    0   Suicidal thoughts    0   PHQ-9 Score    0   Difficult doing work/chores    Not difficult at all         03/06/2023    8:07 AM  Fall Risk   Falls in the past year? 0  Number falls in past yr: 0   Injury with Fall? 0  Risk for fall due to : No Fall Risks  Follow up Falls evaluation completed    Patient Care Team: Sherre Clapper, MD as PCP - General (Family Medicine) Mercie Landmark, MD as Referring Physician (Internal Medicine)   Review of Systems  Constitutional:  Negative for chills, fatigue and fever.  HENT:  Positive for congestion, ear pain and rhinorrhea. Negative for sore throat.   Respiratory:  Negative for cough and shortness of breath.   Cardiovascular:  Negative for chest pain.  Gastrointestinal:  Negative for abdominal pain, constipation, diarrhea, nausea and vomiting.  Genitourinary:  Negative for dysuria and urgency.  Musculoskeletal:  Positive for arthralgias and back pain. Negative for myalgias.  Neurological:  Negative for dizziness and headaches.  Psychiatric/Behavioral:  Negative for dysphoric mood. The patient is not nervous/anxious.     Current Outpatient Medications on File Prior to Visit  Medication Sig Dispense Refill   albuterol  (VENTOLIN  HFA) 108 (90 Base) MCG/ACT inhaler Inhale 2 puffs into the lungs every 6 (six) hours as needed for wheezing or shortness of breath. 18 g 3   cetirizine  (ZYRTEC ) 10 MG tablet  Take 1 tablet by mouth once daily 90 tablet 0   empagliflozin  (JARDIANCE ) 10 MG TABS tablet Take 1 tablet (10 mg total) by mouth daily before breakfast. 90 tablet 1   fluticasone  (FLONASE ) 50 MCG/ACT nasal spray Place 1 spray into both nostrils daily. 16 mL 2   fluticasone -salmeterol (WIXELA INHUB) 250-50 MCG/ACT AEPB Inhale 1 puff into the lungs in the morning and at bedtime. 60 each 3   glucose blood (ACCU-CHEK AVIVA PLUS) test strip Use as instructed (Patient not taking: Reported on 06/09/2023) 100 each 12   Lancet Device MISC Use as directed to check sugar daily. 1 each 0   losartan  (COZAAR ) 50 MG tablet Take 1 tablet (50 mg total) by mouth daily. 90 tablet 1   metFORMIN  (GLUCOPHAGE ) 500 MG tablet TAKE 1 TABLET BY MOUTH ONCE DAILY AFTER  THE   BIGGEST  MEAL 90 tablet 1   rosuvastatin  (CRESTOR ) 10 MG tablet Take 1 tablet by mouth once daily 90 tablet 0   Semaglutide  (RYBELSUS ) 14 MG TABS Take 1 tablet (14 mg total) by mouth daily. 90 tablet 1   No current facility-administered medications on file prior to visit.   Past Medical History:  Diagnosis Date   Chronic kidney disease    Chronic respiratory failure with hypoxia (HCC)    10/09/2018-overnight oximetry- duration of sleep 7 hours and 34 minutes, on room air, SPO2 less than 88% 1 hour and 34 minutes and 28 seconds.    Diabetes mellitus without complication (HCC)    Grade II hemorrhoids 07/03/2017   Last Assessment & Plan:  Formatting of this note might be different from the original. Fiber supplement stool softener adequate hydration and local hemorrhoid management is advisable.   Vasculitis (HCC)    Vasculitis, ANCA positive (HCC)    Past Surgical History:  Procedure Laterality Date   VIDEO BRONCHOSCOPY Bilateral 06/18/2018   Procedure: VIDEO BRONCHOSCOPY WITH FLUORO;  Surgeon: Jude Harden GAILS, MD;  Location: [facility] ENDOSCOPY;  Service: Cardiopulmonary;  Laterality: Bilateral;   VIDEO BRONCHOSCOPY WITH ENDOBRONCHIAL ULTRASOUND N/A 06/25/2018   Procedure: VIDEO BRONCHOSCOPY WITH ENDOBRONCHIAL ULTRASOUND;  Surgeon: Jude Harden GAILS, MD;  Location: [facility] OR;  Service: Thoracic;  Laterality: N/A;    Family History  Problem Relation Age of Onset   Uterine cancer Mother    Breast cancer Neg Hx    Social History   Socioeconomic History   Marital status: Single    Spouse name: Not on file   Number of children: Not on file   Years of education: Not on file   Highest education level: Not on file  Occupational History   Not on file  Tobacco Use   Smoking status: Never   Smokeless tobacco: Never  Vaping Use   Vaping status: Never Used  Substance and Sexual Activity   Alcohol use: Never   Drug use: Never   Sexual activity: Not Currently  Other Topics Concern   Not on file  Social  History Narrative   Patient primary language is spanish, she lives at home with her son Eric Dolores and daughter in law [name]    Social Drivers of Health   Financial Resource Strain: Low Risk  (07/30/2022)   Overall Financial Resource Strain (CARDIA)    Difficulty of Paying Living Expenses: Not hard at all  Food Insecurity: No Food Insecurity (07/30/2022)   Hunger Vital Sign    Worried About Running Out of Food in the Last Year: Never true    Ran Out  of Food in the Last Year: Never true  Transportation Needs: No Transportation Needs (07/30/2022)   PRAPARE - Administrator, Civil Service (Medical): No    Lack of Transportation (Non-Medical): No  Physical Activity: Inactive (07/30/2022)   Exercise Vital Sign    Days of Exercise per Week: 0 days    Minutes of Exercise per Session: 0 min  Stress: No Stress Concern Present (07/30/2022)   [employer] of Occupational Health - Occupational Stress Questionnaire    Feeling of Stress : Not at all  Social Connections: Moderately Isolated (07/30/2022)   Social Connection and Isolation Panel    Frequency of Communication with Friends and Family: Never    Frequency of Social Gatherings with Friends and Family: More than three times a week    Attends Religious Services: Never    Database administrator or Organizations: Yes    Attends Banker Meetings: Never    Marital Status: Widowed    Objective:  BP 120/78   Pulse 67   Temp 98 F (36.7 C)   Ht 4' 9 (1.448 m)   Wt 182 lb (82.6 kg)   SpO2 93%   BMI 39.38 kg/m      09/16/2023    8:45 AM 06/24/2023   10:00 AM 06/09/2023    8:44 AM  BP/Weight  Systolic BP 120 124 120  Diastolic BP 78 74 60  Wt. (Lbs) 182 184 187  BMI 39.38 kg/m2 39.82 kg/m2 40.47 kg/m2    Physical Exam Vitals reviewed.  Constitutional:      Appearance: Normal appearance. She is normal weight.  HENT:     Right Ear: Tympanic membrane, ear canal and external ear normal.     Left Ear:  Tympanic membrane, ear canal and external ear normal.     Nose: Nose normal.     Right Turbinates: Enlarged, swollen and pale.     Left Turbinates: Enlarged, swollen and pale.     Mouth/Throat:     Pharynx: Oropharynx is clear.  Neck:     Vascular: No carotid bruit.  Cardiovascular:     Rate and Rhythm: Normal rate and regular rhythm.     Heart sounds: Normal heart sounds. No murmur heard. Pulmonary:     Effort: Pulmonary effort is normal. No respiratory distress.     Breath sounds: Normal breath sounds.  Abdominal:     General: Abdomen is flat. Bowel sounds are normal.     Palpations: Abdomen is soft.     Tenderness: There is no abdominal tenderness.  Lymphadenopathy:     Cervical: No cervical adenopathy.  Neurological:     Mental Status: She is alert and oriented to person, place, and time.  Psychiatric:        Mood and Affect: Mood normal.        Behavior: Behavior normal.      Diabetic foot exam was performed with the following findings:   No deformities, ulcerations, or other skin breakdown Normal sensation of 10g monofilament Intact posterior tibialis and dorsalis pedis pulses      Lab Results  Component Value Date   WBC 8.6 06/09/2023   HGB 13.6 06/09/2023   HCT 43.7 06/09/2023   PLT 381 06/09/2023   GLUCOSE 99 06/09/2023   CHOL 135 06/09/2023   TRIG 135 06/09/2023   HDL 64 06/09/2023   LDLCALC 48 06/09/2023   ALT 13 06/09/2023   AST 17 06/09/2023   NA 140 06/09/2023  K 5.3 (H) 06/09/2023   CL 103 06/09/2023   CREATININE 0.60 06/09/2023   BUN 13 06/09/2023   CO2 19 (L) 06/09/2023   TSH 1.090 03/06/2023   INR 1.2 07/05/2018   HGBA1C 6.4 (H) 06/09/2023      Assessment & Plan:  Mixed hyperlipidemia Assessment & Plan: Well controlled.  No changes to medicines. Rosuvastatin  10 mg daily. Continue to work on eating a healthy diet and exercise.  Labs drawn today.    Orders: -     Lipid panel  Benign hypertension Assessment & Plan: The current  medical regimen is effective;  continue present plan and medications.  Losartan  50 mg daily.  Orders: -     CBC with Differential/Platelet -     Comprehensive metabolic panel with GFR  Diabetic glomerulopathy (HCC) Assessment & Plan: Blood sugars well-controlled with current medication regimen. - Continue Rybelsus  14 mg daily, metformin  500 mg daily, and Jardiance  10 mg daily. - Monitor blood sugar levels regularly. - Send to [facility] for blood work.  Orders: -     Hemoglobin A1c -     Microalbumin / creatinine urine ratio  Seasonal allergic rhinitis due to pollen Assessment & Plan: Cough and symptoms consistent with allergic rhinitis, likely due to seasonal allergies. Steroid injection declined due to potential for elevated blood sugars. - Continue cetirizine  and Flonase  nasal spray. - Consider antibiotics in 2-3 days if symptoms do not improve or worsen.    No orders of the defined types were placed in this encounter.   Orders Placed This Encounter  Procedures   CBC with Differential/Platelet   Comprehensive metabolic panel with GFR   Hemoglobin A1c   Lipid panel   Microalbumin / creatinine urine ratio     Follow-up: Return in about 4 weeks (around 10/14/2023) for awv, Dina, brady.    An After Visit Summary was printed and given to the patient.  Abigail Free, MD [facility] [phone]

## 2023-09-16 NOTE — Assessment & Plan Note (Signed)
 Blood sugars well-controlled with current medication regimen. - Continue Rybelsus  14 mg daily, metformin  500 mg daily, and Jardiance  10 mg daily. - Monitor blood sugar levels regularly. - Send to [facility] for blood work.

## 2023-09-16 NOTE — Assessment & Plan Note (Signed)
Well controlled.  No changes to medicines. Rosuvastatin 10 mg daily. Continue to work on eating a healthy diet and exercise.  Labs drawn today.   

## 2023-09-16 NOTE — Assessment & Plan Note (Signed)
 Cough and symptoms consistent with allergic rhinitis, likely due to seasonal allergies. Steroid injection declined due to potential for elevated blood sugars. - Continue cetirizine  and Flonase  nasal spray. - Consider antibiotics in 2-3 days if symptoms do not improve or worsen.

## 2023-09-16 NOTE — Assessment & Plan Note (Signed)
 The current medical regimen is effective;  continue present plan and medications.  Losartan  50 mg daily.

## 2023-09-16 NOTE — Patient Instructions (Signed)
  VISIT SUMMARY: Today, you were seen for left ear pain and cold symptoms. We also reviewed your diabetes, high cholesterol, and other ongoing health conditions.  YOUR PLAN: LEFT EAR PAIN AND COLD SYMPTOMS: You have a throbbing sensation and fullness in your left ear along with significant mucus production, likely due to seasonal allergies. -Continue taking cetirizine  and using Flonase  nasal spray. -Consider antibiotics in 2-3 days if symptoms do not improve or worsen.  TYPE 2 DIABETES MELLITUS: Your blood sugar levels are well-controlled with your current medications. -Continue taking Rybelsus  14 mg daily, metformin  500 mg daily, and Jardiance  10 mg daily. -Monitor your blood sugar levels regularly. -Go to [facility] for blood work.  HYPERTENSION: Your high blood pressure is being managed with medication. -Continue taking losartan  as prescribed.  HYPERLIPIDEMIA: Your high cholesterol is being managed with medication. -Continue taking rosuvastatin  10 mg daily.  OSTEOARTHRITIS OF KNEES: You have knee pain likely due to osteoarthritis. -Continue your home exercise program as instructed by physical therapy.                      Contains text generated by [employer]bridge.                                 Contains text generated by [employer]bridge.

## 2023-09-18 ENCOUNTER — Ambulatory Visit: Payer: Self-pay | Admitting: Family Medicine

## 2023-10-15 ENCOUNTER — Ambulatory Visit: Admitting: Family Medicine

## 2023-10-17 ENCOUNTER — Ambulatory Visit (INDEPENDENT_AMBULATORY_CARE_PROVIDER_SITE_OTHER): Admitting: Family Medicine

## 2023-10-17 ENCOUNTER — Encounter: Payer: Self-pay | Admitting: Family Medicine

## 2023-10-17 VITALS — BP 130/78 | HR 68 | Temp 98.1°F | Ht <= 58 in | Wt 180.0 lb

## 2023-10-17 DIAGNOSIS — Z0000 Encounter for general adult medical examination without abnormal findings: Secondary | ICD-10-CM

## 2023-10-17 DIAGNOSIS — Z23 Encounter for immunization: Secondary | ICD-10-CM

## 2023-10-17 NOTE — Assessment & Plan Note (Signed)
 Up to date on screenings Needs TDap and shingles - advised to go to the pharmacy Needs Eye exam - will reschedule  Things to do to keep yourself healthy  - Exercise at least 30-45 minutes a day, 3-4 days a week.  - Eat a low-fat diet with lots of fruits and vegetables, up to 7-9 servings per day.  - Seatbelts can save your life. Wear them always.  - Smoke detectors on every level of your home, check batteries every year.  - Eye Doctor - have an eye exam every 1-2 years  - Safe sex - if you may be exposed to STDs, use a condom.  - Alcohol -  If you drink, do it moderately, less than 2 drinks per day.  - Health Care Power of Attorney. Choose someone to speak for you if you are not able.  - Depression is common in our stressful world.If you're feeling down or losing interest in things you normally enjoy, please come in for a visit.  - Violence - If anyone is threatening or hurting you, please call immediately.

## 2023-10-17 NOTE — Progress Notes (Signed)
 Subjective:   Julia Johns is a 73 y.o. female who presents for Medicare Annual (Subsequent) preventive examination.  Visit Complete: In person  Patient Medicare AWV questionnaire was completed by the patient; I have confirmed that all information answered by patient is correct and no changes since this date.  Cardiac Risk Factors include: advanced age (>29men, >49 women);diabetes mellitus;dyslipidemia;obesity (BMI >30kg/m2)     Objective:    Today's Vitals   10/17/23 0751 10/17/23 0757  BP: 130/78   Pulse: 68   Temp: 98.1 F (36.7 C)   SpO2: 97%   Weight: 180 lb (81.6 kg)   Height: 4' 9 (1.448 m)   PainSc: 0-No pain 0-No pain   Body mass index is 38.95 kg/m.     10/17/2023    7:55 AM 07/30/2022    8:22 AM 01/23/2021   10:12 AM 01/19/2020   10:59 AM 12/27/2018    4:50 AM 08/05/2018    8:41 AM 07/21/2018    4:30 AM  Advanced Directives  Does Patient Have a Medical Advance Directive? No No No No No No No  Would patient like information on creating a medical advance directive? No - Patient declined Yes (ED - Information included in AVS) Yes (MAU/Ambulatory/Procedural Areas - Information given)  No - Patient declined  No - Patient declined      Data saved with a previous flowsheet row definition    Current Medications (verified) Outpatient Encounter Medications as of 10/17/2023  Medication Sig   albuterol  (VENTOLIN  HFA) 108 (90 Base) MCG/ACT inhaler Inhale 2 puffs into the lungs every 6 (six) hours as needed for wheezing or shortness of breath.   cetirizine  (ZYRTEC ) 10 MG tablet Take 1 tablet by mouth once daily   empagliflozin  (JARDIANCE ) 10 MG TABS tablet Take 1 tablet (10 mg total) by mouth daily before breakfast.   fluticasone  (FLONASE ) 50 MCG/ACT nasal spray Place 1 spray into both nostrils daily.   fluticasone -salmeterol (WIXELA INHUB) 250-50 MCG/ACT AEPB Inhale 1 puff into the lungs in the morning and at bedtime.   glucose blood (ACCU-CHEK AVIVA PLUS) test  strip Use as instructed   Lancet Device MISC Use as directed to check sugar daily.   losartan  (COZAAR ) 50 MG tablet Take 1 tablet (50 mg total) by mouth daily.   metFORMIN  (GLUCOPHAGE ) 500 MG tablet TAKE 1 TABLET BY MOUTH ONCE DAILY AFTER  THE  BIGGEST  MEAL   rosuvastatin  (CRESTOR ) 10 MG tablet Take 1 tablet by mouth once daily   Semaglutide  (RYBELSUS ) 14 MG TABS Take 1 tablet (14 mg total) by mouth daily.   No facility-administered encounter medications on file as of 10/17/2023.    Allergies (verified) Penicillins and Tetracycline   History: Past Medical History:  Diagnosis Date   Chronic kidney disease    Chronic respiratory failure with hypoxia (HCC)    10/09/2018-overnight oximetry- duration of sleep 7 hours and 34 minutes, on room air, SPO2 less than 88% 1 hour and 34 minutes and 28 seconds.    Diabetes mellitus without complication (HCC)    Grade II hemorrhoids 07/03/2017   Last Assessment & Plan:  Formatting of this note might be different from the original. Fiber supplement stool softener adequate hydration and local hemorrhoid management is advisable.   Vasculitis    Vasculitis, ANCA positive (HCC)    Past Surgical History:  Procedure Laterality Date   VIDEO BRONCHOSCOPY Bilateral 06/18/2018   Procedure: VIDEO BRONCHOSCOPY WITH FLUORO;  Surgeon: Jude Harden GAILS, MD;  Location: [facility]  ENDOSCOPY;  Service: Cardiopulmonary;  Laterality: Bilateral;   VIDEO BRONCHOSCOPY WITH ENDOBRONCHIAL ULTRASOUND N/A 06/25/2018   Procedure: VIDEO BRONCHOSCOPY WITH ENDOBRONCHIAL ULTRASOUND;  Surgeon: Jude Harden GAILS, MD;  Location: [facility] OR;  Service: Thoracic;  Laterality: N/A;   Family History  Problem Relation Age of Onset   Uterine cancer Mother    Breast cancer Neg Hx    Social History   Socioeconomic History   Marital status: Single    Spouse name: Not on file   Number of children: Not on file   Years of education: Not on file   Highest education level: Not on file  Occupational History    Not on file  Tobacco Use   Smoking status: Never   Smokeless tobacco: Never  Vaping Use   Vaping status: Never Used  Substance and Sexual Activity   Alcohol use: Never   Drug use: Never   Sexual activity: Not Currently  Other Topics Concern   Not on file  Social History Narrative   Patient primary language is spanish, she lives at home with her son Eric Dolores and daughter in law [name]    Social Drivers of Health   Financial Resource Strain: Low Risk  (10/17/2023)   Overall Financial Resource Strain (CARDIA)    Difficulty of Paying Living Expenses: Not hard at all  Food Insecurity: No Food Insecurity (10/17/2023)   Hunger Vital Sign    Worried About Running Out of Food in the Last Year: Never true    Ran Out of Food in the Last Year: Never true  Transportation Needs: No Transportation Needs (10/17/2023)   PRAPARE - Administrator, Civil Service (Medical): No    Lack of Transportation (Non-Medical): No  Physical Activity: Sufficiently Active (10/17/2023)   Exercise Vital Sign    Days of Exercise per Week: 5 days    Minutes of Exercise per Session: 40 min  Stress: No Stress Concern Present (10/17/2023)   [employer] of Occupational Health - Occupational Stress Questionnaire    Feeling of Stress: Not at all  Social Connections: Socially Isolated (10/17/2023)   Social Connection and Isolation Panel    Frequency of Communication with Friends and Family: More than three times a week    Frequency of Social Gatherings with Friends and Family: More than three times a week    Attends Religious Services: Never    Database administrator or Organizations: No    Attends Banker Meetings: Never    Marital Status: Widowed    Tobacco Counseling Counseling given: Not Answered   Clinical Intake:  Pre-visit preparation completed: No  Pain : No/denies pain Pain Score: 0-No pain     Nutritional Status: BMI > 30  Obese Nutritional Risks:  None Diabetes: Yes CBG done?: No Did pt. bring in CBG monitor from home?: No  How often do you need to have someone help you when you read instructions, pamphlets, or other written materials from your doctor or pharmacy?: 1 - Never  Interpreter Needed?: No      Activities of Daily Living    10/17/2023    7:56 AM  In your present state of health, do you have any difficulty performing the following activities:  Hearing? 0  Vision? 0  Difficulty concentrating or making decisions? 0  Walking or climbing stairs? 0  Dressing or bathing? 0  Doing errands, shopping? 0  Preparing Food and eating ? N  Using the Toilet? N  In the  past six months, have you accidently leaked urine? N  Do you have problems with loss of bowel control? N  Managing your Medications? N  Managing your Finances? N  Housekeeping or managing your Housekeeping? N    Patient Care Team: Sherre Clapper, MD as PCP - General (Family Medicine) Mercie Landmark, MD as Referring Physician (Internal Medicine)  Indicate any recent Medical Services you may have received from other than [facility] providers in the past year (date may be approximate).     Assessment:   This is a routine wellness examination for Ryn.  Hearing/Vision screen No results found.   Goals Addressed             This Visit's Progress    DIET - REDUCE SUGAR INTAKE   On track    Reduce unhealthy snacking - pay attention to sugar and carbs in the foods you are eating     Exercise 3x per week (30 min per time)   On track      Depression Screen    10/17/2023    7:56 AM 11/18/2022    7:58 AM 07/30/2022    7:50 AM 04/09/2022    8:09 AM 12/04/2021    7:46 AM 01/23/2021   10:13 AM 04/18/2020    8:02 AM  PHQ 2/9 Scores  PHQ - 2 Score 0 0 0 0 0 0 0  PHQ- 9 Score     0      Fall Risk    10/17/2023    7:56 AM 03/06/2023    8:07 AM 11/18/2022    7:57 AM 07/30/2022    8:27 AM 07/30/2022    7:50 AM  Fall Risk   Falls in the past year? 0 0 0 0 0   Number falls in past yr: 0 0 0 0 0  Injury with Fall? 0 0 0 0 0  Risk for fall due to : No Fall Risks No Fall Risks No Fall Risks No Fall Risks No Fall Risks  Follow up Falls evaluation completed Falls evaluation completed Falls evaluation completed Falls prevention discussed Falls evaluation completed    MEDICARE RISK AT HOME: Medicare Risk at Home Any stairs in or around the home?: Yes If so, are there any without handrails?: No Home free of loose throw rugs in walkways, pet beds, electrical cords, etc?: Yes Adequate lighting in your home to reduce risk of falls?: Yes Life alert?: No Use of a cane, walker or w/c?: No Grab bars in the bathroom?: Yes Shower chair or bench in shower?: Yes Elevated toilet seat or a handicapped toilet?: No  TIMED UP AND GO:  Was the test performed?  Yes  Length of time to ambulate 10 feet: 2 sec Gait steady and fast without use of assistive device    Cognitive Function:    07/30/2022    8:34 AM 01/19/2020   11:20 AM  MMSE - Mini Mental State Exam  Orientation to time 5 4  Orientation to Place 5 5  Registration 3 3  Attention/ Calculation 5 4  Recall 1 3  Language- name 2 objects 2 2  Language- repeat 1 0  Language- follow 3 step command 3 2  Language- read & follow direction 1 1  Write a sentence  1  Copy design  1  Total score  26        10/17/2023    8:02 AM 07/30/2022    8:31 AM 01/23/2021   10:41 AM 01/19/2020   11:09  AM  6CIT Screen  What Year? 0 points 0 points 0 points 0 points  What month? 0 points 0 points 0 points 0 points  What time? 0 points 3 points 0 points 0 points  Count back from 20 0 points 0 points 2 points 4 points  Months in reverse 0 points 2 points 4 points 4 points  Repeat phrase 0 points 0 points 0 points 0 points  Total Score 0 points 5 points 6 points 8 points    Immunizations Immunization History  Administered Date(s) Administered   Fluad Quad(high Dose 65+) 10/22/2019, 11/24/2020, 12/04/2021   Fluad  Trivalent(High Dose 65+) 11/18/2022   Influenza-Unspecified 10/23/2017   PFIZER Comirnaty(Gray Top)Covid-19 Tri-Sucrose Vaccine 04/18/2020   PFIZER(Purple Top)SARS-COV-2 Vaccination 04/15/2019, 05/10/2019, 11/18/2019   PNEUMOCOCCAL CONJUGATE-20 04/09/2022   Pfizer Covid-19 Vaccine Bivalent Booster 93yrs & up 11/24/2020   Pfizer(Comirnaty)Fall Seasonal Vaccine 12 years and older 12/04/2021   Pneumococcal Conjugate-13 04/18/2020    Screening Tests Health Maintenance  Topic Date Due   DTaP/Tdap/Td (1 - Tdap) Never done   Zoster Vaccines- Shingrix (1 of 2) Never done   OPHTHALMOLOGY EXAM  04/25/2021   COVID-19 Vaccine (7 - 2025-26 season) 09/22/2023   Influenza Vaccine  11/20/2023 (Originally 08/22/2023)   Mammogram  09/15/2024 (Originally 02/21/2022)   FOOT EXAM  11/18/2023   HEMOGLOBIN A1C  03/18/2024   Diabetic kidney evaluation - Urine ACR  06/08/2024   Diabetic kidney evaluation - eGFR measurement  09/15/2024   Medicare Annual Wellness (AWV)  10/16/2024   Colonoscopy  07/05/2027   Pneumococcal Vaccine: 50+ Years  Completed   DEXA SCAN  Completed   Hepatitis C Screening  Completed   HPV VACCINES  Aged Out   Meningococcal B Vaccine  Aged Out    Health Maintenance  Health Maintenance Due  Topic Date Due   DTaP/Tdap/Td (1 - Tdap) Never done   Zoster Vaccines- Shingrix (1 of 2) Never done   OPHTHALMOLOGY EXAM  04/25/2021   COVID-19 Vaccine (7 - 2025-26 season) 09/22/2023    Additional Screening:  Vision Screening: Recommended annual ophthalmology exams for early detection of glaucoma and other disorders of the eye. Is the patient up to date with their annual eye exam?  No  Who is the provider or what is the name of the office in which the patient attends annual eye exams?    Dental Screening: Recommended annual dental exams for proper oral hygiene  Diabetic Foot Exam: Diabetic Foot Exam: Completed 09/16/23  Community Resource Referral / Chronic Care Management: CRR  required this visit?  No   CCM required this visit?  No     Plan:    Encounter for Medicare annual wellness exam Assessment & Plan: Up to date on screenings Needs TDap and shingles - advised to go to the pharmacy Needs Eye exam - will reschedule  Things to do to keep yourself healthy  - Exercise at least 30-45 minutes a day, 3-4 days a week.  - Eat a low-fat diet with lots of fruits and vegetables, up to 7-9 servings per day.  - Seatbelts can save your life. Wear them always.  - Smoke detectors on every level of your home, check batteries every year.  - Eye Doctor - have an eye exam every 1-2 years  - Safe sex - if you may be exposed to STDs, use a condom.  - Alcohol -  If you drink, do it moderately, less than 2 drinks per day.  - Health Care  Power of [employer]. Choose someone to speak for you if you are not able.  - Depression is common in our stressful world.If you're feeling down or losing interest in things you normally enjoy, please come in for a visit.  - Violence - If anyone is threatening or hurting you, please call immediately.    Encounter for immunization -     Flu vaccine HIGH DOSE PF(Fluzone Trivalent)    I have personally reviewed and noted the following in the patient's chart:   Medical and social history Use of alcohol, tobacco or illicit drugs  Current medications and supplements including opioid prescriptions. Patient is not currently taking opioid prescriptions. Functional ability and status Nutritional status Physical activity Advanced directives List of other physicians Hospitalizations, surgeries, and ER visits in previous 12 months Vitals Screenings to include cognitive, depression, and falls Referrals and appointments  In addition, I have reviewed and discussed with patient certain preventive protocols, quality metrics, and best practice recommendations. A written personalized care plan for preventive services as well as general preventive health  recommendations were provided to patient.

## 2023-10-22 ENCOUNTER — Other Ambulatory Visit: Payer: Self-pay | Admitting: Family Medicine

## 2023-11-12 ENCOUNTER — Other Ambulatory Visit: Payer: Self-pay | Admitting: Family Medicine

## 2023-11-12 DIAGNOSIS — E782 Mixed hyperlipidemia: Secondary | ICD-10-CM

## 2023-11-22 DIAGNOSIS — E785 Hyperlipidemia, unspecified: Secondary | ICD-10-CM | POA: Diagnosis not present

## 2023-11-22 DIAGNOSIS — M1990 Unspecified osteoarthritis, unspecified site: Secondary | ICD-10-CM | POA: Diagnosis not present

## 2023-11-22 DIAGNOSIS — N189 Chronic kidney disease, unspecified: Secondary | ICD-10-CM | POA: Diagnosis not present

## 2023-11-22 DIAGNOSIS — M8580 Other specified disorders of bone density and structure, unspecified site: Secondary | ICD-10-CM | POA: Diagnosis not present

## 2023-11-22 DIAGNOSIS — Z6837 Body mass index (BMI) 37.0-37.9, adult: Secondary | ICD-10-CM | POA: Diagnosis not present

## 2023-11-22 DIAGNOSIS — E1122 Type 2 diabetes mellitus with diabetic chronic kidney disease: Secondary | ICD-10-CM | POA: Diagnosis not present

## 2023-11-22 DIAGNOSIS — J301 Allergic rhinitis due to pollen: Secondary | ICD-10-CM | POA: Diagnosis not present

## 2023-11-22 DIAGNOSIS — Z8249 Family history of ischemic heart disease and other diseases of the circulatory system: Secondary | ICD-10-CM | POA: Diagnosis not present

## 2023-11-22 DIAGNOSIS — I7782 Antineutrophilic cytoplasmic antibody (ANCA) vasculitis: Secondary | ICD-10-CM | POA: Diagnosis not present

## 2023-11-22 DIAGNOSIS — I129 Hypertensive chronic kidney disease with stage 1 through stage 4 chronic kidney disease, or unspecified chronic kidney disease: Secondary | ICD-10-CM | POA: Diagnosis not present

## 2023-11-22 DIAGNOSIS — E1151 Type 2 diabetes mellitus with diabetic peripheral angiopathy without gangrene: Secondary | ICD-10-CM | POA: Diagnosis not present

## 2023-12-01 ENCOUNTER — Other Ambulatory Visit: Payer: Self-pay | Admitting: Family Medicine

## 2023-12-22 ENCOUNTER — Other Ambulatory Visit: Payer: Self-pay | Admitting: Family Medicine

## 2023-12-22 DIAGNOSIS — E1121 Type 2 diabetes mellitus with diabetic nephropathy: Secondary | ICD-10-CM

## 2023-12-25 ENCOUNTER — Ambulatory Visit: Admitting: Family Medicine

## 2023-12-25 ENCOUNTER — Encounter: Payer: Self-pay | Admitting: Family Medicine

## 2023-12-25 VITALS — BP 120/80 | HR 70 | Temp 97.1°F | Resp 16 | Ht <= 58 in | Wt 177.0 lb

## 2023-12-25 DIAGNOSIS — E782 Mixed hyperlipidemia: Secondary | ICD-10-CM

## 2023-12-25 DIAGNOSIS — I10 Essential (primary) hypertension: Secondary | ICD-10-CM

## 2023-12-25 DIAGNOSIS — I7782 Antineutrophilic cytoplasmic antibody (ANCA) vasculitis: Secondary | ICD-10-CM

## 2023-12-25 DIAGNOSIS — E1121 Type 2 diabetes mellitus with diabetic nephropathy: Secondary | ICD-10-CM

## 2023-12-25 DIAGNOSIS — M170 Bilateral primary osteoarthritis of knee: Secondary | ICD-10-CM | POA: Diagnosis not present

## 2023-12-25 LAB — POCT GLYCOSYLATED HEMOGLOBIN (HGB A1C): HbA1c POC (<> result, manual entry): 5.9 % (ref 4.0–5.6)

## 2023-12-25 LAB — POCT LIPID PANEL
HDL: 69
LDL: 29
Non-HDL: 45
TC/HDL: 0.4
TC: 114
TRG: 80

## 2023-12-25 MED ORDER — RYBELSUS 14 MG PO TABS: 1.0000 | ORAL_TABLET | Freq: Every day | ORAL | 1 refills | Status: AC

## 2023-12-25 MED ORDER — CETIRIZINE HCL 10 MG PO TABS: 10.0000 mg | ORAL_TABLET | Freq: Every day | ORAL | 1 refills | Status: AC

## 2023-12-25 NOTE — Assessment & Plan Note (Signed)
 SABRA

## 2023-12-25 NOTE — Assessment & Plan Note (Signed)
 Blood sugars well-controlled with current medication regimen. - Continue Rybelsus  14 mg daily, metformin  500 mg daily, and Jardiance  10 mg daily. - Monitor blood sugar levels regularly. - Call and get eye exam scheduled.  - Check feet daily.   Orders:   Semaglutide  (RYBELSUS ) 14 MG TABS; Take 1 tablet (14 mg total) by mouth daily.   POCT glycosylated hemoglobin (Hb A1C)   Microalbumin / creatinine urine ratio

## 2023-12-25 NOTE — Progress Notes (Signed)
 Subjective:  Patient ID: Julia Johns, female    DOB: Aug 25, 1950  Age: 73 y.o. MRN: [id number]  Chief Complaint  Patient presents with   Medical Management of Chronic Issues    HPI: Diabetes mellitus - Managed with Rybelsus  14 mg daily, metformin  500 mg daily, and Jardiance  10 mg daily.  - Blood sugar levels stable 100-115 - Eats fewer carbs.  - Exercising more.  - needs eye exam.  Allergic rhinitis and asthma - Uses cetirizine  daily  Hyperlipidemia - Rosuvastatin  10 mg before bed.   Hypertension  - Losartan  50 mg daily.  - Cough resolved with change from lisinopril  to losartan .       10/17/2023    7:56 AM 11/18/2022    7:58 AM 07/30/2022    7:50 AM 04/09/2022    8:09 AM 12/04/2021    7:46 AM  Depression screen PHQ 2/9  Decreased Interest 0 0 0 0 0  Down, Depressed, Hopeless 0 0 0 0 0  PHQ - 2 Score 0 0 0 0 0  Altered sleeping     0  Tired, decreased energy     0  Change in appetite     0  Feeling bad or failure about yourself      0  Trouble concentrating     0  Moving slowly or fidgety/restless     0  Suicidal thoughts     0  PHQ-9 Score     0   Difficult doing work/chores     Not difficult at all     Data saved with a previous flowsheet row definition        10/17/2023    7:56 AM  Fall Risk   Falls in the past year? 0  Number falls in past yr: 0  Injury with Fall? 0   Risk for fall due to : No Fall Risks  Follow up Falls evaluation completed     Data saved with a previous flowsheet row definition    Patient Care Team: Sherre Clapper, MD as PCP - General (Family Medicine) Mercie Landmark, MD as Referring Physician (Internal Medicine)   Review of Systems  Constitutional:  Negative for chills, fatigue and fever.  HENT:  Negative for congestion, ear pain and sore throat.   Respiratory:  Negative for cough and shortness of breath.   Cardiovascular:  Negative for chest pain.  Gastrointestinal:  Negative for abdominal pain, constipation,  diarrhea, nausea and vomiting.  Genitourinary:  Negative for dysuria and urgency.  Musculoskeletal:  Negative for arthralgias and myalgias.  Skin:  Negative for rash.  Neurological:  Negative for dizziness and headaches.  Psychiatric/Behavioral:  Negative for dysphoric mood. The patient is not nervous/anxious.     Current Outpatient Medications on File Prior to Visit  Medication Sig Dispense Refill   empagliflozin  (JARDIANCE ) 10 MG TABS tablet TAKE 1 TABLET BY MOUTH ONCE DAILY BEFORE BREAKFAST 90 tablet 1   glucose blood (ACCU-CHEK AVIVA PLUS) test strip Use as instructed 100 each 12   Lancet Device MISC Use as directed to check sugar daily. 1 each 0   losartan  (COZAAR ) 50 MG tablet Take 1 tablet (50 mg total) by mouth daily. 90 tablet 1   metFORMIN  (GLUCOPHAGE ) 500 MG tablet TAKE 1 TABLET BY MOUTH ONCE DAILY AFTER  THE  BIGGEST  MEAL 90 tablet 1   rosuvastatin  (CRESTOR ) 10 MG tablet Take 1 tablet by mouth once daily 90 tablet 0   No current facility-administered medications on file  prior to visit.   Past Medical History:  Diagnosis Date   Chronic kidney disease    Chronic respiratory failure with hypoxia (HCC)    10/09/2018-overnight oximetry- duration of sleep 7 hours and 34 minutes, on room air, SPO2 less than 88% 1 hour and 34 minutes and 28 seconds.    Diabetes mellitus without complication (HCC)    Grade II hemorrhoids 07/03/2017   Last Assessment & Plan:  Formatting of this note might be different from the original. Fiber supplement stool softener adequate hydration and local hemorrhoid management is advisable.   Vasculitis    Vasculitis, ANCA positive (HCC)    Past Surgical History:  Procedure Laterality Date   VIDEO BRONCHOSCOPY Bilateral 06/18/2018   Procedure: VIDEO BRONCHOSCOPY WITH FLUORO;  Surgeon: Jude Harden GAILS, MD;  Location[facility] ENDOSCOPY;  Service: Cardiopulmonary;  Laterality: Bilateral;   VIDEO BRONCHOSCOPY WITH ENDOBRONCHIAL ULTRASOUND N/A 06/25/2018   Procedure:  VIDEO BRONCHOSCOPY WITH ENDOBRONCHIAL ULTRASOUND;  Surgeon: Jude Harden GAILS, MD;  Location: [facility] OR;  Service: Thoracic;  Laterality: N/A;    Family History  Problem Relation Age of Onset   Uterine cancer Mother    Breast cancer Neg Hx    Social History   Socioeconomic History   Marital status: Single    Spouse name: Not on file   Number of children: Not on file   Years of education: Not on file   Highest education level: Not on file  Occupational History   Not on file  Tobacco Use   Smoking status: Never   Smokeless tobacco: Never  Vaping Use   Vaping status: Never Used  Substance and Sexual Activity   Alcohol use: Never   Drug use: Never   Sexual activity: Not Currently  Other Topics Concern   Not on file  Social History Narrative   Patient primary language is spanish, she lives at home with her son Eric Dolores and daughter in law [name]    Social Drivers of Health   Financial Resource Strain: Low Risk  (10/17/2023)   Overall Financial Resource Strain (CARDIA)    Difficulty of Paying Living Expenses: Not hard at all  Food Insecurity: No Food Insecurity (10/17/2023)   Hunger Vital Sign    Worried About Running Out of Food in the Last Year: Never true    Ran Out of Food in the Last Year: Never true  Transportation Needs: No Transportation Needs (10/17/2023)   PRAPARE - Administrator, Civil Service (Medical): No    Lack of Transportation (Non-Medical): No  Physical Activity: Sufficiently Active (10/17/2023)   Exercise Vital Sign    Days of Exercise per Week: 5 days    Minutes of Exercise per Session: 40 min  Stress: No Stress Concern Present (10/17/2023)   [employer] of Occupational Health - Occupational Stress Questionnaire    Feeling of Stress: Not at all  Social Connections: Socially Isolated (10/17/2023)   Social Connection and Isolation Panel    Frequency of Communication with Friends and Family: More than three times a week    Frequency  of Social Gatherings with Friends and Family: More than three times a week    Attends Religious Services: Never    Database Administrator or Organizations: No    Attends Banker Meetings: Never    Marital Status: Widowed    Objective:  BP 120/80   Pulse 70   Temp (!) 97.1 F (36.2 C)   Resp 16  Ht 4' 9 (1.448 m)   Wt 177 lb (80.3 kg)   SpO2 94%   BMI 38.30 kg/m      12/25/2023    8:06 AM 10/17/2023    7:51 AM 09/16/2023    8:45 AM  BP/Weight  Systolic BP 120 130 120  Diastolic BP 80 78 78  Wt. (Lbs) 177 180 182  BMI 38.3 kg/m2 38.95 kg/m2 39.38 kg/m2    Physical Exam Vitals reviewed.  Constitutional:      Appearance: Normal appearance. She is obese.  Neck:     Vascular: No carotid bruit.  Cardiovascular:     Rate and Rhythm: Normal rate and regular rhythm.     Pulses: Normal pulses.     Heart sounds: Normal heart sounds.  Pulmonary:     Effort: Pulmonary effort is normal. No respiratory distress.     Breath sounds: Normal breath sounds.  Abdominal:     General: Abdomen is flat. Bowel sounds are normal.     Palpations: Abdomen is soft.     Tenderness: There is no abdominal tenderness.  Neurological:     Mental Status: She is alert and oriented to person, place, and time.  Psychiatric:        Mood and Affect: Mood normal.        Behavior: Behavior normal.      Diabetic foot exam was performed with the following findings:   No deformities, ulcerations, or other skin breakdown Normal sensation of 10g monofilament Intact posterior tibialis and dorsalis pedis pulses      Lab Results  Component Value Date   WBC 7.4 09/16/2023   HGB 14.0 09/16/2023   HCT 44.8 09/16/2023   PLT 373 09/16/2023   GLUCOSE 82 09/16/2023   CHOL 124 09/16/2023   TRIG 133 09/16/2023   HDL 51 09/16/2023   LDLCALC 50 09/16/2023   ALT 13 09/16/2023   AST 14 09/16/2023   NA 140 09/16/2023   K 4.7 09/16/2023   CL 105 09/16/2023   CREATININE 0.64 09/16/2023   BUN  14 09/16/2023   CO2 19 (L) 09/16/2023   TSH 1.090 03/06/2023   INR 1.2 07/05/2018   HGBA1C 5.9 12/25/2023    Results for orders placed or performed in visit on 12/25/23  POCT Lipid Panel   Collection Time: 12/25/23  8:52 AM  Result Value Ref Range   TC 114    HDL 69    TRG 80    LDL 29    Non-HDL 45    TC/HDL 0.4   POCT glycosylated hemoglobin (Hb A1C)   Collection Time: 12/25/23  8:52 AM  Result Value Ref Range   Hemoglobin A1C     HbA1c POC (<> result, manual entry) 5.9 4.0 - 5.6 %   HbA1c, POC (prediabetic range)     HbA1c, POC (controlled diabetic range)    Microalbumin / creatinine urine ratio   Collection Time: 12/25/23 10:51 AM  Result Value Ref Range   Creatinine, Urine 22.2 Not Estab. mg/dL   Microalbumin, Urine 80.9 Not Estab. ug/mL   Microalb/Creat Ratio 86 (H) 0 - 29 mg/g creat  .  Assessment & Plan:   Assessment & Plan Diabetic glomerulopathy (HCC) Blood sugars well-controlled with current medication regimen. - Continue Rybelsus  14 mg daily, metformin  500 mg daily, and Jardiance  10 mg daily. - Monitor blood sugar levels regularly. - Call and get eye exam scheduled.  - Check feet daily.   Orders:   Semaglutide  (RYBELSUS ) 14 MG  TABS; Take 1 tablet (14 mg total) by mouth daily.   POCT glycosylated hemoglobin (Hb A1C)   Microalbumin / creatinine urine ratio   Mixed hyperlipidemia Well controlled.  No changes to medicines. Rosuvastatin  10 mg daily. Continue to work on eating a healthy diet and exercise.   Orders:   POCT Lipid Panel   ANCA-associated vasculitis (HCC) Well controlled.  Sees nephrology every 6 months.     Primary osteoarthritis of both knees OTC NSAID.     Essential hypertension, benign Well controlled.  Continue losartan  50 mg daily.       Body mass index is 38.3 kg/m.   Meds ordered this encounter  Medications   Semaglutide  (RYBELSUS ) 14 MG TABS    Sig: Take 1 tablet (14 mg total) by mouth daily.    Dispense:  90  tablet    Refill:  1   cetirizine  (ZYRTEC ) 10 MG tablet    Sig: Take 1 tablet (10 mg total) by mouth daily.    Dispense:  90 tablet    Refill:  1    Orders Placed This Encounter  Procedures   Microalbumin / creatinine urine ratio   POCT Lipid Panel   POCT glycosylated hemoglobin (Hb A1C)       Follow-up: Return in about 3 months (around 03/24/2024) for chronic follow up.  An After Visit Summary was printed and given to the patient.  Abigail Free, MD [facility] [phone]

## 2023-12-26 LAB — MICROALBUMIN / CREATININE URINE RATIO
Creatinine, Urine: 22.2 mg/dL
Microalb/Creat Ratio: 86 mg/g{creat} — ABNORMAL HIGH (ref 0–29)
Microalbumin, Urine: 19 ug/mL

## 2023-12-28 ENCOUNTER — Ambulatory Visit: Payer: Self-pay | Admitting: Family Medicine

## 2023-12-28 NOTE — Assessment & Plan Note (Signed)
 Well controlled.  Sees nephrology every 6 months.

## 2023-12-28 NOTE — Assessment & Plan Note (Signed)
Well-controlled.  Continue losartan 50 mg daily. ?

## 2023-12-28 NOTE — Assessment & Plan Note (Signed)
 OTC NSAID.

## 2023-12-29 MED ORDER — EMPAGLIFLOZIN 25 MG PO TABS: 25.0000 mg | ORAL_TABLET | Freq: Every day | ORAL | 3 refills | Status: AC

## 2024-01-11 ENCOUNTER — Other Ambulatory Visit: Payer: Self-pay | Admitting: Family Medicine

## 2024-01-11 DIAGNOSIS — I152 Hypertension secondary to endocrine disorders: Secondary | ICD-10-CM

## 2024-02-09 ENCOUNTER — Other Ambulatory Visit: Payer: Self-pay | Admitting: Family Medicine

## 2024-02-09 DIAGNOSIS — E782 Mixed hyperlipidemia: Secondary | ICD-10-CM

## 2024-03-30 ENCOUNTER — Ambulatory Visit: Admitting: Family Medicine

## 2024-04-13 ENCOUNTER — Ambulatory Visit: Admitting: Family Medicine
